# Patient Record
Sex: Female | Born: 1975 | Race: White | Hispanic: No | Marital: Single | Smoking: Current every day smoker
Health system: Southern US, Community
[De-identification: ages and names within clinical notes are randomized; demographics above are authoritative.]

## PROBLEM LIST (undated history)

## (undated) ENCOUNTER — Ambulatory Visit: Disposition: A | Discharge status: Home / Self Care | Payer: 59

## (undated) DIAGNOSIS — H6690 Otitis media, unspecified, unspecified ear: Secondary | ICD-10-CM

## (undated) DIAGNOSIS — F411 Generalized anxiety disorder: Secondary | ICD-10-CM

## (undated) DIAGNOSIS — J0120 Acute ethmoidal sinusitis, unspecified: Secondary | ICD-10-CM

## (undated) DIAGNOSIS — B338 Other specified viral diseases: Secondary | ICD-10-CM

## (undated) DIAGNOSIS — D518 Other vitamin B12 deficiency anemias: Secondary | ICD-10-CM

## (undated) DIAGNOSIS — D510 Vitamin B12 deficiency anemia due to intrinsic factor deficiency: Secondary | ICD-10-CM

## (undated) DIAGNOSIS — I10 Essential (primary) hypertension: Secondary | ICD-10-CM

## (undated) DIAGNOSIS — R5383 Other fatigue: Secondary | ICD-10-CM

## (undated) DIAGNOSIS — E785 Hyperlipidemia, unspecified: Secondary | ICD-10-CM

## (undated) DIAGNOSIS — R221 Localized swelling, mass and lump, neck: Secondary | ICD-10-CM

## (undated) DIAGNOSIS — J069 Acute upper respiratory infection, unspecified: Secondary | ICD-10-CM

## (undated) DIAGNOSIS — E162 Hypoglycemia, unspecified: Secondary | ICD-10-CM

## (undated) DIAGNOSIS — M79609 Pain in unspecified limb: Secondary | ICD-10-CM

## (undated) DIAGNOSIS — J441 Chronic obstructive pulmonary disease with (acute) exacerbation: Secondary | ICD-10-CM

## (undated) DIAGNOSIS — F329 Major depressive disorder, single episode, unspecified: Secondary | ICD-10-CM

## (undated) DIAGNOSIS — I2510 Atherosclerotic heart disease of native coronary artery without angina pectoris: Secondary | ICD-10-CM

## (undated) DIAGNOSIS — R112 Nausea with vomiting, unspecified: Secondary | ICD-10-CM

## (undated) DIAGNOSIS — J0100 Acute maxillary sinusitis, unspecified: Secondary | ICD-10-CM

## (undated) DIAGNOSIS — R079 Chest pain, unspecified: Secondary | ICD-10-CM

## (undated) DIAGNOSIS — M545 Low back pain, unspecified: Secondary | ICD-10-CM

## (undated) DIAGNOSIS — H6190 Disorder of external ear, unspecified, unspecified ear: Secondary | ICD-10-CM

## (undated) DIAGNOSIS — H81319 Aural vertigo, unspecified ear: Secondary | ICD-10-CM

## (undated) DIAGNOSIS — R197 Diarrhea, unspecified: Secondary | ICD-10-CM

## (undated) DIAGNOSIS — G4736 Sleep related hypoventilation in conditions classified elsewhere: Secondary | ICD-10-CM

## (undated) DIAGNOSIS — I471 Supraventricular tachycardia, unspecified: Secondary | ICD-10-CM

## (undated) DIAGNOSIS — K219 Gastro-esophageal reflux disease without esophagitis: Secondary | ICD-10-CM

## (undated) DIAGNOSIS — F32A Depression, unspecified: Secondary | ICD-10-CM

## (undated) DIAGNOSIS — I509 Heart failure, unspecified: Secondary | ICD-10-CM

## (undated) DIAGNOSIS — M7150 Other bursitis, not elsewhere classified, unspecified site: Secondary | ICD-10-CM

## (undated) DIAGNOSIS — R5381 Other malaise: Secondary | ICD-10-CM

## (undated) DIAGNOSIS — L989 Disorder of the skin and subcutaneous tissue, unspecified: Secondary | ICD-10-CM

## (undated) DIAGNOSIS — F17200 Nicotine dependence, unspecified, uncomplicated: Secondary | ICD-10-CM

## (undated) DIAGNOSIS — R220 Localized swelling, mass and lump, head: Secondary | ICD-10-CM

## (undated) HISTORY — DX: Chest pain, unspecified: R07.9

## (undated) HISTORY — DX: Acute ethmoidal sinusitis, unspecified: J01.20

## (undated) HISTORY — DX: Vitamin B12 deficiency anemia due to intrinsic factor deficiency: D51.0

## (undated) HISTORY — DX: Other vitamin B12 deficiency anemias: D51.8

## (undated) HISTORY — DX: Nausea with vomiting, unspecified: R11.2

## (undated) HISTORY — DX: Major depressive disorder, single episode, unspecified: F32.9

## (undated) HISTORY — DX: Atherosclerotic heart disease of native coronary artery without angina pectoris: I25.10

## (undated) HISTORY — DX: Otitis media, unspecified, unspecified ear: H66.90

## (undated) HISTORY — DX: Aural vertigo, unspecified ear: H81.319

## (undated) HISTORY — DX: Other bursitis, not elsewhere classified, unspecified site: M71.50

## (undated) HISTORY — DX: Other specified viral diseases: B33.8

## (undated) HISTORY — DX: Other fatigue: R53.83

## (undated) HISTORY — DX: Supraventricular tachycardia: I47.1

## (undated) HISTORY — DX: Essential (primary) hypertension: I10

## (undated) HISTORY — DX: Localized swelling, mass and lump, neck: R22.1

## (undated) HISTORY — DX: Disorder of the skin and subcutaneous tissue, unspecified: L98.9

## (undated) HISTORY — DX: Sleep related hypoventilation in conditions classified elsewhere: G47.36

## (undated) HISTORY — DX: Generalized anxiety disorder: F41.1

## (undated) HISTORY — DX: Other malaise: R53.81

## (undated) HISTORY — DX: Heart failure, unspecified: I50.9

## (undated) HISTORY — DX: Low back pain, unspecified: M54.50

## (undated) HISTORY — DX: Disorder of external ear, unspecified, unspecified ear: H61.90

## (undated) HISTORY — DX: Acute maxillary sinusitis, unspecified: J01.00

## (undated) HISTORY — DX: Depression, unspecified: F32.A

## (undated) HISTORY — DX: Pain in unspecified limb: M79.609

## (undated) HISTORY — DX: Supraventricular tachycardia, unspecified: I47.10

## (undated) HISTORY — DX: Chronic obstructive pulmonary disease with (acute) exacerbation: J44.1

## (undated) HISTORY — DX: Nicotine dependence, unspecified, uncomplicated: F17.200

## (undated) HISTORY — DX: Diarrhea, unspecified: R19.7

## (undated) HISTORY — DX: Localized swelling, mass and lump, head: R22.0

## (undated) HISTORY — DX: Hypoglycemia, unspecified: E16.2

## (undated) HISTORY — DX: Gastro-esophageal reflux disease without esophagitis: K21.9

## (undated) HISTORY — DX: Low back pain: M54.5

## (undated) HISTORY — DX: Hyperlipidemia, unspecified: E78.5

## (undated) HISTORY — DX: Acute upper respiratory infection, unspecified: J06.9

---

## 2005-01-07 ENCOUNTER — Emergency Department: Payer: Self-pay | Admitting: General Practice

## 2005-08-25 ENCOUNTER — Ambulatory Visit: Payer: Self-pay | Admitting: Family Medicine

## 2005-10-14 ENCOUNTER — Emergency Department: Payer: Self-pay | Admitting: Emergency Medicine

## 2006-01-18 ENCOUNTER — Emergency Department: Payer: Self-pay | Admitting: Unknown Physician Specialty

## 2006-07-21 ENCOUNTER — Emergency Department: Payer: Self-pay | Admitting: Emergency Medicine

## 2007-10-29 ENCOUNTER — Emergency Department: Payer: Self-pay | Admitting: Internal Medicine

## 2008-01-23 ENCOUNTER — Ambulatory Visit: Payer: Self-pay

## 2008-06-23 ENCOUNTER — Ambulatory Visit: Payer: Self-pay | Admitting: Family Medicine

## 2008-07-30 ENCOUNTER — Emergency Department: Payer: Self-pay | Admitting: Emergency Medicine

## 2008-08-05 ENCOUNTER — Encounter: Payer: Self-pay | Admitting: Emergency Medicine

## 2008-08-06 ENCOUNTER — Encounter: Payer: Self-pay | Admitting: Emergency Medicine

## 2008-08-11 ENCOUNTER — Emergency Department: Payer: Self-pay | Admitting: Emergency Medicine

## 2008-08-13 ENCOUNTER — Emergency Department: Payer: Self-pay | Admitting: Emergency Medicine

## 2008-09-05 ENCOUNTER — Encounter: Payer: Self-pay | Admitting: Emergency Medicine

## 2008-09-23 ENCOUNTER — Ambulatory Visit: Payer: Self-pay | Admitting: Family

## 2008-10-13 ENCOUNTER — Other Ambulatory Visit: Payer: Self-pay | Admitting: Family

## 2008-12-10 ENCOUNTER — Other Ambulatory Visit: Payer: Self-pay | Admitting: Family

## 2009-04-27 ENCOUNTER — Emergency Department: Payer: Self-pay | Admitting: Emergency Medicine

## 2009-05-28 ENCOUNTER — Emergency Department: Payer: Self-pay | Admitting: Emergency Medicine

## 2009-07-12 ENCOUNTER — Ambulatory Visit: Payer: Self-pay | Admitting: Family

## 2010-02-05 ENCOUNTER — Emergency Department: Payer: Self-pay | Admitting: Emergency Medicine

## 2010-04-12 ENCOUNTER — Emergency Department: Payer: Self-pay | Admitting: Emergency Medicine

## 2010-04-25 ENCOUNTER — Ambulatory Visit: Payer: Self-pay | Admitting: Otolaryngology

## 2010-06-02 ENCOUNTER — Emergency Department: Payer: Self-pay | Admitting: Internal Medicine

## 2010-09-21 ENCOUNTER — Ambulatory Visit: Payer: Self-pay | Admitting: Internal Medicine

## 2010-10-17 ENCOUNTER — Ambulatory Visit: Payer: Self-pay | Admitting: Internal Medicine

## 2010-11-22 ENCOUNTER — Ambulatory Visit: Payer: Self-pay | Admitting: Otolaryngology

## 2010-12-06 ENCOUNTER — Ambulatory Visit: Payer: Self-pay | Admitting: Otolaryngology

## 2010-12-19 ENCOUNTER — Ambulatory Visit: Payer: Self-pay | Admitting: Otolaryngology

## 2010-12-22 LAB — PATHOLOGY REPORT

## 2011-01-20 ENCOUNTER — Ambulatory Visit (INDEPENDENT_AMBULATORY_CARE_PROVIDER_SITE_OTHER): Payer: Medicaid Other | Admitting: Internal Medicine

## 2011-01-20 DIAGNOSIS — E538 Deficiency of other specified B group vitamins: Secondary | ICD-10-CM

## 2011-01-20 MED ORDER — CYANOCOBALAMIN 1000 MCG/ML IJ SOLN
1000.0000 ug | Freq: Once | INTRAMUSCULAR | Status: AC
  Administered 2011-01-20: 1000 ug via INTRAMUSCULAR

## 2011-01-21 ENCOUNTER — Encounter: Payer: Self-pay | Admitting: Internal Medicine

## 2011-02-15 ENCOUNTER — Encounter: Payer: Self-pay | Admitting: Internal Medicine

## 2011-03-17 ENCOUNTER — Ambulatory Visit: Payer: Self-pay | Admitting: Orthopedic Surgery

## 2011-03-20 ENCOUNTER — Ambulatory Visit: Payer: Self-pay | Admitting: Orthopedic Surgery

## 2011-04-25 ENCOUNTER — Ambulatory Visit: Payer: Self-pay | Admitting: Orthopedic Surgery

## 2011-07-19 DIAGNOSIS — E538 Deficiency of other specified B group vitamins: Secondary | ICD-10-CM | POA: Insufficient documentation

## 2011-07-19 DIAGNOSIS — F32A Depression, unspecified: Secondary | ICD-10-CM | POA: Insufficient documentation

## 2011-07-19 DIAGNOSIS — K219 Gastro-esophageal reflux disease without esophagitis: Secondary | ICD-10-CM | POA: Insufficient documentation

## 2011-07-19 DIAGNOSIS — F411 Generalized anxiety disorder: Secondary | ICD-10-CM | POA: Insufficient documentation

## 2011-07-19 DIAGNOSIS — M545 Low back pain, unspecified: Secondary | ICD-10-CM | POA: Insufficient documentation

## 2011-07-20 ENCOUNTER — Ambulatory Visit: Payer: Self-pay | Admitting: Internal Medicine

## 2011-09-06 ENCOUNTER — Emergency Department: Payer: Self-pay

## 2011-09-06 LAB — TSH: Thyroid Stimulating Horm: 2.64 u[IU]/mL

## 2011-09-06 LAB — CBC
HGB: 15.1 g/dL (ref 12.0–16.0)
MCH: 31.7 pg (ref 26.0–34.0)
MCHC: 33.8 g/dL (ref 32.0–36.0)
MCV: 94 fL (ref 80–100)
Platelet: 317 10*3/uL (ref 150–440)
RBC: 4.77 10*6/uL (ref 3.80–5.20)

## 2011-09-06 LAB — TROPONIN I: Troponin-I: <0.02 ng/mL

## 2011-09-06 LAB — BASIC METABOLIC PANEL
BUN: 6 mg/dL — ABNORMAL LOW (ref 7–18)
Co2: 24 mmol/L (ref 21–32)
Creatinine: 0.73 mg/dL (ref 0.60–1.30)
EGFR (Non-African Amer.): >60
Glucose: 134 mg/dL — ABNORMAL HIGH (ref 65–99)
Osmolality: 277 (ref 275–301)
Sodium: 139 mmol/L (ref 136–145)

## 2011-09-06 LAB — CK TOTAL AND CKMB (NOT AT ARMC): CK-MB: <0.5 ng/mL — ABNORMAL LOW (ref 0.5–3.6)

## 2011-10-04 ENCOUNTER — Other Ambulatory Visit: Payer: Self-pay | Admitting: Internal Medicine

## 2011-10-04 NOTE — Telephone Encounter (Signed)
Patient has not been seen here.  Please advise.

## 2011-11-08 ENCOUNTER — Emergency Department: Payer: Self-pay | Admitting: Emergency Medicine

## 2011-11-12 ENCOUNTER — Emergency Department: Payer: Self-pay | Admitting: Internal Medicine

## 2011-11-29 ENCOUNTER — Other Ambulatory Visit: Payer: Self-pay | Admitting: Specialist

## 2011-11-29 LAB — CBC WITH DIFFERENTIAL/PLATELET
Basophil #: 0 10*3/uL (ref 0.0–0.1)
Eosinophil #: 0.1 10*3/uL (ref 0.0–0.7)
Eosinophil %: 1.1 %
HCT: 44.4 % (ref 35.0–47.0)
MCH: 31.1 pg (ref 26.0–34.0)
MCV: 95 fL (ref 80–100)
Platelet: 274 10*3/uL (ref 150–440)
RBC: 4.7 10*6/uL (ref 3.80–5.20)
WBC: 10.5 10*3/uL (ref 3.6–11.0)

## 2011-11-29 LAB — COMPREHENSIVE METABOLIC PANEL
Albumin: 3.9 g/dL (ref 3.4–5.0)
Alkaline Phosphatase: 85 U/L (ref 50–136)
BUN: 7 mg/dL (ref 7–18)
Bilirubin,Total: 0.5 mg/dL (ref 0.2–1.0)
Calcium, Total: 8.7 mg/dL (ref 8.5–10.1)
EGFR (Non-African Amer.): >60
Glucose: 81 mg/dL (ref 65–99)
Osmolality: 273 (ref 275–301)
Potassium: 3.7 mmol/L (ref 3.5–5.1)
SGPT (ALT): 22 U/L
Sodium: 138 mmol/L (ref 136–145)

## 2011-11-29 LAB — LIPID PANEL: VLDL Cholesterol, Calc: 37 mg/dL (ref 5–40)

## 2011-12-18 ENCOUNTER — Encounter: Payer: Self-pay | Admitting: Specialist

## 2012-01-07 ENCOUNTER — Encounter: Payer: Self-pay | Admitting: Specialist

## 2012-01-17 ENCOUNTER — Emergency Department: Payer: Self-pay | Admitting: Internal Medicine

## 2012-01-17 DIAGNOSIS — M797 Fibromyalgia: Secondary | ICD-10-CM | POA: Insufficient documentation

## 2012-01-17 DIAGNOSIS — IMO0001 Reserved for inherently not codable concepts without codable children: Secondary | ICD-10-CM | POA: Insufficient documentation

## 2012-01-23 ENCOUNTER — Ambulatory Visit: Payer: Self-pay | Admitting: Specialist

## 2012-02-10 ENCOUNTER — Other Ambulatory Visit: Payer: Self-pay | Admitting: Otolaryngology

## 2012-02-12 ENCOUNTER — Ambulatory Visit: Payer: Self-pay | Admitting: Pain Medicine

## 2012-02-15 ENCOUNTER — Ambulatory Visit: Payer: Self-pay | Admitting: Pain Medicine

## 2012-02-23 DIAGNOSIS — R110 Nausea: Secondary | ICD-10-CM | POA: Insufficient documentation

## 2012-03-04 ENCOUNTER — Ambulatory Visit: Payer: Self-pay | Admitting: Pain Medicine

## 2012-03-06 ENCOUNTER — Encounter: Payer: Self-pay | Admitting: Internal Medicine

## 2012-03-21 DIAGNOSIS — H00019 Hordeolum externum unspecified eye, unspecified eyelid: Secondary | ICD-10-CM | POA: Insufficient documentation

## 2012-04-15 ENCOUNTER — Other Ambulatory Visit: Payer: Self-pay | Admitting: Specialist

## 2012-04-15 LAB — HEPATIC FUNCTION PANEL A (ARMC)
Alkaline Phosphatase: 89 U/L (ref 50–136)
Bilirubin, Direct: <0.1 mg/dL (ref 0.00–0.20)
SGPT (ALT): 27 U/L (ref 12–78)

## 2012-06-18 DIAGNOSIS — IMO0001 Reserved for inherently not codable concepts without codable children: Secondary | ICD-10-CM | POA: Insufficient documentation

## 2012-06-18 DIAGNOSIS — R5383 Other fatigue: Secondary | ICD-10-CM | POA: Insufficient documentation

## 2012-07-08 ENCOUNTER — Ambulatory Visit: Payer: Self-pay | Admitting: Pain Medicine

## 2012-07-29 DIAGNOSIS — I499 Cardiac arrhythmia, unspecified: Secondary | ICD-10-CM | POA: Insufficient documentation

## 2012-07-29 DIAGNOSIS — Z309 Encounter for contraceptive management, unspecified: Secondary | ICD-10-CM | POA: Insufficient documentation

## 2012-07-29 DIAGNOSIS — A184 Tuberculosis of skin and subcutaneous tissue: Secondary | ICD-10-CM | POA: Insufficient documentation

## 2012-08-03 ENCOUNTER — Ambulatory Visit: Payer: Self-pay | Admitting: Internal Medicine

## 2012-08-04 ENCOUNTER — Ambulatory Visit: Payer: Self-pay | Admitting: Internal Medicine

## 2012-08-05 LAB — CLOSTRIDIUM DIFFICILE BY PCR

## 2012-08-07 LAB — STOOL CULTURE

## 2012-08-12 ENCOUNTER — Ambulatory Visit: Payer: Self-pay | Admitting: Otolaryngology

## 2012-08-14 ENCOUNTER — Ambulatory Visit: Payer: Self-pay | Admitting: Otolaryngology

## 2012-09-11 DIAGNOSIS — R1313 Dysphagia, pharyngeal phase: Secondary | ICD-10-CM | POA: Insufficient documentation

## 2013-02-03 DIAGNOSIS — T148XXA Other injury of unspecified body region, initial encounter: Secondary | ICD-10-CM | POA: Insufficient documentation

## 2013-02-03 DIAGNOSIS — M25519 Pain in unspecified shoulder: Secondary | ICD-10-CM | POA: Insufficient documentation

## 2013-02-03 DIAGNOSIS — M25569 Pain in unspecified knee: Secondary | ICD-10-CM | POA: Insufficient documentation

## 2013-02-03 DIAGNOSIS — M25562 Pain in left knee: Secondary | ICD-10-CM | POA: Insufficient documentation

## 2013-08-04 DIAGNOSIS — G5600 Carpal tunnel syndrome, unspecified upper limb: Secondary | ICD-10-CM | POA: Insufficient documentation

## 2013-08-04 DIAGNOSIS — M7710 Lateral epicondylitis, unspecified elbow: Secondary | ICD-10-CM | POA: Insufficient documentation

## 2013-10-24 DIAGNOSIS — M26629 Arthralgia of temporomandibular joint, unspecified side: Secondary | ICD-10-CM | POA: Insufficient documentation

## 2014-04-15 ENCOUNTER — Ambulatory Visit: Payer: Self-pay | Admitting: Family Medicine

## 2014-08-05 ENCOUNTER — Ambulatory Visit
Admit: 2014-08-05 | Disposition: A | Discharge status: Home / Self Care | Payer: Self-pay | Attending: Cardiovascular Disease | Admitting: Cardiovascular Disease

## 2014-08-06 ENCOUNTER — Telehealth: Payer: Self-pay | Admitting: Internal Medicine

## 2014-09-16 NOTE — Telephone Encounter (Signed)
Message sent

## 2014-11-03 DIAGNOSIS — Z79899 Other long term (current) drug therapy: Secondary | ICD-10-CM | POA: Insufficient documentation

## 2014-12-23 ENCOUNTER — Other Ambulatory Visit: Payer: Self-pay

## 2014-12-23 ENCOUNTER — Encounter: Payer: Self-pay | Admitting: Emergency Medicine

## 2014-12-23 ENCOUNTER — Emergency Department: Payer: No Typology Code available for payment source

## 2014-12-23 ENCOUNTER — Emergency Department
Admission: EM | Admit: 2014-12-23 | Discharge: 2014-12-23 | Disposition: A | Discharge status: Home / Self Care | Payer: No Typology Code available for payment source | Attending: Student | Admitting: Student

## 2014-12-23 DIAGNOSIS — Z79811 Long term (current) use of aromatase inhibitors: Secondary | ICD-10-CM | POA: Diagnosis not present

## 2014-12-23 DIAGNOSIS — Z720 Tobacco use: Secondary | ICD-10-CM | POA: Insufficient documentation

## 2014-12-23 DIAGNOSIS — F419 Anxiety disorder, unspecified: Secondary | ICD-10-CM | POA: Diagnosis not present

## 2014-12-23 DIAGNOSIS — Z7951 Long term (current) use of inhaled steroids: Secondary | ICD-10-CM | POA: Insufficient documentation

## 2014-12-23 DIAGNOSIS — R0789 Other chest pain: Secondary | ICD-10-CM | POA: Diagnosis not present

## 2014-12-23 DIAGNOSIS — Z79899 Other long term (current) drug therapy: Secondary | ICD-10-CM | POA: Diagnosis not present

## 2014-12-23 DIAGNOSIS — E785 Hyperlipidemia, unspecified: Secondary | ICD-10-CM | POA: Insufficient documentation

## 2014-12-23 DIAGNOSIS — R071 Chest pain on breathing: Secondary | ICD-10-CM

## 2014-12-23 DIAGNOSIS — F329 Major depressive disorder, single episode, unspecified: Secondary | ICD-10-CM | POA: Diagnosis not present

## 2014-12-23 MED ORDER — KETOROLAC TROMETHAMINE 10 MG PO TABS: 10.0000 mg | ORAL_TABLET | Freq: Four times a day (QID) | ORAL | Status: DC | PRN

## 2014-12-23 MED ORDER — KETOROLAC TROMETHAMINE 60 MG/2ML IM SOLN
60.0000 mg | Freq: Once | INTRAMUSCULAR | Status: AC
  Administered 2014-12-23: 60 mg via INTRAMUSCULAR
  Filled 2014-12-23: qty 2

## 2014-12-23 MED ORDER — PSEUDOEPH-BROMPHEN-DM 30-2-10 MG/5ML PO SYRP: 5.0000 mL | ORAL_SOLUTION | Freq: Four times a day (QID) | ORAL | Status: DC | PRN

## 2014-12-23 MED ORDER — ORPHENADRINE CITRATE 30 MG/ML IJ SOLN
60.0000 mg | Freq: Two times a day (BID) | INTRAMUSCULAR | Status: DC
  Administered 2014-12-23: 60 mg via INTRAMUSCULAR
  Filled 2014-12-23: qty 2

## 2014-12-23 MED ORDER — TRAMADOL HCL 50 MG PO TABS: 50.0000 mg | ORAL_TABLET | Freq: Four times a day (QID) | ORAL | Status: AC | PRN

## 2014-12-23 NOTE — ED Provider Notes (Signed)
[facility] Emergency Department Provider Note  ____________________________________________  Time seen: Approximately 9:17 AM  I have reviewed the triage vital signs and the nursing notes.   HISTORY  Chief Complaint Chest Pain   HPI Lisa Davila is a 39 y.o. female complaining Center chest wall pain for 1 day. Patient states pain increase with deep breathing and movement. Patient states she's has also coughing for the last 2-3 days. Patient denies any dyspnea or radiating pain in the center of her chest. Patient rated the pain as a sharp 7/10.   Past Medical History  Diagnosis Date  . Hyperlipidemia   . Depression   . Nausea with vomiting   . Pain in limb   . Hypoglycemia, unspecified   . Tobacco use disorder   . Sleep related hypoventilation/hypoxemia in conditions classifiable elsewhere   . Swelling, mass, or lump in head and neck   . Other and unspecified peripheral vertigo(386.19)   . Unspecified otitis media   . Diarrhea   . Esophageal reflux   . Acute maxillary sinusitis   . Acute upper respiratory infections of unspecified site   . Unspecified disorder of external ear   . Other specified diseases due to viruses   . Unspecified disorder of skin and subcutaneous tissue   . Other malaise and fatigue   . Anxiety state, unspecified   . Other vitamin B12 deficiency anemia   . Other bursitis disorders   . Acute ethmoidal sinusitis   . Acute ethmoidal sinusitis   . Lumbago   . Pernicious anemia   . Obstructive chronic bronchitis with exacerbation   . Generalized anxiety disorder   . Other and unspecified hyperlipidemia   . Chest pain, unspecified     There are no active problems to display for this patient.   History reviewed. No pertinent past surgical history.  Current Outpatient Rx  Name  Route  Sig  Dispense  Refill  . albuterol (PROVENTIL HFA;VENTOLIN HFA) 108 (90 BASE) MCG/ACT inhaler   Inhalation   Inhale 2 puffs into the  lungs every 6 (six) hours as needed.           . ALPRAZolam (XANAX) 0.5 MG tablet   Oral   Take 0.5 mg by mouth 2 (two) times daily.           . brompheniramine-pseudoephedrine-DM 30-2-10 MG/5ML syrup   Oral   Take 5 mLs by mouth 4 (four) times daily as needed.   120 mL   0   . escitalopram (LEXAPRO) 10 MG tablet   Oral   Take 10 mg by mouth daily.           . Fluticasone-Salmeterol (ADVAIR) 100-50 MCG/DOSE AEPB   Inhalation   Inhale 1 puff into the lungs 2 (two) times daily.           Marland Kitchen ketorolac (TORADOL) 10 MG tablet   Oral   Take 1 tablet (10 mg total) by mouth every 6 (six) hours as needed.   20 tablet   0   . MedroxyPROGESTERone Acetate (DEPO-PROVERA IM)   Intramuscular   Inject into the muscle.           . rosuvastatin (CRESTOR) 20 MG tablet   Oral   Take 20 mg by mouth daily.           . traMADol (ULTRAM) 50 MG tablet   Oral   Take 1 tablet (50 mg total) by mouth every 6 (six) hours as needed.  20 tablet   0     Allergies Sulfa drugs cross reactors  Family History  Problem Relation Age of Onset  . Depression Mother   . Cancer Father     Father  . Hypertension Sister   . Cancer Paternal Grandfather     Breast     Social History Social History  Substance Use Topics  . Smoking status: Current Every Day Smoker  . Smokeless tobacco: None     Comment: Has been smoking for 20 years  . Alcohol Use: No    Review of Systems Constitutional: No fever/chills Eyes: No visual changes. ENT: No sore throat. Cardiovascular:  Respiratory: Denies shortness of breath. Gastrointestinal: No abdominal pain.  No nausea, no vomiting.  No diarrhea.  No constipation. Nonproductive cough Genitourinary: Negative for dysuria. Musculoskeletal: Chest wall pain  Skin: Negative for rash. Neurological: Negative for headaches, focal weakness or numbness. Psychiatric:Anxiety and  depression Endocrine:Hyperlipidemia Hematological/Lymphatic: Allergic/Immunilogical: See medication list 10-point ROS otherwise negative.  ____________________________________________   PHYSICAL EXAM:  VITAL SIGNS: ED Triage Vitals  Enc Vitals Group     BP 12/23/14 0812 115/72 mmHg     Pulse Rate 12/23/14 0812 86     Resp 12/23/14 0812 16     Temp 12/23/14 0812 98 F (36.7 C)     Temp Source 12/23/14 0812 Oral     SpO2 12/23/14 0812 100 %     Weight 12/23/14 0812 130 lb (58.968 kg)     Height 12/23/14 0812 5\' 1"  (1.549 m)     Head Cir --      Peak Flow --      Pain Score --      Pain Loc --      Pain Edu? --      Excl. in [place]? --     Constitutional: Alert and oriented. Well appearing and in no acute distress. Eyes: Conjunctivae are normal. PERRL. EOMI. Head: Atraumatic. Nose: No congestion/rhinnorhea. Mouth/Throat: Mucous membranes are moist.  Oropharynx non-erythematous. Neck: No stridor.  No cervical spine tenderness to palpation. Hematological/Lymphatic/Immunilogical: No cervical lymphadenopathy. Cardiovascular: Normal rate, regular rhythm. Grossly normal heart sounds.  Good peripheral circulation. Respiratory: Normal respiratory effort.  No retractions. Lungs CTAB. Gastrointestinal: Soft and nontender. No distention. No abdominal bruits. No CVA tenderness. Musculoskeletal: No lower extremity tenderness nor edema.  No joint effusions. Neurologic:  Normal speech and language. No gross focal neurologic deficits are appreciated. No gait instability. Skin:  Skin is warm, dry and intact. No rash noted. Psychiatric: Mood and affect are normal. Speech and behavior are normal.  ____________________________________________   LABS (all labs ordered are listed, but only abnormal results are displayed)  Labs Reviewed - No data to display ____________________________________________  EKG   ____________________________________________  RADIOLOGY  No acute  findings. ____________________________________________   PROCEDURES  Procedure(s) performed: None  Critical Care performed: No  ____________________________________________   INITIAL IMPRESSION / ASSESSMENT AND PLAN / ED COURSE  Pertinent labs & imaging results that were available during my care of the patient were reviewed by me and considered in my medical decision making (see chart for details). Costochrondal chest pain. Patient state remarkable relief with Toradol and Norflex. Patient given prescription for this medication oral form. Patient advised follow-up family doctor. Return by ER for condition worsens. ____________________________________________   FINAL CLINICAL IMPRESSION(S) / ED DIAGNOSES  Final diagnoses:  Costochondral chest pain      Sable Feil, PA-C 12/23/14 3545  Joanne Gavel, MD 12/23/14 [phone]

## 2014-12-23 NOTE — Discharge Instructions (Signed)
Advised lap belt for 2-3 days. Costochondritis Costochondritis, sometimes called Tietze syndrome, is a swelling and irritation (inflammation) of the tissue (cartilage) that connects your ribs with your breastbone (sternum). It causes pain in the chest and rib area. Costochondritis usually goes away on its own over time. It can take up to 6 weeks or longer to get better, especially if you are unable to limit your activities. CAUSES  Some cases of costochondritis have no known cause. Possible causes include:  Injury (trauma).  Exercise or activity such as lifting.  Severe coughing. SIGNS AND SYMPTOMS  Pain and tenderness in the chest and rib area.  Pain that gets worse when coughing or taking deep breaths.  Pain that gets worse with specific movements. DIAGNOSIS  Your health care provider will do a physical exam and ask about your symptoms. Chest X-rays or other tests may be done to rule out other problems. TREATMENT  Costochondritis usually goes away on its own over time. Your health care provider may prescribe medicine to help relieve pain. HOME CARE INSTRUCTIONS   Avoid exhausting physical activity. Try not to strain your ribs during normal activity. This would include any activities using chest, abdominal, and side muscles, especially if heavy weights are used.  Apply ice to the affected area for the first 2 days after the pain begins.  Put ice in a plastic bag.  Place a towel between your skin and the bag.  Leave the ice on for 20 minutes, 2-3 times a day.  Only take over-the-counter or prescription medicines as directed by your health care provider. SEEK MEDICAL CARE IF:  You have redness or swelling at the rib joints. These are signs of infection.  Your pain does not go away despite rest or medicine. SEEK IMMEDIATE MEDICAL CARE IF:   Your pain increases or you are very uncomfortable.  You have shortness of breath or difficulty breathing.  You cough up blood.  You  have worse chest pains, sweating, or vomiting.  You have a fever or persistent symptoms for more than 2-3 days.  You have a fever and your symptoms suddenly get worse. MAKE SURE YOU:   Understand these instructions.  Will watch your condition.  Will get help right away if you are not doing well or get worse. Document Released: 02/01/2005 Document Revised: 02/12/2013 Document Reviewed: 11/26/2012 [facility] Patient Information 2015 [place], [place]. This information is not intended to replace advice given to you by your health care provider. Make sure you discuss any questions you have with your health care provider.

## 2014-12-23 NOTE — ED Notes (Signed)
Reports cp onset yesterday, worse with deep breathing and movement

## 2015-03-16 DIAGNOSIS — J329 Chronic sinusitis, unspecified: Secondary | ICD-10-CM | POA: Insufficient documentation

## 2015-03-16 DIAGNOSIS — J3489 Other specified disorders of nose and nasal sinuses: Secondary | ICD-10-CM | POA: Insufficient documentation

## 2015-03-25 ENCOUNTER — Other Ambulatory Visit: Payer: Self-pay | Admitting: Otolaryngology

## 2015-03-25 DIAGNOSIS — H905 Unspecified sensorineural hearing loss: Secondary | ICD-10-CM

## 2015-03-31 ENCOUNTER — Other Ambulatory Visit: Payer: Self-pay | Admitting: Internal Medicine

## 2015-03-31 DIAGNOSIS — N6459 Other signs and symptoms in breast: Secondary | ICD-10-CM

## 2015-04-06 ENCOUNTER — Ambulatory Visit: Payer: No Typology Code available for payment source | Attending: Otolaryngology

## 2015-04-16 ENCOUNTER — Ambulatory Visit
Admission: RE | Admit: 2015-04-16 | Discharge: 2015-04-16 | Disposition: A | Discharge status: Home / Self Care | Payer: No Typology Code available for payment source | Source: Ambulatory Visit | Attending: Internal Medicine | Admitting: Internal Medicine

## 2015-04-16 DIAGNOSIS — N6001 Solitary cyst of right breast: Secondary | ICD-10-CM | POA: Insufficient documentation

## 2015-04-16 DIAGNOSIS — N644 Mastodynia: Secondary | ICD-10-CM | POA: Insufficient documentation

## 2015-04-16 DIAGNOSIS — N6459 Other signs and symptoms in breast: Secondary | ICD-10-CM

## 2015-08-05 ENCOUNTER — Ambulatory Visit
Admission: RE | Admit: 2015-08-05 | Discharge: 2015-08-05 | Disposition: A | Discharge status: Home / Self Care | Payer: BLUE CROSS/BLUE SHIELD | Source: Ambulatory Visit | Attending: Physician Assistant | Admitting: Physician Assistant

## 2015-08-05 ENCOUNTER — Other Ambulatory Visit: Payer: Self-pay | Admitting: Physician Assistant

## 2015-08-05 DIAGNOSIS — M79601 Pain in right arm: Secondary | ICD-10-CM | POA: Insufficient documentation

## 2015-08-17 ENCOUNTER — Emergency Department
Admission: EM | Admit: 2015-08-17 | Discharge: 2015-08-17 | Disposition: A | Discharge status: Home / Self Care | Payer: BLUE CROSS/BLUE SHIELD | Attending: Emergency Medicine | Admitting: Emergency Medicine

## 2015-08-17 ENCOUNTER — Emergency Department: Payer: BLUE CROSS/BLUE SHIELD

## 2015-08-17 DIAGNOSIS — W010XXA Fall on same level from slipping, tripping and stumbling without subsequent striking against object, initial encounter: Secondary | ICD-10-CM | POA: Insufficient documentation

## 2015-08-17 DIAGNOSIS — Y999 Unspecified external cause status: Secondary | ICD-10-CM | POA: Insufficient documentation

## 2015-08-17 DIAGNOSIS — Y939 Activity, unspecified: Secondary | ICD-10-CM | POA: Insufficient documentation

## 2015-08-17 DIAGNOSIS — F329 Major depressive disorder, single episode, unspecified: Secondary | ICD-10-CM | POA: Insufficient documentation

## 2015-08-17 DIAGNOSIS — F17200 Nicotine dependence, unspecified, uncomplicated: Secondary | ICD-10-CM | POA: Insufficient documentation

## 2015-08-17 DIAGNOSIS — S60221A Contusion of right hand, initial encounter: Secondary | ICD-10-CM | POA: Insufficient documentation

## 2015-08-17 DIAGNOSIS — Y929 Unspecified place or not applicable: Secondary | ICD-10-CM | POA: Insufficient documentation

## 2015-08-17 DIAGNOSIS — Z79899 Other long term (current) drug therapy: Secondary | ICD-10-CM | POA: Insufficient documentation

## 2015-08-17 DIAGNOSIS — E785 Hyperlipidemia, unspecified: Secondary | ICD-10-CM | POA: Insufficient documentation

## 2015-08-17 MED ORDER — OXYCODONE-ACETAMINOPHEN 5-325 MG PO TABS
1.0000 | ORAL_TABLET | Freq: Once | ORAL | Status: DC
  Filled 2015-08-17: qty 1

## 2015-08-17 MED ORDER — OXYCODONE-ACETAMINOPHEN 5-325 MG PO TABS: 1.0000 | ORAL_TABLET | ORAL | Status: DC | PRN

## 2015-08-17 NOTE — Discharge Instructions (Signed)
Hand Contusion  A hand contusion is a deep bruise on your hand area. Contusions are the result of an injury that caused bleeding under the skin. The contusion may turn blue, purple, or yellow. Minor injuries will give you a painless contusion, but more severe contusions may stay painful and swollen for a few weeks.  CAUSES   A contusion is usually caused by a blow, trauma, or direct force to an area of the body.  SYMPTOMS    Swelling and redness of the injured area.   Discoloration of the injured area.   Tenderness and soreness of the injured area.   Pain.  DIAGNOSIS   The diagnosis can be made by taking a history and performing a physical exam. An X-ray, CT scan, or MRI may be needed to determine if there were any associated injuries, such as broken bones (fractures).  TREATMENT   Often, the best treatment for a hand contusion is resting, elevating, icing, and applying cold compresses to the injured area. Over-the-counter medicines may also be recommended for pain control.  HOME CARE INSTRUCTIONS    Put ice on the injured area.    Put ice in a plastic bag.    Place a towel between your skin and the bag.    Leave the ice on for 15-20 minutes, 03-04 times a day.   Only take over-the-counter or prescription medicines as directed by your caregiver. Your caregiver may recommend avoiding anti-inflammatory medicines (aspirin, ibuprofen, and naproxen) for 48 hours because these medicines may increase bruising.   If told, use an elastic wrap as directed. This can help reduce swelling. You may remove the wrap for sleeping, showering, and bathing. If your fingers become numb, cold, or blue, take the wrap off and reapply it more loosely.   Elevate your hand with pillows to reduce swelling.   Avoid overusing your hand if it is painful.  SEEK IMMEDIATE MEDICAL CARE IF:    You have increased redness, swelling, or pain in your hand.   Your swelling or pain is not relieved with medicines.   You have loss of feeling in  your hand or are unable to move your fingers.   Your hand turns cold or blue.   You have pain when you move your fingers.   Your hand becomes warm to the touch.   Your contusion does not improve in 2 days.  MAKE SURE YOU:    Understand these instructions.   Will watch your condition.   Will get help right away if you are not doing well or get worse.     This information is not intended to replace advice given to you by your health care provider. Make sure you discuss any questions you have with your health care provider.     Document Released: 10/14/2001 Document Revised: 01/17/2012 Document Reviewed: 10/16/2011  Elsevier Interactive Patient Education 2016 [employer]

## 2015-08-17 NOTE — ED Notes (Signed)
Patient ambulatory to triage with steady gait, without difficulty or distress noted; pt reports tripping over dog this morning injuring right 4th and 5th fingers

## 2015-08-17 NOTE — ED Notes (Signed)
Pt reports falling over her dog injuring her 4th and 5th finger and wrist on her right hand.

## 2015-08-17 NOTE — ED Provider Notes (Signed)
[facility] Emergency Department Provider Note  ____________________________________________  Time seen: 6:20 AM  I have reviewed the triage vital signs and the nursing notes.   HISTORY  Chief Complaint Hand Injury     HPI Lisa Davila is a 40 y.o. female presents with history of accidental trip and fall over her dog this morning with resultant 8 out of 10 right hand pain predominantly over the fourth and fifth fingers as well as wrist. He said the pain is worse with any movement     Past Medical History  Diagnosis Date  . Hyperlipidemia   . Depression   . Nausea with vomiting   . Pain in limb   . Hypoglycemia, unspecified   . Tobacco use disorder   . Sleep related hypoventilation/hypoxemia in conditions classifiable elsewhere   . Swelling, mass, or lump in head and neck   . Other and unspecified peripheral vertigo(386.19)   . Unspecified otitis media   . Diarrhea   . Esophageal reflux   . Acute maxillary sinusitis   . Acute upper respiratory infections of unspecified site   . Unspecified disorder of external ear   . Other specified diseases due to viruses   . Unspecified disorder of skin and subcutaneous tissue   . Other malaise and fatigue   . Anxiety state, unspecified   . Other vitamin B12 deficiency anemia   . Other bursitis disorders   . Acute ethmoidal sinusitis   . Acute ethmoidal sinusitis   . Lumbago   . Pernicious anemia   . Obstructive chronic bronchitis with exacerbation ([place])   . Generalized anxiety disorder   . Other and unspecified hyperlipidemia   . Chest pain, unspecified     There are no active problems to display for this patient.   No past surgical history on file.  Current Outpatient Rx  Name  Route  Sig  Dispense  Refill  . albuterol (PROVENTIL HFA;VENTOLIN HFA) 108 (90 BASE) MCG/ACT inhaler   Inhalation   Inhale 2 puffs into the lungs every 6 (six) hours as needed.           . ALPRAZolam (XANAX)  0.5 MG tablet   Oral   Take 0.5 mg by mouth 2 (two) times daily.           . brompheniramine-pseudoephedrine-DM 30-2-10 MG/5ML syrup   Oral   Take 5 mLs by mouth 4 (four) times daily as needed.   120 mL   0   . escitalopram (LEXAPRO) 10 MG tablet   Oral   Take 10 mg by mouth daily.           . Fluticasone-Salmeterol (ADVAIR) 100-50 MCG/DOSE AEPB   Inhalation   Inhale 1 puff into the lungs 2 (two) times daily.           Marland Kitchen ketorolac (TORADOL) 10 MG tablet   Oral   Take 1 tablet (10 mg total) by mouth every 6 (six) hours as needed.   20 tablet   0   . MedroxyPROGESTERone Acetate (DEPO-PROVERA IM)   Intramuscular   Inject into the muscle.           . rosuvastatin (CRESTOR) 20 MG tablet   Oral   Take 20 mg by mouth daily.           . traMADol (ULTRAM) 50 MG tablet   Oral   Take 1 tablet (50 mg total) by mouth every 6 (six) hours as needed.   20 tablet  0     Allergies Sulfa drugs cross reactors  Family History  Problem Relation Age of Onset  . Depression Mother   . Cancer Father     Father  . Hypertension Sister   . Cancer Paternal Grandfather     Breast   . Breast cancer Maternal Grandmother     Social History Social History  Substance Use Topics  . Smoking status: Current Every Day Smoker  . Smokeless tobacco: Not on file     Comment: Has been smoking for 20 years  . Alcohol Use: No    Review of Systems  Constitutional: Negative for fever. Eyes: Negative for visual changes. ENT: Negative for sore throat. Cardiovascular: Negative for chest pain. Respiratory: Negative for shortness of breath. Gastrointestinal: Negative for abdominal pain, vomiting and diarrhea. Genitourinary: Negative for dysuria. Musculoskeletal: Negative for back pain. Positive for right hand and wrist pain Skin: Negative for rash. Neurological: Negative for headaches, focal weakness or numbness.   10-point ROS otherwise  negative.  ____________________________________________   PHYSICAL EXAM:  VITAL SIGNS: ED Triage Vitals  Enc Vitals Group     BP 08/17/15 0614 118/57 mmHg     Pulse Rate 08/17/15 0614 88     Resp 08/17/15 0614 20     Temp 08/17/15 0614 98.2 F (36.8 C)     Temp Source 08/17/15 0614 Oral     SpO2 08/17/15 0614 100 %     Weight 08/17/15 0614 150 lb (68.04 kg)     Height 08/17/15 0614 5\' 1"  (1.549 m)     Head Cir --      Peak Flow --      Pain Score 08/17/15 0614 9     Pain Loc --      Pain Edu? --      Excl. in [place]? --    Constitutional: Alert and oriented. Well appearing and in no distress. Eyes: Conjunctivae are normal. PERRL. Normal extraocular movements. ENT   Head: Normocephalic and atraumatic.   Nose: No congestion/rhinnorhea.   Mouth/Throat: Mucous membranes are moist.   Neck: No stridor. Hematological/Lymphatic/Immunilogical: No cervical lymphadenopathy. Cardiovascular: Normal rate, regular rhythm. Normal and symmetric distal pulses are present in all extremities. No murmurs, rubs, or gallops. Respiratory: Normal respiratory effort without tachypnea nor retractions. Breath sounds are clear and equal bilaterally. No wheezes/rales/rhonchi. Gastrointestinal: Soft and nontender. No distention. There is no CVA tenderness. Genitourinary: deferred Musculoskeletal: Pain with active and passive range of motion of the right wrist as well as palpation over the fourth and fifth MCP joints Neurologic:  Normal speech and language. No gross focal neurologic deficits are appreciated. Speech is normal.  Skin:  Skin is warm, dry and intact. No rash noted. Psychiatric: Mood and affect are normal. Speech and behavior are normal. Patient exhibits appropriate insight and judgment.      RADIOLOGY    DG Hand Complete Right (Final result) Result time: 08/17/15 06:59:03   Final result by Rad Results In Interface (08/17/15 06:59:03)   Narrative:   CLINICAL DATA: Tripped  over dog, landing on outstretched right hand.  EXAM: RIGHT HAND - COMPLETE 3+ VIEW  COMPARISON: None.  FINDINGS: There is no evidence of fracture or dislocation. There is no evidence of arthropathy or other focal bone abnormality. Soft tissues are unremarkable.  IMPRESSION: Negative.   Electronically Signed By: Andreas Newport M.D. On: 08/17/2015 06:59          DG Wrist Complete Right (Final result) Result time: 08/17/15 07:16:02  Final result by Rad Results In Interface (08/17/15 07:16:02)   Narrative:   CLINICAL DATA: Tripped over dog last night, landing on right wrist and hand. Pain in right ring and little fingers.  EXAM: RIGHT WRIST - COMPLETE 3+ VIEW  COMPARISON: None.  FINDINGS: There is no evidence of fracture or dislocation. There is no evidence of arthropathy or other focal bone abnormality. Soft tissues are unremarkable.  IMPRESSION: Negative.   Electronically Signed By: Rolm Baptise M.D. On: 08/17/2015 07:16          INITIAL IMPRESSION / ASSESSMENT AND PLAN / ED COURSE  Pertinent labs & imaging results that were available during my care of the patient were reviewed by me and considered in my medical decision making (see chart for details).    ____________________________________________   FINAL CLINICAL IMPRESSION(S) / ED DIAGNOSES  Final diagnoses:  Hand contusion, right, initial encounter      Gregor Hams, MD 08/18/15 [phone]

## 2016-02-29 ENCOUNTER — Emergency Department
Admission: EM | Admit: 2016-02-29 | Discharge: 2016-02-29 | Disposition: A | Discharge status: Home / Self Care | Payer: Self-pay | Attending: Emergency Medicine | Admitting: Emergency Medicine

## 2016-02-29 ENCOUNTER — Encounter: Payer: Self-pay | Admitting: Emergency Medicine

## 2016-02-29 ENCOUNTER — Emergency Department: Payer: Self-pay

## 2016-02-29 DIAGNOSIS — M5412 Radiculopathy, cervical region: Secondary | ICD-10-CM | POA: Insufficient documentation

## 2016-02-29 DIAGNOSIS — J441 Chronic obstructive pulmonary disease with (acute) exacerbation: Secondary | ICD-10-CM | POA: Insufficient documentation

## 2016-02-29 DIAGNOSIS — X500XXA Overexertion from strenuous movement or load, initial encounter: Secondary | ICD-10-CM | POA: Insufficient documentation

## 2016-02-29 DIAGNOSIS — F418 Other specified anxiety disorders: Secondary | ICD-10-CM | POA: Insufficient documentation

## 2016-02-29 DIAGNOSIS — F17200 Nicotine dependence, unspecified, uncomplicated: Secondary | ICD-10-CM | POA: Insufficient documentation

## 2016-02-29 DIAGNOSIS — Y990 Civilian activity done for income or pay: Secondary | ICD-10-CM | POA: Insufficient documentation

## 2016-02-29 DIAGNOSIS — Z79899 Other long term (current) drug therapy: Secondary | ICD-10-CM | POA: Insufficient documentation

## 2016-02-29 DIAGNOSIS — Y9389 Activity, other specified: Secondary | ICD-10-CM | POA: Insufficient documentation

## 2016-02-29 DIAGNOSIS — Y929 Unspecified place or not applicable: Secondary | ICD-10-CM | POA: Insufficient documentation

## 2016-02-29 MED ORDER — CYCLOBENZAPRINE HCL 10 MG PO TABS: 10.0000 mg | ORAL_TABLET | Freq: Three times a day (TID) | ORAL | 0 refills | Status: DC | PRN

## 2016-02-29 MED ORDER — METHYLPREDNISOLONE 4 MG PO TBPK: ORAL_TABLET | ORAL | 0 refills | Status: DC

## 2016-02-29 MED ORDER — TRAMADOL HCL 50 MG PO TABS: 50.0000 mg | ORAL_TABLET | Freq: Four times a day (QID) | ORAL | 0 refills | Status: DC | PRN

## 2016-02-29 NOTE — Discharge Instructions (Signed)
Wear arm sling while for 3 days.

## 2016-02-29 NOTE — ED Triage Notes (Signed)
Pt with right arm and shoulder pain for over a week now. Pt is a CNA.

## 2016-02-29 NOTE — ED Provider Notes (Signed)
[facility] Emergency Department Provider Note   ____________________________________________   First MD Initiated Contact with Patient 02/29/16 1016     (approximate)  I have reviewed the triage vital signs and the nursing notes.   HISTORY  Chief Complaint Arm Pain    HPI Lisa Davila is a 40 y.o. female patient complaining of radicular pain from the neck to the right upper extremity. Patient state pain has been constant for one week. Patient states she's had previous shoulder pain and has seen her PCP and had negative x-rays done of the right shoulder. Patient stated radicular pain coming from that is a new onset. Patient denies any specific provocative incident. 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 Patient is rating the pain as a 9/10. No palliative measures taken for this complaint.   Past Medical History:  Diagnosis Date  . Acute ethmoidal sinusitis   . Acute ethmoidal sinusitis   . Acute maxillary sinusitis   . Acute upper respiratory infections of unspecified site   . Anxiety state, unspecified   . Chest pain, unspecified   . Depression   . Diarrhea   . Esophageal reflux   . Generalized anxiety disorder   . Hyperlipidemia   . Hypoglycemia, unspecified   . Lumbago   . Nausea with vomiting   . Obstructive chronic bronchitis with exacerbation ([place])   . Other and unspecified hyperlipidemia   . Other and unspecified peripheral vertigo(386.19)   . Other bursitis disorders   . Other malaise and fatigue   . Other specified diseases due to viruses   . Other vitamin B12 deficiency anemia   . Pain in limb   . Pernicious anemia   . Sleep related hypoventilation/hypoxemia in conditions classifiable elsewhere   . Swelling, mass, or lump in head and neck   . Tobacco use disorder   . Unspecified disorder of external ear   . Unspecified disorder of skin and subcutaneous tissue   . Unspecified otitis media      There are no active problems to display for this patient.   History reviewed. No pertinent surgical history.  Prior to Admission medications   Medication Sig Start Date End Date Taking? Authorizing Provider  albuterol (PROVENTIL HFA;VENTOLIN HFA) 108 (90 BASE) MCG/ACT inhaler Inhale 2 puffs into the lungs every 6 (six) hours as needed.      Historical Provider, MD  ALPRAZolam [name]) 0.5 MG tablet Take 0.5 mg by mouth 2 (two) times daily.      Historical Provider, MD  brompheniramine-pseudoephedrine-DM 30-2-10 MG/5ML syrup Take 5 mLs by mouth 4 (four) times daily as needed. 12/23/14   Sable Feil, PA-C  cyclobenzaprine (FLEXERIL) 10 MG tablet Take 1 tablet (10 mg total) by mouth 3 (three) times daily as needed. 02/29/16   Sable Feil, PA-C  escitalopram (LEXAPRO) 10 MG tablet Take 10 mg by mouth daily.      Historical Provider, MD  Fluticasone-Salmeterol (ADVAIR) 100-50 MCG/DOSE AEPB Inhale 1 puff into the lungs 2 (two) times daily.      Historical Provider, MD  ketorolac (TORADOL) 10 MG tablet Take 1 tablet (10 mg total) by mouth every 6 (six) hours as needed. 12/23/14   Sable Feil, PA-C  MedroxyPROGESTERone Acetate (DEPO-PROVERA IM) Inject into the muscle.      Historical Provider, MD  methylPREDNISolone (MEDROL DOSEPAK) 4 MG TBPK tablet Take Tapered dose as directed 02/29/16   Sable Feil, PA-C  oxyCODONE-acetaminophen (  PERCOCET/ROXICET) 5-325 MG tablet Take 1 tablet by mouth every 4 (four) hours as needed for severe pain. 08/17/15   Gregor Hams, MD  rosuvastatin (CRESTOR) 20 MG tablet Take 20 mg by mouth daily.      Historical Provider, MD  traMADol (ULTRAM) 50 MG tablet Take 1 tablet (50 mg total) by mouth every 6 (six) hours as needed. 02/29/16 02/28/17  Sable Feil, PA-C    Allergies Sulfa drugs cross reactors  Family History  Problem Relation Age of Onset  . Depression Mother   . Cancer Father     Father  . Hypertension Sister   . Cancer Paternal  Grandfather     Breast   . Breast cancer Maternal Grandmother     Social History Social History  Substance Use Topics  . Smoking status: Current Every Day Smoker  . Smokeless tobacco: Never Used     Comment: Has been smoking for 20 years  . Alcohol use No    Review of Systems Constitutional: No fever/chills Eyes: No visual changes. ENT: No sore throat. Cardiovascular: Denies chest pain. Respiratory: Denies shortness of breath. Gastrointestinal: No abdominal pain.  No nausea, no vomiting.  No diarrhea.  No constipation. Genitourinary: Negative for dysuria. Musculoskeletal: Right upper extremity pain Skin: Negative for rash. Neurological: Negative for headaches, focal weakness or numbness. Psychiatric:Anxiety and depression. Endocrine:Hyperlipidemia Hematological/Lymphatic: Allergic/Immunilogical: Sulfa ___________________________________________   PHYSICAL EXAM:  VITAL SIGNS: ED Triage Vitals  Enc Vitals Group     BP 02/29/16 0952 123/82     Pulse Rate 02/29/16 0952 89     Resp 02/29/16 0952 16     Temp 02/29/16 0952 98 F (36.7 C)     Temp Source 02/29/16 0952 Oral     SpO2 02/29/16 0952 100 %     Weight 02/29/16 0952 132 lb (59.9 kg)     Height 02/29/16 0952 5\' 1"  (1.549 m)     Head Circumference --      Peak Flow --      Pain Score 02/29/16 1011 9     Pain Loc --      Pain Edu? --      Excl. in [place]? --     Constitutional: Alert and oriented. Well appearing and in no acute distress. Eyes: Conjunctivae are normal. PERRL. EOMI. Head: Atraumatic. Nose: No congestion/rhinnorhea. Mouth/Throat: Mucous membranes are moist.  Oropharynx non-erythematous. Neck: No stridor.  No cervical spine tenderness to palpation. Hematological/Lymphatic/Immunilogical: No cervical lymphadenopathy. Cardiovascular: Normal rate, regular rhythm. Grossly normal heart sounds.  Good peripheral circulation. Respiratory: Normal respiratory effort.  No retractions. Lungs  CTAB. Gastrointestinal: Soft and nontender. No distention. No abdominal bruits. No CVA tenderness. Musculoskeletal: No lower extremity tenderness nor edema.  No joint effusions. Neurologic:  Normal speech and language. No gross focal neurologic deficits are appreciated. No gait instability. Skin:  Skin is warm, dry and intact. No rash noted. Psychiatric: Mood and affect are normal. Speech and behavior are normal.  ____________________________________________   LABS (all labs ordered are listed, but only abnormal results are displayed)  Labs Reviewed - No data to display ____________________________________________  EKG   ____________________________________________  RADIOLOGY   ____________________________________________   PROCEDURES  Procedure(s) performed: None  Procedures  Critical Care performed: No  ____________________________________________   INITIAL IMPRESSION / ASSESSMENT AND PLAN / ED COURSE  Pertinent labs & imaging results that were available during my care of the patient were reviewed by me and considered in my medical decision making (see chart for  details).  Cervical radiculopathy to the right upper extremity. Discussed negative x-ray finding with patient. Patient given discharge care instructions. Patient given prescription for Medrol Dosepak, Flexeril, and tramadol. Patient advised follow-up family doctor if no improvement in one week.  Clinical Course  Patient placed in arm sling   ____________________________________________   FINAL CLINICAL IMPRESSION(S) / ED DIAGNOSES  Final diagnoses:  Cervical radicular pain      NEW MEDICATIONS STARTED DURING THIS VISIT:  New Prescriptions   CYCLOBENZAPRINE (FLEXERIL) 10 MG TABLET    Take 1 tablet (10 mg total) by mouth 3 (three) times daily as needed.   METHYLPREDNISOLONE (MEDROL DOSEPAK) 4 MG TBPK TABLET    Take Tapered dose as directed   TRAMADOL (ULTRAM) 50 MG TABLET    Take 1 tablet (50 mg  total) by mouth every 6 (six) hours as needed.     Note:  This document was prepared using Dragon voice recognition software and may include unintentional dictation errors.    Sable Feil, PA-C 02/29/16 1114    Lisa Roca, MD 02/29/16 [phone]

## 2016-03-21 ENCOUNTER — Emergency Department
Admission: EM | Admit: 2016-03-21 | Discharge: 2016-03-21 | Disposition: A | Discharge status: Home / Self Care | Payer: No Typology Code available for payment source | Attending: Emergency Medicine | Admitting: Emergency Medicine

## 2016-03-21 ENCOUNTER — Encounter: Payer: Self-pay | Admitting: *Deleted

## 2016-03-21 DIAGNOSIS — J449 Chronic obstructive pulmonary disease, unspecified: Secondary | ICD-10-CM | POA: Insufficient documentation

## 2016-03-21 DIAGNOSIS — X500XXA Overexertion from strenuous movement or load, initial encounter: Secondary | ICD-10-CM | POA: Insufficient documentation

## 2016-03-21 DIAGNOSIS — Y929 Unspecified place or not applicable: Secondary | ICD-10-CM | POA: Insufficient documentation

## 2016-03-21 DIAGNOSIS — Y9389 Activity, other specified: Secondary | ICD-10-CM | POA: Insufficient documentation

## 2016-03-21 DIAGNOSIS — Y990 Civilian activity done for income or pay: Secondary | ICD-10-CM | POA: Insufficient documentation

## 2016-03-21 DIAGNOSIS — F17200 Nicotine dependence, unspecified, uncomplicated: Secondary | ICD-10-CM | POA: Insufficient documentation

## 2016-03-21 DIAGNOSIS — S39012A Strain of muscle, fascia and tendon of lower back, initial encounter: Secondary | ICD-10-CM | POA: Insufficient documentation

## 2016-03-21 DIAGNOSIS — Z79899 Other long term (current) drug therapy: Secondary | ICD-10-CM | POA: Insufficient documentation

## 2016-03-21 LAB — CBC
HEMATOCRIT: 44.6 % (ref 35.0–47.0)
HEMOGLOBIN: 15.1 g/dL (ref 12.0–16.0)
MCH: 32.3 pg (ref 26.0–34.0)
MCHC: 34 g/dL (ref 32.0–36.0)
MCV: 94.9 fL (ref 80.0–100.0)
Platelets: 325 10*3/uL (ref 150–440)
RBC: 4.69 MIL/uL (ref 3.80–5.20)
RDW: 13.1 % (ref 11.5–14.5)
WBC: 10.9 10*3/uL (ref 3.6–11.0)

## 2016-03-21 LAB — COMPREHENSIVE METABOLIC PANEL
ALBUMIN: 4.4 g/dL (ref 3.5–5.0)
ALT: 13 U/L — ABNORMAL LOW (ref 14–54)
ANION GAP: 8 (ref 5–15)
AST: 18 U/L (ref 15–41)
Alkaline Phosphatase: 70 U/L (ref 38–126)
BUN: 6 mg/dL (ref 6–20)
CHLORIDE: 105 mmol/L (ref 101–111)
CO2: 24 mmol/L (ref 22–32)
Calcium: 8.9 mg/dL (ref 8.9–10.3)
Creatinine, Ser: 0.73 mg/dL (ref 0.44–1.00)
GFR calc Af Amer: >60 mL/min (ref >60)
GFR calc non Af Amer: >60 mL/min (ref >60)
GLUCOSE: 90 mg/dL (ref 65–99)
POTASSIUM: 3.2 mmol/L — AB (ref 3.5–5.1)
SODIUM: 137 mmol/L (ref 135–145)
TOTAL PROTEIN: 7.6 g/dL (ref 6.5–8.1)
Total Bilirubin: 0.5 mg/dL (ref 0.3–1.2)

## 2016-03-21 LAB — URINALYSIS COMPLETE WITH MICROSCOPIC (ARMC ONLY)
Bilirubin Urine: NEGATIVE
GLUCOSE, UA: NEGATIVE mg/dL
HGB URINE DIPSTICK: NEGATIVE
Ketones, ur: NEGATIVE mg/dL
Leukocytes, UA: NEGATIVE
NITRITE: NEGATIVE
Protein, ur: NEGATIVE mg/dL
Specific Gravity, Urine: 1.002 — ABNORMAL LOW (ref 1.005–1.030)
pH: 6 (ref 5.0–8.0)

## 2016-03-21 LAB — POCT PREGNANCY, URINE: PREG TEST UR: NEGATIVE

## 2016-03-21 LAB — LIPASE, BLOOD: Lipase: 26 U/L (ref 11–51)

## 2016-03-21 MED ORDER — HYDROCODONE-ACETAMINOPHEN 5-325 MG PO TABS: 1.0000 | ORAL_TABLET | Freq: Four times a day (QID) | ORAL | 0 refills | Status: DC | PRN

## 2016-03-21 MED ORDER — NAPROXEN 500 MG PO TABS: 500.0000 mg | ORAL_TABLET | Freq: Two times a day (BID) | ORAL | 0 refills | Status: DC

## 2016-03-21 MED ORDER — METHOCARBAMOL 500 MG PO TABS: 500.0000 mg | ORAL_TABLET | Freq: Four times a day (QID) | ORAL | 0 refills | Status: DC | PRN

## 2016-03-21 MED ORDER — POTASSIUM CHLORIDE CRYS ER 20 MEQ PO TBCR
40.0000 meq | EXTENDED_RELEASE_TABLET | Freq: Once | ORAL | Status: AC
  Administered 2016-03-21: 40 meq via ORAL
  Filled 2016-03-21: qty 2

## 2016-03-21 NOTE — ED Notes (Signed)
D&C Iv. Catheter was intact. Pt tolerated well.

## 2016-03-21 NOTE — ED Triage Notes (Signed)
Pt complains of left flank pain with dysuria, pt denies fever

## 2016-03-21 NOTE — Discharge Instructions (Signed)
Take naprosyn for pain.   Take robaxin for muscle spasms.   Take vicodin for severe pain.   See your doctor. See ortho for possible injection if you still have pain   Return to ER if you have worse pain, weakness, numbness, unable to walk.

## 2016-03-21 NOTE — ED Provider Notes (Signed)
[facility] Provider Note   CSN: [id number] Arrival date & time: 03/21/16  [id number]     History   Chief Complaint Chief Complaint  Patient presents with  . Flank Pain    HPI Lisa Davila is a 40 y.o. female hx of fibromyalgia, Chronic back pain here with L lower back pain. 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 About 3 weeks ago she was seen in the ER for right shoulder pain and was thought to have radiculopathy and was given Flexeril, Medrol Dosepak, tramadol. She states that the steroid has helped her pain but when she got off the steroids are pink and back. She states that the Flexeril and tramadol did not help her pain. She has no primary care doctor right now and she doesn't have insurance until January. The last 2 days she noticed left lower back pain. Her initial complaint was flank pain but she denies any flank pain to me or any hematuria or dysuria. Denies any fevers or chills or vomiting. Denies fall or injury. Denies hx of kidney stones   The history is provided by the patient.    Past Medical History:  Diagnosis Date  . Acute ethmoidal sinusitis   . Acute ethmoidal sinusitis   . Acute maxillary sinusitis   . Acute upper respiratory infections of unspecified site   . Anxiety state, unspecified   . Chest pain, unspecified   . Depression   . Diarrhea   . Esophageal reflux   . Generalized anxiety disorder   . Hyperlipidemia   . Hypoglycemia, unspecified   . Lumbago   . Nausea with vomiting   . Obstructive chronic bronchitis with exacerbation ([place])   . Other and unspecified hyperlipidemia   . Other and unspecified peripheral vertigo(386.19)   . Other bursitis disorders   . Other malaise and fatigue   . Other specified diseases due to viruses   . Other vitamin B12 deficiency anemia   . Pain in limb   . Pernicious anemia   . Sleep related hypoventilation/hypoxemia in conditions classifiable elsewhere   .  Swelling, mass, or lump in head and neck   . Tobacco use disorder   . Unspecified disorder of external ear   . Unspecified disorder of skin and subcutaneous tissue   . Unspecified otitis media     There are no active problems to display for this patient.   History reviewed. No pertinent surgical history.  OB History    No data available       Home Medications    Prior to Admission medications   Medication Sig Start Date End Date Taking? Authorizing Provider  albuterol (PROVENTIL HFA;VENTOLIN HFA) 108 (90 BASE) MCG/ACT inhaler Inhale 2 puffs into the lungs every 6 (six) hours as needed.     Yes Historical Provider, MD  clonazePAM (KLONOPIN) 0.5 MG tablet Take 0.5 mg by mouth 2 (two) times daily as needed for anxiety.   Yes Historical Provider, MD  cyclobenzaprine (FLEXERIL) 10 MG tablet Take 1 tablet (10 mg total) by mouth 3 (three) times daily as needed. 02/29/16  Yes Sable Feil, PA-C  metoprolol succinate (TOPROL-XL) 50 MG 24 hr tablet Take 50 mg by mouth 2 (two) times daily. Take with or immediately following a meal.   Yes Historical Provider, MD  MedroxyPROGESTERone Acetate (DEPO-PROVERA IM) Inject into the muscle.      Historical Provider, MD    Family History Family History  Problem  Relation Age of Onset  . Depression Mother   . Cancer Father     Father  . Hypertension Sister   . Cancer Paternal Grandfather     Breast   . Breast cancer Maternal Grandmother     Social History Social History  Substance Use Topics  . Smoking status: Current Every Day Smoker  . Smokeless tobacco: Never Used     Comment: Has been smoking for 20 years  . Alcohol use No     Allergies   Contrast media [iodinated diagnostic agents] and Sulfa drugs cross reactors   Review of Systems Review of Systems  Musculoskeletal: Positive for back pain.  All other systems reviewed and are negative.    Physical Exam Updated Vital Signs BP 100/65   Pulse 84   Temp 98.1 F (36.7 C)  (Oral)   Resp 20   Ht 5\' 1"  (1.549 m)   Wt 140 lb (63.5 kg)   SpO2 97%   BMI 26.45 kg/m   Physical Exam  Constitutional: She is oriented to person, place, and time. She appears well-developed.  Slightly uncomfortable   HENT:  Head: Normocephalic.  Eyes: EOM are normal. Pupils are equal, round, and reactive to light.  Neck: Normal range of motion. Neck supple.  Cardiovascular: Normal rate, regular rhythm and normal heart sounds.   Pulmonary/Chest: Effort normal and breath sounds normal. No respiratory distress. She has no wheezes. She has no rales.  Abdominal: Soft. Bowel sounds are normal. She exhibits no distension. There is no tenderness. There is no guarding.  Musculoskeletal:  Tenderness L SI joint, no obvious midline tenderness. Nl ROM L hip. Neurovascular intact. Mild R trapezius tenderness, no midline neck or thoracic spine tenderness   Neurological: She is alert and oriented to person, place, and time. No cranial nerve deficit. Coordination normal.  Nl gait, no saddle anesthesia   Skin: Skin is warm.  Psychiatric: She has a normal mood and affect.  Nursing note and vitals reviewed.    ED Treatments / Results  Labs (all labs ordered are listed, but only abnormal results are displayed) Labs Reviewed  COMPREHENSIVE METABOLIC PANEL - Abnormal; Notable for the following:       Result Value   Potassium 3.2 (*)    ALT 13 (*)    All other components within normal limits  LIPASE, BLOOD  CBC  URINALYSIS COMPLETEWITH MICROSCOPIC ([facility] ONLY)  POCT PREGNANCY, URINE  POC URINE PREG, ED    EKG  EKG Interpretation None       Radiology No results found.  Procedures Procedures (including critical care time)  Medications Ordered in ED Medications  potassium chloride SA (K-DUR,KLOR-CON) CR tablet 40 mEq (40 mEq Oral Given 03/21/16 1112)     Initial Impression / Assessment and Plan / ED Course  I have reviewed the triage vital signs and the nursing  notes.  Pertinent labs & imaging results that were available during my care of the patient were reviewed by me and considered in my medical decision making (see chart for details).  Clinical Course    ,[name] is a 40 y.o. female here with L back pain. No trauma, neurovascular intact. I wanted to get xrays to look for arthritis but she refused. She had recent medrol dosepack and flexeril and ultram that didn't help. Has fibromyalgia but unable to tolerate lyrica in the past. Told her that I only give a couple of hydrocodone. Recommend NSAIDs, robaxin, hydrocodone. She doesn't have insurance but I  told her that once she does, she should see ortho for possible steroid injection    Final Clinical Impressions(s) / ED Diagnoses   Final diagnoses:  None    New Prescriptions New Prescriptions   No medications on file     Drenda Freeze, MD 03/21/16 1118

## 2016-05-03 DIAGNOSIS — F1390 Sedative, hypnotic, or anxiolytic use, unspecified, uncomplicated: Secondary | ICD-10-CM | POA: Insufficient documentation

## 2016-05-03 DIAGNOSIS — Z124 Encounter for screening for malignant neoplasm of cervix: Secondary | ICD-10-CM | POA: Insufficient documentation

## 2016-05-19 DIAGNOSIS — J111 Influenza due to unidentified influenza virus with other respiratory manifestations: Secondary | ICD-10-CM | POA: Insufficient documentation

## 2016-05-19 DIAGNOSIS — J449 Chronic obstructive pulmonary disease, unspecified: Secondary | ICD-10-CM | POA: Diagnosis not present

## 2016-05-19 DIAGNOSIS — F17210 Nicotine dependence, cigarettes, uncomplicated: Secondary | ICD-10-CM | POA: Insufficient documentation

## 2016-05-19 DIAGNOSIS — Z79899 Other long term (current) drug therapy: Secondary | ICD-10-CM | POA: Diagnosis not present

## 2016-05-19 DIAGNOSIS — R509 Fever, unspecified: Secondary | ICD-10-CM | POA: Diagnosis present

## 2016-05-20 ENCOUNTER — Emergency Department
Admission: EM | Admit: 2016-05-20 | Discharge: 2016-05-20 | Disposition: A | Discharge status: Home / Self Care | Payer: BLUE CROSS/BLUE SHIELD | Attending: Emergency Medicine | Admitting: Emergency Medicine

## 2016-05-20 DIAGNOSIS — J111 Influenza due to unidentified influenza virus with other respiratory manifestations: Secondary | ICD-10-CM

## 2016-05-20 LAB — INFLUENZA PANEL BY PCR (TYPE A & B)
INFLAPCR: POSITIVE — AB
Influenza B By PCR: NEGATIVE

## 2016-05-20 MED ORDER — OSELTAMIVIR PHOSPHATE 75 MG PO CAPS
75.0000 mg | ORAL_CAPSULE | Freq: Once | ORAL | Status: AC
  Administered 2016-05-20: 75 mg via ORAL
  Filled 2016-05-20: qty 1

## 2016-05-20 MED ORDER — BENZONATATE 100 MG PO CAPS: 100.0000 mg | ORAL_CAPSULE | Freq: Four times a day (QID) | ORAL | 0 refills | Status: DC | PRN

## 2016-05-20 MED ORDER — OSELTAMIVIR PHOSPHATE 75 MG PO CAPS: 75.0000 mg | ORAL_CAPSULE | Freq: Two times a day (BID) | ORAL | 0 refills | Status: AC

## 2016-05-20 NOTE — ED Provider Notes (Signed)
[facility] Emergency Department Provider Note   First MD Initiated Contact with Patient 05/20/16 0245     (approximate)  I have reviewed the triage vital signs and the nursing notes.   HISTORY  Chief Complaint Cough; Fever; and Influenza    HPI Lisa Davila is a 41 y.o. female with bolus of chronic medical conditions, patient also admits to pack and a half day of cigarette usage. Presents to the emergency department with 1 day history of generalized body aches congestion and nonproductive cough with a temperature at home of 101.9.   Past Medical History:  Diagnosis Date  . Acute ethmoidal sinusitis   . Acute ethmoidal sinusitis   . Acute maxillary sinusitis   . Acute upper respiratory infections of unspecified site   . Anxiety state, unspecified   . Chest pain, unspecified   . Depression   . Diarrhea   . Esophageal reflux   . Generalized anxiety disorder   . Hyperlipidemia   . Hypoglycemia, unspecified   . Lumbago   . Nausea with vomiting   . Obstructive chronic bronchitis with exacerbation ([place])   . Other and unspecified hyperlipidemia   . Other and unspecified peripheral vertigo(386.19)   . Other bursitis disorders   . Other malaise and fatigue   . Other specified diseases due to viruses   . Other vitamin B12 deficiency anemia   . Pain in limb   . Pernicious anemia   . Sleep related hypoventilation/hypoxemia in conditions classifiable elsewhere   . Swelling, mass, or lump in head and neck   . Tobacco use disorder   . Unspecified disorder of external ear   . Unspecified disorder of skin and subcutaneous tissue   . Unspecified otitis media     There are no active problems to display for this patient.   No past surgical history on file.  Prior to Admission medications   Medication Sig Start Date End Date Taking? Authorizing Provider  albuterol (PROVENTIL HFA;VENTOLIN HFA) 108 (90 BASE) MCG/ACT inhaler Inhale 2 puffs into the  lungs every 6 (six) hours as needed.      Historical Provider, MD  clonazePAM (KLONOPIN) 0.5 MG tablet Take 0.5 mg by mouth 2 (two) times daily as needed for anxiety.    Historical Provider, MD  cyclobenzaprine (FLEXERIL) 10 MG tablet Take 1 tablet (10 mg total) by mouth 3 (three) times daily as needed. 02/29/16   Sable Feil, PA-C  HYDROcodone-acetaminophen (NORCO/VICODIN) 5-325 MG tablet Take 1 tablet by mouth every 6 (six) hours as needed for moderate pain. 03/21/16   Drenda Freeze, MD  MedroxyPROGESTERone Acetate (DEPO-PROVERA IM) Inject into the muscle.      Historical Provider, MD  methocarbamol (ROBAXIN) 500 MG tablet Take 1 tablet (500 mg total) by mouth every 6 (six) hours as needed for muscle spasms. 03/21/16   Drenda Freeze, MD  metoprolol succinate (TOPROL-XL) 50 MG 24 hr tablet Take 50 mg by mouth 2 (two) times daily. Take with or immediately following a meal.    Historical Provider, MD  naproxen (NAPROSYN) 500 MG tablet Take 1 tablet (500 mg total) by mouth 2 (two) times daily with a meal. 03/21/16 03/21/17  Drenda Freeze, MD    Allergies Contrast media [iodinated diagnostic agents] and Sulfa drugs cross reactors  Family History  Problem Relation Age of Onset  . Depression Mother   . Cancer Father     Father  . Hypertension Sister   . Cancer  Paternal Grandfather     Breast   . Breast cancer Maternal Grandmother     Social History Social History  Substance Use Topics  . Smoking status: Current Every Day Smoker  . Smokeless tobacco: Never Used     Comment: Has been smoking for 20 years  . Alcohol use No    Review of Systems Constitutional: Positive for fever/chills Eyes: No visual changes. ENT: No sore throat.Positive for nasal congestion Cardiovascular: Denies chest pain. Respiratory: Denies shortness of breath. Positive for cough Gastrointestinal: No abdominal pain.  No nausea, no vomiting.  No diarrhea.  No constipation. Genitourinary: Negative  for dysuria. Musculoskeletal: Negative for back pain. Skin: Negative for rash. Neurological: Negative for headaches, focal weakness or numbness.  10-point ROS otherwise negative.  ____________________________________________   PHYSICAL EXAM:  VITAL SIGNS: ED Triage Vitals  Enc Vitals Group     BP 05/20/16 0005 133/89     Pulse Rate 05/20/16 0005 (!) 124     Resp 05/20/16 0005 20     Temp 05/20/16 0005 99.5 F (37.5 C)     Temp Source 05/20/16 0005 Oral     SpO2 05/20/16 0005 100 %     Weight 05/20/16 0005 130 lb (59 kg)     Height 05/20/16 0005 5\' 1"  (1.549 m)     Head Circumference --      Peak Flow --      Pain Score 05/20/16 0006 10     Pain Loc --      Pain Edu? --      Excl. in [place]? --     Constitutional: Alert and oriented. Well appearing and in no acute distress. Eyes: Conjunctivae are normal. PERRL. EOMI. Head: Atraumatic. Ears:  Healthy appearing ear canals and TMs bilaterally Nose: Positive for congestion/rhinnorhea. Mouth/Throat: Mucous membranes are moist.  Oropharynx non-erythematous. Neck: No stridor.   Cardiovascular: Normal rate, regular rhythm. Good peripheral circulation. Grossly normal heart sounds. Respiratory: Normal respiratory effort.  No retractions. Lungs CTAB. Gastrointestinal: Soft and nontender. No distention.  Musculoskeletal: No lower extremity tenderness nor edema. No gross deformities of extremities. Neurologic:  Normal speech and language. No gross focal neurologic deficits are appreciated.  Skin:  Skin is warm, dry and intact. No rash noted.   ____________________________________________   LABS (all labs ordered are listed, but only abnormal results are displayed)  Labs Reviewed  INFLUENZA PANEL BY PCR (TYPE A & B, H1N1) - Abnormal; Notable for the following:       Result Value   Influenza A By PCR POSITIVE (*)    All other components within normal limits      Procedures    INITIAL IMPRESSION / ASSESSMENT AND PLAN / ED  COURSE  Pertinent labs & imaging results that were available during my care of the patient were reviewed by me and considered in my medical decision making (see chart for details).  41 year old female presents to the emergency department with congestion and fever generalized myalgia considered system with possible influenza which was confirmed with nasal swab. Patient given Tamiflu and will be prescribed same for home   Clinical Course     ____________________________________________  FINAL CLINICAL IMPRESSION(S) / ED DIAGNOSES  Final diagnoses:  Influenza     MEDICATIONS GIVEN DURING THIS VISIT:  Medications  oseltamivir (TAMIFLU) capsule 75 mg (not administered)     NEW OUTPATIENT MEDICATIONS STARTED DURING THIS VISIT:  New Prescriptions   No medications on file    Modified Medications   No  medications on file    Discontinued Medications   No medications on file     Note:  This document was prepared using Dragon voice recognition software and may include unintentional dictation errors.    Gregor Hams, MD 05/20/16 [phone]

## 2016-05-20 NOTE — ED Notes (Signed)
Collected FLU PCR specimen and sent to lab

## 2016-05-20 NOTE — ED Notes (Signed)
Request pt to wear a mask due to her symptoms. Pt states she has claustrophobia and cannot wear a mask. Pt asked to sit in the are reserved for pt with flu symptoms and is agreeable to that.

## 2016-05-20 NOTE — ED Triage Notes (Signed)
Pt ambulatory to triage with no difficulty. Pt reports she has been having a cough for several weeks. Today she developed nasal congestion and body aches. Pt also reports she had a fever of 101.9 at home. Pt took tylenol 30 mins prior to arrival.

## 2016-10-31 ENCOUNTER — Emergency Department
Admission: EM | Admit: 2016-10-31 | Discharge: 2016-10-31 | Disposition: A | Discharge status: Home / Self Care | Payer: Self-pay | Attending: Emergency Medicine | Admitting: Emergency Medicine

## 2016-10-31 ENCOUNTER — Emergency Department: Payer: Self-pay

## 2016-10-31 ENCOUNTER — Encounter: Payer: Self-pay | Admitting: Emergency Medicine

## 2016-10-31 DIAGNOSIS — Y9389 Activity, other specified: Secondary | ICD-10-CM | POA: Insufficient documentation

## 2016-10-31 DIAGNOSIS — M79641 Pain in right hand: Secondary | ICD-10-CM

## 2016-10-31 DIAGNOSIS — Y9289 Other specified places as the place of occurrence of the external cause: Secondary | ICD-10-CM | POA: Insufficient documentation

## 2016-10-31 DIAGNOSIS — S63501A Unspecified sprain of right wrist, initial encounter: Secondary | ICD-10-CM | POA: Insufficient documentation

## 2016-10-31 DIAGNOSIS — Y990 Civilian activity done for income or pay: Secondary | ICD-10-CM | POA: Insufficient documentation

## 2016-10-31 DIAGNOSIS — W010XXA Fall on same level from slipping, tripping and stumbling without subsequent striking against object, initial encounter: Secondary | ICD-10-CM | POA: Insufficient documentation

## 2016-10-31 DIAGNOSIS — F17200 Nicotine dependence, unspecified, uncomplicated: Secondary | ICD-10-CM | POA: Insufficient documentation

## 2016-10-31 MED ORDER — HYDROCODONE-ACETAMINOPHEN 5-325 MG PO TABS: 1.0000 | ORAL_TABLET | ORAL | 0 refills | Status: DC | PRN

## 2016-10-31 MED ORDER — IBUPROFEN 600 MG PO TABS: 600.0000 mg | ORAL_TABLET | Freq: Three times a day (TID) | ORAL | 0 refills | Status: DC | PRN

## 2016-10-31 NOTE — Discharge Instructions (Signed)
Ice and elevate as needed for pain. Ibuprofen every 6 hours with food as needed for pain and inflammation. Norco for pain. This medication cannot be taken while driving or working. Wear Jones wrap for support. Follow-up with your doctor if any continued problems.

## 2016-10-31 NOTE — ED Notes (Addendum)
See triage note  States she slipped  [address] and landed on right hand  Having pain to right 2nd and 3th fingers and into wrist area  Hand is very tender to touch  Positive pulses

## 2016-10-31 NOTE — ED Triage Notes (Signed)
Pt reports slipped and fell at work. Right wrist pain. Ambulatory to triage. Reports this is NOT worker comp.

## 2016-10-31 NOTE — ED Provider Notes (Signed)
[facility] Emergency Department Provider Note   ____________________________________________   First MD Initiated Contact with Patient 10/31/16 [phone]     (approximate)  I have reviewed the triage vital signs and the nursing notes.   HISTORY  Chief Complaint Wrist Pain    HPI Lisa Davila is a 41 y.o. female is here after she slipped at work and fell. She complains of right wrist pain and also pain to her thumb and index finger. She denies any head injury or loss of consciousness. She does not want to claim Workmen's Comp. for this injury. Currently she rates her pain as 10 over 10. Patient drove herself to the department and has already taken Tylenol for pain.   Past Medical History:  Diagnosis Date  . Acute ethmoidal sinusitis   . Acute ethmoidal sinusitis   . Acute maxillary sinusitis   . Acute upper respiratory infections of unspecified site   . Anxiety state, unspecified   . Chest pain, unspecified   . Depression   . Diarrhea   . Esophageal reflux   . Generalized anxiety disorder   . Hyperlipidemia   . Hypoglycemia, unspecified   . Lumbago   . Nausea with vomiting   . Obstructive chronic bronchitis with exacerbation ([place])   . Other and unspecified hyperlipidemia   . Other and unspecified peripheral vertigo(386.19)   . Other bursitis disorders   . Other malaise and fatigue   . Other specified diseases due to viruses   . Other vitamin B12 deficiency anemia   . Pain in limb   . Pernicious anemia   . Sleep related hypoventilation/hypoxemia in conditions classifiable elsewhere   . Swelling, mass, or lump in head and neck   . Tobacco use disorder   . Unspecified disorder of external ear   . Unspecified disorder of skin and subcutaneous tissue   . Unspecified otitis media     There are no active problems to display for this patient.   History reviewed. No pertinent surgical history.  Prior to Admission medications   Medication  Sig Start Date End Date Taking? Authorizing Provider  albuterol (PROVENTIL HFA;VENTOLIN HFA) 108 (90 BASE) MCG/ACT inhaler Inhale 2 puffs into the lungs every 6 (six) hours as needed.      [provider]  clonazePAM (KLONOPIN) 0.5 MG tablet Take 0.5 mg by mouth 2 (two) times daily as needed for anxiety.    [provider]  HYDROcodone-acetaminophen (NORCO/VICODIN) 5-325 MG tablet Take 1 tablet by mouth every 4 (four) hours as needed for moderate pain. 10/31/16   [name], PA-C  ibuprofen (ADVIL,MOTRIN) 600 MG tablet Take 1 tablet (600 mg total) by mouth every 8 (eight) hours as needed. 10/31/16   [name], PA-C  MedroxyPROGESTERone Acetate (DEPO-PROVERA IM) Inject into the muscle.      [provider]  metoprolol succinate (TOPROL-XL) 50 MG 24 hr tablet Take 50 mg by mouth 2 (two) times daily. Take with or immediately following a meal.    [provider]    Allergies Contrast media [iodinated diagnostic agents] and Sulfa drugs cross reactors  Family History  Problem Relation Age of Onset  . Depression Mother   . Cancer Father        Father  . Hypertension Sister   . Cancer Paternal Grandfather        Breast   . Breast cancer Maternal Grandmother     Social History Social History  Substance Use Topics  .  Smoking status: Current Every Day Smoker  . Smokeless tobacco: Never Used     Comment: Has been smoking for 20 years  . Alcohol use No    Review of Systems Constitutional: No fever/chills Cardiovascular: Denies chest pain. Respiratory: Denies shortness of breath. Musculoskeletal: Positive for right hand pain/wrist pain. Skin: Negative for rash. Neurological: Negative for focal weakness or numbness.   ____________________________________________   PHYSICAL EXAM:  VITAL SIGNS: ED Triage Vitals  Enc Vitals Group     BP 10/31/16 0835 125/80     Pulse Rate 10/31/16 0835 82     Resp 10/31/16 0835 18     Temp 10/31/16  0835 98.5 F (36.9 C)     Temp Source 10/31/16 0835 Oral     SpO2 10/31/16 0835 100 %     Weight --      Height --      Head Circumference --      Peak Flow --      Pain Score 10/31/16 0834 10     Pain Loc --      Pain Edu? --      Excl. in [place]? --     Constitutional: Alert and oriented. Well appearing and in no acute distress. Eyes: Conjunctivae are normal. PERRL. EOMI. Head: Atraumatic. Neck: No stridor.   Cardiovascular: Normal rate, regular rhythm. Grossly normal heart sounds.  Good peripheral circulation. Respiratory: Normal respiratory effort.  No retractions. Lungs CTAB. Musculoskeletal: On examination of the right hand there is no gross deformity noted. There is marked tenderness on palpation of the wrist and right hand. Patient is unwilling to allow palpation of the digits especially the thumb and index finger. Range of motion is restricted secondary to patient's pain. Skin is intact. No discoloration is noted. No abrasions seen. No soft tissue swelling present. Neurologic:  Normal speech and language. No gross focal neurologic deficits are appreciated. No gait instability. Skin:  Skin is warm, dry and intact. No abrasions or ecchymosis. Psychiatric: Mood and affect are normal. Speech and behavior are normal.  ____________________________________________   LABS (all labs ordered are listed, but only abnormal results are displayed)  Labs Reviewed - No data to display  RADIOLOGY  Dg Hand Complete Right  Result Date: 10/31/2016 CLINICAL DATA:  States she slipped [place] and landed on right hand Having pain to right 2nd and 3rd fingers and into wrist area Hand is very tender to touch EXAM: RIGHT HAND - COMPLETE 3+ VIEW COMPARISON:  None. FINDINGS: No fracture or dislocation. Joint spaces are preserved. No erosions. No evidence of chondrocalcinosis. Regional soft tissues appear normal. No radiopaque foreign body. IMPRESSION: Normal radiographs of the right hand. Electronically  Signed   By: Sandi Mariscal M.D.   On: 10/31/2016 09:08    ____________________________________________   PROCEDURES  Procedure(s) performed: None  Procedures  Critical Care performed: No  ____________________________________________   INITIAL IMPRESSION / ASSESSMENT AND PLAN / ED COURSE  Pertinent labs & imaging results that were available during my care of the patient were reviewed by me and considered in my medical decision making (see chart for details).  Patient was reassured that x-rays were negative for fracture or dislocation. She was given a prescription for Norco one every 4 hours as needed for pain and ibuprofen 600 mg 3 times a day with food. She is to follow-up with her PCP if any continued problems. Prior to discharge she was wrapped with a Jones wrap to support her thumb and index finger. She  is encouraged to elevate her hand to decrease swelling and help with pain. 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   ____________________________________________   FINAL CLINICAL IMPRESSION(S) / ED DIAGNOSES  Final diagnoses:  Sprain of right wrist, initial encounter  Right hand pain      NEW MEDICATIONS STARTED DURING THIS VISIT:  Discharge Medication List as of 10/31/2016  9:35 AM    START taking these medications   Details  ibuprofen (ADVIL,MOTRIN) 600 MG tablet Take 1 tablet (600 mg total) by mouth every 8 (eight) hours as needed., Starting Tue 10/31/2016, Print         Note:  This document was prepared using Dragon voice recognition software and may include unintentional dictation errors.    [name], PA-C 10/31/16 1522    Earleen Newport, MD 11/02/16 [phone]

## 2016-11-30 DIAGNOSIS — M7062 Trochanteric bursitis, left hip: Secondary | ICD-10-CM | POA: Insufficient documentation

## 2017-01-01 ENCOUNTER — Other Ambulatory Visit: Payer: Self-pay | Admitting: Internal Medicine

## 2017-01-01 DIAGNOSIS — G8929 Other chronic pain: Secondary | ICD-10-CM

## 2017-01-01 DIAGNOSIS — M25511 Pain in right shoulder: Principal | ICD-10-CM

## 2017-01-18 ENCOUNTER — Ambulatory Visit: Admission: RE | Admit: 2017-01-18 | Payer: BLUE CROSS/BLUE SHIELD | Source: Ambulatory Visit

## 2017-02-21 ENCOUNTER — Ambulatory Visit
Admission: RE | Admit: 2017-02-21 | Discharge: 2017-02-21 | Disposition: A | Discharge status: Home / Self Care | Payer: BC Managed Care – PPO | Source: Ambulatory Visit | Attending: Internal Medicine | Admitting: Internal Medicine

## 2017-02-21 DIAGNOSIS — G8929 Other chronic pain: Secondary | ICD-10-CM | POA: Diagnosis not present

## 2017-02-21 DIAGNOSIS — M25511 Pain in right shoulder: Secondary | ICD-10-CM

## 2017-02-21 DIAGNOSIS — M75101 Unspecified rotator cuff tear or rupture of right shoulder, not specified as traumatic: Secondary | ICD-10-CM | POA: Insufficient documentation

## 2017-02-21 DIAGNOSIS — M19011 Primary osteoarthritis, right shoulder: Secondary | ICD-10-CM | POA: Diagnosis not present

## 2017-03-19 ENCOUNTER — Other Ambulatory Visit: Payer: Self-pay | Admitting: Internal Medicine

## 2017-03-19 DIAGNOSIS — M542 Cervicalgia: Secondary | ICD-10-CM

## 2017-03-28 ENCOUNTER — Ambulatory Visit: Admission: RE | Admit: 2017-03-28 | Payer: BC Managed Care – PPO | Source: Ambulatory Visit

## 2017-05-03 ENCOUNTER — Other Ambulatory Visit: Payer: Self-pay | Admitting: Otolaryngology

## 2017-05-03 ENCOUNTER — Other Ambulatory Visit: Payer: Self-pay

## 2017-05-03 ENCOUNTER — Emergency Department
Admission: EM | Admit: 2017-05-03 | Discharge: 2017-05-03 | Disposition: A | Discharge status: Home / Self Care | Payer: BC Managed Care – PPO | Attending: Emergency Medicine | Admitting: Emergency Medicine

## 2017-05-03 DIAGNOSIS — Z79899 Other long term (current) drug therapy: Secondary | ICD-10-CM | POA: Diagnosis not present

## 2017-05-03 DIAGNOSIS — J302 Other seasonal allergic rhinitis: Secondary | ICD-10-CM | POA: Insufficient documentation

## 2017-05-03 DIAGNOSIS — F17200 Nicotine dependence, unspecified, uncomplicated: Secondary | ICD-10-CM | POA: Diagnosis not present

## 2017-05-03 DIAGNOSIS — H6983 Other specified disorders of Eustachian tube, bilateral: Secondary | ICD-10-CM

## 2017-05-03 DIAGNOSIS — M6281 Muscle weakness (generalized): Secondary | ICD-10-CM

## 2017-05-03 DIAGNOSIS — H90A21 Sensorineural hearing loss, unilateral, right ear, with restricted hearing on the contralateral side: Secondary | ICD-10-CM

## 2017-05-03 DIAGNOSIS — H6121 Impacted cerumen, right ear: Secondary | ICD-10-CM | POA: Diagnosis not present

## 2017-05-03 DIAGNOSIS — H9190 Unspecified hearing loss, unspecified ear: Secondary | ICD-10-CM | POA: Diagnosis present

## 2017-05-03 DIAGNOSIS — H6993 Unspecified Eustachian tube disorder, bilateral: Secondary | ICD-10-CM | POA: Diagnosis not present

## 2017-05-03 MED ORDER — DEXAMETHASONE SODIUM PHOSPHATE 10 MG/ML IJ SOLN
10.0000 mg | Freq: Once | INTRAMUSCULAR | Status: AC
  Administered 2017-05-03: 10 mg via INTRAMUSCULAR
  Filled 2017-05-03: qty 1

## 2017-05-03 MED ORDER — METHYLPREDNISOLONE 4 MG PO TBPK
ORAL_TABLET | ORAL | 0 refills | Status: DC
Start: 2017-05-03 — End: 2017-09-10

## 2017-05-03 MED ORDER — BENZONATATE 100 MG PO CAPS: 100.0000 mg | ORAL_CAPSULE | Freq: Three times a day (TID) | ORAL | 0 refills | Status: DC | PRN

## 2017-05-03 MED ORDER — FEXOFENADINE-PSEUDOEPHED ER 60-120 MG PO TB12: 1.0000 | ORAL_TABLET | Freq: Two times a day (BID) | ORAL | 0 refills | Status: DC

## 2017-05-03 NOTE — ED Notes (Signed)
See triage note.states she is having pain to right ear feels like her ear is clogged up  Has been seen by pcp and placed on ear drops and antibiotics

## 2017-05-03 NOTE — ED Triage Notes (Signed)
Pt c/o having sinus issues since September and has been to her PCP for it and was referred to ENT due to wax build up in the ears but has not scheduled an appt yet.. Pt c/o decreased hearing in the right ear.Marland Kitchen

## 2017-05-03 NOTE — ED Provider Notes (Signed)
[facility] Emergency Department Provider Note   ____________________________________________   First MD Initiated Contact with Patient 05/03/17 [phone]     (approximate)  I have reviewed the triage vital signs and the nursing notes.   HISTORY  Chief Complaint Hearing Problem    HPI Lisa Davila is a 40 y.o. female she complained of decreased hearing which she believes secondary to wax pole. Ears. Patient also has sinus issues since September this year. Patient states she's been on 3 courses of antibiotics with no definitive relief. Patient states she's been referred to ENT irrigation. Patient denies fever or chills with this complaint. Patient is currently on antibiotics at this time. Patient denies pain but states she has sinus congestion. Patient has productive cough. Patient denies nausea, vomiting, diarrhea.   Past Medical History:  Diagnosis Date  . Acute ethmoidal sinusitis   . Acute ethmoidal sinusitis   . Acute maxillary sinusitis   . Acute upper respiratory infections of unspecified site   . Anxiety state, unspecified   . Chest pain, unspecified   . Depression   . Diarrhea   . Esophageal reflux   . Generalized anxiety disorder   . Hyperlipidemia   . Hypoglycemia, unspecified   . Lumbago   . Nausea with vomiting   . Obstructive chronic bronchitis with exacerbation ([place])   . Other and unspecified hyperlipidemia   . Other and unspecified peripheral vertigo(386.19)   . Other bursitis disorders   . Other malaise and fatigue   . Other specified diseases due to viruses   . Other vitamin B12 deficiency anemia   . Pain in limb   . Pernicious anemia   . Sleep related hypoventilation/hypoxemia in conditions classifiable elsewhere   . Swelling, mass, or lump in head and neck   . Tobacco use disorder   . Unspecified disorder of external ear   . Unspecified disorder of skin and subcutaneous tissue   . Unspecified otitis media     There  are no active problems to display for this patient.   History reviewed. No pertinent surgical history.  Prior to Admission medications   Medication Sig Start Date End Date Taking? Authorizing Provider  albuterol (PROVENTIL HFA;VENTOLIN HFA) 108 (90 BASE) MCG/ACT inhaler Inhale 2 puffs into the lungs every 6 (six) hours as needed.      [provider]  benzonatate (TESSALON PERLES) 100 MG capsule Take 1 capsule (100 mg total) by mouth 3 (three) times daily as needed for cough. 05/03/17 05/03/18  Sable Feil, PA-C  clonazePAM (KLONOPIN) 0.5 MG tablet Take 0.5 mg by mouth 2 (two) times daily as needed for anxiety.    [provider]  fexofenadine-pseudoephedrine (ALLEGRA-D) 60-120 MG 12 hr tablet Take 1 tablet by mouth 2 (two) times daily. 05/03/17   Sable Feil, PA-C  HYDROcodone-acetaminophen (NORCO/VICODIN) 5-325 MG tablet Take 1 tablet by mouth every 4 (four) hours as needed for moderate pain. 10/31/16   [name], PA-C  ibuprofen (ADVIL,MOTRIN) 600 MG tablet Take 1 tablet (600 mg total) by mouth every 8 (eight) hours as needed. 10/31/16   [name], PA-C  MedroxyPROGESTERone Acetate (DEPO-PROVERA IM) Inject into the muscle.      [provider]  methylPREDNISolone (MEDROL DOSEPAK) 4 MG TBPK tablet Take Tapered dose as directed 05/03/17   Sable Feil, PA-C  metoprolol succinate (TOPROL-XL) 50 MG 24 hr tablet Take 50 mg by mouth 2 (two) times daily. Take with or immediately following a  meal.    [provider]    Allergies   Family History  Problem Relation Age of Onset  . Depression Mother   . Cancer Father        Father  . Hypertension Sister   . Cancer Paternal Grandfather        Breast   . Breast cancer Maternal Grandmother     Social History Social History   Tobacco Use  . Smoking status: Current Every Day Smoker  . Smokeless tobacco: Never Used  . Tobacco comment: Has been smoking for 20 years  Substance Use  Topics  . Alcohol use: No  . Drug use: Not on file    Review of Systems Constitutional: No fever/chills Eyes: No visual changes. ENT: Sinus congestion and increased tearing. Cardiovascular: Denies chest pain. Respiratory: Denies shortness of breath. Productive cough Gastrointestinal: No abdominal pain.  No nausea, no vomiting.  No diarrhea.  No constipation. Genitourinary: Negative for dysuria. Musculoskeletal: Negative for back pain. Skin: Negative for rash. Neurological: Negative for headaches, focal weakness or numbness. Psychiatric:Anxiety and  Endocrine:Hyperlipidemia Hematological/Lymphatic:B-12 deficiency   ____________________________________________   PHYSICAL EXAM:  VITAL SIGNS: ED Triage Vitals [05/03/17 0811]  Enc Vitals Group     BP 120/80     Pulse Rate 86     Resp 18     Temp 98.1 F (36.7 C)     Temp Source Oral     SpO2 100 %     Weight 130 lb (59 kg)     Height 5\' 1"  (1.549 m)     Head Circumference      Peak Flow      Pain Score 3     Pain Loc      Pain Edu?      Excl. in [place]?    Constitutional: Alert and oriented. Well appearing and in no acute distress. Nose: Edematous nasal turbinates bilateral maxillary guarding. EARS: Cerumen right ear and edematous TM Mouth/Throat: Mucous membranes are moist.  Oropharynx non-erythematous. Neck: No stridor. Hematological/Lymphatic/Immunilogical: No cervical lymphadenopathy. Cardiovascular: Normal rate, regular rhythm. Grossly normal heart sounds.  Good peripheral circulation. Respiratory: Normal respiratory effort.  No retractions. Lungs CTAB. Gastrointestinal: Soft and nontender. No distention. No abdominal bruits. No CVA tenderness. Musculoskeletal: No lower extremity tenderness nor edema.  No joint effusions. Neurologic:  Normal speech and language. No gross focal neurologic deficits are appreciated. No gait instability. Skin:  Skin is warm, dry and intact. No rash noted. Psychiatric: Mood and affect  are normal. Speech and behavior are normal.  ____________________________________________   LABS (all labs ordered are listed, but only abnormal results are displayed)  Labs Reviewed - No data to display ____________________________________________  EKG   ____________________________________________  RADIOLOGY  No results found.  ____________________________________________   PROCEDURES  Procedure(s) performed: None  Procedures  Critical Care performed: No  ____________________________________________   INITIAL IMPRESSION / ASSESSMENT AND PLAN / ED COURSE  As part of my medical decision making, I reviewed the following data within the electronic MEDICAL RECORD NUMBER    Sinus congestion secondary to allergic rhinitis. Eustachian tube dysfunction secondary to congestion. Decreased hearing right ear secondary to cerebral impaction. Patient right ear was irrigated with only mild improvement in hearing. Patient given discharge care instructions advised to take medication as directed. Patient advised to follow-up for scheduled ENT appointment.      ____________________________________________   FINAL CLINICAL IMPRESSION(S) / ED DIAGNOSES  Final diagnoses:  Seasonal allergic rhinitis, unspecified trigger  Dysfunction of both  eustachian tubes  Hearing loss of right ear due to cerumen impaction     ED Discharge Orders        Ordered    fexofenadine-pseudoephedrine (ALLEGRA-D) 60-120 MG 12 hr tablet  2 times daily     05/03/17 0916    methylPREDNISolone (MEDROL DOSEPAK) 4 MG TBPK tablet     05/03/17 0916    benzonatate (TESSALON PERLES) 100 MG capsule  3 times daily PRN     05/03/17 8527       Note:  This document was prepared using Dragon voice recognition software and may include unintentional dictation errors.    Sable Feil, PA-C 05/03/17 7824    Lavonia Drafts, MD 05/03/17 [phone]

## 2017-05-09 ENCOUNTER — Other Ambulatory Visit: Payer: Self-pay | Admitting: Internal Medicine

## 2017-05-09 DIAGNOSIS — M6281 Muscle weakness (generalized): Secondary | ICD-10-CM

## 2017-05-11 ENCOUNTER — Ambulatory Visit: Payer: BC Managed Care – PPO

## 2017-08-31 ENCOUNTER — Other Ambulatory Visit: Payer: Self-pay | Admitting: Internal Medicine

## 2017-08-31 DIAGNOSIS — Z1231 Encounter for screening mammogram for malignant neoplasm of breast: Secondary | ICD-10-CM

## 2017-09-10 ENCOUNTER — Emergency Department: Payer: BC Managed Care – PPO

## 2017-09-10 ENCOUNTER — Encounter: Payer: Self-pay | Admitting: Emergency Medicine

## 2017-09-10 ENCOUNTER — Emergency Department
Admission: EM | Admit: 2017-09-10 | Discharge: 2017-09-10 | Disposition: A | Discharge status: Home / Self Care | Payer: BC Managed Care – PPO | Attending: Emergency Medicine | Admitting: Emergency Medicine

## 2017-09-10 DIAGNOSIS — Z79899 Other long term (current) drug therapy: Secondary | ICD-10-CM | POA: Insufficient documentation

## 2017-09-10 DIAGNOSIS — F17200 Nicotine dependence, unspecified, uncomplicated: Secondary | ICD-10-CM | POA: Insufficient documentation

## 2017-09-10 DIAGNOSIS — M779 Enthesopathy, unspecified: Secondary | ICD-10-CM | POA: Diagnosis not present

## 2017-09-10 DIAGNOSIS — M79641 Pain in right hand: Secondary | ICD-10-CM | POA: Diagnosis present

## 2017-09-10 DIAGNOSIS — M778 Other enthesopathies, not elsewhere classified: Secondary | ICD-10-CM

## 2017-09-10 MED ORDER — PREDNISONE 10 MG PO TABS: ORAL_TABLET | ORAL | 0 refills | Status: DC

## 2017-09-10 NOTE — ED Provider Notes (Signed)
[facility] Emergency Department Provider Note  ____________________________________________   First MD Initiated Contact with Patient 09/10/17 [phone]     (approximate)  I have reviewed the triage vital signs and the nursing notes.   HISTORY  Chief Complaint Hand Pain   HPI Lisa Davila is a 42 y.o. female is here with complaint of pain in her right hand for the last week.  Patient is unaware of any injury.  She states she does a lot of lifting at work and is constantly using her hands.  She has taken some over-the-counter anti-inflammatories without any relief.   Past Medical History:  Diagnosis Date  . Acute ethmoidal sinusitis   . Acute ethmoidal sinusitis   . Acute maxillary sinusitis   . Acute upper respiratory infections of unspecified site   . Anxiety state, unspecified   . Chest pain, unspecified   . Depression   . Diarrhea   . Esophageal reflux   . Generalized anxiety disorder   . Hyperlipidemia   . Hypoglycemia, unspecified   . Lumbago   . Nausea with vomiting   . Obstructive chronic bronchitis with exacerbation ([place])   . Other and unspecified hyperlipidemia   . Other and unspecified peripheral vertigo(386.19)   . Other bursitis disorders   . Other malaise and fatigue   . Other specified diseases due to viruses   . Other vitamin B12 deficiency anemia   . Pain in limb   . Pernicious anemia   . Sleep related hypoventilation/hypoxemia in conditions classifiable elsewhere   . Swelling, mass, or lump in head and neck   . Tobacco use disorder   . Unspecified disorder of external ear   . Unspecified disorder of skin and subcutaneous tissue   . Unspecified otitis media     There are no active problems to display for this patient.   History reviewed. No pertinent surgical history.  Prior to Admission medications   Medication Sig Start Date End Date Taking? Authorizing Provider  ALPRAZolam [name]) 0.5 MG tablet Take 0.5 mg by  mouth at bedtime as needed for anxiety.   Yes [provider]  albuterol (PROVENTIL HFA;VENTOLIN HFA) 108 (90 BASE) MCG/ACT inhaler Inhale 2 puffs into the lungs every 6 (six) hours as needed.      [provider]  MedroxyPROGESTERone Acetate (DEPO-PROVERA IM) Inject into the muscle.      [provider]  metoprolol succinate (TOPROL-XL) 50 MG 24 hr tablet Take 50 mg by mouth 2 (two) times daily. Take with or immediately following a meal.    [provider]  predniSONE (DELTASONE) 10 MG tablet Take 6 tablets  today, on day 2 take 5 tablets, day 3 take 4 tablets, day 4 take 3 tablets, day 5 take  2 tablets and 1 tablet the last day 09/10/17   [name], PA-C    Allergies Contrast media [iodinated diagnostic agents] and Sulfa drugs cross reactors  Family History  Problem Relation Age of Onset  . Depression Mother   . Cancer Father        Father  . Hypertension Sister   . Cancer Paternal Grandfather        Breast   . Breast cancer Maternal Grandmother     Social History Social History   Tobacco Use  . Smoking status: Current Every Day Smoker  . Smokeless tobacco: Never Used  . Tobacco comment: Has been smoking for 20 years  Substance Use Topics  . Alcohol  use: No  . Drug use: Not on file    Review of Systems Constitutional: No fever/chills Cardiovascular: Denies chest pain. Respiratory: Denies shortness of breath. Musculoskeletal: Positive for right hand pain in particular right index finger. Skin: Negative for rash. Neurological: Negative for  focal weakness or numbness. ___________________________________________   PHYSICAL EXAM:  VITAL SIGNS: ED Triage Vitals [09/10/17 0831]  Enc Vitals Group     BP 111/73     Pulse Rate 70     Resp 18     Temp 98.1 F (36.7 C)     Temp Source Oral     SpO2 98 %     Weight      Height      Head Circumference      Peak Flow      Pain Score      Pain Loc      Pain Edu?      Excl. in  [place]?    Constitutional: Alert and oriented. Well appearing and in no acute distress. Eyes: Conjunctivae are normal.  Head: Atraumatic. Neck: No stridor.   Cardiovascular: Normal rate, regular rhythm. Grossly normal heart sounds.  Good peripheral circulation. Respiratory: Normal respiratory effort.  No retractions. Lungs CTAB. Musculoskeletal: Examination of the right hand there is no gross deformity however the index finger is slightly edematous without erythema or ecchymosis.  Skin is intact.  There is no point tenderness on palpation and patient is able to flex and extend however this increases her pain.  Capillary refill is less than 3 seconds. Neurologic:  Normal speech and language. No gross focal neurologic deficits are appreciated.  Skin:  Skin is warm, dry and intact.  Psychiatric: Mood and affect are normal. Speech and behavior are normal.  ____________________________________________   LABS (all labs ordered are listed, but only abnormal results are displayed)  Labs Reviewed - No data to display  RADIOLOGY  ED MD interpretation:  Right hand x-ray is negative for fracture or dislocation.  Official radiology report(s): Dg Hand Complete Right  Result Date: 09/10/2017 CLINICAL DATA:  Right hand pain and swelling in the second ray. No reported injury. EXAM: RIGHT HAND - COMPLETE 3+ VIEW COMPARISON:  None. FINDINGS: Soft tissue swelling in the proximal right second finger. No fracture or dislocation. No suspicious focal osseous lesion. No osseous erosions or periosteal reaction. No significant arthropathy. No radiopaque foreign body. IMPRESSION: Soft tissue swelling in the proximal right second finger. No osseous abnormality. Electronically Signed   By: [name] M.D.   On: 09/10/2017 10:02    ____________________________________________   PROCEDURES  Procedure(s) performed:   .Splint Application Date/Time: 11/12/6765 1:44 PM Performed by: Luciana Axe, NT Authorized  by: [name], PA-C   Consent:    Consent obtained:  Verbal   Consent given by:  Patient   Alternatives discussed:  Referral Pre-procedure details:    Sensation:  Normal Procedure details:    Laterality:  Right   Location:  Finger   Finger:  R index finger   Supplies:  Aluminum splint Post-procedure details:    Pain:  Unchanged   Sensation:  Normal   Patient tolerance of procedure:  Tolerated well, no immediate complications    Critical Care performed: No  ____________________________________________   INITIAL IMPRESSION / ASSESSMENT AND PLAN / ED COURSE  As part of my medical decision making, I reviewed the following data within the electronic MEDICAL RECORD NUMBER Notes from prior ED visits and [place] Controlled Substance Database  Patient was made aware to discontinue taking the ibuprofen and will start on prednisone as she is not getting any relief of her symptoms taking over-the-counter medication.  She was placed in a metal finger splint for support.  If she is not improving in 4 to 5 days she is to call make an appointment with Dr. Harlow Mares who is the orthopedist on-call.  She was given information about tendinitis.  She was also given a note for work letting them know that she has limited use of her right hand at this time.  ____________________________________________   FINAL CLINICAL IMPRESSION(S) / ED DIAGNOSES  Final diagnoses:  Tendonitis of finger     ED Discharge Orders        Ordered    predniSONE (DELTASONE) 10 MG tablet     09/10/17 1011       Note:  This document was prepared using Dragon voice recognition software and may include unintentional dictation errors.    [name], PA-C 09/10/17 1353    Harvest Dark, MD 09/10/17 1357

## 2017-09-10 NOTE — ED Triage Notes (Signed)
Presents with pain to right hand last week   States pain started w/o any known injury

## 2017-09-10 NOTE — Discharge Instructions (Addendum)
Wear splint for support and protection.  Begin taking prednisone today as directed. You may use ice or heat to your finger as needed for comfort.  If not improving in 4 to 5 days call make an appointment with Dr. Harlow Mares who is on-call for orthopedics.

## 2018-01-08 DIAGNOSIS — Z566 Other physical and mental strain related to work: Secondary | ICD-10-CM | POA: Insufficient documentation

## 2018-01-08 DIAGNOSIS — R002 Palpitations: Secondary | ICD-10-CM | POA: Insufficient documentation

## 2018-01-08 DIAGNOSIS — E7800 Pure hypercholesterolemia, unspecified: Secondary | ICD-10-CM | POA: Insufficient documentation

## 2018-01-13 IMAGING — CR DG CERVICAL SPINE COMPLETE 4+V
5 series · 5 of 5 positions shown · non-contrast
Comparison: None.

CLINICAL DATA: Neck pain for 2 weeks. Pain down right arm. No known
injury.

EXAM:
CERVICAL SPINE - COMPLETE 4+ VIEW

[c-spine lat]
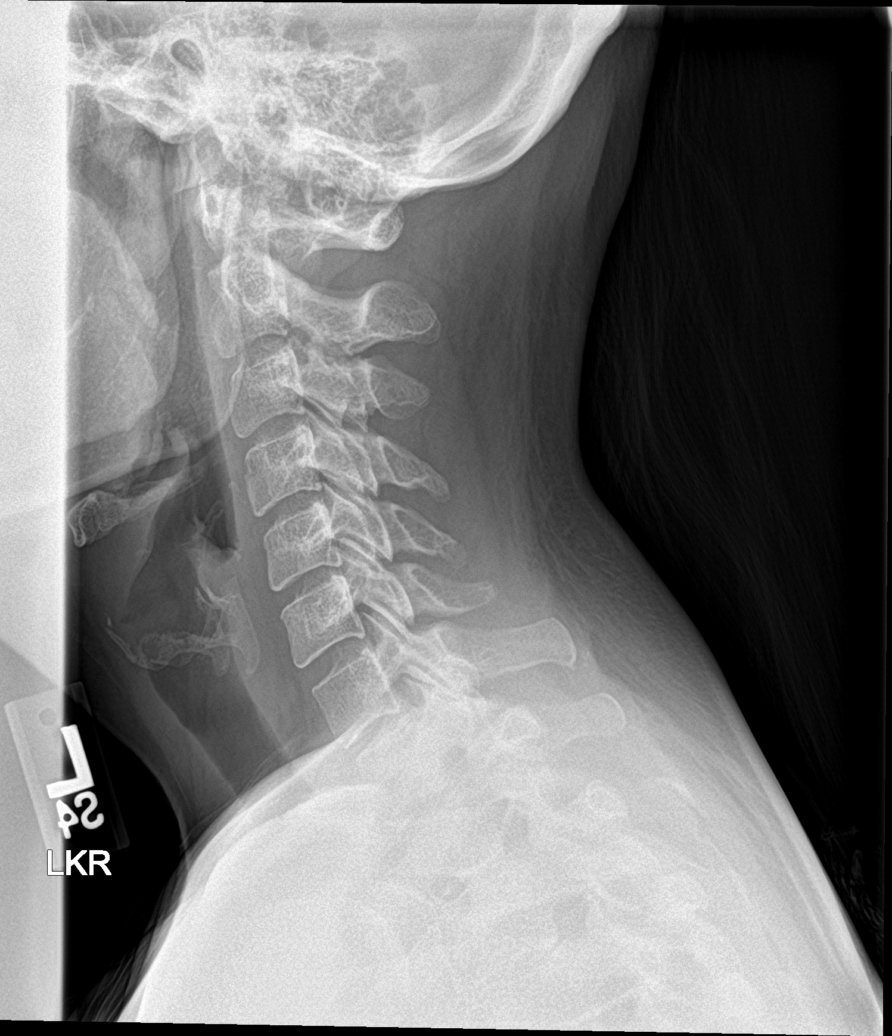

[c-spine obl (1 of 2)]
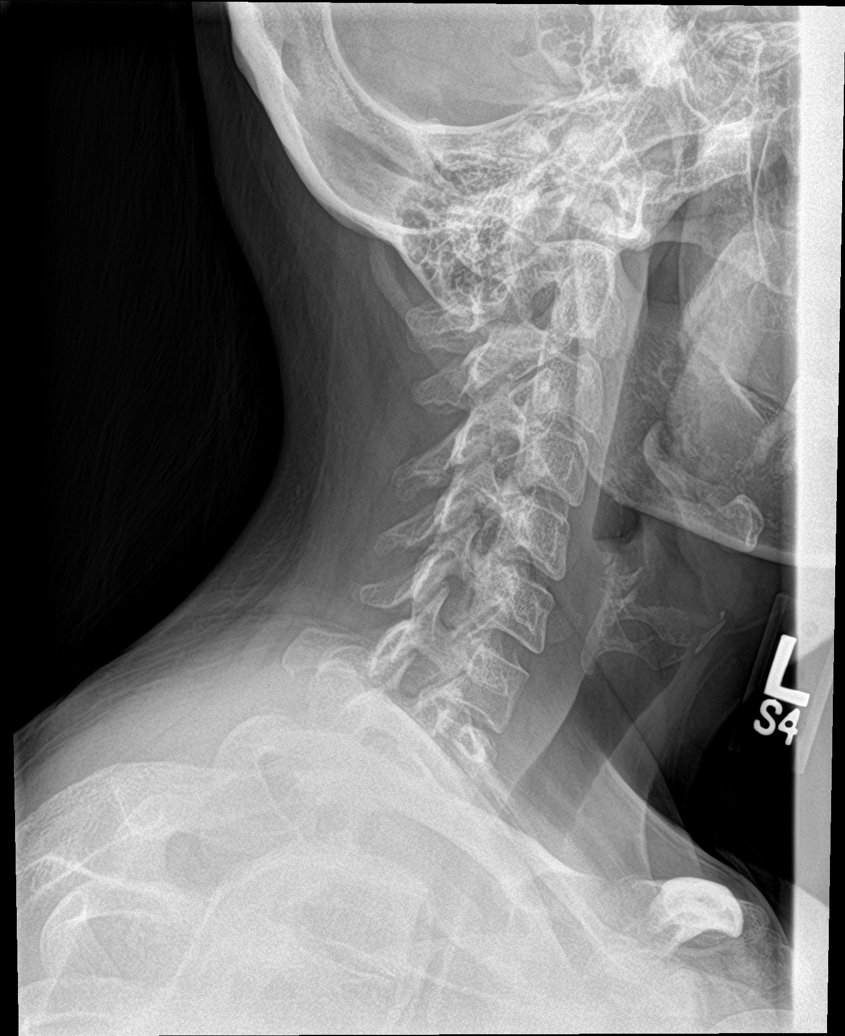

[c-spine obl (2 of 2)]
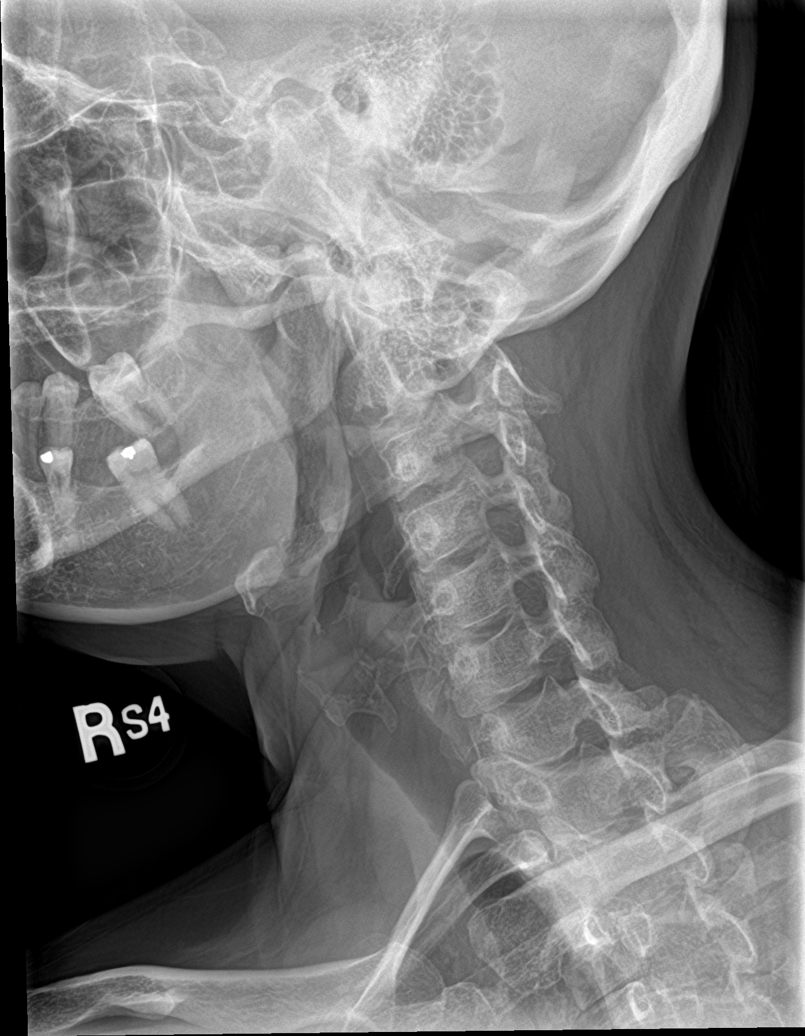

[c-spine ap]
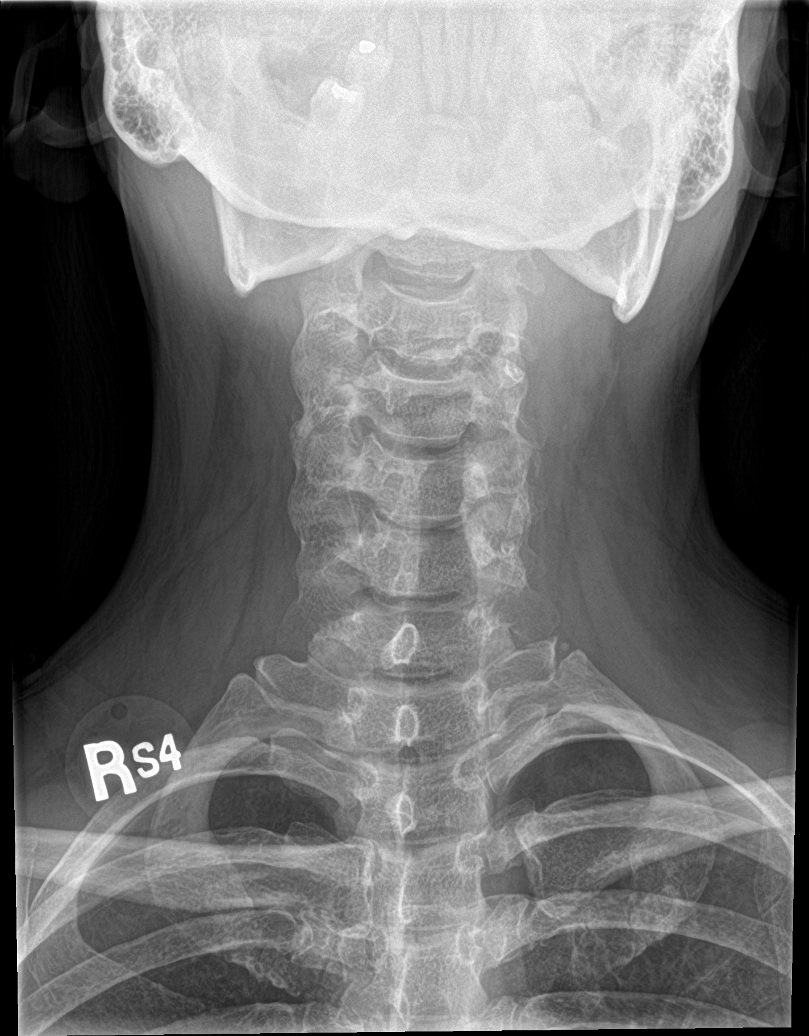

[c-spine open mouth]
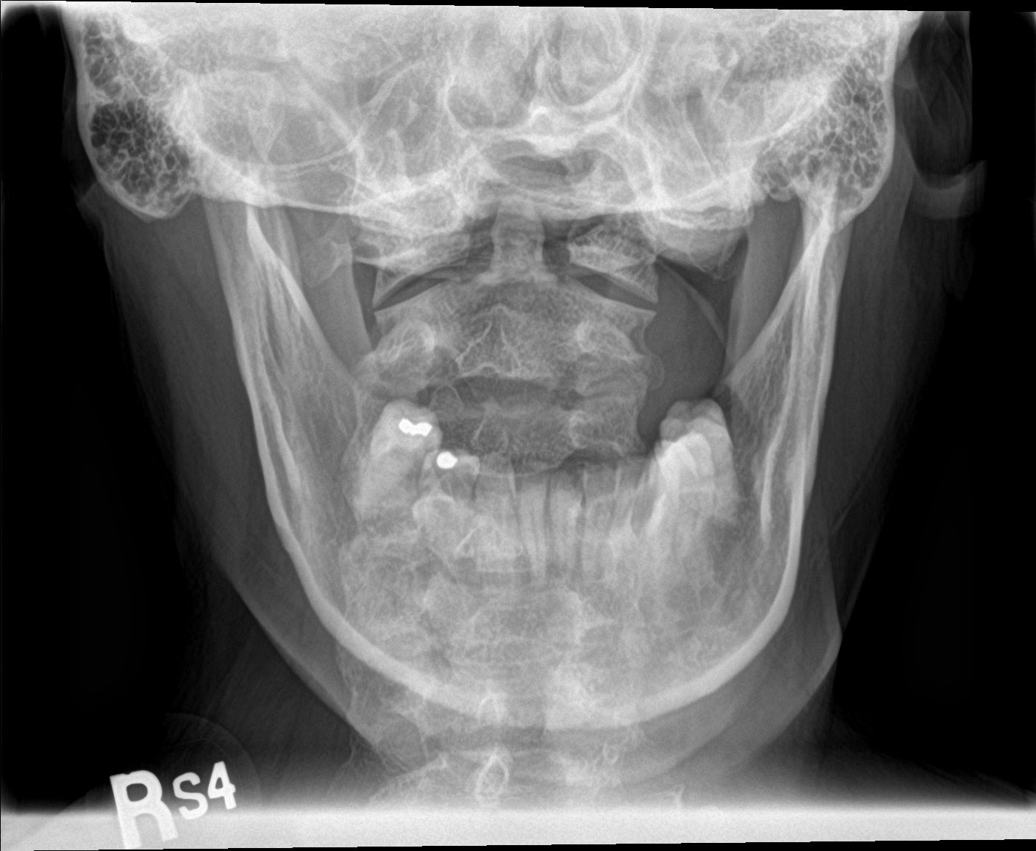

[5 of 5 positions shown; findings below may reference images not displayed]

FINDINGS: There is no evidence of cervical spine fracture or prevertebral soft
tissue swelling. Alignment is normal. No other significant bone
abnormalities are identified.
IMPRESSION: Negative cervical spine radiographs.

## 2018-12-21 ENCOUNTER — Other Ambulatory Visit: Payer: Self-pay

## 2018-12-21 ENCOUNTER — Emergency Department
Admission: EM | Admit: 2018-12-21 | Discharge: 2018-12-21 | Disposition: A | Discharge status: Home / Self Care | Payer: BC Managed Care – PPO | Attending: Emergency Medicine | Admitting: Emergency Medicine

## 2018-12-21 ENCOUNTER — Emergency Department: Payer: BC Managed Care – PPO

## 2018-12-21 DIAGNOSIS — X500XXA Overexertion from strenuous movement or load, initial encounter: Secondary | ICD-10-CM | POA: Diagnosis not present

## 2018-12-21 DIAGNOSIS — Y93F2 Activity, caregiving, lifting: Secondary | ICD-10-CM | POA: Diagnosis not present

## 2018-12-21 DIAGNOSIS — F17200 Nicotine dependence, unspecified, uncomplicated: Secondary | ICD-10-CM | POA: Diagnosis not present

## 2018-12-21 DIAGNOSIS — Y929 Unspecified place or not applicable: Secondary | ICD-10-CM | POA: Insufficient documentation

## 2018-12-21 DIAGNOSIS — Y999 Unspecified external cause status: Secondary | ICD-10-CM | POA: Insufficient documentation

## 2018-12-21 DIAGNOSIS — Z79899 Other long term (current) drug therapy: Secondary | ICD-10-CM | POA: Insufficient documentation

## 2018-12-21 DIAGNOSIS — S46911A Strain of unspecified muscle, fascia and tendon at shoulder and upper arm level, right arm, initial encounter: Secondary | ICD-10-CM | POA: Insufficient documentation

## 2018-12-21 DIAGNOSIS — S4991XA Unspecified injury of right shoulder and upper arm, initial encounter: Secondary | ICD-10-CM | POA: Diagnosis present

## 2018-12-21 DIAGNOSIS — J449 Chronic obstructive pulmonary disease, unspecified: Secondary | ICD-10-CM | POA: Insufficient documentation

## 2018-12-21 MED ORDER — KETOROLAC TROMETHAMINE 30 MG/ML IJ SOLN
30.0000 mg | Freq: Once | INTRAMUSCULAR | Status: AC
  Administered 2018-12-21: 08:00:00 30 mg via INTRAMUSCULAR
  Filled 2018-12-21: qty 1

## 2018-12-21 MED ORDER — TRAMADOL HCL 50 MG PO TABS: 50.0000 mg | ORAL_TABLET | Freq: Four times a day (QID) | ORAL | 0 refills | Status: AC | PRN

## 2018-12-21 NOTE — ED Provider Notes (Signed)
[facility] Emergency Department Provider Note   ____________________________________________    I have reviewed the triage vital signs and the nursing notes.   HISTORY  Chief Complaint Shoulder Pain     HPI STORY CONTI is a 43 y.o. female who presents with complaints of right shoulder pain.  She reports that she was lifting something heavy yesterday and now has worsened shoulder pain.  Reportedly is on prednisone by her PCP for shoulder pain.  Reports compliance with her medication.  No other physical complaints at this time  Past Medical History:  Diagnosis Date  . Acute ethmoidal sinusitis   . Acute ethmoidal sinusitis   . Acute maxillary sinusitis   . Acute upper respiratory infections of unspecified site   . Anxiety state, unspecified   . Chest pain, unspecified   . Depression   . Diarrhea   . Esophageal reflux   . Generalized anxiety disorder   . Hyperlipidemia   . Hypoglycemia, unspecified   . Lumbago   . Nausea with vomiting   . Obstructive chronic bronchitis with exacerbation ([place])   . Other and unspecified hyperlipidemia   . Other and unspecified peripheral vertigo(386.19)   . Other bursitis disorders   . Other malaise and fatigue   . Other specified diseases due to viruses   . Other vitamin B12 deficiency anemia   . Pain in limb   . Pernicious anemia   . Sleep related hypoventilation/hypoxemia in conditions classifiable elsewhere   . Swelling, mass, or lump in head and neck   . Tobacco use disorder   . Unspecified disorder of external ear   . Unspecified disorder of skin and subcutaneous tissue   . Unspecified otitis media     There are no active problems to display for this patient.   No past surgical history on file.  Prior to Admission medications   Medication Sig Start Date End Date Taking? Authorizing Provider  albuterol (PROVENTIL HFA;VENTOLIN HFA) 108 (90 BASE) MCG/ACT inhaler Inhale 2 puffs into the  lungs every 6 (six) hours as needed.      [provider]  ALPRAZolam [name]) 0.5 MG tablet Take 0.5 mg by mouth at bedtime as needed for anxiety.    [provider]  MedroxyPROGESTERone Acetate (DEPO-PROVERA IM) Inject into the muscle.      [provider]  metoprolol succinate (TOPROL-XL) 50 MG 24 hr tablet Take 50 mg by mouth 2 (two) times daily. Take with or immediately following a meal.    [provider]  predniSONE (DELTASONE) 10 MG tablet Take 6 tablets  today, on day 2 take 5 tablets, day 3 take 4 tablets, day 4 take 3 tablets, day 5 take  2 tablets and 1 tablet the last day 09/10/17   [name], PA-C  traMADol (ULTRAM) 50 MG tablet Take 1 tablet (50 mg total) by mouth every 6 (six) hours as needed. 12/21/18 12/21/19  Lavonia Drafts, MD     Allergies Contrast media [iodinated diagnostic agents] and Sulfa drugs cross reactors  Family History  Problem Relation Age of Onset  . Depression Mother   . Cancer Father        Father  . Hypertension Sister   . Cancer Paternal Grandfather        Breast   . Breast cancer Maternal Grandmother     Social History Social History   Tobacco Use  . Smoking status: Current Every Day Smoker  . Smokeless tobacco: Never  Used  . Tobacco comment: Has been smoking for 20 years  Substance Use Topics  . Alcohol use: No  . Drug use: Not on file    Review of Systems  Constitutional: No fever/chills  ENT: No neck pain Cardiovascular: Denies chest pain. Respiratory: Denies shortness of breath.  Musculoskeletal: As above Skin: Negative for rash. Neurological: Negative for headaches or weakness   ____________________________________________   PHYSICAL EXAM:  VITAL SIGNS: ED Triage Vitals  Enc Vitals Group     BP 12/21/18 0639 115/80     Pulse Rate 12/21/18 0639 91     Resp 12/21/18 0639 16     Temp 12/21/18 0639 98.1 F (36.7 C)     Temp Source 12/21/18 0639 Oral     SpO2 12/21/18 0639 100 %      Weight 12/21/18 0640 68 kg (150 lb)     Height 12/21/18 0640 1.549 m (5\' 1" )     Head Circumference --      Peak Flow --      Pain Score 12/21/18 0640 10     Pain Loc --      Pain Edu? --      Excl. in [place]? --     Constitutional: Alert and oriented.    Neck:  Painless ROM Cardiovascular: Normal rate, regular rhythm. Grossly normal heart sounds.  Good peripheral circulation. Respiratory: Normal respiratory effort.  No retractions.  Gastrointestinal: Soft and nontender. No distention.    Musculoskeletal: 2+ distal pulses, range of motion with some discomfort with abduction especially above 90 degrees Neurologic:  Normal speech and language. No gross focal neurologic deficits are appreciated.  Skin:  Skin is warm, dry and intact. No herpetic rash. Psychiatric: Mood and affect are normal.   ____________________________________________   LABS (all labs ordered are listed, but only abnormal results are displayed)  Labs Reviewed - No data to display ____________________________________________  EKG  None ____________________________________________  RADIOLOGY  None ____________________________________________   PROCEDURES  Procedure(s) performed: No  Procedures   Critical Care performed: No ____________________________________________   INITIAL IMPRESSION / ASSESSMENT AND PLAN / ED COURSE  Pertinent labs & imaging results that were available during my care of the patient were reviewed by me and considered in my medical decision making (see chart for details).  Patient presents with acute on chronic right shoulder pain, likely related to strain from lifting heavy object yesterday.  Overall reassuring exam, is already on prednisone, will give IM Toradol and NSAIDs  X-ray negative, appropriate for outpatient follow-up with orthopedics   ____________________________________________   FINAL CLINICAL IMPRESSION(S) / ED DIAGNOSES  Final diagnoses:  Strain of  right shoulder, initial encounter        Note:  This document was prepared using Dragon voice recognition software and may include unintentional dictation errors.   Lavonia Drafts, MD 12/21/18 1228

## 2018-12-21 NOTE — ED Notes (Signed)

## 2018-12-21 NOTE — ED Triage Notes (Signed)
Pt with right shoulder chronic pain. Pt states she was moving something yesterday and now has increased right shoulder pain. Pt was recently prescribed prednisone by PMD for shoulder pain.

## 2018-12-30 DIAGNOSIS — M7582 Other shoulder lesions, left shoulder: Secondary | ICD-10-CM | POA: Insufficient documentation

## 2019-01-06 ENCOUNTER — Other Ambulatory Visit: Payer: Self-pay | Admitting: Surgery

## 2019-01-06 DIAGNOSIS — M7582 Other shoulder lesions, left shoulder: Secondary | ICD-10-CM

## 2019-01-14 ENCOUNTER — Other Ambulatory Visit: Payer: Self-pay | Admitting: Surgery

## 2019-01-14 DIAGNOSIS — M7581 Other shoulder lesions, right shoulder: Secondary | ICD-10-CM

## 2019-01-16 ENCOUNTER — Other Ambulatory Visit: Payer: Self-pay

## 2019-01-16 ENCOUNTER — Ambulatory Visit
Admission: RE | Admit: 2019-01-16 | Discharge: 2019-01-16 | Disposition: A | Discharge status: Home / Self Care | Payer: BC Managed Care – PPO | Source: Ambulatory Visit | Attending: Surgery | Admitting: Surgery

## 2019-01-16 DIAGNOSIS — M7582 Other shoulder lesions, left shoulder: Secondary | ICD-10-CM | POA: Insufficient documentation

## 2019-01-16 DIAGNOSIS — M7581 Other shoulder lesions, right shoulder: Secondary | ICD-10-CM | POA: Insufficient documentation

## 2019-01-20 DIAGNOSIS — M75112 Incomplete rotator cuff tear or rupture of left shoulder, not specified as traumatic: Secondary | ICD-10-CM | POA: Insufficient documentation

## 2019-01-20 DIAGNOSIS — M7522 Bicipital tendinitis, left shoulder: Secondary | ICD-10-CM | POA: Insufficient documentation

## 2019-01-26 ENCOUNTER — Ambulatory Visit: Payer: BC Managed Care – PPO

## 2019-09-23 ENCOUNTER — Other Ambulatory Visit: Payer: Self-pay | Admitting: Internal Medicine

## 2019-09-23 DIAGNOSIS — R1900 Intra-abdominal and pelvic swelling, mass and lump, unspecified site: Secondary | ICD-10-CM

## 2019-09-30 ENCOUNTER — Ambulatory Visit: Payer: Self-pay | Attending: Internal Medicine

## 2020-01-24 ENCOUNTER — Emergency Department
Admission: EM | Admit: 2020-01-24 | Discharge: 2020-01-24 | Discharge status: Against Medical Advice | Payer: Medicaid Other

## 2020-01-25 DIAGNOSIS — R29898 Other symptoms and signs involving the musculoskeletal system: Secondary | ICD-10-CM | POA: Insufficient documentation

## 2020-01-25 DIAGNOSIS — E876 Hypokalemia: Secondary | ICD-10-CM | POA: Insufficient documentation

## 2020-02-20 DIAGNOSIS — E2749 Other adrenocortical insufficiency: Secondary | ICD-10-CM | POA: Insufficient documentation

## 2020-03-25 DIAGNOSIS — R252 Cramp and spasm: Secondary | ICD-10-CM | POA: Insufficient documentation

## 2020-04-06 DIAGNOSIS — B370 Candidal stomatitis: Secondary | ICD-10-CM | POA: Insufficient documentation

## 2020-04-06 DIAGNOSIS — R490 Dysphonia: Secondary | ICD-10-CM | POA: Insufficient documentation

## 2020-05-30 ENCOUNTER — Other Ambulatory Visit: Payer: Self-pay

## 2020-05-30 ENCOUNTER — Emergency Department
Admission: EM | Admit: 2020-05-30 | Discharge: 2020-05-30 | Disposition: A | Discharge status: Home / Self Care | Payer: HRSA Program | Attending: Emergency Medicine | Admitting: Emergency Medicine

## 2020-05-30 ENCOUNTER — Encounter: Payer: Self-pay | Admitting: Emergency Medicine

## 2020-05-30 DIAGNOSIS — R059 Cough, unspecified: Secondary | ICD-10-CM | POA: Diagnosis present

## 2020-05-30 DIAGNOSIS — U071 COVID-19: Secondary | ICD-10-CM | POA: Insufficient documentation

## 2020-05-30 DIAGNOSIS — F17200 Nicotine dependence, unspecified, uncomplicated: Secondary | ICD-10-CM | POA: Insufficient documentation

## 2020-05-30 DIAGNOSIS — Z79899 Other long term (current) drug therapy: Secondary | ICD-10-CM | POA: Insufficient documentation

## 2020-05-30 DIAGNOSIS — Z20822 Contact with and (suspected) exposure to covid-19: Secondary | ICD-10-CM

## 2020-05-30 LAB — SARS CORONAVIRUS 2 (TAT 6-24 HRS): SARS Coronavirus 2: POSITIVE — AB

## 2020-05-30 NOTE — Discharge Instructions (Signed)

## 2020-05-30 NOTE — ED Provider Notes (Signed)
[facility] Emergency Department Provider Note  ____________________________________________   Event Date/Time   First MD Initiated Contact with Patient 05/30/20 1041     (approximate)  I have reviewed the triage vital signs and the nursing notes.   HISTORY  Chief Complaint Generalized Body Aches, Fever, and Headache    HPI ANNIKAH LOVINS is a 45 y.o. female presents to the emergency department with URI symptoms for 2 days.   Is complaining of cough, congestion, fever, chills, denies chest pain, denies shortness of breath, positive close contact with Covid19+ patient, patient is partially vaccinated.   Past Medical History:  Diagnosis Date  . Acute ethmoidal sinusitis   . Acute ethmoidal sinusitis   . Acute maxillary sinusitis   . Acute upper respiratory infections of unspecified site   . Anxiety state, unspecified   . Chest pain, unspecified   . Depression   . Diarrhea   . Esophageal reflux   . Generalized anxiety disorder   . Hyperlipidemia   . Hypoglycemia, unspecified   . Lumbago   . Nausea with vomiting   . Obstructive chronic bronchitis with exacerbation ([place])   . Other and unspecified hyperlipidemia   . Other and unspecified peripheral vertigo(386.19)   . Other bursitis disorders   . Other malaise and fatigue   . Other specified diseases due to viruses   . Other vitamin B12 deficiency anemia   . Pain in limb   . Pernicious anemia   . Sleep related hypoventilation/hypoxemia in conditions classifiable elsewhere   . Swelling, mass, or lump in head and neck   . Tobacco use disorder   . Unspecified disorder of external ear   . Unspecified disorder of skin and subcutaneous tissue   . Unspecified otitis media     There are no problems to display for this patient.   History reviewed. No pertinent surgical history.  Prior to Admission medications   Medication Sig Start Date End Date Taking? Authorizing Provider  albuterol  (PROVENTIL HFA;VENTOLIN HFA) 108 (90 BASE) MCG/ACT inhaler Inhale 2 puffs into the lungs every 6 (six) hours as needed.      [provider]  ALPRAZolam [name]) 0.5 MG tablet Take 0.5 mg by mouth at bedtime as needed for anxiety.    [provider]  MedroxyPROGESTERone Acetate (DEPO-PROVERA IM) Inject into the muscle.      [provider]  metoprolol succinate (TOPROL-XL) 50 MG 24 hr tablet Take 50 mg by mouth 2 (two) times daily. Take with or immediately following a meal.    [provider]  predniSONE (DELTASONE) 10 MG tablet Take 6 tablets  today, on day 2 take 5 tablets, day 3 take 4 tablets, day 4 take 3 tablets, day 5 take  2 tablets and 1 tablet the last day 09/10/17   [name], PA-C    Allergies Contrast media [iodinated diagnostic agents] and Sulfa drugs cross reactors  Family History  Problem Relation Age of Onset  . Depression Mother   . Cancer Father        Father  . Hypertension Sister   . Cancer Paternal Grandfather        Breast   . Breast cancer Maternal Grandmother     Social History Social History   Tobacco Use  . Smoking status: Current Every Day Smoker  . Smokeless tobacco: Never Used  . Tobacco comment: Has been smoking for 20 years  Substance Use Topics  . Alcohol use: No  Review of Systems  Constitutional: Positive fever/chills Eyes: No visual changes. ENT: Denies sore throat. Respiratory: Positive cough Cardiovascular: Denies chest pain Gastrointestinal: Denies abdominal pain Genitourinary: Negative for dysuria. Musculoskeletal: Negative for back pain. Skin: Negative for rash. Neurological: Denies neurological changes    ____________________________________________   PHYSICAL EXAM:  VITAL SIGNS: ED Triage Vitals  Enc Vitals Group     BP 05/30/20 0932 129/83     Pulse Rate 05/30/20 0932 (!) 118     Resp 05/30/20 0932 18     Temp 05/30/20 0932 98.3 F (36.8 C)     Temp Source 05/30/20 0932  Oral     SpO2 05/30/20 0932 99 %     Weight --      Height --      Head Circumference --      Peak Flow --      Pain Score 05/30/20 0950 10     Pain Loc --      Pain Edu? --      Excl. in [place]? --     Constitutional: Alert and oriented. Well appearing and in no acute distress. Eyes: Conjunctivae are normal.  Head: Atraumatic. Nose: No congestion/rhinnorhea. Mouth/Throat: Mucous membranes are moist.  Neck:  supple no lymphadenopathy noted Cardiovascular: Normal rate, regular rhythm. Heart sounds are normal Respiratory: Normal respiratory effort.  No retractions, lungs CTA GU: deferred Musculoskeletal: FROM all extremities, warm and well perfused Neurologic:  Normal speech and language.  Skin:  Skin is warm, dry and intact. No rash noted. Psychiatric: Mood and affect are normal. Speech and behavior are normal.  ____________________________________________   LABS (all labs ordered are listed, but only abnormal results are displayed)  Labs Reviewed  SARS CORONAVIRUS 2 (TAT 6-24 HRS)   ____________________________________________   ____________________________________________  RADIOLOGY    ____________________________________________   PROCEDURES  Procedure(s) performed: No  Procedures    ____________________________________________   INITIAL IMPRESSION / ASSESSMENT AND PLAN / ED COURSE  Pertinent labs & imaging results that were available during my care of the patient were reviewed by me and considered in my medical decision making (see chart for details).   Patient is a 45 year old partially vaccinated female who complains of URI symptoms.  Exam is consistent with covid.    Pending test for covid   The patient was instructed to quarantine themselves at home.  Follow-up with your regular doctor if any concerns.  Return emergency department for worsening. OTC measures discussed     NATHALIA WISMER was evaluated in Emergency Department on 05/30/2020 for  the symptoms described in the history of present illness. She was evaluated in the context of the global COVID-19 pandemic, which necessitated consideration that the patient might be at risk for infection with the SARS-CoV-2 virus that causes COVID-19. Institutional protocols and algorithms that pertain to the evaluation of patients at risk for COVID-19 are in a state of rapid change based on information released by regulatory bodies including the CDC and federal and state organizations. These policies and algorithms were followed during the patient's care in the ED.   As part of my medical decision making, I reviewed the following data within the [place] notes reviewed and incorporated, Old chart reviewed, Notes from prior ED visits and [place] Controlled Substance Database  ____________________________________________   FINAL CLINICAL IMPRESSION(S) / ED DIAGNOSES  Final diagnoses:  Suspected COVID-19 virus infection      NEW MEDICATIONS STARTED DURING THIS VISIT:  New Prescriptions   No  medications on file     Note:  This document was prepared using Dragon voice recognition software and may include unintentional dictation errors.    Versie Starks, PA-C 05/30/20 1119    Lavonia Drafts, MD 05/30/20 [phone]

## 2020-05-30 NOTE — ED Triage Notes (Signed)
Pt to ED via POV c/o body aches, fever, headache, and nasal congestion. Pt has been exposed to [place]. Pt is in NAD.

## 2020-05-31 ENCOUNTER — Other Ambulatory Visit: Payer: Self-pay | Admitting: Physician Assistant

## 2020-05-31 ENCOUNTER — Telehealth: Payer: Self-pay

## 2020-05-31 ENCOUNTER — Telehealth: Payer: Self-pay | Admitting: *Deleted

## 2020-05-31 DIAGNOSIS — U071 COVID-19: Secondary | ICD-10-CM

## 2020-05-31 DIAGNOSIS — E2740 Unspecified adrenocortical insufficiency: Secondary | ICD-10-CM

## 2020-05-31 DIAGNOSIS — E663 Overweight: Secondary | ICD-10-CM

## 2020-05-31 MED ORDER — MOLNUPIRAVIR EUA 200MG CAPSULE: 4.0000 | ORAL_CAPSULE | Freq: Two times a day (BID) | ORAL | 0 refills | Status: AC

## 2020-05-31 MED ORDER — PROMETHAZINE HCL 25 MG PO TABS: 12.5000 mg | ORAL_TABLET | Freq: Four times a day (QID) | ORAL | 0 refills | Status: DC | PRN

## 2020-05-31 NOTE — Telephone Encounter (Signed)
Called to discuss with patient about COVID-19 symptoms and the use of one of the available treatments for those with mild to moderate Covid symptoms and at a high risk of hospitalization.  Pt appears to qualify for outpatient treatment due to co-morbid conditions and/or a member of an at-risk group in accordance with the FDA Emergency Use Authorization.   Body aches, fever, nausea, mild chest tightness   Symptom onset: Saturday 05/29/20 Vaccinated: Partially-1st vaccine only Booster? no Immunocompromised? no Qualifiers: Adrenal insufficiency ??  Tarry Kos

## 2020-05-31 NOTE — Telephone Encounter (Signed)
Patient was prescribed oral covid treatment Molnupiravir and treatment note was reviewed. Medication has been received by [facility] and reviewed for appropriateness.  Drug Interactions or Dosage Adjustments Noted: none  Delivery Method: pick-up  Patient contacted for counseling on 05/31/20 and verbalized understanding. Counseled on the need to use contraception during treatment and 4 days after.  Delivery or Pick-Up Date: 05/31/20   Tobin Chad 05/31/2020, 3:47 PM [facility] Outpatient Pharmacist Phone# [phone]

## 2020-05-31 NOTE — Progress Notes (Signed)
Outpatient Oral COVID Treatment Note  I connected with Lisa Davila on 05/31/2020/2:48 PM by telephone and verified that I am speaking with the correct person using two identifiers.  I discussed the limitations, risks, security, and privacy concerns of performing an evaluation and management service by telephone and the availability of in person appointments. I also discussed with the patient that there may be a patient responsible charge related to this service. The patient expressed understanding and agreed to proceed.  Patient location: Home Provider location: Home  Diagnosis: COVID-19 infection  Purpose of visit: Discussion of potential use of Molnupiravir or Paxlovid, a new treatment for mild to moderate COVID-19 viral infection in non-hospitalized patients.   Subjective: Patient is a 45 y.o. female who has been diagnosed with COVID 19 viral infection.  Their symptoms began on 05/29/20  with Headache, fever and body ache.   Past Medical History:  Diagnosis Date  . Acute ethmoidal sinusitis   . Acute ethmoidal sinusitis   . Acute maxillary sinusitis   . Acute upper respiratory infections of unspecified site   . Anxiety state, unspecified   . Chest pain, unspecified   . Depression   . Diarrhea   . Esophageal reflux   . Generalized anxiety disorder   . Hyperlipidemia   . Hypoglycemia, unspecified   . Lumbago   . Nausea with vomiting   . Obstructive chronic bronchitis with exacerbation ([place])   . Other and unspecified hyperlipidemia   . Other and unspecified peripheral vertigo(386.19)   . Other bursitis disorders   . Other malaise and fatigue   . Other specified diseases due to viruses   . Other vitamin B12 deficiency anemia   . Pain in limb   . Pernicious anemia   . Sleep related hypoventilation/hypoxemia in conditions classifiable elsewhere   . Swelling, mass, or lump in head and neck   . Tobacco use disorder   . Unspecified disorder of external ear   . Unspecified  disorder of skin and subcutaneous tissue   . Unspecified otitis media     Allergies  Allergen Reactions  . Contrast Media [Iodinated Diagnostic Agents] Hives  . Sulfa Drugs Cross Reactors      Current Outpatient Medications:  .  albuterol (PROVENTIL HFA;VENTOLIN HFA) 108 (90 BASE) MCG/ACT inhaler, Inhale 2 puffs into the lungs every 6 (six) hours as needed.  , Disp: , Rfl:  .  ALPRAZolam (XANAX) 0.5 MG tablet, Take 0.5 mg by mouth at bedtime as needed for anxiety., Disp: , Rfl:  .  MedroxyPROGESTERone Acetate (DEPO-PROVERA IM), Inject into the muscle.  , Disp: , Rfl:  .  metoprolol succinate (TOPROL-XL) 50 MG 24 hr tablet, Take 50 mg by mouth 2 (two) times daily. Take with or immediately following a meal., Disp: , Rfl:  .  predniSONE (DELTASONE) 10 MG tablet, Take 6 tablets  today, on day 2 take 5 tablets, day 3 take 4 tablets, day 4 take 3 tablets, day 5 take  2 tablets and 1 tablet the last day, Disp: 21 tablet, Rfl: 0  Objective: Patient appears/sounds congested and sick.  They are in no apparent distress.  Breathing is non labored.  Mood and behavior are normal.  Laboratory Data:  Recent Results (from the past 2160 hour(s))  SARS CORONAVIRUS 2 (TAT 6-24 HRS) Nasopharyngeal Nasopharyngeal Swab     Status: Abnormal   Collection Time: 05/30/20 10:50 AM   Specimen: Nasopharyngeal Swab  Result Value Ref Range   SARS Coronavirus 2 POSITIVE (A)  NEGATIVE    Comment: (NOTE) SARS-CoV-2 target nucleic acids are DETECTED.  The SARS-CoV-2 RNA is generally detectable in upper and lower respiratory specimens during the acute phase of infection. Positive results are indicative of the presence of SARS-CoV-2 RNA. Clinical correlation with patient history and other diagnostic information is  necessary to determine patient infection status. Positive results do not rule out bacterial infection or co-infection with other viruses.  The expected result is Negative.  Fact Sheet for  Patients: SugarRoll.be  Fact Sheet for Healthcare Providers: https://www.woods-mathews.com/  This test is not yet approved or cleared by the [place] FDA and  has been authorized for detection and/or diagnosis of SARS-CoV-2 by FDA under an Emergency Use Authorization (EUA). This EUA will remain  in effect (meaning this test can be used) for the duration of the COVID-19 declaration under Section 564(b)(1) of the Act, 21 U. S.C. section 360bbb-3(b)(1), unless the authorization is terminated or revoked sooner.   Performed at [facility], [place] [address], [place], [place] [address]      Assessment: 45 y.o. female with mild/moderate COVID 19 viral infection diagnosed on 05/31/20 at  at high risk for progression to severe COVID 19.  Plan:  This patient is a 45 y.o. female that meets the following criteria for Emergency Use Authorization of: Molnupiravir  1. Age >18 yr 2. SARS-COV-2 positive test 3. Symptom onset < 5 days 4. Mild-to-moderate COVID disease with high risk for severe progression to hospitalization or death   I have spoken and communicated the following to the patient or parent/caregiver regarding: 1. Molnupiravir is an unapproved drug that is authorized for use under an [employer] production planner.  2. There are no adequate, approved, available products for the treatment of COVID-19 in adults who have mild-to-moderate COVID-19 and are at high risk for progressing to severe COVID-19, including hospitalization or death. 3. Other therapeutics are currently authorized. For additional information on all products authorized for treatment or prevention of COVID-19, please see TanEmporium.pl.  4. There are benefits and risks of taking this treatment as outlined in the "Fact Sheet for Patients and Caregivers."  5. "Fact Sheet  for Patients and Caregivers" was reviewed with patient. A hard copy will be provided to patient from pharmacy prior to the patient receiving treatment. 6. Patients should continue to self-isolate and use infection control measures (e.g., wear mask, isolate, social distance, avoid sharing personal items, clean and disinfect "high touch" surfaces, and frequent handwashing) according to CDC guidelines.  7. The patient or parent/caregiver has the option to accept or refuse treatment. 8. [place] has established a pregnancy surveillance program. 9. Females of childbearing potential should use a reliable method of contraception correctly and consistently, as applicable, for the duration of treatment and for 4 days after the last dose of Molnupiravir. 37. Males of reproductive potential who are sexually active with females of childbearing potential should use a reliable method of contraception correctly and consistently during treatment and for at least 3 months after the last dose. 11. Pregnancy status and risk was assessed. Patient verbalized understanding of precautions.   After reviewing above information with the patient, the patient agrees to receive molnupiravir.  Follow up instructions:    . Take prescription BID x 5 days as directed . Reach out to pharmacist for counseling on medication if desired . For concerns regarding further COVID symptoms please follow up with your PCP or urgent care . For urgent or life-threatening issues, seek care at  your local emergency department  The patient was provided an opportunity to ask questions, and all were answered. The patient agreed with the plan and demonstrated an understanding of the instructions.   Script sent to [facility] and opted to pick up RX.  The patient was advised to call their PCP or seek an in-person evaluation if the symptoms worsen or if the condition fails to improve as anticipated.   I provided 40 minutes  of non face-to-face telephone visit time during this encounter, and > 50% was spent counseling as documented under my assessment & plan.  [place], [place] 05/31/2020 /2:48 PM

## 2020-06-01 ENCOUNTER — Telehealth (INDEPENDENT_AMBULATORY_CARE_PROVIDER_SITE_OTHER): Payer: Self-pay | Admitting: Nurse Practitioner

## 2020-06-01 ENCOUNTER — Encounter: Payer: Self-pay | Admitting: Nurse Practitioner

## 2020-06-01 ENCOUNTER — Other Ambulatory Visit: Payer: Self-pay | Admitting: Nurse Practitioner

## 2020-06-01 DIAGNOSIS — F17200 Nicotine dependence, unspecified, uncomplicated: Secondary | ICD-10-CM | POA: Insufficient documentation

## 2020-06-01 DIAGNOSIS — K115 Sialolithiasis: Secondary | ICD-10-CM | POA: Insufficient documentation

## 2020-06-01 DIAGNOSIS — I10 Essential (primary) hypertension: Secondary | ICD-10-CM | POA: Insufficient documentation

## 2020-06-01 DIAGNOSIS — Z8541 Personal history of malignant neoplasm of cervix uteri: Secondary | ICD-10-CM | POA: Insufficient documentation

## 2020-06-01 DIAGNOSIS — R21 Rash and other nonspecific skin eruption: Secondary | ICD-10-CM

## 2020-06-01 DIAGNOSIS — G43909 Migraine, unspecified, not intractable, without status migrainosus: Secondary | ICD-10-CM | POA: Insufficient documentation

## 2020-06-01 MED ORDER — METHYLPREDNISOLONE 4 MG PO TBPK: ORAL_TABLET | ORAL | 0 refills | Status: DC

## 2020-06-01 NOTE — Telephone Encounter (Signed)
[facility] On Call Provider Note:  Notified by [facility] that patient had contacted them stating she woke this morning with a red splotchy rash after starting Molnupiravir yesterday evening.   Pharmacist reports patient is not experiencing any signs of facial, lip, tongue, throat, or face swelling. No reports signs of urticaria. No reported signs of GI distress. No shortness of breath, chest pain, or lightheadedness.   Prescription sent to [facility] in [place] on file ofr methylprednisolone 4mg  dose pack with instructions to taper 21 tabs over six days.   Recommendations are to begin methylprednisolone dose pack as soon as possible and once started, continue Mulnupiravir as directed. Recommend monitoring for any signs of worsening rash, hives, chest pain, shortness of breath, or any swelling. If any adverse reactions present, recommend stop the medication immediately and notify the pharmacy or on call provider.   VM left on patients machine with instructions and MyChart message sent. Contact information provided. On Call number provided for questions or further needs.

## 2020-07-13 ENCOUNTER — Other Ambulatory Visit: Payer: Self-pay

## 2020-07-13 ENCOUNTER — Encounter: Payer: Self-pay | Admitting: Emergency Medicine

## 2020-07-13 ENCOUNTER — Emergency Department
Admission: EM | Admit: 2020-07-13 | Discharge: 2020-07-13 | Disposition: A | Discharge status: Home / Self Care | Payer: Medicaid Other | Attending: Emergency Medicine | Admitting: Emergency Medicine

## 2020-07-13 DIAGNOSIS — M75101 Unspecified rotator cuff tear or rupture of right shoulder, not specified as traumatic: Secondary | ICD-10-CM | POA: Insufficient documentation

## 2020-07-13 DIAGNOSIS — I10 Essential (primary) hypertension: Secondary | ICD-10-CM | POA: Insufficient documentation

## 2020-07-13 DIAGNOSIS — Z8541 Personal history of malignant neoplasm of cervix uteri: Secondary | ICD-10-CM | POA: Insufficient documentation

## 2020-07-13 DIAGNOSIS — F17200 Nicotine dependence, unspecified, uncomplicated: Secondary | ICD-10-CM | POA: Insufficient documentation

## 2020-07-13 DIAGNOSIS — Z79899 Other long term (current) drug therapy: Secondary | ICD-10-CM | POA: Insufficient documentation

## 2020-07-13 DIAGNOSIS — M75102 Unspecified rotator cuff tear or rupture of left shoulder, not specified as traumatic: Secondary | ICD-10-CM | POA: Insufficient documentation

## 2020-07-13 MED ORDER — BUPIVACAINE HCL 0.5 % IJ SOLN: 6.0000 mL | Freq: Once | INTRAMUSCULAR | Status: DC

## 2020-07-13 MED ORDER — TRIAMCINOLONE ACETONIDE 40 MG/ML IJ SUSP
40.0000 mg | Freq: Once | INTRAMUSCULAR | Status: AC
  Administered 2020-07-13: 40 mg via INTRA_ARTICULAR
  Filled 2020-07-13: qty 1

## 2020-07-13 MED ORDER — BUPIVACAINE HCL (PF) 0.5 % IJ SOLN
6.0000 mL | Freq: Once | INTRAMUSCULAR | Status: AC
  Administered 2020-07-13: 6 mL
  Filled 2020-07-13: qty 10

## 2020-07-13 MED ORDER — LIDOCAINE HCL (PF) 1 % IJ SOLN
6.0000 mL | Freq: Once | INTRAMUSCULAR | Status: AC
  Administered 2020-07-13: 6 mL
  Filled 2020-07-13: qty 10

## 2020-07-13 NOTE — ED Notes (Signed)
See triage note  Presents with bilateral shoulder pain  States she has not had an injury   Has rotator cuff issues  States usually get injections for same

## 2020-07-13 NOTE — ED Provider Notes (Signed)
[facility] EMERGENCY DEPARTMENT Provider Note   CSN: [id number] Arrival date & time: 07/13/20  1455     History Chief Complaint  Patient presents with  . Shoulder Pain    Lisa Davila is a 45 y.o. female presents to the emergency department evaluation of bilateral shoulder pain.  Left shoulder pain greater than right.  No recent trauma or injury.  She has had previous MRIs showing bursal surface tear to the left rotator cuff with bilateral tendinopathy.  She was receiving cortisone injections every 3 months and developed some adrenal insufficiency.  Patient was recently cleared by endocrinology to continue with subacromial bursa injections.  She is scheduled in 3 months to see orthopedic specialist at [facility] where she receives [employer] care.  Patient's pain is severe.  She works as a [employer]arry manager.  Has a hard time with abduction and flexion greater than 90 degrees.  HPI     Past Medical History:  Diagnosis Date  . Acute ethmoidal sinusitis   . Acute ethmoidal sinusitis   . Acute maxillary sinusitis   . Acute upper respiratory infections of unspecified site   . Anxiety state, unspecified   . Chest pain, unspecified   . Depression   . Diarrhea   . Esophageal reflux   . Generalized anxiety disorder   . Hyperlipidemia   . Hypoglycemia, unspecified   . Lumbago   . Nausea with vomiting   . Obstructive chronic bronchitis with exacerbation ([place])   . Other and unspecified hyperlipidemia   . Other and unspecified peripheral vertigo(386.19)   . Other bursitis disorders   . Other malaise and fatigue   . Other specified diseases due to viruses   . Other vitamin B12 deficiency anemia   . Pain in limb   . Pernicious anemia   . Sleep related hypoventilation/hypoxemia in conditions classifiable elsewhere   . Swelling, mass, or lump in head and neck   . Tobacco use disorder   . Unspecified disorder of external ear   . Unspecified disorder of skin and subcutaneous  tissue   . Unspecified otitis media     Patient Active Problem List   Diagnosis Date Noted  . Hx of cervical cancer 06/01/2020  . Hypertension 06/01/2020  . Migraines 06/01/2020  . Sialolithiasis 06/01/2020  . Tobacco use disorder 06/01/2020  . Hoarseness 04/06/2020  . Oral thrush 04/06/2020  . Leg cramping 03/25/2020  . Iatrogenic adrenal insufficiency ([place]) 02/20/2020  . Hypokalemia 01/25/2020  . Weakness of both legs 01/25/2020  . Nontraumatic incomplete tear of left rotator cuff 01/20/2019  . Tendinitis of upper biceps tendon of left shoulder 01/20/2019  . Rotator cuff tendinitis, left 12/30/2018  . Palpitation 01/08/2018  . Hypercholesteremia 01/08/2018  . Stress at work 01/08/2018  . Trochanteric bursitis of left hip 11/30/2016  . Benzodiazepine misuse 05/03/2016  . Cervical cancer screening 05/03/2016  . Chronic sinusitis 03/16/2015  . Nasal obstruction 03/16/2015  . High risk medication use 11/03/2014  . Other long term (current) drug therapy 11/03/2014  . Polypharmacy 11/03/2014  . Arthralgia of temporomandibular joint, unspecified side 10/24/2013  . Carpal tunnel syndrome 08/04/2013  . Lateral epicondylitis 08/04/2013  . Knee pain 02/03/2013  . Muscle strain 02/03/2013  . Pain in left knee 02/03/2013  . Shoulder pain 02/03/2013  . Dysphagia, pharyngeal 09/11/2012  . Cardiac arrhythmia, unspecified 07/29/2012  . Contraceptive management 07/29/2012  . 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 Contraception 06/18/2012  . Fatigue 06/18/2012  .  Stye 03/21/2012  . Nausea 02/23/2012  . Fibromyalgia 01/17/2012  . Myalgia and myositis 01/17/2012  . Deficiency of other specified B group vitamins 07/19/2011  . Depression 07/19/2011  . Gastro-esophageal reflux disease without esophagitis 07/19/2011  . Generalized anxiety disorder 07/19/2011  . Low back pain 07/19/2011    History reviewed. No pertinent surgical history.   OB History   No  obstetric history on file.     Family History  Problem Relation Age of Onset  . Depression Mother   . Cancer Father        Father  . Hypertension Sister   . Cancer Paternal Grandfather        Breast   . Breast cancer Maternal Grandmother     Social History   Tobacco Use  . Smoking status: Current Every Day Smoker  . Smokeless tobacco: Never Used  . Tobacco comment: Has been smoking for 20 years  Substance Use Topics  . Alcohol use: No    Home Medications Prior to Admission medications   Medication Sig Start Date End Date Taking? Authorizing Provider  acetaminophen (TYLENOL) 500 MG tablet Take by mouth. 01/26/20   [provider]  albuterol (PROVENTIL HFA;VENTOLIN HFA) 108 (90 BASE) MCG/ACT inhaler Inhale 2 puffs into the lungs every 6 (six) hours as needed.      [provider]  ALPRAZolam [name]) 0.5 MG tablet Take 0.5 mg by mouth at bedtime as needed for anxiety.    [provider]  cyanocobalamin 1000 MCG tablet Take by mouth. 01/30/20   [provider]  doxycycline (MONODOX) 100 MG capsule Take 100 mg by mouth 2 (two) times daily. 03/20/20   [provider]  DULoxetine (CYMBALTA) 20 MG capsule Take by mouth. 03/25/20   [provider]  ergocalciferol (VITAMIN D2) 1.25 MG (50000 UT) capsule Take by mouth. 01/28/20   [provider]  FLOVENT HFA 44 MCG/ACT inhaler Inhale 2 puff using inhaler twice a day  with spacer for asthma 03/16/20   [provider]  fluconazole (DIFLUCAN) 100 MG tablet Take 100 mg by mouth daily. 04/22/20   [provider]  guaifenesin (ROBITUSSIN) 100 MG/5ML syrup Take by mouth. 04/07/20   [provider]  ibuprofen (ADVIL) 800 MG tablet Take by mouth. 01/13/20   [provider]  medroxyPROGESTERone (DEPO-PROVERA) 150 MG/ML injection Inject into the muscle. 02/10/15   [provider]  MedroxyPROGESTERone Acetate (DEPO-PROVERA IM) Inject into the muscle.       [provider]  metoprolol succinate (TOPROL-XL) 50 MG 24 hr tablet Take 50 mg by mouth 2 (two) times daily. Take with or immediately following a meal.    [provider]  pantoprazole (PROTONIX) 40 MG tablet Take 1 tablet by mouth daily. 09/16/19 09/15/20  [provider]  potassium chloride (KLOR-CON) 10 MEQ tablet Take 4 tablets in the morning, 4 tablets at lunch, and 2 tablets at dinner. 04/08/20   [provider]  promethazine (PHENERGAN) 25 MG tablet Take 0.5 tablets (12.5 mg total) by mouth every 6 (six) hours as needed for nausea or vomiting. 05/31/20   Bhagat, Crista Luria, PA    Allergies    Buspirone, Citalopram, Contrast media [iodinated diagnostic agents], Sulfa drugs cross reactors, and Sulfasalazine  Review of Systems   Review of Systems  Musculoskeletal: Positive for arthralgias. Negative for joint swelling and myalgias.  Neurological: Negative for numbness.    Physical Exam Updated Vital Signs BP 124/90   Pulse (!) 112  Temp 97.7 F (36.5 C) (Oral)   Resp 18   Ht 5\' 1"  (1.549 m)   Wt 68 kg   SpO2 100%   BMI 28.33 kg/m   Physical Exam Constitutional:      Appearance: She is well-developed and well-nourished.  HENT:     Head: Normocephalic and atraumatic.  Eyes:     Conjunctiva/sclera: Conjunctivae normal.  Cardiovascular:     Rate and Rhythm: Normal rate.  Pulmonary:     Effort: Pulmonary effort is normal. No respiratory distress.  Musculoskeletal:     Cervical back: Normal range of motion.     Comments: Examination of bilateral shoulder show positive impingement signs with Hawkins and empty can test.  She has no significant swelling both upper extremities.  No joint swelling.  She is tender along the posterior subacromial space bilaterally.  Normal internal X rotation with no discomfort.  Skin:    General: Skin is warm.     Findings: No rash.  Neurological:     Mental Status: She is alert and oriented to person,  place, and time.  Psychiatric:        Mood and Affect: Mood and affect normal.        Behavior: Behavior normal.        Thought Content: Thought content normal.     ED Results / Procedures / Treatments   Labs (all labs ordered are listed, but only abnormal results are displayed) Labs Reviewed - No data to display  EKG None  Radiology No results found.  Procedures Left and right subacromial bursa  Date/Time: 07/13/2020 5:07 PM Performed by: Duanne Guess, PA-C Authorized by: Duanne Guess, PA-C  Consent given by: patient Local anesthesia used: no  Anesthesia: Local anesthesia used: no  Sedation: Patient sedated: no  Comments: Left and right posterior subacromial space was identified and prepped with Betadine then alcohol.  A bolus of 3 cc of 1% lidocaine, 3 cc of 0.5% bupivacaine and 0.5 cc of Kenalog 40 mg was injected into the subacromial space.  Patient tolerated procedure well and saw significant improvement of pain and range of motion following procedure.      Medications Ordered in ED Medications  triamcinolone acetonide (KENALOG-40) injection 40 mg (40 mg Intra-articular Given 07/13/20 1620)  lidocaine (PF) (XYLOCAINE) 1 % injection 6 mL (6 mLs Infiltration Given 07/13/20 1620)  bupivacaine (MARCAINE) 0.5 % injection 6 mL (6 mLs Infiltration Given 07/13/20 1620)    ED Course  I have reviewed the triage vital signs and the nursing notes.  Pertinent labs & imaging results that were available during my care of the patient were reviewed by me and considered in my medical decision making (see chart for details).    MDM Rules/Calculators/A&P                          44 year old female with bilateral shoulder pain consistent with rotator cuff syndrome.  Patient requesting injections.  She is been treated with tramadol with no improvement.  Pain moderate to severe.  Patient agreed and consented to bilateral shoulder subacromial bursa injections.  She tolerated  procedures well and saw improvement with pain and range of motion.  She will follow-up with [facility] orthopedic team. Final Clinical Impression(s) / ED Diagnoses Final diagnoses:  Rotator cuff syndrome, left  Rotator cuff syndrome, right    Rx / DC Orders ED Discharge Orders    None  Duanne Guess, PA-C 07/13/20 1710    Naaman Plummer, MD 07/13/20 [phone]

## 2020-07-13 NOTE — ED Triage Notes (Signed)
Pt in with chronic bilateral shoulder pain - usually gets cortisone shots q3 mo's, but her doctor cannot fit her in today. States she has bilateral torn rotator cuffs, pain 8/10

## 2020-07-13 NOTE — Discharge Instructions (Addendum)
Please follow-up with [facility] orthopedics to schedule rotator cuff repair.  Return to the ER for any worsening symptoms or urgent changes in your health.

## 2020-08-05 ENCOUNTER — Other Ambulatory Visit: Payer: Self-pay

## 2020-08-05 ENCOUNTER — Emergency Department: Payer: Medicaid Other

## 2020-08-05 ENCOUNTER — Encounter: Payer: Self-pay | Admitting: Emergency Medicine

## 2020-08-05 ENCOUNTER — Emergency Department
Admission: EM | Admit: 2020-08-05 | Discharge: 2020-08-05 | Disposition: A | Discharge status: Home / Self Care | Payer: Medicaid Other | Attending: Emergency Medicine | Admitting: Emergency Medicine

## 2020-08-05 DIAGNOSIS — I10 Essential (primary) hypertension: Secondary | ICD-10-CM | POA: Insufficient documentation

## 2020-08-05 DIAGNOSIS — Z79899 Other long term (current) drug therapy: Secondary | ICD-10-CM | POA: Insufficient documentation

## 2020-08-05 DIAGNOSIS — F17200 Nicotine dependence, unspecified, uncomplicated: Secondary | ICD-10-CM | POA: Insufficient documentation

## 2020-08-05 DIAGNOSIS — K1120 Sialoadenitis, unspecified: Secondary | ICD-10-CM | POA: Insufficient documentation

## 2020-08-05 DIAGNOSIS — Z8541 Personal history of malignant neoplasm of cervix uteri: Secondary | ICD-10-CM | POA: Insufficient documentation

## 2020-08-05 LAB — CBC WITH DIFFERENTIAL/PLATELET
Abs Immature Granulocytes: 0.03 10*3/uL (ref 0.00–0.07)
Basophils Absolute: 0.1 10*3/uL (ref 0.0–0.1)
Basophils Relative: 1 %
Eosinophils Absolute: 0.1 10*3/uL (ref 0.0–0.5)
Eosinophils Relative: 1 %
HCT: 42.1 % (ref 36.0–46.0)
Hemoglobin: 14.4 g/dL (ref 12.0–15.0)
Immature Granulocytes: 0 %
Lymphocytes Relative: 36 %
Lymphs Abs: 4 10*3/uL (ref 0.7–4.0)
MCH: 31.2 pg (ref 26.0–34.0)
MCHC: 34.2 g/dL (ref 30.0–36.0)
MCV: 91.3 fL (ref 80.0–100.0)
Monocytes Absolute: 0.6 10*3/uL (ref 0.1–1.0)
Monocytes Relative: 6 %
Neutro Abs: 6.3 10*3/uL (ref 1.7–7.7)
Neutrophils Relative %: 56 %
Platelets: 354 10*3/uL (ref 150–400)
RBC: 4.61 MIL/uL (ref 3.87–5.11)
RDW: 12.8 % (ref 11.5–15.5)
WBC: 11 10*3/uL — ABNORMAL HIGH (ref 4.0–10.5)
nRBC: 0 % (ref 0.0–0.2)

## 2020-08-05 LAB — BASIC METABOLIC PANEL
Anion gap: 8 (ref 5–15)
BUN: 5 mg/dL — ABNORMAL LOW (ref 6–20)
CO2: 26 mmol/L (ref 22–32)
Calcium: 9.1 mg/dL (ref 8.9–10.3)
Chloride: 105 mmol/L (ref 98–111)
Creatinine, Ser: 0.9 mg/dL (ref 0.44–1.00)
GFR, Estimated: >60 mL/min (ref >60)
Glucose, Bld: 81 mg/dL (ref 70–99)
Potassium: 2.6 mmol/L — CL (ref 3.5–5.1)
Sodium: 139 mmol/L (ref 135–145)

## 2020-08-05 MED ORDER — OXYCODONE-ACETAMINOPHEN 5-325 MG PO TABS: 1.0000 | ORAL_TABLET | ORAL | 0 refills | Status: DC | PRN

## 2020-08-05 MED ORDER — MORPHINE SULFATE (PF) 4 MG/ML IV SOLN: 4.0000 mg | Freq: Once | INTRAVENOUS | Status: DC

## 2020-08-05 MED ORDER — AMOXICILLIN-POT CLAVULANATE 875-125 MG PO TABS: 1.0000 | ORAL_TABLET | Freq: Two times a day (BID) | ORAL | 0 refills | Status: AC

## 2020-08-05 MED ORDER — POTASSIUM CHLORIDE CRYS ER 20 MEQ PO TBCR
40.0000 meq | EXTENDED_RELEASE_TABLET | Freq: Once | ORAL | Status: DC
  Filled 2020-08-05: qty 2

## 2020-08-05 MED ORDER — POTASSIUM CHLORIDE 20 MEQ PO PACK
40.0000 meq | PACK | Freq: Once | ORAL | Status: AC
  Administered 2020-08-05: 40 meq via ORAL
  Filled 2020-08-05: qty 2

## 2020-08-05 MED ORDER — POTASSIUM CHLORIDE 10 MEQ/100ML IV SOLN
10.0000 meq | INTRAVENOUS | Status: DC
  Filled 2020-08-05: qty 100

## 2020-08-05 MED ORDER — MAGNESIUM SULFATE 2 GM/50ML IV SOLN
2.0000 g | Freq: Once | INTRAVENOUS | Status: DC
  Filled 2020-08-05: qty 50

## 2020-08-05 NOTE — ED Provider Notes (Signed)
[facility] Emergency Department Provider Note   ____________________________________________   Event Date/Time   First MD Initiated Contact with Patient 08/05/20 2110     (approximate)  I have reviewed the triage vital signs and the nursing notes.   HISTORY  Chief Complaint Facial Swelling    HPI Lisa Davila is a 45 y.o. female with past medical history of hyperlipidemia, generalized anxiety disorder, and sialolithiasis who presents to the ED complaining of neck swelling.  Patient reports that last night she for started to notice a "knot" under the left side of her jaw.  This knot has gradually increased in size and become more painful.  Pain seems to extend along the left side of her jaw and up towards her ear.  Pain is exacerbated when she goes to eat or drink anything.  She denies any fevers, nausea, vomiting, or sore throat.  She denies any dental pain or issues with loose teeth.  She has had similar symptoms in the past and was diagnosed with a right submandibular salivary stone for which she required surgical removal.        Past Medical History:  Diagnosis Date  . Acute ethmoidal sinusitis   . Acute ethmoidal sinusitis   . Acute maxillary sinusitis   . Acute upper respiratory infections of unspecified site   . Anxiety state, unspecified   . Chest pain, unspecified   . Depression   . Diarrhea   . Esophageal reflux   . Generalized anxiety disorder   . Hyperlipidemia   . Hypoglycemia, unspecified   . Lumbago   . Nausea with vomiting   . Obstructive chronic bronchitis with exacerbation ([place])   . Other and unspecified hyperlipidemia   . Other and unspecified peripheral vertigo(386.19)   . Other bursitis disorders   . Other malaise and fatigue   . Other specified diseases due to viruses   . Other vitamin B12 deficiency anemia   . Pain in limb   . Pernicious anemia   . Sleep related hypoventilation/hypoxemia in conditions classifiable  elsewhere   . Swelling, mass, or lump in head and neck   . Tobacco use disorder   . Unspecified disorder of external ear   . Unspecified disorder of skin and subcutaneous tissue   . Unspecified otitis media     Patient Active Problem List   Diagnosis Date Noted  . Hx of cervical cancer 06/01/2020  . Hypertension 06/01/2020  . Migraines 06/01/2020  . Sialolithiasis 06/01/2020  . Tobacco use disorder 06/01/2020  . Hoarseness 04/06/2020  . Oral thrush 04/06/2020  . Leg cramping 03/25/2020  . Iatrogenic adrenal insufficiency ([place]) 02/20/2020  . Hypokalemia 01/25/2020  . Weakness of both legs 01/25/2020  . Nontraumatic incomplete tear of left rotator cuff 01/20/2019  . Tendinitis of upper biceps tendon of left shoulder 01/20/2019  . Rotator cuff tendinitis, left 12/30/2018  . Palpitation 01/08/2018  . Hypercholesteremia 01/08/2018  . Stress at work 01/08/2018  . Trochanteric bursitis of left hip 11/30/2016  . Benzodiazepine misuse 05/03/2016  . Cervical cancer screening 05/03/2016  . Chronic sinusitis 03/16/2015  . Nasal obstruction 03/16/2015  . High risk medication use 11/03/2014  . Other long term (current) drug therapy 11/03/2014  . Polypharmacy 11/03/2014  . Arthralgia of temporomandibular joint, unspecified side 10/24/2013  . Carpal tunnel syndrome 08/04/2013  . Lateral epicondylitis 08/04/2013  . Knee pain 02/03/2013  . Muscle strain 02/03/2013  . Pain in left knee 02/03/2013  . Shoulder pain 02/03/2013  .  Dysphagia, pharyngeal 09/11/2012  . Cardiac arrhythmia, unspecified 07/29/2012  . Contraceptive management 07/29/2012  . 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 Contraception 06/18/2012  . Fatigue 06/18/2012  . Stye 03/21/2012  . Nausea 02/23/2012  . Fibromyalgia 01/17/2012  . Myalgia and myositis 01/17/2012  . Deficiency of other specified B group vitamins 07/19/2011  . Depression 07/19/2011  . Gastro-esophageal reflux disease without  esophagitis 07/19/2011  . Generalized anxiety disorder 07/19/2011  . Low back pain 07/19/2011    History reviewed. No pertinent surgical history.  Prior to Admission medications   Medication Sig Start Date End Date Taking? Authorizing Provider  amoxicillin-clavulanate (AUGMENTIN) 875-125 MG tablet Take 1 tablet by mouth 2 (two) times daily for 7 days. 08/05/20 08/12/20 Yes Blake Divine, MD  oxyCODONE-acetaminophen (PERCOCET) 5-325 MG tablet Take 1 tablet by mouth every 4 (four) hours as needed for severe pain. 08/05/20 08/05/21 Yes Blake Divine, MD  acetaminophen (TYLENOL) 500 MG tablet Take by mouth. 01/26/20   [provider]  albuterol (PROVENTIL HFA;VENTOLIN HFA) 108 (90 BASE) MCG/ACT inhaler Inhale 2 puffs into the lungs every 6 (six) hours as needed.      [provider]  ALPRAZolam [name]) 0.5 MG tablet Take 0.5 mg by mouth at bedtime as needed for anxiety.    [provider]  cyanocobalamin 1000 MCG tablet Take by mouth. 01/30/20   [provider]  doxycycline (MONODOX) 100 MG capsule Take 100 mg by mouth 2 (two) times daily. 03/20/20   [provider]  DULoxetine (CYMBALTA) 20 MG capsule Take by mouth. 03/25/20   [provider]  ergocalciferol (VITAMIN D2) 1.25 MG (50000 UT) capsule Take by mouth. 01/28/20   [provider]  FLOVENT HFA 44 MCG/ACT inhaler Inhale 2 puff using inhaler twice a day  with spacer for asthma 03/16/20   [provider]  fluconazole (DIFLUCAN) 100 MG tablet Take 100 mg by mouth daily. 04/22/20   [provider]  guaifenesin (ROBITUSSIN) 100 MG/5ML syrup Take by mouth. 04/07/20   [provider]  ibuprofen (ADVIL) 800 MG tablet Take by mouth. 01/13/20   [provider]  medroxyPROGESTERone (DEPO-PROVERA) 150 MG/ML injection Inject into the muscle. 02/10/15   [provider]  MedroxyPROGESTERone Acetate (DEPO-PROVERA IM) Inject into the muscle.      [provider]  metoprolol succinate (TOPROL-XL) 50 MG 24 hr tablet Take 50 mg by mouth 2 (two) times daily. Take with or immediately following a meal.    [provider]  pantoprazole (PROTONIX) 40 MG tablet Take 1 tablet by mouth daily. 09/16/19 09/15/20  [provider]  potassium chloride (KLOR-CON) 10 MEQ tablet Take 4 tablets in the morning, 4 tablets at lunch, and 2 tablets at dinner. 04/08/20   [provider]  promethazine (PHENERGAN) 25 MG tablet Take 0.5 tablets (12.5 mg total) by mouth every 6 (six) hours as needed for nausea or vomiting. 05/31/20   Bhagat, Crista Luria, PA    Allergies Buspirone, Citalopram, Contrast media [iodinated diagnostic agents], Sulfa drugs cross reactors, and Sulfasalazine  Family History  Problem Relation Age of Onset  . Depression Mother   . Cancer Father        Father  . Hypertension Sister   . Cancer Paternal Grandfather        Breast   . Breast cancer Maternal Grandmother     Social History Social History   Tobacco Use  . Smoking status: Current Every Day Smoker  . Smokeless  tobacco: Never Used  . Tobacco comment: Has been smoking for 20 years  Vaping Use  . Vaping Use: Never used  Substance Use Topics  . Alcohol use: No    Review of Systems  Constitutional: No fever/chills Eyes: No visual changes. ENT: No sore throat.  Positive for neck pain and swelling. Cardiovascular: Denies chest pain. Respiratory: Denies shortness of breath. Gastrointestinal: No abdominal pain.  No nausea, no vomiting.  No diarrhea.  No constipation. Genitourinary: Negative for dysuria. Musculoskeletal: Negative for back pain. Skin: Negative for rash. Neurological: Negative for headaches, focal weakness or numbness.  ____________________________________________   PHYSICAL EXAM:  VITAL SIGNS: ED Triage Vitals  Enc Vitals Group     BP 08/05/20 2103 (!) 148/103     Pulse Rate 08/05/20 2103 (!) 108     Resp 08/05/20 2103 18      Temp 08/05/20 2103 98.2 F (36.8 C)     Temp Source 08/05/20 2103 Oral     SpO2 08/05/20 2103 100 %     Weight 08/05/20 2104 170 lb (77.1 kg)     Height 08/05/20 2104 5\' 1"  (1.549 m)     Head Circumference --      Peak Flow --      Pain Score 08/05/20 2104 10     Pain Loc --      Pain Edu? --      Excl. in [place]? --     Constitutional: Alert and oriented. Eyes: Conjunctivae are normal. Head: Atraumatic. Nose: No congestion/rhinnorhea. Mouth/Throat: Mucous membranes are moist.  No dental swelling or tenderness to palpation. Ears: Left TM clear with no erythema or bulging. Neck: Normal ROM.  Left-sided submandibular swelling with central firm area that is tender to palpation.  No overlying erythema or warmth noted. Cardiovascular: Normal rate, regular rhythm. Grossly normal heart sounds. Respiratory: Normal respiratory effort.  No retractions. Lungs CTAB. Gastrointestinal: Soft and nontender. No distention. Genitourinary: deferred Musculoskeletal: No lower extremity tenderness nor edema. Neurologic:  Normal speech and language. No gross focal neurologic deficits are appreciated. Skin:  Skin is warm, dry and intact. No rash noted. Psychiatric: Mood and affect are normal. Speech and behavior are normal.  ____________________________________________   LABS (all labs ordered are listed, but only abnormal results are displayed)  Labs Reviewed  CBC WITH DIFFERENTIAL/PLATELET - Abnormal; Notable for the following components:      Result Value   WBC 11.0 (*)    All other components within normal limits  BASIC METABOLIC PANEL - Abnormal; Notable for the following components:   Potassium 2.6 (*)    BUN 5 (*)    All other components within normal limits     PROCEDURES  Procedure(s) performed (including Critical Care):  Procedures   ____________________________________________   INITIAL IMPRESSION / ASSESSMENT AND PLAN / ED COURSE       45 year old female with past  medical history of hyperlipidemia, generalized anxiety disorder, and sialolithiasis status post removal of right submandibular gland who presents to the ED with increasing pain and swelling to her left submandibular area for the past 12 hours.  Patient has an area of pain and swelling in the region of her submandibular gland with no signs of infection.  Dentition is intact without evidence of dental abscess, submandibular compartments are soft and there is no evidence of [place]'s angina.  We will further assess with CT scan, however patient has a significant allergy to IV contrast.  She states she has had allergic reaction despite pretreatment  in the past.  Imaging options discussed with radiology, who states that noncontrast CT scan is likely to be the best option available to her.  We will treat pain with IV morphine, check basic labs.  Labs remarkable for potassium of 2.6, which patient states is a chronic issue for her.  She had been taking potassium supplementation consistently up until a couple of weeks ago.  CT scan is consistent with sialoadenitis, no evidence of stone.  We will start patient on Augmentin instead of the amoxicillin that she has already been taking for a sinus infection.  She unfortunately is refusing appropriate potassium replacement here in the ED, refuses IV potassium and potassium pills as she states "they are too large."  She does not want to stay for potassium repletion beyond a single dose of liquid potassium.  She did speak with her endocrinologist office on the phone, who she states sent in a prescription for potassium supplementation for her.  She was advised that if she does not start on appropriate potassium supplementation, this could cause significant cardiac issues for her.  She continues to refuse appropriate potassium supplementation despite acknowledging risks.  She was provided with referral to ENT and counseled to return to the ED for new worsening symptoms.       ____________________________________________   FINAL CLINICAL IMPRESSION(S) / ED DIAGNOSES  Final diagnoses:  Sialoadenitis     ED Discharge Orders         Ordered    amoxicillin-clavulanate (AUGMENTIN) 875-125 MG tablet  2 times daily        08/05/20 2317    oxyCODONE-acetaminophen (PERCOCET) 5-325 MG tablet  Every 4 hours PRN        08/05/20 2318           Note:  This document was prepared using Dragon voice recognition software and may include unintentional dictation errors.   Blake Divine, MD 08/05/20 2322

## 2020-08-05 NOTE — ED Triage Notes (Signed)
Patient ambulatory to triage with steady gait, without difficulty or distress noted; pt reports "knot" to left side neck since last night; large amount swelling noted to area with diff swallowing; seen yesterday by her PCP for recent sinus congestion and dx sinusitis and rx amoxi

## 2020-10-22 ENCOUNTER — Encounter: Payer: Self-pay | Admitting: Emergency Medicine

## 2020-10-22 ENCOUNTER — Emergency Department
Admission: EM | Admit: 2020-10-22 | Discharge: 2020-10-22 | Disposition: A | Discharge status: Home / Self Care | Payer: Medicaid Other | Attending: Emergency Medicine | Admitting: Emergency Medicine

## 2020-10-22 ENCOUNTER — Other Ambulatory Visit: Payer: Self-pay

## 2020-10-22 DIAGNOSIS — Z79899 Other long term (current) drug therapy: Secondary | ICD-10-CM | POA: Insufficient documentation

## 2020-10-22 DIAGNOSIS — K219 Gastro-esophageal reflux disease without esophagitis: Secondary | ICD-10-CM | POA: Insufficient documentation

## 2020-10-22 DIAGNOSIS — K644 Residual hemorrhoidal skin tags: Secondary | ICD-10-CM | POA: Insufficient documentation

## 2020-10-22 DIAGNOSIS — Z8541 Personal history of malignant neoplasm of cervix uteri: Secondary | ICD-10-CM | POA: Insufficient documentation

## 2020-10-22 DIAGNOSIS — F17200 Nicotine dependence, unspecified, uncomplicated: Secondary | ICD-10-CM | POA: Insufficient documentation

## 2020-10-22 DIAGNOSIS — I10 Essential (primary) hypertension: Secondary | ICD-10-CM | POA: Insufficient documentation

## 2020-10-22 MED ORDER — LIDOCAINE-PRILOCAINE 2.5-2.5 % EX CREA: 1.0000 "application " | TOPICAL_CREAM | CUTANEOUS | 0 refills | Status: DC | PRN

## 2020-10-22 MED ORDER — DOCUSATE SODIUM 100 MG PO CAPS: 100.0000 mg | ORAL_CAPSULE | Freq: Every day | ORAL | 0 refills | Status: DC | PRN

## 2020-10-22 MED ORDER — HYDROCORTISONE ACETATE 25 MG RE SUPP: 25.0000 mg | Freq: Two times a day (BID) | RECTAL | 1 refills | Status: DC

## 2020-10-22 MED ORDER — LIDOCAINE-PRILOCAINE 2.5-2.5 % EX CREA
TOPICAL_CREAM | Freq: Once | CUTANEOUS | Status: AC
  Filled 2020-10-22: qty 5

## 2020-10-22 NOTE — ED Triage Notes (Signed)
Patient ambulatory to triage with steady gait, without difficulty or distress noted; pt reports hemorrhoidal pain x 4 days unrelieved by OTC remedies

## 2020-10-22 NOTE — ED Notes (Signed)
See triage note  Presents with rectal pain   States possible hemorrhoids    States she has used creams w/o relief

## 2020-10-22 NOTE — ED Provider Notes (Signed)
[facility] Emergency Department Provider Note   ____________________________________________   Event Date/Time   First MD Initiated Contact with Patient 10/22/20 [phone]     (approximate)  I have reviewed the triage vital signs and the nursing notes.   HISTORY  Chief Complaint Hemorrhoids    HPI Lisa Davila is a 45 y.o. female patient presents with a hemorrhoid and rectal pain for 4 days.  Patient no relief with over-the-counter medications.  Patient denies bleeding.  Patient rates the pain as a 10/10.  Patient describes the pain as "sore".         Past Medical History:  Diagnosis Date   Acute ethmoidal sinusitis    Acute ethmoidal sinusitis    Acute maxillary sinusitis    Acute upper respiratory infections of unspecified site    Anxiety state, unspecified    Chest pain, unspecified    Depression    Diarrhea    Esophageal reflux    Generalized anxiety disorder    Hyperlipidemia    Hypoglycemia, unspecified    Lumbago    Nausea with vomiting    Obstructive chronic bronchitis with exacerbation ([place])    Other and unspecified hyperlipidemia    Other and unspecified peripheral vertigo(386.19)    Other bursitis disorders    Other malaise and fatigue    Other specified diseases due to viruses    Other vitamin B12 deficiency anemia    Pain in limb    Pernicious anemia    Sleep related hypoventilation/hypoxemia in conditions classifiable elsewhere    Swelling, mass, or lump in head and neck    Tobacco use disorder    Unspecified disorder of external ear    Unspecified disorder of skin and subcutaneous tissue    Unspecified otitis media     Patient Active Problem List   Diagnosis Date Noted   Hx of cervical cancer 06/01/2020   Hypertension 06/01/2020   Migraines 06/01/2020   Sialolithiasis 06/01/2020   Tobacco use disorder 06/01/2020   Hoarseness 04/06/2020   Oral thrush 04/06/2020   Leg cramping 03/25/2020   Iatrogenic adrenal  insufficiency ([place]) 02/20/2020   Hypokalemia 01/25/2020   Weakness of both legs 01/25/2020   Nontraumatic incomplete tear of left rotator cuff 01/20/2019   Tendinitis of upper biceps tendon of left shoulder 01/20/2019   Rotator cuff tendinitis, left 12/30/2018   Palpitation 01/08/2018   Hypercholesteremia 01/08/2018   Stress at work 01/08/2018   Trochanteric bursitis of left hip 11/30/2016   Benzodiazepine misuse 05/03/2016   Cervical cancer screening 05/03/2016   Chronic sinusitis 03/16/2015   Nasal obstruction 03/16/2015   High risk medication use 11/03/2014   Other long term (current) drug therapy 11/03/2014   Polypharmacy 11/03/2014   Arthralgia of temporomandibular joint, unspecified side 10/24/2013   Carpal tunnel syndrome 08/04/2013   Lateral epicondylitis 08/04/2013   Knee pain 02/03/2013   Muscle strain 02/03/2013   Pain in left knee 02/03/2013   Shoulder pain 02/03/2013   Dysphagia, pharyngeal 09/11/2012   Cardiac arrhythmia, unspecified 07/29/2012   Contraceptive management 07/29/2012   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  No pertinent surgical history.  Prior to Admission medications   Medication Sig Start Date End Date Taking? Authorizing Provider  docusate sodium (COLACE) 100 MG capsule Take 1 capsule (100 mg total) by mouth daily as needed. 10/22/20 10/22/21 Yes Sable Feil, PA-C  hydrocortisone (ANUSOL-HC) 25 MG suppository Place 1 suppository (25 mg total) rectally every 12 (twelve) hours. 10/22/20 10/22/21 Yes Sable Feil, PA-C  lidocaine-prilocaine  (EMLA) cream Apply 1 application topically as needed. 10/22/20  Yes Sable Feil, PA-C  acetaminophen (TYLENOL) 500 MG tablet Take by mouth. 01/26/20   [provider]  albuterol (PROVENTIL HFA;VENTOLIN HFA) 108 (90 BASE) MCG/ACT inhaler Inhale 2 puffs into the lungs every 6 (six) hours as needed.      [provider]  ALPRAZolam [name]) 0.5 MG tablet Take 0.5 mg by mouth at bedtime as needed for anxiety.    [provider]  cyanocobalamin 1000 MCG tablet Take by mouth. 01/30/20   [provider]  doxycycline (MONODOX) 100 MG capsule Take 100 mg by mouth 2 (two) times daily. 03/20/20   [provider]  DULoxetine (CYMBALTA) 20 MG capsule Take by mouth. 03/25/20   [provider]  ergocalciferol (VITAMIN D2) 1.25 MG (50000 UT) capsule Take by mouth. 01/28/20   [provider]  FLOVENT HFA 44 MCG/ACT inhaler Inhale 2 puff using inhaler twice a day  with spacer for asthma 03/16/20   [provider]  fluconazole (DIFLUCAN) 100 MG tablet Take 100 mg by mouth daily. 04/22/20   [provider]  guaifenesin (ROBITUSSIN) 100 MG/5ML syrup Take by mouth. 04/07/20   [provider]  ibuprofen (ADVIL) 800 MG tablet Take by mouth. 01/13/20   [provider]  medroxyPROGESTERone (DEPO-PROVERA) 150 MG/ML injection Inject into the muscle. 02/10/15   [provider]  MedroxyPROGESTERone Acetate (DEPO-PROVERA IM) Inject into the muscle.      [provider]  metoprolol succinate (TOPROL-XL) 50 MG 24 hr tablet Take 50 mg by mouth 2 (two) times daily. Take with or immediately following a meal.    [provider]  Molnupiravir 200 MG CAPS TAKE 4 CAPSULES BY MOUTH 2 TIMES DAILY X 5 DAYS 05/31/20 05/31/21  Bhagat, Crista Luria, PA  oxyCODONE-acetaminophen (PERCOCET) 5-325 MG tablet Take 1 tablet by mouth every 4 (four) hours as needed for severe pain. 08/05/20 08/05/21  Blake Divine, MD  pantoprazole  (PROTONIX) 40 MG tablet Take 1 tablet by mouth daily. 09/16/19 09/15/20  [provider]  potassium chloride (KLOR-CON) 10 MEQ tablet Take 4 tablets in the morning, 4 tablets at lunch, and 2 tablets at dinner. 04/08/20   [provider]  promethazine (PHENERGAN) 25 MG tablet TAKE 1 TABLET BY MOUTH EVERY 8 HOURS AS NEEDED FOR NAUSEA 05/31/20 05/31/21  Bhagat, Crista Luria, PA    Allergies Buspirone, Citalopram, Contrast media [iodinated diagnostic agents], Sulfa drugs cross reactors, and Sulfasalazine  Family History  Problem Relation Age of Onset   Depression Mother    Cancer Father        Father   Hypertension Sister    Cancer Paternal Grandfather        Breast    Breast cancer Maternal Grandmother     Social History Social History   Tobacco Use   Smoking status: Every Day    Pack years: 0.00   Smokeless tobacco: Never   Tobacco comments:    Has been smoking for 20 years  Vaping Use   Vaping Use: Never used  Substance  Use Topics   Alcohol use: No    Review of Systems  Constitutional: No fever/chills Eyes: No visual changes. ENT: No sore throat. Cardiovascular: Denies chest pain. Respiratory: Denies shortness of breath. Gastrointestinal: No abdominal pain.  No nausea, no vomiting.  No diarrhea.  No constipation.  Rectal hemorrhoid Genitourinary: Negative for dysuria. Musculoskeletal: Negative for back pain. Skin: Negative for rash. Neurological: Negative for headaches, focal weakness or numbness. Psychiatric: Anxiety and depression Endocrine: Hyperlipidemia Hematological/Lymphatic: Anemia Allergic/Immunilogical: BuSpar,Citalopram, IVP dye, and sulfa. ____________________________________________   PHYSICAL EXAM:  VITAL SIGNS: ED Triage Vitals [10/22/20 0609]  Enc Vitals Group     BP (!) 131/100     Pulse Rate (!) 109     Resp 18     Temp 97.8 F (36.6 C)     Temp Source Oral     SpO2 100 %     Weight 180 lb (81.6 kg)     Height 5\' 1"  (1.549  m)     Head Circumference      Peak Flow      Pain Score 10     Pain Loc      Pain Edu?      Excl. in [place]?     Constitutional: Alert and oriented.  Mild distress.  Cardiovascular: Normal rate, regular rhythm. Grossly normal heart sounds.  Good peripheral circulation. Respiratory: Normal respiratory effort.  No retractions. Lungs CTAB. Gastrointestinal: Soft and nontender. No distention. No abdominal bruits. No CVA tenderness.  Chaperoned exam reveals nonthrombosed external rectal hemorrhoid Genitourinary: Deferred Musculoskeletal: No lower extremity tenderness nor edema.  No joint effusions. Neurologic:  Normal speech and language. No gross focal neurologic deficits are appreciated. No gait instability. Skin:  Skin is warm, dry and intact. No rash noted. Psychiatric: Mood and affect are normal. Speech and behavior are normal.  ____________________________________________   LABS (all labs ordered are listed, but only abnormal results are displayed)  Labs Reviewed - No data to display ____________________________________________  EKG   ____________________________________________  RADIOLOGY I, Sable Feil, personally viewed and evaluated these images (plain radiographs) as part of my medical decision making, as well as reviewing the written report by the radiologist.  ED MD interpretation:    Official radiology report(s): No results found.  ____________________________________________   PROCEDURES  Procedure(s) performed (including Critical Care):  Procedures   ____________________________________________   INITIAL IMPRESSION / ASSESSMENT AND PLAN / ED COURSE  As part of my medical decision making, I reviewed the following data within the [place] with rectal pain secondary to external hemorrhoid.  Patient given discharge care instructions advised to take medication as directed.  Patient advised follow-up with surgical  clinic listed on discharge care instruction if worsening of complaint within 1 week.      ____________________________________________   FINAL CLINICAL IMPRESSION(S) / ED DIAGNOSES  Final diagnoses:  External hemorrhoid     ED Discharge Orders          Ordered    lidocaine-prilocaine (EMLA) cream  As needed        10/22/20 0729    hydrocortisone (ANUSOL-HC) 25 MG suppository  Every 12 hours        10/22/20 0729    docusate sodium (COLACE) 100 MG capsule  Daily PRN        10/22/20 0729             Note:  This document was prepared using Dragon voice recognition software  and may include unintentional dictation errors.    Sable Feil, PA-C 10/22/20 5670    Lavonia Drafts, MD 10/22/20 0800

## 2020-10-22 NOTE — Discharge Instructions (Addendum)
Read and follow discharge care instruction take medications as directed.

## 2020-11-05 ENCOUNTER — Other Ambulatory Visit: Payer: Self-pay

## 2020-11-05 ENCOUNTER — Emergency Department
Admission: EM | Admit: 2020-11-05 | Discharge: 2020-11-05 | Disposition: A | Discharge status: Home / Self Care | Payer: Medicaid Other | Attending: Emergency Medicine | Admitting: Emergency Medicine

## 2020-11-05 DIAGNOSIS — G8929 Other chronic pain: Secondary | ICD-10-CM | POA: Insufficient documentation

## 2020-11-05 DIAGNOSIS — M25511 Pain in right shoulder: Secondary | ICD-10-CM | POA: Insufficient documentation

## 2020-11-05 DIAGNOSIS — Z8541 Personal history of malignant neoplasm of cervix uteri: Secondary | ICD-10-CM | POA: Insufficient documentation

## 2020-11-05 DIAGNOSIS — Z79899 Other long term (current) drug therapy: Secondary | ICD-10-CM | POA: Insufficient documentation

## 2020-11-05 DIAGNOSIS — F17200 Nicotine dependence, unspecified, uncomplicated: Secondary | ICD-10-CM | POA: Insufficient documentation

## 2020-11-05 DIAGNOSIS — I10 Essential (primary) hypertension: Secondary | ICD-10-CM | POA: Insufficient documentation

## 2020-11-05 DIAGNOSIS — J441 Chronic obstructive pulmonary disease with (acute) exacerbation: Secondary | ICD-10-CM | POA: Insufficient documentation

## 2020-11-05 DIAGNOSIS — M25512 Pain in left shoulder: Secondary | ICD-10-CM | POA: Insufficient documentation

## 2020-11-05 MED ORDER — HYDROCODONE-ACETAMINOPHEN 5-325 MG PO TABS: 1.0000 | ORAL_TABLET | Freq: Four times a day (QID) | ORAL | 0 refills | Status: DC | PRN

## 2020-11-05 MED ORDER — PREDNISONE 10 MG PO TABS: ORAL_TABLET | ORAL | 0 refills | Status: DC

## 2020-11-05 NOTE — ED Notes (Signed)
See triage note  Presents with pain to both shoulder areas  States her provider is not available  States she gets cortisone shots    Last shot was 4 months ago

## 2020-11-05 NOTE — ED Provider Notes (Signed)
[facility] Emergency Department Provider Note  ____________________________________________   Event Date/Time   First MD Initiated Contact with Patient 11/05/20 [phone]     (approximate)  I have reviewed the triage vital signs and the nursing notes.   HISTORY  Chief Complaint Shoulder Pain   HPI Lisa Davila is a 45 y.o. female presents to the ED with complaint of chronic bilateral shoulder pain.  Patient states that she gets cortisone injections into her shoulders approximately every 3 to 4 months at the orthopedic department in [facility].  She states that she called and was unable to get an appointment and is here for her injections.  Currently she rates her pain as 10/10.         Past Medical History:  Diagnosis Date   Acute ethmoidal sinusitis    Acute ethmoidal sinusitis    Acute maxillary sinusitis    Acute upper respiratory infections of unspecified site    Anxiety state, unspecified    Chest pain, unspecified    Depression    Diarrhea    Esophageal reflux    Generalized anxiety disorder    Hyperlipidemia    Hypoglycemia, unspecified    Lumbago    Nausea with vomiting    Obstructive chronic bronchitis with exacerbation ([place])    Other and unspecified hyperlipidemia    Other and unspecified peripheral vertigo(386.19)    Other bursitis disorders    Other malaise and fatigue    Other specified diseases due to viruses    Other vitamin B12 deficiency anemia    Pain in limb    Pernicious anemia    Sleep related hypoventilation/hypoxemia in conditions classifiable elsewhere    Swelling, mass, or lump in head and neck    Tobacco use disorder    Unspecified disorder of external ear    Unspecified disorder of skin and subcutaneous tissue    Unspecified otitis media     Patient Active Problem List   Diagnosis Date Noted   Hx of cervical cancer 06/01/2020   Hypertension 06/01/2020   Migraines 06/01/2020   Sialolithiasis  06/01/2020   Tobacco use disorder 06/01/2020   Hoarseness 04/06/2020   Oral thrush 04/06/2020   Leg cramping 03/25/2020   Iatrogenic adrenal insufficiency ([place]) 02/20/2020   Hypokalemia 01/25/2020   Weakness of both legs 01/25/2020   Nontraumatic incomplete tear of left rotator cuff 01/20/2019   Tendinitis of upper biceps tendon of left shoulder 01/20/2019   Rotator cuff tendinitis, left 12/30/2018   Palpitation 01/08/2018   Hypercholesteremia 01/08/2018   Stress at work 01/08/2018   Trochanteric bursitis of left hip 11/30/2016   Benzodiazepine misuse 05/03/2016   Cervical cancer screening 05/03/2016   Chronic sinusitis 03/16/2015   Nasal obstruction 03/16/2015   High risk medication use 11/03/2014   Other long term (current) drug therapy 11/03/2014   Polypharmacy 11/03/2014   Arthralgia of temporomandibular joint, unspecified side 10/24/2013   Carpal tunnel syndrome 08/04/2013   Lateral epicondylitis 08/04/2013   Knee pain 02/03/2013   Muscle strain 02/03/2013   Pain in left knee 02/03/2013   Shoulder pain 02/03/2013   Dysphagia, pharyngeal 09/11/2012   Cardiac arrhythmia, unspecified 07/29/2012   Contraceptive management 07/29/2012   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 No pertinent surgical history.  Prior to Admission medications   Medication Sig Start Date End Date Taking? Authorizing Provider  HYDROcodone-acetaminophen (NORCO/VICODIN) 5-325 MG tablet Take 1 tablet by mouth every 6 (six) hours as needed for moderate pain. 11/05/20 11/05/21 Yes Antwoin Lackey L, PA-C   predniSONE (DELTASONE) 10 MG tablet Take 6 tablets  today, on day 2 take 5 tablets, day 3 take 4 tablets, day 4 take 3 tablets, day 5 take  2 tablets and 1 tablet the last day 11/05/20  Yes [name], PA-C  acetaminophen (TYLENOL) 500 MG tablet Take by mouth. 01/26/20   [provider]  albuterol (PROVENTIL HFA;VENTOLIN HFA) 108 (90 BASE) MCG/ACT inhaler Inhale 2 puffs into the lungs every 6 (six) hours as needed.      [provider]  ALPRAZolam [name]) 0.5 MG tablet Take 0.5 mg by mouth at bedtime as needed for anxiety.    [provider]  cyanocobalamin 1000 MCG tablet Take by mouth. 01/30/20   [provider]  docusate sodium (COLACE) 100 MG capsule Take 1 capsule (100 mg total) by mouth daily as needed. 10/22/20 10/22/21  Sable Feil, PA-C  doxycycline (MONODOX) 100 MG capsule Take 100 mg by mouth 2 (two) times daily. 03/20/20   [provider]  DULoxetine (CYMBALTA) 20 MG capsule Take by mouth. 03/25/20   [provider]  ergocalciferol (VITAMIN D2) 1.25 MG (50000 UT) capsule Take by mouth. 01/28/20   [provider]  FLOVENT HFA 44 MCG/ACT inhaler Inhale 2 puff using inhaler twice a day  with spacer for asthma 03/16/20   [provider]  fluconazole (DIFLUCAN) 100 MG tablet Take 100 mg by mouth daily. 04/22/20   [provider]  guaifenesin (ROBITUSSIN) 100 MG/5ML syrup Take by mouth. 04/07/20   [provider]  hydrocortisone (ANUSOL-HC) 25 MG suppository Place 1 suppository (25 mg total) rectally every 12 (twelve) hours. 10/22/20 10/22/21  Sable Feil, PA-C  ibuprofen (ADVIL) 800 MG tablet Take by mouth. 01/13/20   [provider]  lidocaine-prilocaine (EMLA) cream Apply 1 application topically as needed. 10/22/20   Sable Feil, PA-C  medroxyPROGESTERone (DEPO-PROVERA) 150 MG/ML injection Inject into the muscle. 02/10/15   [provider]  MedroxyPROGESTERone Acetate  (DEPO-PROVERA IM) Inject into the muscle.      [provider]  metoprolol succinate (TOPROL-XL) 50 MG 24 hr tablet Take 50 mg by mouth 2 (two) times daily. Take with or immediately following a meal.    [provider]  Molnupiravir 200 MG CAPS TAKE 4 CAPSULES BY MOUTH 2 TIMES DAILY X 5 DAYS 05/31/20 05/31/21  Bhagat, Crista Luria, PA  pantoprazole (PROTONIX) 40 MG tablet Take 1 tablet by mouth daily. 09/16/19 09/15/20  [provider]  potassium chloride (KLOR-CON) 10 MEQ tablet Take 4 tablets in the morning, 4 tablets at lunch, and 2 tablets at dinner. 04/08/20   [provider]    Allergies Buspirone, Citalopram, Contrast media [iodinated diagnostic agents], Sulfa drugs cross reactors, and Sulfasalazine  Family History  Problem Relation Age of Onset   Depression Mother    Cancer Father        Father   Hypertension Sister    Cancer Paternal Grandfather        Breast    Breast cancer Maternal Grandmother     Social History Social  History   Tobacco Use   Smoking status: Every Day    Pack years: 0.00   Smokeless tobacco: Never   Tobacco comments:    Has been smoking for 20 years  Vaping Use   Vaping Use: Never used  Substance Use Topics   Alcohol use: No    Review of Systems Constitutional: No fever/chills Eyes: No visual changes. Cardiovascular: Denies chest pain. Respiratory: Denies shortness of breath. Musculoskeletal: Positive for chronic bilateral shoulder pain. Skin: Negative for rash. Neurological: Negative for headaches, focal weakness or numbness.  ____________________________________________   PHYSICAL EXAM:  VITAL SIGNS: ED Triage Vitals  Enc Vitals Group     BP 11/05/20 0904 (!) 119/92     Pulse Rate 11/05/20 0904 (!) 108     Resp 11/05/20 0904 17     Temp 11/05/20 0909 97.9 F (36.6 C)     Temp Source 11/05/20 0909 Oral     SpO2 11/05/20 0904 100 %     Weight 11/05/20 0904 150 lb (68 kg)     Height 11/05/20 0904 5'  1" (1.549 m)     Head Circumference --      Peak Flow --      Pain Score 11/05/20 0904 10     Pain Loc --      Pain Edu? --      Excl. in [place]? --     Constitutional: Alert and oriented. Well appearing and in no acute distress. Eyes: Conjunctivae are normal.  Head: Atraumatic. Neck: No stridor.   Cardiovascular: Normal rate, regular rhythm. Grossly normal heart sounds.  Good peripheral circulation. Respiratory: Normal respiratory effort.  No retractions. Lungs CTAB. Musculoskeletal: On exam there is no gross deformity of the shoulders.  There is generalized tenderness on palpation and range of motion is restricted secondary to increased pain.  Pulses are present distally.  Skin is intact and no erythema or warmth is noted. Neurologic:  Normal speech and language. No gross focal neurologic deficits are appreciated. No gait instability. Skin:  Skin is warm, dry and intact. No rash noted. Psychiatric: Mood and affect are normal. Speech and behavior are normal.  ____________________________________________   LABS (all labs ordered are listed, but only abnormal results are displayed)  Labs Reviewed - No data to display ____________________________________________   PROCEDURES  Procedure(s) performed (including Critical Care):  Procedures   ____________________________________________   INITIAL IMPRESSION / ASSESSMENT AND PLAN / ED COURSE  As part of my medical decision making, I reviewed the following data within the electronic MEDICAL RECORD NUMBER Notes from prior ED visits and [place] Controlled Substance Database  45 year old female presents to the ED requesting injections to both her shoulders for chronic bilateral shoulder pain.  Patient states that she was able to get an appointment with her orthopedist.  She indicates that she gets cortisone injections from Dr. Roland Rack proximately every 3 to 4 months.  I explained to her that we could do a prednisone Dosepak and pain medication for 2  days until the prednisone is helping.  Patient still is encouraged to make an appointment with Dr. Roland Rack and see him in the office.   ____________________________________________   FINAL CLINICAL IMPRESSION(S) / ED DIAGNOSES  Final diagnoses:  Chronic pain of both shoulders     ED Discharge Orders          Ordered    predniSONE (DELTASONE) 10 MG tablet        11/05/20 1014    HYDROcodone-acetaminophen (NORCO/VICODIN) 5-325 MG  tablet  Every 6 hours PRN        11/05/20 1014             Note:  This document was prepared using Dragon voice recognition software and may include unintentional dictation errors.    [name], PA-C 11/05/20 1108    Blake Divine, MD 11/06/20 [phone]

## 2020-11-05 NOTE — ED Triage Notes (Signed)
Pt c/o chronic pain in BL shoulders she normally gets cortisone injections for but is unable to get in to see her provider

## 2020-11-05 NOTE — Discharge Instructions (Addendum)
Call Dr. Nicholaus Bloom  office and make an appointment.  A prescription for prednisone Dosepak was sent to your pharmacy along with a prescription for hydrocodone-acetaminophen as needed for pain.  This can be taken every 6 hours if needed for moderate pain or 1 at bedtime depending on your pain level.  Do not drive or operate machinery while taking the pain medication as it could cause drowsiness and increase your risk for injury.

## 2020-12-07 ENCOUNTER — Emergency Department: Payer: Medicaid Other

## 2020-12-07 ENCOUNTER — Other Ambulatory Visit: Payer: Self-pay

## 2020-12-07 ENCOUNTER — Emergency Department
Admission: EM | Admit: 2020-12-07 | Discharge: 2020-12-07 | Disposition: A | Discharge status: Home / Self Care | Payer: Medicaid Other | Attending: Emergency Medicine | Admitting: Emergency Medicine

## 2020-12-07 DIAGNOSIS — F17200 Nicotine dependence, unspecified, uncomplicated: Secondary | ICD-10-CM | POA: Insufficient documentation

## 2020-12-07 DIAGNOSIS — Z85828 Personal history of other malignant neoplasm of skin: Secondary | ICD-10-CM | POA: Insufficient documentation

## 2020-12-07 DIAGNOSIS — R0789 Other chest pain: Secondary | ICD-10-CM | POA: Insufficient documentation

## 2020-12-07 DIAGNOSIS — R059 Cough, unspecified: Secondary | ICD-10-CM | POA: Insufficient documentation

## 2020-12-07 DIAGNOSIS — I10 Essential (primary) hypertension: Secondary | ICD-10-CM | POA: Insufficient documentation

## 2020-12-07 DIAGNOSIS — Z79899 Other long term (current) drug therapy: Secondary | ICD-10-CM | POA: Insufficient documentation

## 2020-12-07 DIAGNOSIS — R000 Tachycardia, unspecified: Secondary | ICD-10-CM | POA: Insufficient documentation

## 2020-12-07 DIAGNOSIS — J449 Chronic obstructive pulmonary disease, unspecified: Secondary | ICD-10-CM | POA: Insufficient documentation

## 2020-12-07 DIAGNOSIS — R079 Chest pain, unspecified: Secondary | ICD-10-CM

## 2020-12-07 LAB — CBC
HCT: 41.9 % (ref 36.0–46.0)
Hemoglobin: 14.7 g/dL (ref 12.0–15.0)
MCH: 32.9 pg (ref 26.0–34.0)
MCHC: 35.1 g/dL (ref 30.0–36.0)
MCV: 93.7 fL (ref 80.0–100.0)
Platelets: 332 10*3/uL (ref 150–400)
RBC: 4.47 MIL/uL (ref 3.87–5.11)
RDW: 12.7 % (ref 11.5–15.5)
WBC: 10.4 10*3/uL (ref 4.0–10.5)
nRBC: 0 % (ref 0.0–0.2)

## 2020-12-07 LAB — BASIC METABOLIC PANEL
Anion gap: 8 (ref 5–15)
BUN: 6 mg/dL (ref 6–20)
CO2: 27 mmol/L (ref 22–32)
Calcium: 9.2 mg/dL (ref 8.9–10.3)
Chloride: 101 mmol/L (ref 98–111)
Creatinine, Ser: 0.83 mg/dL (ref 0.44–1.00)
GFR, Estimated: >60 mL/min (ref >60)
Glucose, Bld: 102 mg/dL — ABNORMAL HIGH (ref 70–99)
Potassium: 3.2 mmol/L — ABNORMAL LOW (ref 3.5–5.1)
Sodium: 136 mmol/L (ref 135–145)

## 2020-12-07 LAB — TROPONIN I (HIGH SENSITIVITY)
Troponin I (High Sensitivity): 5 ng/L (ref <18)
Troponin I (High Sensitivity): 4 ng/L (ref <18)

## 2020-12-07 MED ORDER — PREDNISONE 10 MG (21) PO TBPK: ORAL_TABLET | Freq: Every day | ORAL | 0 refills | Status: AC

## 2020-12-07 MED ORDER — PREDNISONE 10 MG (21) PO TBPK: ORAL_TABLET | Freq: Every day | ORAL | 0 refills | Status: DC

## 2020-12-07 MED ORDER — AZITHROMYCIN 250 MG PO TABS: ORAL_TABLET | ORAL | 0 refills | Status: DC

## 2020-12-07 MED ORDER — PREDNISONE 20 MG PO TABS
60.0000 mg | ORAL_TABLET | Freq: Once | ORAL | Status: AC
  Administered 2020-12-07: 60 mg via ORAL
  Filled 2020-12-07: qty 3

## 2020-12-07 NOTE — ED Notes (Signed)
Pt given sandwich tray 

## 2020-12-07 NOTE — ED Triage Notes (Signed)
Pt to ED for centralized cp for 3 days that worsened today with shob Pt ambulatory, nad noted Hx irregular hr Denies n/v

## 2020-12-07 NOTE — ED Triage Notes (Signed)
Presents with chest pressure for the past 3 days    "feels like someone is sitting on chest"  cough   no fever

## 2020-12-07 NOTE — ED Provider Notes (Signed)
[facility]-EMERGENCY DEPARTMENT  ____________________________________________  Time seen: Approximately 7:41 PM  I have reviewed the triage vital signs and the nursing notes.   HISTORY  Chief Complaint Chest Pain   Historian Patient     HPI Lisa Davila is a 45 y.o. female with a history of COPD and, chest pain and anxiety, presents to the emergency department with centralized chest pain for the past 3 days.  Patient reports that she had a viral URI-like illness with cough and had similar chest discomfort with cough.  Patient reports that chest discomfort has persisted and reminds her of episodes of bronchitis she has had in the past.  She reports that she has had some shortness of breath with exertion.  No chest tightness.  She denies fever at home but did have febrile several days ago.  She reports that she is a daily smoker.  No recent travel, prolonged immobilization, recent surgery or lower extremity swelling.  No prior history of DVT or PE.  Patient denies a family history of heart disease.  No other alleviating measures have been attempted.   Past Medical History:  Diagnosis Date   Acute ethmoidal sinusitis    Acute ethmoidal sinusitis    Acute maxillary sinusitis    Acute upper respiratory infections of unspecified site    Anxiety state, unspecified    Chest pain, unspecified    Depression    Diarrhea    Esophageal reflux    Generalized anxiety disorder    Hyperlipidemia    Hypoglycemia, unspecified    Lumbago    Nausea with vomiting    Obstructive chronic bronchitis with exacerbation (HCC)    Other and unspecified hyperlipidemia    Other and unspecified peripheral vertigo(386.19)    Other bursitis disorders    Other malaise and fatigue    Other specified diseases due to viruses    Other vitamin B12 deficiency anemia    Pain in limb    Pernicious anemia    Sleep related hypoventilation/hypoxemia in conditions classifiable elsewhere    Swelling, mass, or lump  in head and neck    Tobacco use disorder    Unspecified disorder of external ear    Unspecified disorder of skin and subcutaneous tissue    Unspecified otitis media      Immunizations up to date:  Yes.     Past Medical History:  Diagnosis Date   Acute ethmoidal sinusitis    Acute ethmoidal sinusitis    Acute maxillary sinusitis    Acute upper respiratory infections of unspecified site    Anxiety state, unspecified    Chest pain, unspecified    Depression    Diarrhea    Esophageal reflux    Generalized anxiety disorder    Hyperlipidemia    Hypoglycemia, unspecified    Lumbago    Nausea with vomiting    Obstructive chronic bronchitis with exacerbation (HCC)    Other and unspecified hyperlipidemia    Other and unspecified peripheral vertigo(386.19)    Other bursitis disorders    Other malaise and fatigue    Other specified diseases due to viruses    Other vitamin B12 deficiency anemia    Pain in limb    Pernicious anemia    Sleep related hypoventilation/hypoxemia in conditions classifiable elsewhere    Swelling, mass, or lump in head and neck    Tobacco use disorder    Unspecified disorder of external ear    Unspecified disorder of skin and subcutaneous tissue  Unspecified otitis media     Patient Active Problem List   Diagnosis Date Noted   Hx of cervical cancer 06/01/2020   Hypertension 06/01/2020   Migraines 06/01/2020   Sialolithiasis 06/01/2020   Tobacco use disorder 06/01/2020   Hoarseness 04/06/2020   Oral thrush 04/06/2020   Leg cramping 03/25/2020   Iatrogenic adrenal insufficiency ([place]) 02/20/2020   Hypokalemia 01/25/2020   Weakness of both legs 01/25/2020   Nontraumatic incomplete tear of left rotator cuff 01/20/2019   Tendinitis of upper biceps tendon of left shoulder 01/20/2019   Rotator cuff tendinitis, left 12/30/2018   Palpitation 01/08/2018   Hypercholesteremia 01/08/2018   Stress at work 01/08/2018   Trochanteric bursitis of left hip  11/30/2016   Benzodiazepine misuse 05/03/2016   Cervical cancer screening 05/03/2016   Chronic sinusitis 03/16/2015   Nasal obstruction 03/16/2015   High risk medication use 11/03/2014   Other long term (current) drug therapy 11/03/2014   Polypharmacy 11/03/2014   Arthralgia of temporomandibular joint, unspecified side 10/24/2013   Carpal tunnel syndrome 08/04/2013   Lateral epicondylitis 08/04/2013   Knee pain 02/03/2013   Muscle strain 02/03/2013   Pain in left knee 02/03/2013   Shoulder pain 02/03/2013   Dysphagia, pharyngeal 09/11/2012   Cardiac arrhythmia, unspecified 07/29/2012   Contraceptive management 07/29/2012   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  Prior to Admission medications   Medication Sig Start Date End Date Taking? Authorizing Provider  acetaminophen (TYLENOL) 500 MG tablet Take by mouth. 01/26/20   [provider]  albuterol (PROVENTIL HFA;VENTOLIN HFA) 108 (90 BASE) MCG/ACT inhaler Inhale 2 puffs into the lungs every 6 (six) hours as needed.      [provider]  ALPRAZolam [name]) 0.5 MG tablet Take 0.5 mg by mouth at bedtime as needed for anxiety.    [provider]  azithromycin (ZITHROMAX Z-PAK) 250 MG tablet Take two tablets the first day. Then take one tablet daily for the next four days. 12/07/20   Lannie Fields, PA-C  cyanocobalamin 1000 MCG tablet Take by mouth. 01/30/20   [provider]  docusate sodium (COLACE) 100 MG capsule Take 1 capsule (100 mg total) by mouth daily as needed. 10/22/20 10/22/21   Sable Feil, PA-C  doxycycline (MONODOX) 100 MG capsule Take 100 mg by mouth 2 (two) times daily. 03/20/20   [provider]  DULoxetine (CYMBALTA) 20 MG capsule Take by mouth. 03/25/20   [provider]  ergocalciferol (VITAMIN D2) 1.25 MG (50000 UT) capsule Take by mouth. 01/28/20   [provider]  FLOVENT HFA 44 MCG/ACT inhaler Inhale 2 puff using inhaler twice a day  with spacer for asthma 03/16/20   [provider]  fluconazole (DIFLUCAN) 100 MG tablet Take 100 mg by mouth daily. 04/22/20   [provider]  guaifenesin (ROBITUSSIN) 100 MG/5ML syrup Take by mouth. 04/07/20   [provider]  HYDROcodone-acetaminophen (NORCO/VICODIN) 5-325 MG tablet Take 1 tablet by mouth every 6 (six) hours as needed for moderate pain. 11/05/20 11/05/21  [name], PA-C  hydrocortisone (ANUSOL-HC) 25 MG suppository  Place 1 suppository (25 mg total) rectally every 12 (twelve) hours. 10/22/20 10/22/21  Sable Feil, PA-C  ibuprofen (ADVIL) 800 MG tablet Take by mouth. 01/13/20   [provider]  lidocaine-prilocaine (EMLA) cream Apply 1 application topically as needed. 10/22/20   Sable Feil, PA-C  medroxyPROGESTERone (DEPO-PROVERA) 150 MG/ML injection Inject into the muscle. 02/10/15   [provider]  MedroxyPROGESTERone Acetate (DEPO-PROVERA IM) Inject into the muscle.      [provider]  metoprolol succinate (TOPROL-XL) 50 MG 24 hr tablet Take 50 mg by mouth 2 (two) times daily. Take with or immediately following a meal.    [provider]  Molnupiravir 200 MG CAPS TAKE 4 CAPSULES BY MOUTH 2 TIMES DAILY X 5 DAYS 05/31/20 05/31/21  Bhagat, Crista Luria, PA  pantoprazole (PROTONIX) 40 MG tablet Take 1 tablet by mouth daily. 09/16/19 09/15/20  [provider]  potassium chloride (KLOR-CON) 10 MEQ tablet Take 4 tablets in the morning, 4 tablets at lunch, and 2 tablets at dinner. 04/08/20   [provider]   predniSONE (STERAPRED UNI-PAK 21 TAB) 10 MG (21) TBPK tablet Take by mouth daily for 6 days. 6,5,4,3,2,1 12/07/20 12/13/20  Lannie Fields, PA-C    Allergies Buspirone, Citalopram, Contrast media [iodinated diagnostic agents], Sulfa drugs cross reactors, and Sulfasalazine  Family History  Problem Relation Age of Onset   Depression Mother    Cancer Father        Father   Hypertension Sister    Cancer Paternal Grandfather        Breast    Breast cancer Maternal Grandmother     Social History Social History   Tobacco Use   Smoking status: Every Day   Smokeless tobacco: Never   Tobacco comments:    Has been smoking for 20 years  Vaping Use   Vaping Use: Never used  Substance Use Topics   Alcohol use: No     Review of Systems  Constitutional: No fever/chills Eyes:  No discharge ENT: No upper respiratory complaints. Respiratory: no cough. No SOB/ use of accessory muscles to breath Cardiac: Patient has chest pain.  Gastrointestinal:   No nausea, no vomiting.  No diarrhea.  No constipation. Musculoskeletal: Negative for musculoskeletal pain. Skin: Negative for rash, abrasions, lacerations, ecchymosis.    ____________________________________________   PHYSICAL EXAM:  VITAL SIGNS: ED Triage Vitals  Enc Vitals Group     BP 12/07/20 1657 132/85     Pulse Rate 12/07/20 1657 (!) 103     Resp 12/07/20 1657 18     Temp 12/07/20 1657 98 F (36.7 C)     Temp Source 12/07/20 1657 Oral     SpO2 12/07/20 1657 100 %     Weight 12/07/20 1659 149 lb 14.6 oz (68 kg)     Height 12/07/20 1659 '5\' 1"'$  (1.549 m)     Head Circumference --      Peak Flow --      Pain Score 12/07/20 1659 10     Pain Loc --      Pain Edu? --      Excl. in [place]? --      Constitutional: Alert and oriented. Well appearing and in no acute distress. Eyes: Conjunctivae are normal. PERRL. EOMI. Head: Atraumatic. ENT:      Nose: No congestion/rhinnorhea.      Mouth/Throat: Mucous membranes are moist.   Neck: No stridor.  No cervical spine tenderness to palpation. Cardiovascular: Normal rate, regular rhythm. Normal  S1 and S2.  Good peripheral circulation. Respiratory: Normal respiratory effort without tachypnea or retractions. Lungs CTAB. Good air entry to the bases with no decreased or absent breath sounds Gastrointestinal: Bowel sounds x 4 quadrants. Soft and nontender to palpation. No guarding or rigidity. No distention. Musculoskeletal: Full range of motion to all extremities. No obvious deformities noted Neurologic:  Normal for age. No gross focal neurologic deficits are appreciated.  Skin:  Skin is warm, dry and intact. No rash noted. Psychiatric: Mood and affect are normal for age. Speech and behavior are normal.   ____________________________________________   LABS (all labs ordered are listed, but only abnormal results are displayed)  Labs Reviewed  BASIC METABOLIC PANEL - Abnormal; Notable for the following components:      Result Value   Potassium 3.2 (*)    Glucose, Bld 102 (*)    All other components within normal limits  CBC  POC URINE PREG, ED  TROPONIN I (HIGH SENSITIVITY)  TROPONIN I (HIGH SENSITIVITY)   ____________________________________________  EKG   ____________________________________________  RADIOLOGY Unk Pinto, personally viewed and evaluated these images (plain radiographs) as part of my medical decision making, as well as reviewing the written report by the radiologist.  DG Chest 2 View  Result Date: 12/07/2020 CLINICAL DATA:  Chest pressure for 3 days EXAM: CHEST - 2 VIEW COMPARISON:  12/23/2014 FINDINGS: Midline trachea. Normal heart size and mediastinal contours. No pleural effusion or pneumothorax. Diffuse peribronchial thickening. IMPRESSION: 1. No acute cardiopulmonary disease. 2. Mild peribronchial thickening which may relate to chronic bronchitis or smoking. Electronically Signed   By: Abigail Miyamoto M.D.   On: 12/07/2020 17:49     ____________________________________________    PROCEDURES  Procedure(s) performed:     Procedures     Medications  predniSONE (DELTASONE) tablet 60 mg (60 mg Oral Given 12/07/20 2120)     ____________________________________________   INITIAL IMPRESSION / ASSESSMENT AND PLAN / ED COURSE  Pertinent labs & imaging results that were available during my care of the patient were reviewed by me and considered in my medical decision making (see chart for details).      Assessment and Plan:  Cough Chest pain 45 year old female presents to the emergency department with mid central chest pain for the past 3 days as well as cough that has persisted since patient developed a viral upper respiratory tract infection.  Vital signs were reassuring at triage.  On physical exam, patient was alert, active and nontoxic-appearing.  She had no wheezing.  EKG indicated sinus tachycardia but no ST segment elevation or other apparent arrhythmia  Both sets of troponin were within reference range.  Suspect COPD exacerbation as patient has had similar chest pain since developing with viral illness and has had persistent cough with purulent sputum production.  Will treat patient with azithromycin and prednisone.  Return precautions were given to return with new or worsening symptoms.  All patient questions were answered.     ____________________________________________  FINAL CLINICAL IMPRESSION(S) / ED DIAGNOSES  Final diagnoses:  Nonspecific chest pain  Cough      NEW MEDICATIONS STARTED DURING THIS VISIT:  ED Discharge Orders          Ordered    azithromycin (ZITHROMAX Z-PAK) 250 MG tablet  Status:  Discontinued        12/07/20 2113    predniSONE (STERAPRED UNI-PAK 21 TAB) 10 MG (21) TBPK tablet  Daily,   Status:  Discontinued  12/07/20 2113    azithromycin (ZITHROMAX Z-PAK) 250 MG tablet        12/07/20 2124    predniSONE (STERAPRED UNI-PAK 21 TAB) 10 MG (21) TBPK  tablet  Daily,   Status:  Discontinued        12/07/20 2124    predniSONE (STERAPRED UNI-PAK 21 TAB) 10 MG (21) TBPK tablet  Daily        12/07/20 2125                This chart was dictated using voice recognition software/Dragon. Despite best efforts to proofread, errors can occur which can change the meaning. Any change was purely unintentional.     Lannie Fields, PA-C 12/07/20 2148    Vanessa Westmont, MD 12/08/20 [phone]

## 2020-12-07 NOTE — Discharge Instructions (Addendum)
Take Azithromycin and prednisone as directed.

## 2021-01-07 DIAGNOSIS — J209 Acute bronchitis, unspecified: Secondary | ICD-10-CM | POA: Diagnosis not present

## 2021-01-07 DIAGNOSIS — Z03818 Encounter for observation for suspected exposure to other biological agents ruled out: Secondary | ICD-10-CM | POA: Diagnosis not present

## 2021-01-07 DIAGNOSIS — J449 Chronic obstructive pulmonary disease, unspecified: Secondary | ICD-10-CM | POA: Diagnosis not present

## 2021-01-07 DIAGNOSIS — J019 Acute sinusitis, unspecified: Secondary | ICD-10-CM | POA: Diagnosis not present

## 2021-02-02 ENCOUNTER — Other Ambulatory Visit: Payer: Self-pay | Admitting: Physician Assistant

## 2021-02-09 DIAGNOSIS — I700 Atherosclerosis of aorta: Secondary | ICD-10-CM | POA: Diagnosis not present

## 2021-02-09 DIAGNOSIS — B070 Plantar wart: Secondary | ICD-10-CM | POA: Diagnosis not present

## 2021-02-09 DIAGNOSIS — M5137 Other intervertebral disc degeneration, lumbosacral region: Secondary | ICD-10-CM | POA: Diagnosis not present

## 2021-02-09 DIAGNOSIS — M545 Low back pain, unspecified: Secondary | ICD-10-CM | POA: Diagnosis not present

## 2021-02-09 DIAGNOSIS — M47816 Spondylosis without myelopathy or radiculopathy, lumbar region: Secondary | ICD-10-CM | POA: Diagnosis not present

## 2021-02-09 DIAGNOSIS — M25571 Pain in right ankle and joints of right foot: Secondary | ICD-10-CM | POA: Diagnosis not present

## 2021-02-16 DIAGNOSIS — E876 Hypokalemia: Secondary | ICD-10-CM | POA: Diagnosis not present

## 2021-02-16 DIAGNOSIS — R112 Nausea with vomiting, unspecified: Secondary | ICD-10-CM | POA: Diagnosis not present

## 2021-02-16 DIAGNOSIS — Z03818 Encounter for observation for suspected exposure to other biological agents ruled out: Secondary | ICD-10-CM | POA: Diagnosis not present

## 2021-02-16 DIAGNOSIS — E538 Deficiency of other specified B group vitamins: Secondary | ICD-10-CM | POA: Diagnosis not present

## 2021-02-16 DIAGNOSIS — R531 Weakness: Secondary | ICD-10-CM | POA: Diagnosis not present

## 2021-02-16 DIAGNOSIS — F411 Generalized anxiety disorder: Secondary | ICD-10-CM | POA: Diagnosis not present

## 2021-02-16 DIAGNOSIS — R110 Nausea: Secondary | ICD-10-CM | POA: Diagnosis not present

## 2021-02-16 DIAGNOSIS — R519 Headache, unspecified: Secondary | ICD-10-CM | POA: Diagnosis not present

## 2021-02-16 DIAGNOSIS — E2749 Other adrenocortical insufficiency: Secondary | ICD-10-CM | POA: Diagnosis not present

## 2021-03-02 DIAGNOSIS — M25571 Pain in right ankle and joints of right foot: Secondary | ICD-10-CM | POA: Diagnosis not present

## 2021-03-02 DIAGNOSIS — D2371 Other benign neoplasm of skin of right lower limb, including hip: Secondary | ICD-10-CM | POA: Diagnosis not present

## 2021-03-02 DIAGNOSIS — Q6671 Congenital pes cavus, right foot: Secondary | ICD-10-CM | POA: Diagnosis not present

## 2021-03-02 DIAGNOSIS — M79671 Pain in right foot: Secondary | ICD-10-CM | POA: Diagnosis not present

## 2021-03-02 DIAGNOSIS — M25371 Other instability, right ankle: Secondary | ICD-10-CM | POA: Diagnosis not present

## 2021-03-02 DIAGNOSIS — M216X1 Other acquired deformities of right foot: Secondary | ICD-10-CM | POA: Diagnosis not present

## 2021-03-10 DIAGNOSIS — U071 COVID-19: Secondary | ICD-10-CM | POA: Diagnosis not present

## 2021-03-10 DIAGNOSIS — R110 Nausea: Secondary | ICD-10-CM | POA: Diagnosis not present

## 2021-03-10 DIAGNOSIS — E2749 Other adrenocortical insufficiency: Secondary | ICD-10-CM | POA: Diagnosis not present

## 2021-03-10 DIAGNOSIS — J208 Acute bronchitis due to other specified organisms: Secondary | ICD-10-CM | POA: Diagnosis not present

## 2021-03-10 DIAGNOSIS — Z20828 Contact with and (suspected) exposure to other viral communicable diseases: Secondary | ICD-10-CM | POA: Diagnosis not present

## 2021-04-20 DIAGNOSIS — Z8639 Personal history of other endocrine, nutritional and metabolic disease: Secondary | ICD-10-CM | POA: Diagnosis not present

## 2021-04-28 ENCOUNTER — Other Ambulatory Visit: Payer: Self-pay | Admitting: Internal Medicine

## 2021-04-28 DIAGNOSIS — J209 Acute bronchitis, unspecified: Secondary | ICD-10-CM | POA: Diagnosis not present

## 2021-04-28 DIAGNOSIS — E2749 Other adrenocortical insufficiency: Secondary | ICD-10-CM | POA: Diagnosis not present

## 2021-04-28 DIAGNOSIS — M797 Fibromyalgia: Secondary | ICD-10-CM | POA: Diagnosis not present

## 2021-04-28 DIAGNOSIS — H6503 Acute serous otitis media, bilateral: Secondary | ICD-10-CM | POA: Diagnosis not present

## 2021-04-28 DIAGNOSIS — Z1231 Encounter for screening mammogram for malignant neoplasm of breast: Secondary | ICD-10-CM

## 2021-05-03 DIAGNOSIS — R053 Chronic cough: Secondary | ICD-10-CM | POA: Diagnosis not present

## 2021-05-03 DIAGNOSIS — E2749 Other adrenocortical insufficiency: Secondary | ICD-10-CM | POA: Diagnosis not present

## 2021-05-03 DIAGNOSIS — M797 Fibromyalgia: Secondary | ICD-10-CM | POA: Diagnosis not present

## 2021-05-03 DIAGNOSIS — R0609 Other forms of dyspnea: Secondary | ICD-10-CM | POA: Diagnosis not present

## 2021-06-02 DIAGNOSIS — J4540 Moderate persistent asthma, uncomplicated: Secondary | ICD-10-CM | POA: Diagnosis not present

## 2021-06-02 DIAGNOSIS — J209 Acute bronchitis, unspecified: Secondary | ICD-10-CM | POA: Diagnosis not present

## 2021-06-07 ENCOUNTER — Other Ambulatory Visit: Payer: Self-pay

## 2021-06-07 MED ORDER — ALBUTEROL SULFATE HFA 108 (90 BASE) MCG/ACT IN AERS
INHALATION_SPRAY | RESPIRATORY_TRACT | 5 refills | Status: DC
  Filled 2021-06-07 – 2021-07-11 (×2): qty 54, 75d supply, fill #0
  Filled 2021-08-08: qty 36, 50d supply, fill #1
  Filled 2021-08-29: qty 18, 25d supply, fill #1
  Filled 2021-11-14: qty 18, 25d supply, fill #2

## 2021-06-08 ENCOUNTER — Other Ambulatory Visit: Payer: Self-pay

## 2021-06-16 DIAGNOSIS — R42 Dizziness and giddiness: Secondary | ICD-10-CM | POA: Diagnosis not present

## 2021-06-16 DIAGNOSIS — J40 Bronchitis, not specified as acute or chronic: Secondary | ICD-10-CM | POA: Diagnosis not present

## 2021-06-16 DIAGNOSIS — J45909 Unspecified asthma, uncomplicated: Secondary | ICD-10-CM | POA: Diagnosis not present

## 2021-06-16 DIAGNOSIS — J4540 Moderate persistent asthma, uncomplicated: Secondary | ICD-10-CM | POA: Diagnosis not present

## 2021-06-16 DIAGNOSIS — H9203 Otalgia, bilateral: Secondary | ICD-10-CM | POA: Diagnosis not present

## 2021-06-21 ENCOUNTER — Other Ambulatory Visit: Payer: Self-pay

## 2021-06-21 DIAGNOSIS — M19012 Primary osteoarthritis, left shoulder: Secondary | ICD-10-CM | POA: Diagnosis not present

## 2021-06-21 DIAGNOSIS — M25511 Pain in right shoulder: Secondary | ICD-10-CM | POA: Diagnosis not present

## 2021-06-21 DIAGNOSIS — M7580 Other shoulder lesions, unspecified shoulder: Secondary | ICD-10-CM | POA: Diagnosis not present

## 2021-06-21 DIAGNOSIS — M25512 Pain in left shoulder: Secondary | ICD-10-CM | POA: Diagnosis not present

## 2021-06-21 DIAGNOSIS — M19011 Primary osteoarthritis, right shoulder: Secondary | ICD-10-CM | POA: Diagnosis not present

## 2021-06-21 DIAGNOSIS — M7581 Other shoulder lesions, right shoulder: Secondary | ICD-10-CM | POA: Diagnosis not present

## 2021-06-30 DIAGNOSIS — Z3200 Encounter for pregnancy test, result unknown: Secondary | ICD-10-CM | POA: Diagnosis not present

## 2021-06-30 DIAGNOSIS — Z0000 Encounter for general adult medical examination without abnormal findings: Secondary | ICD-10-CM | POA: Diagnosis not present

## 2021-06-30 DIAGNOSIS — Z309 Encounter for contraceptive management, unspecified: Secondary | ICD-10-CM | POA: Diagnosis not present

## 2021-07-04 ENCOUNTER — Encounter
Admission: EM | Disposition: A | Discharge status: Home / Self Care | Payer: Self-pay | Source: Home / Self Care | Attending: Internal Medicine

## 2021-07-04 ENCOUNTER — Other Ambulatory Visit: Payer: Self-pay

## 2021-07-04 ENCOUNTER — Encounter: Payer: Self-pay | Admitting: Cardiovascular Disease

## 2021-07-04 ENCOUNTER — Inpatient Hospital Stay: Payer: 59

## 2021-07-04 ENCOUNTER — Emergency Department: Payer: 59

## 2021-07-04 ENCOUNTER — Other Ambulatory Visit (HOSPITAL_COMMUNITY): Payer: Self-pay

## 2021-07-04 ENCOUNTER — Inpatient Hospital Stay
Admission: EM | Admit: 2021-07-04 | Discharge: 2021-07-05 | DRG: 247 | Disposition: A | Discharge status: Home / Self Care | Payer: 59 | Attending: Internal Medicine | Admitting: Internal Medicine

## 2021-07-04 ENCOUNTER — Inpatient Hospital Stay
Admit: 2021-07-04 | Discharge: 2021-07-04 | Disposition: A | Discharge status: Home / Self Care | Payer: 59 | Attending: Cardiovascular Disease | Admitting: Cardiovascular Disease

## 2021-07-04 DIAGNOSIS — E785 Hyperlipidemia, unspecified: Secondary | ICD-10-CM

## 2021-07-04 DIAGNOSIS — F17210 Nicotine dependence, cigarettes, uncomplicated: Secondary | ICD-10-CM | POA: Diagnosis not present

## 2021-07-04 DIAGNOSIS — R0902 Hypoxemia: Secondary | ICD-10-CM | POA: Diagnosis not present

## 2021-07-04 DIAGNOSIS — F418 Other specified anxiety disorders: Secondary | ICD-10-CM

## 2021-07-04 DIAGNOSIS — I2510 Atherosclerotic heart disease of native coronary artery without angina pectoris: Secondary | ICD-10-CM | POA: Diagnosis not present

## 2021-07-04 DIAGNOSIS — D72828 Other elevated white blood cell count: Secondary | ICD-10-CM | POA: Diagnosis not present

## 2021-07-04 DIAGNOSIS — R9431 Abnormal electrocardiogram [ECG] [EKG]: Secondary | ICD-10-CM | POA: Diagnosis not present

## 2021-07-04 DIAGNOSIS — Z8249 Family history of ischemic heart disease and other diseases of the circulatory system: Secondary | ICD-10-CM

## 2021-07-04 DIAGNOSIS — E876 Hypokalemia: Secondary | ICD-10-CM | POA: Diagnosis not present

## 2021-07-04 DIAGNOSIS — Z803 Family history of malignant neoplasm of breast: Secondary | ICD-10-CM

## 2021-07-04 DIAGNOSIS — R197 Diarrhea, unspecified: Secondary | ICD-10-CM

## 2021-07-04 DIAGNOSIS — I219 Acute myocardial infarction, unspecified: Secondary | ICD-10-CM | POA: Diagnosis not present

## 2021-07-04 DIAGNOSIS — I9589 Other hypotension: Secondary | ICD-10-CM | POA: Diagnosis present

## 2021-07-04 DIAGNOSIS — I471 Supraventricular tachycardia, unspecified: Secondary | ICD-10-CM | POA: Diagnosis present

## 2021-07-04 DIAGNOSIS — I440 Atrioventricular block, first degree: Secondary | ICD-10-CM | POA: Diagnosis present

## 2021-07-04 DIAGNOSIS — R079 Chest pain, unspecified: Secondary | ICD-10-CM | POA: Diagnosis not present

## 2021-07-04 DIAGNOSIS — Z20822 Contact with and (suspected) exposure to covid-19: Secondary | ICD-10-CM | POA: Diagnosis present

## 2021-07-04 DIAGNOSIS — Z720 Tobacco use: Secondary | ICD-10-CM

## 2021-07-04 DIAGNOSIS — Z91041 Radiographic dye allergy status: Secondary | ICD-10-CM | POA: Diagnosis not present

## 2021-07-04 DIAGNOSIS — K219 Gastro-esophageal reflux disease without esophagitis: Secondary | ICD-10-CM | POA: Diagnosis present

## 2021-07-04 DIAGNOSIS — D72829 Elevated white blood cell count, unspecified: Secondary | ICD-10-CM | POA: Diagnosis not present

## 2021-07-04 DIAGNOSIS — M797 Fibromyalgia: Secondary | ICD-10-CM | POA: Diagnosis present

## 2021-07-04 DIAGNOSIS — I213 ST elevation (STEMI) myocardial infarction of unspecified site: Secondary | ICD-10-CM

## 2021-07-04 DIAGNOSIS — I2119 ST elevation (STEMI) myocardial infarction involving other coronary artery of inferior wall: Principal | ICD-10-CM

## 2021-07-04 DIAGNOSIS — R0789 Other chest pain: Secondary | ICD-10-CM | POA: Diagnosis not present

## 2021-07-04 DIAGNOSIS — Z79899 Other long term (current) drug therapy: Secondary | ICD-10-CM

## 2021-07-04 HISTORY — PX: LEFT HEART CATH AND CORONARY ANGIOGRAPHY: CATH118249

## 2021-07-04 HISTORY — PX: CORONARY/GRAFT ACUTE MI REVASCULARIZATION: CATH118305

## 2021-07-04 LAB — CBC WITH DIFFERENTIAL/PLATELET
Abs Immature Granulocytes: 0.06 10*3/uL (ref 0.00–0.07)
Basophils Absolute: 0.1 10*3/uL (ref 0.0–0.1)
Basophils Relative: 1 %
Eosinophils Absolute: 0.1 10*3/uL (ref 0.0–0.5)
Eosinophils Relative: 1 %
HCT: 41.5 % (ref 36.0–46.0)
Hemoglobin: 14.1 g/dL (ref 12.0–15.0)
Immature Granulocytes: 1 %
Lymphocytes Relative: 40 %
Lymphs Abs: 5.1 10*3/uL — ABNORMAL HIGH (ref 0.7–4.0)
MCH: 30.9 pg (ref 26.0–34.0)
MCHC: 34 g/dL (ref 30.0–36.0)
MCV: 91 fL (ref 80.0–100.0)
Monocytes Absolute: 0.7 10*3/uL (ref 0.1–1.0)
Monocytes Relative: 6 %
Neutro Abs: 6.7 10*3/uL (ref 1.7–7.7)
Neutrophils Relative %: 51 %
Platelets: 395 10*3/uL (ref 150–400)
RBC: 4.56 MIL/uL (ref 3.87–5.11)
RDW: 13.2 % (ref 11.5–15.5)
WBC: 12.8 10*3/uL — ABNORMAL HIGH (ref 4.0–10.5)
nRBC: 0 % (ref 0.0–0.2)

## 2021-07-04 LAB — TROPONIN I (HIGH SENSITIVITY)
Troponin I (High Sensitivity): 58 ng/L — ABNORMAL HIGH (ref <18)
Troponin I (High Sensitivity): 2514 ng/L (ref <18)

## 2021-07-04 LAB — POCT ACTIVATED CLOTTING TIME: Activated Clotting Time: 432 seconds

## 2021-07-04 LAB — ECHOCARDIOGRAM COMPLETE
AR max vel: 2.41 cm2
AV Area VTI: 2.52 cm2
AV Area mean vel: 2.49 cm2
AV Mean grad: 3 mmHg
AV Peak grad: 5.9 mmHg
Ao pk vel: 1.21 m/s
Area-P 1/2: 6.02 cm2
Calc EF: 56.1 %
Height: 61 in
MV VTI: 2.75 cm2
S' Lateral: 3.8 cm
Single Plane A2C EF: 58.4 %
Single Plane A4C EF: 51.8 %
Weight: 2828.94 oz

## 2021-07-04 LAB — LIPID PANEL
Cholesterol: 276 mg/dL — ABNORMAL HIGH (ref 0–200)
HDL: 33 mg/dL — ABNORMAL LOW (ref >40)
Total CHOL/HDL Ratio: 8.4 RATIO
Triglycerides: 183 mg/dL — ABNORMAL HIGH (ref <150)
VLDL: 37 mg/dL (ref 0–40)

## 2021-07-04 LAB — COMPREHENSIVE METABOLIC PANEL
ALT: 14 U/L (ref 0–44)
AST: 18 U/L (ref 15–41)
Albumin: 3.7 g/dL (ref 3.5–5.0)
Alkaline Phosphatase: 84 U/L (ref 38–126)
Anion gap: 11 (ref 5–15)
BUN: 8 mg/dL (ref 6–20)
CO2: 21 mmol/L — ABNORMAL LOW (ref 22–32)
Calcium: 8.6 mg/dL — ABNORMAL LOW (ref 8.9–10.3)
Chloride: 103 mmol/L (ref 98–111)
Creatinine, Ser: 0.82 mg/dL (ref 0.44–1.00)
GFR, Estimated: >60 mL/min (ref >60)
Glucose, Bld: 163 mg/dL — ABNORMAL HIGH (ref 70–99)
Potassium: 2.4 mmol/L — CL (ref 3.5–5.1)
Sodium: 135 mmol/L (ref 135–145)
Total Bilirubin: 0.3 mg/dL (ref 0.3–1.2)
Total Protein: 6.7 g/dL (ref 6.5–8.1)

## 2021-07-04 LAB — HEMOGLOBIN A1C
Hgb A1c MFr Bld: 5.3 % (ref 4.8–5.6)
Mean Plasma Glucose: 105 mg/dL

## 2021-07-04 LAB — PROTIME-INR
INR: 1 (ref 0.8–1.2)
Prothrombin Time: 12.9 seconds (ref 11.4–15.2)

## 2021-07-04 LAB — RESP PANEL BY RT-PCR (FLU A&B, COVID) ARPGX2
Influenza A by PCR: NEGATIVE
Influenza B by PCR: NEGATIVE
SARS Coronavirus 2 by RT PCR: NEGATIVE

## 2021-07-04 LAB — MAGNESIUM: Magnesium: 2 mg/dL (ref 1.7–2.4)

## 2021-07-04 LAB — APTT: aPTT: 28 seconds (ref 24–36)

## 2021-07-04 LAB — GLUCOSE, CAPILLARY: Glucose-Capillary: 118 mg/dL — ABNORMAL HIGH (ref 70–99)

## 2021-07-04 LAB — POTASSIUM: Potassium: 2.7 mmol/L — CL (ref 3.5–5.1)

## 2021-07-04 SURGERY — CORONARY/GRAFT ACUTE MI REVASCULARIZATION
Anesthesia: Moderate Sedation

## 2021-07-04 MED ORDER — FAMOTIDINE IN NACL 20-0.9 MG/50ML-% IV SOLN
20.0000 mg | Freq: Two times a day (BID) | INTRAVENOUS | Status: DC
  Administered 2021-07-04: 20 mg via INTRAVENOUS
  Filled 2021-07-04: qty 50

## 2021-07-04 MED ORDER — TIROFIBAN HCL IN NACL 5-0.9 MG/100ML-% IV SOLN
0.1500 ug/kg/min | INTRAVENOUS | Status: AC
  Administered 2021-07-04 (×2): 0.15 ug/kg/min via INTRAVENOUS
  Filled 2021-07-04 (×2): qty 100

## 2021-07-04 MED ORDER — ONDANSETRON HCL 4 MG/2ML IJ SOLN
INTRAMUSCULAR | Status: AC
  Filled 2021-07-04: qty 2

## 2021-07-04 MED ORDER — VERAPAMIL HCL 2.5 MG/ML IV SOLN
INTRAVENOUS | Status: AC
  Filled 2021-07-04: qty 2

## 2021-07-04 MED ORDER — ENOXAPARIN SODIUM 40 MG/0.4ML IJ SOSY
40.0000 mg | PREFILLED_SYRINGE | INTRAMUSCULAR | Status: DC
  Administered 2021-07-05: 40 mg via SUBCUTANEOUS
  Filled 2021-07-04: qty 0.4

## 2021-07-04 MED ORDER — SODIUM CHLORIDE 0.9% FLUSH: 3.0000 mL | INTRAVENOUS | Status: DC | PRN

## 2021-07-04 MED ORDER — METHYLPREDNISOLONE SODIUM SUCC 125 MG IJ SOLR
125.0000 mg | Freq: Once | INTRAMUSCULAR | Status: AC
  Administered 2021-07-04: 125 mg via INTRAVENOUS

## 2021-07-04 MED ORDER — ALPRAZOLAM 0.5 MG PO TABS
0.5000 mg | ORAL_TABLET | Freq: Three times a day (TID) | ORAL | Status: DC | PRN
  Administered 2021-07-04 – 2021-07-05 (×2): 0.5 mg via ORAL
  Filled 2021-07-04 (×2): qty 1

## 2021-07-04 MED ORDER — POTASSIUM CHLORIDE CRYS ER 20 MEQ PO TBCR: 40.0000 meq | EXTENDED_RELEASE_TABLET | Freq: Once | ORAL | Status: AC

## 2021-07-04 MED ORDER — ALBUTEROL SULFATE (2.5 MG/3ML) 0.083% IN NEBU: 3.0000 mL | INHALATION_SOLUTION | Freq: Four times a day (QID) | RESPIRATORY_TRACT | Status: DC | PRN

## 2021-07-04 MED ORDER — DOCUSATE SODIUM 100 MG PO CAPS: 100.0000 mg | ORAL_CAPSULE | Freq: Every day | ORAL | Status: DC | PRN

## 2021-07-04 MED ORDER — MAGNESIUM SULFATE IN D5W 1-5 GM/100ML-% IV SOLN
1.0000 g | Freq: Once | INTRAVENOUS | Status: AC
  Administered 2021-07-04: 1 g via INTRAVENOUS
  Filled 2021-07-04: qty 100

## 2021-07-04 MED ORDER — HEPARIN (PORCINE) IN NACL 2000-0.9 UNIT/L-% IV SOLN
INTRAVENOUS | Status: DC | PRN
  Administered 2021-07-04: 1000 mL

## 2021-07-04 MED ORDER — POTASSIUM CHLORIDE CRYS ER 10 MEQ PO TBCR
EXTENDED_RELEASE_TABLET | ORAL | Status: DC | PRN
  Administered 2021-07-04: 40 meq via ORAL

## 2021-07-04 MED ORDER — ASPIRIN 81 MG PO CHEW
81.0000 mg | CHEWABLE_TABLET | Freq: Every day | ORAL | Status: DC
  Administered 2021-07-05: 81 mg via ORAL
  Filled 2021-07-04: qty 1

## 2021-07-04 MED ORDER — ATROPINE SULFATE 1 MG/10ML IJ SOSY
PREFILLED_SYRINGE | INTRAMUSCULAR | Status: AC
Start: 2021-07-04 — End: ?
  Filled 2021-07-04: qty 10

## 2021-07-04 MED ORDER — ONDANSETRON HCL 4 MG/2ML IJ SOLN: 4.0000 mg | Freq: Four times a day (QID) | INTRAMUSCULAR | Status: DC | PRN

## 2021-07-04 MED ORDER — ATORVASTATIN CALCIUM 20 MG PO TABS
80.0000 mg | ORAL_TABLET | Freq: Every day | ORAL | Status: DC
  Administered 2021-07-04 – 2021-07-05 (×2): 80 mg via ORAL
  Filled 2021-07-04 (×2): qty 4

## 2021-07-04 MED ORDER — MIDAZOLAM HCL 2 MG/2ML IJ SOLN: 2.0000 mg | Freq: Once | INTRAMUSCULAR | Status: DC

## 2021-07-04 MED ORDER — ALUM & MAG HYDROXIDE-SIMETH 200-200-20 MG/5ML PO SUSP
30.0000 mL | Freq: Four times a day (QID) | ORAL | Status: DC | PRN
  Administered 2021-07-04: 30 mL via ORAL
  Filled 2021-07-04: qty 30

## 2021-07-04 MED ORDER — SODIUM CHLORIDE 0.9% FLUSH
3.0000 mL | Freq: Two times a day (BID) | INTRAVENOUS | Status: DC
  Administered 2021-07-04: 3 mL via INTRAVENOUS

## 2021-07-04 MED ORDER — TIROFIBAN HCL IN NACL 5-0.9 MG/100ML-% IV SOLN
INTRAVENOUS | Status: AC | PRN
Start: 2021-07-04 — End: 2021-07-04
  Administered 2021-07-04: .15 ug/kg/min via INTRAVENOUS

## 2021-07-04 MED ORDER — SODIUM CHLORIDE 0.9 % IV SOLN
INTRAVENOUS | Status: AC | PRN
  Administered 2021-07-04: 250 mL via INTRAVENOUS

## 2021-07-04 MED ORDER — TIROFIBAN (AGGRASTAT) BOLUS VIA INFUSION
INTRAVENOUS | Status: DC | PRN
  Administered 2021-07-04: 1895 ug via INTRAVENOUS

## 2021-07-04 MED ORDER — TIROFIBAN HCL IV 12.5 MG/250 ML
INTRAVENOUS | Status: AC
  Filled 2021-07-04: qty 250

## 2021-07-04 MED ORDER — HEPARIN SODIUM (PORCINE) 1000 UNIT/ML IJ SOLN
INTRAMUSCULAR | Status: AC
  Filled 2021-07-04: qty 10

## 2021-07-04 MED ORDER — MORPHINE SULFATE (PF) 2 MG/ML IV SOLN: 2.0000 mg | INTRAVENOUS | Status: DC | PRN

## 2021-07-04 MED ORDER — ASPIRIN 81 MG PO CHEW: 324.0000 mg | CHEWABLE_TABLET | Freq: Once | ORAL | Status: DC

## 2021-07-04 MED ORDER — POTASSIUM CHLORIDE CRYS ER 20 MEQ PO TBCR
40.0000 meq | EXTENDED_RELEASE_TABLET | ORAL | Status: AC
  Administered 2021-07-04 (×2): 40 meq via ORAL
  Filled 2021-07-04 (×2): qty 2

## 2021-07-04 MED ORDER — TICAGRELOR 90 MG PO TABS
90.0000 mg | ORAL_TABLET | Freq: Two times a day (BID) | ORAL | Status: DC
  Administered 2021-07-04 – 2021-07-05 (×2): 90 mg via ORAL
  Filled 2021-07-04 (×2): qty 1

## 2021-07-04 MED ORDER — NICOTINE 21 MG/24HR TD PT24
21.0000 mg | MEDICATED_PATCH | Freq: Every day | TRANSDERMAL | Status: DC
Start: 2021-07-04 — End: 2021-07-05
  Filled 2021-07-04: qty 1

## 2021-07-04 MED ORDER — HEPARIN SODIUM (PORCINE) 1000 UNIT/ML IJ SOLN
INTRAMUSCULAR | Status: DC | PRN
  Administered 2021-07-04: 4000 [IU] via INTRAVENOUS

## 2021-07-04 MED ORDER — METOPROLOL SUCCINATE ER 50 MG PO TB24
25.0000 mg | ORAL_TABLET | Freq: Two times a day (BID) | ORAL | Status: DC
  Administered 2021-07-04: 25 mg via ORAL
  Filled 2021-07-04: qty 1

## 2021-07-04 MED ORDER — SODIUM CHLORIDE 0.9 % WEIGHT BASED INFUSION
1.0000 mL/kg/h | INTRAVENOUS | Status: AC
  Administered 2021-07-04: 1 mL/kg/h via INTRAVENOUS

## 2021-07-04 MED ORDER — SODIUM CHLORIDE 0.9 % IV BOLUS
250.0000 mL | Freq: Once | INTRAVENOUS | Status: AC
  Administered 2021-07-04: 250 mL via INTRAVENOUS

## 2021-07-04 MED ORDER — ONDANSETRON HCL 4 MG/2ML IJ SOLN
INTRAMUSCULAR | Status: DC | PRN
  Administered 2021-07-04: 4 mg via INTRAVENOUS

## 2021-07-04 MED ORDER — LORAZEPAM 2 MG/ML IJ SOLN: 1.0000 mg | Freq: Once | INTRAMUSCULAR | Status: AC

## 2021-07-04 MED ORDER — HEPARIN SODIUM (PORCINE) 5000 UNIT/ML IJ SOLN
4000.0000 [IU] | Freq: Once | INTRAMUSCULAR | Status: AC
  Administered 2021-07-04: 4000 [IU] via INTRAVENOUS

## 2021-07-04 MED ORDER — LIDOCAINE HCL (PF) 1 % IJ SOLN
INTRAMUSCULAR | Status: DC | PRN
  Administered 2021-07-04: 2 mL

## 2021-07-04 MED ORDER — CHLORHEXIDINE GLUCONATE CLOTH 2 % EX PADS
6.0000 | MEDICATED_PAD | Freq: Every day | CUTANEOUS | Status: DC
  Administered 2021-07-04: 6 via TOPICAL

## 2021-07-04 MED ORDER — TICAGRELOR 90 MG PO TABS
ORAL_TABLET | ORAL | Status: DC | PRN
  Administered 2021-07-04: 180 mg via ORAL

## 2021-07-04 MED ORDER — ALPRAZOLAM 0.5 MG PO TABS: 0.5000 mg | ORAL_TABLET | Freq: Every evening | ORAL | Status: DC | PRN

## 2021-07-04 MED ORDER — POTASSIUM CHLORIDE CRYS ER 20 MEQ PO TBCR
EXTENDED_RELEASE_TABLET | ORAL | Status: AC
  Administered 2021-07-04: 40 meq via ORAL
  Filled 2021-07-04: qty 2

## 2021-07-04 MED ORDER — DIPHENHYDRAMINE HCL 50 MG/ML IJ SOLN
INTRAMUSCULAR | Status: DC | PRN
  Administered 2021-07-04: 50 mg via INTRAVENOUS

## 2021-07-04 MED ORDER — LIDOCAINE HCL 1 % IJ SOLN
INTRAMUSCULAR | Status: AC
  Filled 2021-07-04: qty 20

## 2021-07-04 MED ORDER — VERAPAMIL HCL 2.5 MG/ML IV SOLN
INTRAVENOUS | Status: DC | PRN
  Administered 2021-07-04: 2.5 mg via INTRA_ARTERIAL

## 2021-07-04 MED ORDER — MIDAZOLAM HCL 2 MG/2ML IJ SOLN
INTRAMUSCULAR | Status: AC
  Filled 2021-07-04: qty 2

## 2021-07-04 MED ORDER — ATROPINE SULFATE 1 MG/10ML IJ SOSY
PREFILLED_SYRINGE | INTRAMUSCULAR | Status: DC | PRN
  Administered 2021-07-04: 1 mg via INTRAVENOUS

## 2021-07-04 MED ORDER — FENTANYL CITRATE (PF) 100 MCG/2ML IJ SOLN
INTRAMUSCULAR | Status: AC
  Filled 2021-07-04: qty 2

## 2021-07-04 MED ORDER — SODIUM CHLORIDE 0.9 % IV SOLN: INTRAVENOUS | Status: DC

## 2021-07-04 MED ORDER — METOPROLOL SUCCINATE ER 50 MG PO TB24: 50.0000 mg | ORAL_TABLET | Freq: Two times a day (BID) | ORAL | Status: DC

## 2021-07-04 MED ORDER — TIROFIBAN HCL IN NACL 5-0.9 MG/100ML-% IV SOLN
INTRAVENOUS | Status: AC
  Filled 2021-07-04: qty 100

## 2021-07-04 MED ORDER — HEPARIN (PORCINE) IN NACL 1000-0.9 UT/500ML-% IV SOLN
INTRAVENOUS | Status: AC
  Filled 2021-07-04: qty 1000

## 2021-07-04 MED ORDER — NITROGLYCERIN 0.4 MG SL SUBL
0.4000 mg | SUBLINGUAL_TABLET | SUBLINGUAL | Status: DC | PRN
  Administered 2021-07-04: 0.4 mg via SUBLINGUAL
  Filled 2021-07-04: qty 1

## 2021-07-04 MED ORDER — PANTOPRAZOLE SODIUM 40 MG PO TBEC
40.0000 mg | DELAYED_RELEASE_TABLET | Freq: Every day | ORAL | Status: DC
  Administered 2021-07-04: 40 mg via ORAL
  Filled 2021-07-04 (×2): qty 1

## 2021-07-04 MED ORDER — DULOXETINE HCL 20 MG PO CPEP
20.0000 mg | ORAL_CAPSULE | Freq: Every day | ORAL | Status: DC
  Administered 2021-07-04: 20 mg via ORAL
  Filled 2021-07-04: qty 1

## 2021-07-04 MED ORDER — DIPHENHYDRAMINE HCL 50 MG/ML IJ SOLN
INTRAMUSCULAR | Status: AC
  Filled 2021-07-04: qty 1

## 2021-07-04 MED ORDER — SODIUM CHLORIDE 0.9 % IV SOLN: 250.0000 mL | INTRAVENOUS | Status: DC | PRN

## 2021-07-04 MED ORDER — IOHEXOL 300 MG/ML  SOLN
INTRAMUSCULAR | Status: DC | PRN
  Administered 2021-07-04: 80 mL

## 2021-07-04 MED ORDER — ACETAMINOPHEN 325 MG PO TABS: 650.0000 mg | ORAL_TABLET | ORAL | Status: DC | PRN

## 2021-07-04 MED ORDER — LORAZEPAM 2 MG/ML IJ SOLN
INTRAMUSCULAR | Status: AC
  Administered 2021-07-04: 1 mg via INTRAVENOUS
  Filled 2021-07-04: qty 1

## 2021-07-04 MED ORDER — TICAGRELOR 90 MG PO TABS
ORAL_TABLET | ORAL | Status: AC
  Filled 2021-07-04: qty 2

## 2021-07-04 MED ORDER — POTASSIUM CHLORIDE 10 MEQ/100ML IV SOLN
10.0000 meq | INTRAVENOUS | Status: AC
  Administered 2021-07-04 (×2): 10 meq via INTRAVENOUS
  Filled 2021-07-04 (×2): qty 100

## 2021-07-04 SURGICAL SUPPLY — 17 items
BALLN TREK RX 2.5X15 (BALLOONS) ×2
BALLOON TREK RX 2.5X15 (BALLOONS) IMPLANT
CATH 5F 110X4 TIG (CATHETERS) ×1 IMPLANT
CATH VISTA GUIDE 6FR JR4 (CATHETERS) ×1 IMPLANT
DEVICE RAD TR BAND REGULAR (VASCULAR PRODUCTS) ×1 IMPLANT
DRAPE BRACHIAL (DRAPES) ×1 IMPLANT
GLIDESHEATH SLEND SS 6F .021 (SHEATH) ×1 IMPLANT
GUIDEWIRE INQWIRE 1.5J.035X260 (WIRE) IMPLANT
INQWIRE 1.5J .035X260CM (WIRE) ×2
KIT SYRINGE INJ CVI SPIKEX1 (MISCELLANEOUS) ×1 IMPLANT
PACK CARDIAC CATH (CUSTOM PROCEDURE TRAY) ×2 IMPLANT
PROTECTION STATION PRESSURIZED (MISCELLANEOUS) ×2
SET ATX SIMPLICITY (MISCELLANEOUS) ×1 IMPLANT
STATION PROTECTION PRESSURIZED (MISCELLANEOUS) IMPLANT
STENT ONYX FRONTIER 4.0X22 (Permanent Stent) ×1 IMPLANT
TUBING CIL FLEX 10 FLL-RA (TUBING) ×1 IMPLANT
WIRE RUNTHROUGH .014X180CM (WIRE) ×1 IMPLANT

## 2021-07-04 NOTE — ED Provider Notes (Signed)
[facility] Provider Note    Event Date/Time   First MD Initiated Contact with Patient 07/04/21 [phone]     (approximate)  History   Chief Complaint: Code STEMI  HPI  Lisa Davila is a 46 y.o. female with a past medical history of anxiety, tachycardia on metoprolol who presents to the emergency department for chest pain.  According to the patient since this morning she has been feeling chest pressure as well as shortness of breath.  Patient denies any past history of cardiac disease.  Denies any drug or alcohol use.  EMS noted elevation in leads II and III and a STEMI was called from the field.  I met the patient in the room upon arrival cardiology met the patient in the room as well.  Physical Exam   Triage Vital Signs: ED Triage Vitals [07/04/21 0915]  Enc Vitals Group     BP (!) 87/59     Pulse Rate 99     Resp (!) 23     Temp      Temp src      SpO2 97 %     Weight      Height      Head Circumference      Peak Flow      Pain Score      Pain Loc      Pain Edu?      Excl. in [place]?     Most recent vital signs: Vitals:   07/04/21 0915  BP: (!) 87/59  Pulse: 99  Resp: (!) 23  SpO2: 97%    General: Awake, no distress.  CV:  Good peripheral perfusion.  Regular rate and rhythm  Resp:  Normal effort.  Equal breath sounds bilaterally.  Abd:  No distention.  Soft, nontender.  No rebound or guarding. Other:  Patient is very anxious appearing, asking for something for anxiety.   ED Results / Procedures / Treatments   EKG  EKG viewed and interpreted by myself shows a normal sinus rhythm at 98 bpm with a narrow QRS, normal axis, largely normal intervals.  Patient does have elevation in leads II, III and aVF patient has possible reciprocal slight depressions in aVL.  Concerning for possible inferior STEMI.  RADIOLOGY  Chest x-ray not performed prior to catheterization lab   MEDICATIONS ORDERED IN ED: Medications  LORazepam (ATIVAN)  injection 1 mg (has no administration in time range)  0.9 %  sodium chloride infusion (has no administration in time range)  aspirin chewable tablet 324 mg (has no administration in time range)  heparin injection 60 Units/kg (has no administration in time range)  sodium chloride 0.9 % bolus 250 mL (has no administration in time range)  methylPREDNISolone sodium succinate (SOLU-MEDROL) 125 mg/2 mL injection 125 mg (has no administration in time range)     IMPRESSION / MDM / ASSESSMENT AND PLAN / ED COURSE  I reviewed the triage vital signs and the nursing notes.  Patient presents to the emergency department for onset of chest pain/pressure this morning as well as shortness of breath.  Patient received 4 aspirin by EMS prior to arrival.  Patient immediately moved over to the stretcher had an EKG performed showing concern for inferior STEMI.  Patient states she is feeling extremely anxious, does not believe she could go through with the catheterization.  However after further discussion with the patient she is agreeable we will dose Ativan for anxiety.  Patient proceeded directly to the  cardiac catheterization lab.  Ativan dose given in the emergency department.  Shortly after evaluation patient moved directly to the cardiac catheterization lab with cardiology prior to heparin or chest x-ray.  Labs pending.  Blood pressure is low however patient received nitroglycerin and aspirin by EMS prior to arrival.  EMS states blood pressure levels are normal to hypertensive prior to nitroglycerin administration.  CRITICAL CARE Performed by: Harvest Dark   Total critical care time: 20 minutes  Critical care time was exclusive of separately billable procedures and treating other patients.  Critical care was necessary to treat or prevent imminent or life-threatening deterioration.  Critical care was time spent personally by me on the following activities: development of treatment plan with patient  and/or surrogate as well as nursing, discussions with consultants, evaluation of patient's response to treatment, examination of patient, obtaining history from patient or surrogate, ordering and performing treatments and interventions, ordering and review of laboratory studies, ordering and review of radiographic studies, pulse oximetry and re-evaluation of patient's condition.  FINAL CLINICAL IMPRESSION(S) / ED DIAGNOSES   Inferior STEMI    Note:  This document was prepared using Dragon voice recognition software and may include unintentional dictation errors.   Harvest Dark, MD 07/04/21 [phone]

## 2021-07-04 NOTE — H&P (Signed)
History and Physical    Lisa Davila RKY:[id number] DOB: [birth year] DOA: 07/04/2021  Referring MD/NP/PA:   PCP: Tracie Harrier, MD   Patient coming from:  The patient is coming from home.  At baseline, pt is independent for most of ADL.        Chief Complaint: chest pain  HPI: Lisa Davila is a 46 y.o. female with medical history significant of SVT, GERD, depression with anxiety, tobacco use, pernicious anemia, vertigo, dysphagia, who presents with chest pain.  Patient states that her chest pain started at about 8:40 AM, which is located in substernal area, pressure-like, 10 out of 10 in severity, radiating to the left back of neck, associated with mild shortness of breath.  She has chronic dry cough which she attributed to smoking.  No fever or chills.  Patient states that she has had 4-5 times of diarrhea today, had nausea which has resolved, no vomiting or abdominal pain.  No symptoms of UTI.  Pt was found to have ST elevation in inferior leads, cardiology is consulted, Dr. Sophronia Simas did urgent cardiac cath with stent placement.  After the procedure, her chest pain is resolved.  Of note, patient has history of contrast allergy, she was premedicated with Benadryl and Solu-Medrol. EMS run sheet not yet uploaded. She was taken emergently to the cath lab.  Cardiac cath by Dr. Fletcher Anon:   Prox RCA lesion is 99% stenosed.   Mid LAD lesion is 30% stenosed.   A drug-eluting stent was successfully placed using a STENT ONYX FRONTIER 4.0X22.   Post intervention, there is a 0% residual stenosis.   There is mild left ventricular systolic dysfunction.   LV end diastolic pressure is mildly elevated.   The left ventricular ejection fraction is 45-50% by visual estimate.   1.  Severe one-vessel coronary artery disease with thrombotic subtotal occlusion of the proximal right coronary artery which is the culprit for inferior ST elevation myocardial infarction.  Mild disease affecting the left  coronary system. 2.  Mildly reduced LV systolic function and mildly elevated left ventricular end-diastolic pressure at 17 mmHg. 3.  Successful angioplasty and drug-eluting stent placement to the right coronary artery.   Recommendations: Given large thrombus burden, continue Aggrastat infusion for 12 hours. Dual antiplatelet therapy is recommended for at least 12 months. Aggressive treatment of risk factors and smoking cessation.  Data Reviewed and ED Course: pt was found to have pending troponin level, WBC 12.8, INR 1.0, PTT 28, negative COVID PCR, potassium 2.4, pending magnesium, temperature normal, soft blood pressure 87/59, which improved to 106/70, heart rate of 100, RR 23, oxygen saturation 99% on 2 L oxygen.  Pending chest x-ray.  Patient is admitted to ICU as inpatient.   EKG: I have personally reviewed.  Sinus rhythm, QTc 464, ST elevation in inferior leads, ST depression in V1-V2 and lead I/aVL, early R wave progression  Review of Systems:   General: no fevers, chills, no body weight gain, fatigue HEENT: no blurry vision, hearing changes or sore throat Respiratory: has dyspnea, coughing, wheezing CV: has chest pain, no palpitations GI: has nausea, no vomiting, abdominal pain, has diarrhea, no constipation GU: no dysuria, burning on urination, increased urinary frequency, hematuria  Ext: has trace edema in both ankles Neuro: no unilateral weakness, numbness, or tingling, no vision change or hearing loss Skin: no rash, no skin tear. MSK: No muscle spasm, no deformity, no limitation of range of movement in spin Heme: No easy bruising.  Travel history: No  recent long distant travel.   Allergy:  Allergies  Allergen Reactions   Buspirone     Other reaction(s): Other (See Comments), Other (See Comments) Gi UPSET Gi UPSET Gi UPSET Other reaction(s): Other (See Comments) Gi UPSET Gi UPSET    Citalopram Nausea Only   Contrast Media [Iodinated Contrast Media] Hives   Sulfa  Drugs Cross Reactors    Sulfasalazine Other (See Comments)    Past Medical History:  Diagnosis Date   Acute ethmoidal sinusitis    Acute ethmoidal sinusitis    Acute maxillary sinusitis    Acute upper respiratory infections of unspecified site    Anxiety state, unspecified    Chest pain, unspecified    Depression    Diarrhea    Esophageal reflux    Generalized anxiety disorder    Hyperlipidemia    Hypoglycemia, unspecified    Lumbago    Nausea with vomiting    Obstructive chronic bronchitis with exacerbation (HCC)    Other and unspecified hyperlipidemia    Other and unspecified peripheral vertigo(386.19)    Other bursitis disorders    Other malaise and fatigue    Other specified diseases due to viruses    Other vitamin B12 deficiency anemia    Pain in limb    Pernicious anemia    Sleep related hypoventilation/hypoxemia in conditions classifiable elsewhere    Swelling, mass, or lump in head and neck    Tobacco use disorder    Unspecified disorder of external ear    Unspecified disorder of skin and subcutaneous tissue    Unspecified otitis media     Past Surgical History:  Procedure Laterality Date   CESAREAN SECTION     CORONARY/GRAFT ACUTE MI REVASCULARIZATION N/A 07/04/2021   Procedure: Coronary/Graft Acute MI Revascularization;  Surgeon: Wellington Hampshire, MD;  Location: [facility];  Service: Cardiovascular;  Laterality: N/A;   LEFT HEART CATH AND CORONARY ANGIOGRAPHY N/A 07/04/2021   Procedure: LEFT HEART CATH AND CORONARY ANGIOGRAPHY;  Surgeon: Wellington Hampshire, MD;  Location: [facility];  Service: Cardiovascular;  Laterality: N/A;    Social History:  reports that she has been smoking. She has never used smokeless tobacco. She reports that she does not drink alcohol and does not use drugs.  Family History:  Family History  Problem Relation Age of Onset   Depression Mother    Cancer Father        Father   Hypertension Sister    Cancer  Paternal Grandfather        Breast    Breast cancer Maternal Grandmother      Prior to Admission medications   Medication Sig Start Date End Date Taking? Authorizing Provider  acetaminophen (TYLENOL) 500 MG tablet Take by mouth. 01/26/20   [provider]  albuterol (PROVENTIL HFA;VENTOLIN HFA) 108 (90 BASE) MCG/ACT inhaler Inhale 2 puffs into the lungs every 6 (six) hours as needed.      [provider]  albuterol (VENTOLIN HFA) 108 (90 Base) MCG/ACT inhaler Inhale 2 puffs into the lungs every 6 hours as needed for wheezing. 05/13/21     ALPRAZolam (XANAX) 0.5 MG tablet Take 0.5 mg by mouth at bedtime as needed for anxiety.    [provider]  azithromycin (ZITHROMAX Z-PAK) 250 MG tablet Take two tablets the first day. Then take one tablet daily for the next four days. 12/07/20   Lannie Fields, PA-C  cyanocobalamin 1000 MCG tablet Take by mouth. 01/30/20  [provider]  docusate sodium (COLACE) 100 MG capsule Take 1 capsule (100 mg total) by mouth daily as needed. 10/22/20 10/22/21  Sable Feil, PA-C  doxycycline (MONODOX) 100 MG capsule Take 100 mg by mouth 2 (two) times daily. 03/20/20   [provider]  DULoxetine (CYMBALTA) 20 MG capsule Take by mouth. 03/25/20   [provider]  ergocalciferol (VITAMIN D2) 1.25 MG (50000 UT) capsule Take by mouth. 01/28/20   [provider]  FLOVENT HFA 44 MCG/ACT inhaler Inhale 2 puff using inhaler twice a day  with spacer for asthma 03/16/20   [provider]  fluconazole (DIFLUCAN) 100 MG tablet Take 100 mg by mouth daily. 04/22/20   [provider]  guaifenesin (ROBITUSSIN) 100 MG/5ML syrup Take by mouth. 04/07/20   [provider]  HYDROcodone-acetaminophen (NORCO/VICODIN) 5-325 MG tablet Take 1 tablet by mouth every 6 (six) hours as needed for moderate pain. 11/05/20 11/05/21  [name], PA-C  hydrocortisone (ANUSOL-HC) 25 MG suppository Place 1 suppository  (25 mg total) rectally every 12 (twelve) hours. 10/22/20 10/22/21  Sable Feil, PA-C  ibuprofen (ADVIL) 800 MG tablet Take by mouth. 01/13/20   [provider]  lidocaine-prilocaine (EMLA) cream Apply 1 application topically as needed. 10/22/20   Sable Feil, PA-C  medroxyPROGESTERone (DEPO-PROVERA) 150 MG/ML injection Inject into the muscle. 02/10/15   [provider]  MedroxyPROGESTERone Acetate (DEPO-PROVERA IM) Inject into the muscle.      [provider]  metoprolol succinate (TOPROL-XL) 50 MG 24 hr tablet Take 50 mg by mouth 2 (two) times daily. Take with or immediately following a meal.    [provider]  pantoprazole (PROTONIX) 40 MG tablet Take 1 tablet by mouth daily. 09/16/19 09/15/20  [provider]  potassium chloride (KLOR-CON) 10 MEQ tablet Take 4 tablets in the morning, 4 tablets at lunch, and 2 tablets at dinner. 04/08/20   [provider]    Physical Exam: Vitals:   07/04/21 1014 07/04/21 1016 07/04/21 1045 07/04/21 1100  BP: 90/62  117/80 106/70  Pulse: 97  95 95  Resp: 19  19 13   Temp:   97.8 F (36.6 C)   TempSrc:   Oral   SpO2:   99% 97%  Weight:  75.6 kg 80.2 kg   Height:  5\' 1"  (1.549 m) 5\' 1"  (1.549 m)    General: Not in acute distress HEENT:       Eyes: PERRL, EOMI, no scleral icterus.       ENT: No discharge from the ears and nose,        Neck: No JVD, no bruit, no mass felt. Heme: No neck lymph node enlargement. Cardiac: S1/S2, RRR, No murmurs, No gallops or rubs. Respiratory: No rales, wheezing, rhonchi or rubs. GI: Soft, nondistended, nontender, no rebound pain, no organomegaly, BS present. GU: No hematuria Ext: has trace edema in both ankle. 1+DP/PT pulse bilaterally. Musculoskeletal: No joint deformities, No joint redness or warmth, no limitation of ROM in spin. Skin: No rashes.  Neuro: Alert, oriented X3, cranial nerves II-XII grossly intact, moves all extremities normally.  Psych: Patient is  not psychotic, no suicidal or hemocidal ideation.  Labs on Admission: I have personally reviewed following labs and imaging studies  CBC: Recent Labs  Lab 07/04/21 0900  WBC 12.8*  NEUTROABS 6.7  HGB 14.1  HCT 41.5  MCV 91.0  PLT 884   Basic Metabolic Panel: Recent Labs  Lab 07/04/21 0900  NA  135  K 2.4*  CL 103  CO2 21*  GLUCOSE 163*  BUN 8  CREATININE 0.82  CALCIUM 8.6*   GFR: Estimated Creatinine Clearance: 83.2 mL/min (by C-G formula based on SCr of 0.82 mg/dL). Liver Function Tests: Recent Labs  Lab 07/04/21 0900  AST 18  ALT 14  ALKPHOS 84  BILITOT 0.3  PROT 6.7  ALBUMIN 3.7   No results for input(s): LIPASE, AMYLASE in the last 168 hours. No results for input(s): AMMONIA in the last 168 hours. Coagulation Profile: Recent Labs  Lab 07/04/21 0900  INR 1.0   Cardiac Enzymes: No results for input(s): CKTOTAL, CKMB, CKMBINDEX, TROPONINI in the last 168 hours. BNP (last 3 results) No results for input(s): PROBNP in the last 8760 hours. HbA1C: No results for input(s): HGBA1C in the last 72 hours. CBG: Recent Labs  Lab 07/04/21 1044  GLUCAP 118*   Lipid Profile: No results for input(s): CHOL, HDL, LDLCALC, TRIG, CHOLHDL, LDLDIRECT in the last 72 hours. Thyroid Function Tests: No results for input(s): TSH, T4TOTAL, FREET4, T3FREE, THYROIDAB in the last 72 hours. Anemia Panel: No results for input(s): VITAMINB12, FOLATE, FERRITIN, TIBC, IRON, RETICCTPCT in the last 72 hours. Urine analysis:    Component Value Date/Time   COLORURINE COLORLESS (A) 03/21/2016 0937   APPEARANCEUR CLEAR (A) 03/21/2016 0937   LABSPEC 1.002 (L) 03/21/2016 0937   PHURINE 6.0 03/21/2016 0937   GLUCOSEU NEGATIVE 03/21/2016 0937   HGBUR NEGATIVE 03/21/2016 0937   BILIRUBINUR NEGATIVE 03/21/2016 0937   KETONESUR NEGATIVE 03/21/2016 0937   PROTEINUR NEGATIVE 03/21/2016 0937   NITRITE NEGATIVE 03/21/2016 0937   LEUKOCYTESUR NEGATIVE 03/21/2016 0937   Sepsis  Labs: @LABRCNTIP (procalcitonin:4,lacticidven:4) ) Recent Results (from the past 240 hour(s))  Resp Panel by RT-PCR (Flu A&B, Covid) Nasopharyngeal Swab     Status: None   Collection Time: 07/04/21  9:10 AM   Specimen: Nasopharyngeal Swab; Nasopharyngeal(NP) swabs in vial transport medium  Result Value Ref Range Status   SARS Coronavirus 2 by RT PCR NEGATIVE NEGATIVE Final    Comment: (NOTE) SARS-CoV-2 target nucleic acids are NOT DETECTED.  The SARS-CoV-2 RNA is generally detectable in upper respiratory specimens during the acute phase of infection. The lowest concentration of SARS-CoV-2 viral copies this assay can detect is 138 copies/mL. A negative result does not preclude SARS-Cov-2 infection and should not be used as the sole basis for treatment or other patient management decisions. A negative result may occur with  improper specimen collection/handling, submission of specimen other than nasopharyngeal swab, presence of viral mutation(s) within the areas targeted by this assay, and inadequate number of viral copies(<138 copies/mL). A negative result must be combined with clinical observations, patient history, and epidemiological information. The expected result is Negative.  Fact Sheet for Patients:  EntrepreneurPulse.com.au  Fact Sheet for Healthcare Providers:  IncredibleEmployment.be  This test is no t yet approved or cleared by the [place] FDA and  has been authorized for detection and/or diagnosis of SARS-CoV-2 by FDA under an Emergency Use Authorization (EUA). This EUA will remain  in effect (meaning this test can be used) for the duration of the COVID-19 declaration under Section 564(b)(1) of the Act, 21 U.S.C.section 360bbb-3(b)(1), unless the authorization is terminated  or revoked sooner.       Influenza A by PCR NEGATIVE NEGATIVE Final   Influenza B by PCR NEGATIVE NEGATIVE Final    Comment: (NOTE) The Xpert Xpress  SARS-CoV-2/FLU/RSV plus assay is intended as an aid in the diagnosis of influenza  from Nasopharyngeal swab specimens and should not be used as a sole basis for treatment. Nasal washings and aspirates are unacceptable for Xpert Xpress SARS-CoV-2/FLU/RSV testing.  Fact Sheet for Patients: EntrepreneurPulse.com.au  Fact Sheet for Healthcare Providers: IncredibleEmployment.be  This test is not yet approved or cleared by the [place] FDA and has been authorized for detection and/or diagnosis of SARS-CoV-2 by FDA under an Emergency Use Authorization (EUA). This EUA will remain in effect (meaning this test can be used) for the duration of the COVID-19 declaration under Section 564(b)(1) of the Act, 21 U.S.C. section 360bbb-3(b)(1), unless the authorization is terminated or revoked.  Performed at [facility], [address], [place], [place] [address]      Radiological Exams on Admission: CARDIAC CATHETERIZATION  Result Date: 07/04/2021   Prox RCA lesion is 99% stenosed.   Mid LAD lesion is 30% stenosed.   A drug-eluting stent was successfully placed using a STENT ONYX FRONTIER 4.0X22.   Post intervention, there is a 0% residual stenosis.   There is mild left ventricular systolic dysfunction.   LV end diastolic pressure is mildly elevated.   The left ventricular ejection fraction is 45-50% by visual estimate. 1.  Severe one-vessel coronary artery disease with thrombotic subtotal occlusion of the proximal right coronary artery which is the culprit for inferior ST elevation myocardial infarction.  Mild disease affecting the left coronary system. 2.  Mildly reduced LV systolic function and mildly elevated left ventricular end-diastolic pressure at 17 mmHg. 3.  Successful angioplasty and drug-eluting stent placement to the right coronary artery. Recommendations: Given large thrombus burden, continue Aggrastat infusion for 12 hours. Dual antiplatelet  therapy is recommended for at least 12 months. Aggressive treatment of risk factors and smoking cessation.      Assessment/Plan Principal Problem:   ST elevation myocardial infarction (STEMI) of inferior wall (HCC) Active Problems:   Hypokalemia   SVT (supraventricular tachycardia) (HCC)   Depression with anxiety   HLD (hyperlipidemia)   Tobacco user   Leukocytosis   Diarrhea   ST elevation myocardial infarction (STEMI) of inferior wall ([place]): trop 2514. S/p of urgent cardiac cath by Dr. Fletcher Anon, with stent placement.  Her chest pain has resolved.  - admit to ICU as inpatient - Trend Trop - Repeat EKG in the am  - prn Nitroglycerin, Morphine, and aspirin, lipitor  - Risk factor stratification: will check FLP and A1C  - 2d echo - still on Tirofinban tonight per card  Hypokalemia: K 2.4 --> 2.7 -Repleted potassium -Given gram of magnesium sulfate -Check a magnesium level  SVT (supraventricular tachycardia) (HCC) -Metoprolol  Depression with anxiety -Continue home Xanax  HLD (hyperlipidemia) -Lipitor  Tobacco user -Nicotine patch  Leukocytosis: WBC 12.8, no signs of infection, likely reactive -Follow-up with CBC  Diarrhea; -Check C diff     DVT ppx:  SQ Lovenox  Code Status: Full code  Family Communication:    Yes, patient's daughter and fianc   at bed side.     Disposition Plan:  Anticipate discharge back to previous environment  Consults called: Dr. Fletcher Anon of cardiology  Admission status and Level of care: ICU:   as inpt        Severity of Illness:  The appropriate patient status for this patient is INPATIENT. Inpatient status is judged to be reasonable and necessary in order to provide the required intensity of service to ensure the patient's safety. The patient's presenting symptoms, physical exam findings, and initial radiographic and laboratory data in  the context of their chronic comorbidities is felt to place them at high risk for further  clinical deterioration. Furthermore, it is not anticipated that the patient will be medically stable for discharge from the hospital within 2 midnights of admission.   * I certify that at the point of admission it is my clinical judgment that the patient will require inpatient hospital care spanning beyond 2 midnights from the point of admission due to high intensity of service, high risk for further deterioration and high frequency of surveillance required.*       Date of Service 07/04/2021    Ivor Costa [facility]   If 7PM-7AM, please contact night-coverage [email or web address] 07/04/2021, 12:38 PM

## 2021-07-04 NOTE — ED Notes (Signed)
Dr. Fletcher Anon and RN transporting pt to cath lab at this time

## 2021-07-04 NOTE — ED Triage Notes (Addendum)
Pt to ED via [facility] from home. Pt reports centralized CP that radiates the left side of neck and arm that started at 0840am. EMS reports hx anxiety. EKG showing ST elevation and CODE STEMI called in field. Pt given 324 ASA, 1 nitro and 4mg  zofran PTA. Pt very anxious on arrival. BP after nitro 92/59. 20g LAC

## 2021-07-04 NOTE — Plan of Care (Signed)

## 2021-07-04 NOTE — ED Notes (Addendum)
Pt reports allergy to contrast dye and repeat BP 87/59. Verbal order for 125mg  solumedrol and 268ml NS bolus ordered.

## 2021-07-04 NOTE — TOC Benefit Eligibility Note (Signed)
Patient Teacher, English as a foreign language completed.    The patient is currently admitted and upon discharge could be taking Brilinta 90 mg.  The current 30 day co-pay is, $81.60.   The patient is insured through [place], [place] Patient Advocate Specialist [place] Patient Advocate Team Direct Number: [phone]  Fax: [phone]

## 2021-07-04 NOTE — Progress Notes (Signed)
Patient asked this RN to call [facility] and let them know she was here.  Called and Ship broker.

## 2021-07-04 NOTE — Progress Notes (Signed)
*  PRELIMINARY RESULTS* Echocardiogram 2D Echocardiogram has been performed.  Lisa Davila 07/04/2021, 1:08 PM

## 2021-07-04 NOTE — ED Notes (Signed)
Pt transported to cath lab.  

## 2021-07-04 NOTE — Consult Note (Addendum)
Cardiology Consultation:   Patient ID: Lisa Davila; [id number]; [birth year]   Admit date: 07/04/2021 Date of Consult: 07/04/2021  Primary Care Provider: Tracie Harrier, MD Primary Cardiologist: [facility] Primary Electrophysiologist:  None   Patient Profile:   Lisa Davila is a 46 y.o. female with a hx of SVT a/p ablation 09/10/2019, HLD, fibromyalgia, anxiety, depression, obesity, and tobacco use who is being seen today for the evaluation of inferior ST elevation MI at the request of Dr. Kerman Passey.  History of Present Illness:   [name] is followed by Dr. Clayborn Bigness with [facility]. Most recent ischemic evaluation via MPI in 10/2019 was without evidence of ischemia with an EF of 65%. Echo from 10/2019 demonstrated an EF of 50-55%, trivial MR/TR, and normal RV systolic function. She presented to [facility] on 07/04/2021 with chest pressure and dyspnea that began this morning. Given symptoms, she called 911. In the field, EMS noted inferior ST elevation in leads II and III with Code STEMI being activated in the field. Prior to arrival, she was given 323 mg ASA, 1 nitro, and Zofran. Upon arrival to the ED, she was hypotensive with a BP of 87/59 with a heart rate of 99 bpm. Due to contrast allergy, she was premedicated with Benadryl and Solu-Medrol. EMS run sheet not yet uploaded. She was taken emergently to the cath lab.    Past Medical History:  Diagnosis Date   Acute ethmoidal sinusitis    Acute ethmoidal sinusitis    Acute maxillary sinusitis    Acute upper respiratory infections of unspecified site    Anxiety state, unspecified    Chest pain, unspecified    Depression    Diarrhea    Esophageal reflux    Generalized anxiety disorder    Hyperlipidemia    Hypoglycemia, unspecified    Lumbago    Nausea with vomiting    Obstructive chronic bronchitis with exacerbation (HCC)    Other and unspecified hyperlipidemia    Other and unspecified peripheral  vertigo(386.19)    Other bursitis disorders    Other malaise and fatigue    Other specified diseases due to viruses    Other vitamin B12 deficiency anemia    Pain in limb    Pernicious anemia    Sleep related hypoventilation/hypoxemia in conditions classifiable elsewhere    Swelling, mass, or lump in head and neck    Tobacco use disorder    Unspecified disorder of external ear    Unspecified disorder of skin and subcutaneous tissue    Unspecified otitis media     No past surgical history on file.   Home Meds: Prior to Admission medications   Medication Sig Start Date End Date Taking? Authorizing Provider  acetaminophen (TYLENOL) 500 MG tablet Take by mouth. 01/26/20   [provider]  albuterol (PROVENTIL HFA;VENTOLIN HFA) 108 (90 BASE) MCG/ACT inhaler Inhale 2 puffs into the lungs every 6 (six) hours as needed.      [provider]  albuterol (VENTOLIN HFA) 108 (90 Base) MCG/ACT inhaler Inhale 2 puffs into the lungs every 6 hours as needed for wheezing. 05/13/21     ALPRAZolam (XANAX) 0.5 MG tablet Take 0.5 mg by mouth at bedtime as needed for anxiety.    [provider]  azithromycin (ZITHROMAX Z-PAK) 250 MG tablet Take two tablets the first day. Then take one tablet daily for the next four days. 12/07/20   Lannie Fields, PA-C  cyanocobalamin 1000 MCG tablet Take by mouth. 01/30/20  [provider]  docusate sodium (COLACE) 100 MG capsule Take 1 capsule (100 mg total) by mouth daily as needed. 10/22/20 10/22/21  Sable Feil, PA-C  doxycycline (MONODOX) 100 MG capsule Take 100 mg by mouth 2 (two) times daily. 03/20/20   [provider]  DULoxetine (CYMBALTA) 20 MG capsule Take by mouth. 03/25/20   [provider]  ergocalciferol (VITAMIN D2) 1.25 MG (50000 UT) capsule Take by mouth. 01/28/20   [provider]  FLOVENT HFA 44 MCG/ACT inhaler Inhale 2 puff using inhaler twice a day  with spacer for asthma 03/16/20   [provider]  fluconazole (DIFLUCAN) 100 MG tablet Take 100 mg by mouth daily. 04/22/20   [provider]  guaifenesin (ROBITUSSIN) 100 MG/5ML syrup Take by mouth. 04/07/20   [provider]  HYDROcodone-acetaminophen (NORCO/VICODIN) 5-325 MG tablet Take 1 tablet by mouth every 6 (six) hours as needed for moderate pain. 11/05/20 11/05/21  [name], PA-C  hydrocortisone (ANUSOL-HC) 25 MG suppository Place 1 suppository (25 mg total) rectally every 12 (twelve) hours. 10/22/20 10/22/21  Sable Feil, PA-C  ibuprofen (ADVIL) 800 MG tablet Take by mouth. 01/13/20   [provider]  lidocaine-prilocaine (EMLA) cream Apply 1 application topically as needed. 10/22/20   Sable Feil, PA-C  medroxyPROGESTERone (DEPO-PROVERA) 150 MG/ML injection Inject into the muscle. 02/10/15   [provider]  MedroxyPROGESTERone Acetate (DEPO-PROVERA IM) Inject into the muscle.      [provider]  metoprolol succinate (TOPROL-XL) 50 MG 24 hr tablet Take 50 mg by mouth 2 (two) times daily. Take with or immediately following a meal.    [provider]  pantoprazole (PROTONIX) 40 MG tablet Take 1 tablet by mouth daily. 09/16/19 09/15/20  [provider]  potassium chloride (KLOR-CON) 10 MEQ tablet Take 4 tablets in the morning, 4 tablets at lunch, and 2 tablets at dinner. 04/08/20   [provider]    Inpatient Medications: Scheduled Meds:  [MAR Hold] aspirin  324 mg Oral Once   Continuous Infusions:  sodium chloride     PRN Meds:   Allergies:   Allergies  Allergen Reactions   Buspirone     Other reaction(s): Other (See Comments), Other (See Comments) Gi UPSET Gi UPSET Gi UPSET Other reaction(s): Other (See Comments) Gi UPSET Gi UPSET    Citalopram Nausea Only   Contrast Media [Iodinated Contrast Media] Hives   Sulfa Drugs Cross Reactors    Sulfasalazine Other (See Comments)    Social History:   Social History    Socioeconomic History   Marital status: Single    Spouse name: Not on file   Number of children: Not on file   Years of education: Not on file   Highest education level: Not on file  Occupational History   Not on file  Tobacco Use   Smoking status: Every Day   Smokeless tobacco: Never   Tobacco comments:    Has been smoking for 20 years  Vaping Use   Vaping Use: Never used  Substance and Sexual Activity   Alcohol use: No   Drug use: Not on file   Sexual activity: Not on file  Other Topics Concern   Not on file  Social History Narrative   Not on file   Social Determinants of Health   Financial Resource Strain: Not on file  Food Insecurity: Not on file  Transportation Needs: Not on file  Physical Activity: Not on file  Stress:  Not on file  Social Connections: Not on file  Intimate Partner Violence: Not on file     Family History:   Family History  Problem Relation Age of Onset   Depression Mother    Cancer Father        Father   Hypertension Sister    Cancer Paternal Grandfather        Breast    Breast cancer Maternal Grandmother     ROS:  Review of Systems  Unable to perform ROS: Acuity of condition     Physical Exam/Data:   Vitals:   07/04/21 0915 07/04/21 0920 07/04/21 0922  BP: (!) 87/59    Pulse: 99 100   Resp: (!) 23 (!) 23   Temp:   98.3 F (36.8 C)  TempSrc:   Oral  SpO2: 97% 99%    No intake or output data in the 24 hours ending 07/04/21 0928 There were no vitals filed for this visit. There is no height or weight on file to calculate BMI.   Physical Exam: See MD attestation.   EKG:  The EKG was personally reviewed and demonstrates: NSR, 98 bpm, 1st degree AV block, inferior ST elevation with reciprocal changes  Telemetry:  Telemetry was personally reviewed and demonstrates: SR  Weights: There were no vitals filed for this visit.  Relevant CV Studies:  Nuclear stress test 10/2019 [facility]): FINDINGS:  Regional wall motion:   reveals normal myocardial thickening and wall  motion.  The overall quality of the study is good.    Artifacts noted: no  Left ventricular cavity: normal.  __________  2D echo 10/17/2019 [facility]): INTERPRETATION  NORMAL LEFT VENTRICULAR SYSTOLIC FUNCTION  NORMAL RIGHT VENTRICULAR SYSTOLIC FUNCTION  TRIVIAL REGURGITATION NOTED (See above)  NO VALVULAR STENOSIS  TRIVIAL MR, TR  EF 50-55%    Laboratory Data:  ChemistryNo results for input(s): NA, K, CL, CO2, GLUCOSE, BUN, CREATININE, CALCIUM, GFRNONAA, GFRAA, ANIONGAP in the last 168 hours.  No results for input(s): PROT, ALBUMIN, AST, ALT, ALKPHOS, BILITOT in the last 168 hours. HematologyNo results for input(s): WBC, RBC, HGB, HCT, MCV, MCH, MCHC, RDW, PLT in the last 168 hours. Cardiac EnzymesNo results for input(s): TROPONINI in the last 168 hours. No results for input(s): TROPIPOC in the last 168 hours.  BNPNo results for input(s): BNP, PROBNP in the last 168 hours.  DDimer No results for input(s): DDIMER in the last 168 hours.  Radiology/Studies:  No results found.  Assessment and Plan:   1. Inferior ST elevation: -Taken emergently to the cath lab -ASA -Heparin gtt -PRN SL NTG and morphine  -Obtain echo with recommendation to escalate GDMT as indicated -Labs are pending as chemistry is down in the hospital currently  -Cardiac rehab -Aggressive risk factor modification and secondary prevention -[facility] to assume care 07/05/2021  2. SVT: -Status post ablation  -Resume PTA Toprol XL as able post cath -Recommend maintaining potassium and magnesium at goal 4.0 and 2.0 respectively (chemistry currently down) -Recent TSH normal -Follow up with primary cardiologist   3. HLD: -LDL 174 in 01/2020 -Goal LDL < 50 -Not on a statin PTA -Post-cath, recommend starting Lipitor 80 mg -Escalate statin therapy as indicated to achieve target LDL as an outpatient    Shared Decision Making/Informed Consent{  The  risks [stroke (1 in 1000), death (1 in 1000), kidney failure [usually temporary] (1 in 500), bleeding (1 in 200), allergic reaction [possibly serious] (1 in 200)], benefits (diagnostic support and management of coronary artery disease)  and alternatives of a cardiac catheterization were discussed in detail with Ms. Cara and she is willing to proceed.    For questions or updates, please contact [place] Please consult [email or web address] for contact info under Cardiology/STEMI.   Signed, Christell Faith, PA-C [facility] Pager: [phone] 07/04/2021, 9:28 AM

## 2021-07-04 NOTE — ED Notes (Addendum)
Dr. Fletcher Anon, cardiologist at bedside.

## 2021-07-04 NOTE — Progress Notes (Signed)
Patient is refusing to not drink caffeine.  Educated patient on the need for a low salt diet and to avoid caffeine as it can cause chest discomfort.  Patient thanked me for education and said she will be drinking caffeine as diet doesn't taste good.  Will attempt a low salt diet.

## 2021-07-04 NOTE — Plan of Care (Signed)
Patient arrived from cath lab post stemi.  Aggrastat running for 12 hours.  Vitals stable at this time.

## 2021-07-05 LAB — BASIC METABOLIC PANEL
Anion gap: 1 — ABNORMAL LOW (ref 5–15)
BUN: 7 mg/dL (ref 6–20)
CO2: 25 mmol/L (ref 22–32)
Calcium: 8.7 mg/dL — ABNORMAL LOW (ref 8.9–10.3)
Chloride: 113 mmol/L — ABNORMAL HIGH (ref 98–111)
Creatinine, Ser: 0.78 mg/dL (ref 0.44–1.00)
GFR, Estimated: >60 mL/min (ref >60)
Glucose, Bld: 119 mg/dL — ABNORMAL HIGH (ref 70–99)
Potassium: 4.3 mmol/L (ref 3.5–5.1)
Sodium: 139 mmol/L (ref 135–145)

## 2021-07-05 LAB — CBC
HCT: 36.4 % (ref 36.0–46.0)
Hemoglobin: 12.2 g/dL (ref 12.0–15.0)
MCH: 30.9 pg (ref 26.0–34.0)
MCHC: 33.5 g/dL (ref 30.0–36.0)
MCV: 92.2 fL (ref 80.0–100.0)
Platelets: 313 10*3/uL (ref 150–400)
RBC: 3.95 MIL/uL (ref 3.87–5.11)
RDW: 13.4 % (ref 11.5–15.5)
WBC: 17.7 10*3/uL — ABNORMAL HIGH (ref 4.0–10.5)
nRBC: 0 % (ref 0.0–0.2)

## 2021-07-05 LAB — TROPONIN I (HIGH SENSITIVITY): Troponin I (High Sensitivity): 5481 ng/L (ref <18)

## 2021-07-05 MED ORDER — ATORVASTATIN CALCIUM 80 MG PO TABS: 80.0000 mg | ORAL_TABLET | Freq: Every day | ORAL | 0 refills | Status: DC

## 2021-07-05 MED ORDER — ASPIRIN 81 MG PO CHEW: 81.0000 mg | CHEWABLE_TABLET | Freq: Every day | ORAL | Status: AC

## 2021-07-05 MED ORDER — TICAGRELOR 90 MG PO TABS: 90.0000 mg | ORAL_TABLET | Freq: Two times a day (BID) | ORAL | 0 refills | Status: DC

## 2021-07-05 MED ORDER — NICOTINE 21 MG/24HR TD PT24: 21.0000 mg | MEDICATED_PATCH | Freq: Every day | TRANSDERMAL | 0 refills | Status: DC

## 2021-07-05 MED ORDER — FAMOTIDINE 20 MG PO TABS
20.0000 mg | ORAL_TABLET | Freq: Every day | ORAL | Status: DC
Start: 2021-07-05 — End: 2021-07-05
  Administered 2021-07-05: 20 mg via ORAL

## 2021-07-05 MED ORDER — METOPROLOL SUCCINATE ER 50 MG PO TB24
25.0000 mg | ORAL_TABLET | Freq: Every day | ORAL | Status: DC
Start: 2021-07-05 — End: 2021-07-05
  Filled 2021-07-05: qty 1

## 2021-07-05 MED ORDER — NITROGLYCERIN 0.4 MG SL SUBL: 0.4000 mg | SUBLINGUAL_TABLET | SUBLINGUAL | 0 refills | Status: DC | PRN

## 2021-07-05 MED ORDER — METOPROLOL SUCCINATE ER 25 MG PO TB24
25.0000 mg | ORAL_TABLET | Freq: Every day | ORAL | 0 refills | Status: DC
Start: 2021-07-06 — End: 2021-10-25

## 2021-07-05 NOTE — Progress Notes (Signed)
Patient has been insistent on going home today. Talked to Dr.Sreenath this am. Also requested to nurse to be discharged all morning long. Explained that nurse could not discharge her. Secure chatted Dr. Clayborn Bigness, Fletcher Anon, and [place] about her request for immediate discharge. Dr Josefa Half and PA Tang for cardiology by to assess patient. 1250 Discharge teaching done. Follow up appointment with Dr. Clayborn Bigness made for March 10 at 1045. All questions answered. 1300 Discharged via wheelchair with significant other.

## 2021-07-05 NOTE — Discharge Instructions (Signed)
Follow up with [facility]  March 10 at 1045 am.

## 2021-07-05 NOTE — Progress Notes (Signed)
°  Transition of Care [facility]) Screening Note   Patient Details  Name: Lisa Davila Date of Birth: 11/27/1975   Transition of Care [facility]) CM/SW Contact:    Shelbie Hutching, RN Phone Number: 07/05/2021, 10:35 AM    Transition of Care Department [facility]) has reviewed patient and no TOC needs have been identified at this time. We will continue to monitor patient advancement through interdisciplinary progression rounds. If new patient transition needs arise, please place a TOC consult.

## 2021-07-05 NOTE — Progress Notes (Signed)
[facility] Cardiology    SUBJECTIVE: 45yoF with a PMH of SVT, hyperlipidemia, fibromyalgia, anxiety, depression, and tobacco use who presented to [facility] ED 07/04/21 with chest pain and was found to have an inferior STEMI. She underwent successful PCI with DES to RCA by Dr. Fletcher Anon on 07/04/21 and aggristat infusion for 12 hours post procedure.   Interval History:  - denies chest pain, SOB, palpitations. - has been ambulating well this morning without difficulty and is eager to go home.    Vitals:   07/05/21 0400 07/05/21 0500 07/05/21 0600 07/05/21 0700  BP: (!) 102/58 (!) 86/57 119/75 126/68  Pulse: 71 85 79 76  Resp: 18 16 16 20   Temp:      TempSrc:      SpO2: 97% 96% 98% 98%  Weight:      Height:         Intake/Output Summary (Last 24 hours) at 07/05/2021 0758 Last data filed at 07/05/2021 0524 Gross per 24 hour  Intake 1792.34 ml  Output 1600 ml  Net 192.34 ml     PHYSICAL EXAM  General: Pleasant caucasian female, well nourished, in no acute distress. Sitting on edge of ICU bed with fiance at bedside.  HEENT:  Normocephalic and atraumatic. Neck:  No JVD.  Lungs: Normal respiratory effort on room air. Clear bilaterally to auscultation. No wheezes, crackles, rhonchi.  Heart: HRRR . Normal S1 and S2 without gallops or murmurs. Radial & DP pulses 2+ bilaterally. Abdomen: Non-distended appearing.  Msk: Normal strength and tone for age. Extremities: No clubbing, cyanosis or edema. Right wrist with gauze and tegaderm in place without tenderness or surrounding erythema Neuro: Alert and oriented X 3. Psych:  Mood appropriate, affect congruent.    LABS: Basic Metabolic Panel: Recent Labs    07/04/21 0900 07/04/21 1431 07/05/21 0328  NA 135  --  139  K 2.4* 2.7* 4.3  CL 103  --  113*  CO2 21*  --  25  GLUCOSE 163*  --  119*  BUN 8  --  7  CREATININE 0.82  --  0.78  CALCIUM 8.6*  --  8.7*  MG 2.0  --   --    Liver Function Tests: Recent Labs    07/04/21 0900  AST 18  ALT  14  ALKPHOS 84  BILITOT 0.3  PROT 6.7  ALBUMIN 3.7   No results for input(s): LIPASE, AMYLASE in the last 72 hours. CBC: Recent Labs    07/04/21 0900 07/05/21 0328  WBC 12.8* 17.7*  NEUTROABS 6.7  --   HGB 14.1 12.2  HCT 41.5 36.4  MCV 91.0 92.2  PLT 395 313   Cardiac Enzymes: No results for input(s): CKTOTAL, CKMB, CKMBINDEX, TROPONINI in the last 72 hours. BNP: Invalid input(s): POCBNP D-Dimer: No results for input(s): DDIMER in the last 72 hours. Hemoglobin A1C: Recent Labs    07/04/21 0900  HGBA1C 5.3   Fasting Lipid Panel: Recent Labs    07/04/21 0900  CHOL 276*  HDL 33*  LDLCALC NOT CALCULATED  TRIG 183*  CHOLHDL 8.4   Thyroid Function Tests: No results for input(s): TSH, T4TOTAL, T3FREE, THYROIDAB in the last 72 hours.  Invalid input(s): FREET3 Anemia Panel: No results for input(s): VITAMINB12, FOLATE, FERRITIN, TIBC, IRON, RETICCTPCT in the last 72 hours.  CARDIAC CATHETERIZATION  Result Date: 07/04/2021   Prox RCA lesion is 99% stenosed.   Mid LAD lesion is 30% stenosed.   A drug-eluting stent was successfully placed using a STENT  ONYX FRONTIER [id number].   Post intervention, there is a 0% residual stenosis.   There is mild left ventricular systolic dysfunction.   LV end diastolic pressure is mildly elevated.   The left ventricular ejection fraction is 45-50% by visual estimate. 1.  Severe one-vessel coronary artery disease with thrombotic subtotal occlusion of the proximal right coronary artery which is the culprit for inferior ST elevation myocardial infarction.  Mild disease affecting the left coronary system. 2.  Mildly reduced LV systolic function and mildly elevated left ventricular end-diastolic pressure at 17 mmHg. 3.  Successful angioplasty and drug-eluting stent placement to the right coronary artery. Recommendations: Given large thrombus burden, continue Aggrastat infusion for 12 hours. Dual antiplatelet therapy is recommended for at least 12 months.  Aggressive treatment of risk factors and smoking cessation.   DG Chest Port 1 View  Result Date: 07/04/2021 CLINICAL DATA:  Chest pain, STEMI EXAM: PORTABLE CHEST 1 VIEW COMPARISON:  Portable exam 1230 hours compared to 12/07/2020 FINDINGS: External pacing leads project over chest. Normal heart size, mediastinal contours, and pulmonary vascularity. Lungs clear. No pulmonary infiltrate, pleural effusion, or pneumothorax. Osseous structures unremarkable. IMPRESSION: No acute abnormalities. Electronically Signed   By: Lavonia Dana M.D.   On: 07/04/2021 12:43   ECHOCARDIOGRAM COMPLETE  Result Date: 07/04/2021    ECHOCARDIOGRAM REPORT   Patient Name:   JINNIE ONLEY Date of Exam: 07/04/2021 Medical Rec #:  [id number]         Height:       61.0 in Accession #:    [id number]        Weight:       176.8 lb Date of Birth:  March 26, 1976         BSA:          1.792 m Patient Age:    61 years          BP:           108/76 mmHg Patient Gender: F                 HR:           94 bpm. Exam Location:  [facility] Procedure: 2D Echo, Color Doppler and Cardiac Doppler Indications:     I21.9 Acute Myocardial infarction, unspecified  History:         Patient has no prior history of Echocardiogram examinations.                  Risk Factors:Current Smoker and Dyslipidemia.  Sonographer:     Charmayne Sheer Referring Phys:  [id number] NTIRWERX A ARIDA Diagnosing Phys: Isaias Cowman MD  Sonographer Comments: Suboptimal parasternal window and no subcostal window. IMPRESSIONS  1. Left ventricular ejection fraction, by estimation, is 50 to 55%. The left ventricle has low normal function. The left ventricle has no regional wall motion abnormalities. There is mild left ventricular hypertrophy. Left ventricular diastolic parameters are indeterminate.  2. Right ventricular systolic function is normal. The right ventricular size is normal.  3. The mitral valve is normal in structure. Trivial mitral valve regurgitation. No evidence of mitral stenosis.   4. The aortic valve is normal in structure. Aortic valve regurgitation is not visualized. No aortic stenosis is present.  5. The inferior vena cava is normal in size with greater than 50% respiratory variability, suggesting right atrial pressure of 3 mmHg. FINDINGS  Left Ventricle: Left ventricular ejection fraction, by estimation, is 50 to 55%. The left ventricle has low normal function.  The left ventricle has no regional wall motion abnormalities. The left ventricular internal cavity size was normal in size. There is mild left ventricular hypertrophy. Left ventricular diastolic parameters are indeterminate. Right Ventricle: The right ventricular size is normal. No increase in right ventricular wall thickness. Right ventricular systolic function is normal. Left Atrium: Left atrial size was normal in size. Right Atrium: Right atrial size was normal in size. Pericardium: There is no evidence of pericardial effusion. Mitral Valve: The mitral valve is normal in structure. Trivial mitral valve regurgitation. No evidence of mitral valve stenosis. MV peak gradient, 4.7 mmHg. The mean mitral valve gradient is 2.0 mmHg. Tricuspid Valve: The tricuspid valve is normal in structure. Tricuspid valve regurgitation is trivial. No evidence of tricuspid stenosis. Aortic Valve: The aortic valve is normal in structure. Aortic valve regurgitation is not visualized. No aortic stenosis is present. Aortic valve mean gradient measures 3.0 mmHg. Aortic valve peak gradient measures 5.9 mmHg. Aortic valve area, by VTI measures 2.52 cm. Pulmonic Valve: The pulmonic valve was normal in structure. Pulmonic valve regurgitation is not visualized. No evidence of pulmonic stenosis. Aorta: The aortic root is normal in size and structure. Venous: The inferior vena cava is normal in size with greater than 50% respiratory variability, suggesting right atrial pressure of 3 mmHg. IAS/Shunts: No atrial level shunt detected by color flow Doppler.  LEFT  VENTRICLE PLAX 2D LVIDd:         4.90 cm     Diastology LVIDs:         3.80 cm     LV e' medial:    5.77 cm/s LV PW:         0.98 cm     LV E/e' medial:  9.1 LV IVS:        0.80 cm     LV e' lateral:   6.31 cm/s LVOT diam:     2.10 cm     LV E/e' lateral: 8.3 LV SV:         52 LV SV Index:   29 LVOT Area:     3.46 cm  LV Volumes (MOD) LV vol d, MOD A2C: 66.8 ml LV vol d, MOD A4C: 69.7 ml LV vol s, MOD A2C: 27.8 ml LV vol s, MOD A4C: 33.6 ml LV SV MOD A2C:     39.0 ml LV SV MOD A4C:     69.7 ml LV SV MOD BP:      39.3 ml LEFT ATRIUM           Index LA diam:      3.20 cm 1.79 cm/m LA Vol (A2C): 24.4 ml 13.61 ml/m  AORTIC VALVE                    PULMONIC VALVE AV Area (Vmax):    2.41 cm     PV Vmax:       1.02 m/s AV Area (Vmean):   2.49 cm     PV Vmean:      77.900 cm/s AV Area (VTI):     2.52 cm     PV VTI:        0.185 m AV Vmax:           121.00 cm/s  PV Peak grad:  4.2 mmHg AV Vmean:          81.300 cm/s  PV Mean grad:  3.0 mmHg AV VTI:  0.206 m AV Peak Grad:      5.9 mmHg AV Mean Grad:      3.0 mmHg LVOT Vmax:         84.20 cm/s LVOT Vmean:        58.500 cm/s LVOT VTI:          0.150 m LVOT/AV VTI ratio: 0.73  AORTA Ao Root diam: 3.30 cm MITRAL VALVE MV Area (PHT): 6.02 cm    SHUNTS MV Area VTI:   2.75 cm    Systemic VTI:  0.15 m MV Peak grad:  4.7 mmHg    Systemic Diam: 2.10 cm MV Mean grad:  2.0 mmHg MV Vmax:       1.08 m/s MV Vmean:      69.8 cm/s MV Decel Time: 126 msec MV E velocity: 52.30 cm/s MV A velocity: 99.00 cm/s MV E/A ratio:  0.53 Isaias Cowman MD Electronically signed by Isaias Cowman MD Signature Date/Time: 07/04/2021/1:16:31 PM    Final      ECHO LVEF 50-55% w/o WMA or valvular pathologies  TELEMETRY reviewed by me: NSR rate 88, no acute events  EKG reviewed by me: STE leads II, III, aVF rate 98 on 07/04/21  ASSESSMENT AND PLAN:  Principal Problem:   ST elevation myocardial infarction (STEMI) of inferior wall (HCC) Active Problems:   Hypokalemia   SVT  (supraventricular tachycardia) (HCC)   Depression with anxiety   HLD (hyperlipidemia)   Tobacco user   Leukocytosis   Diarrhea   45yoF with a PMH of SVT, hyperlipidemia, fibromyalgia, anxiety, depression, and tobacco use who presented to [facility] ED 07/04/21 with chest pain and was found to have an inferior STEMI. She underwent successful PCI with DES to RCA by Dr. Fletcher Anon on 07/04/21 and aggristat infusion for 12 hours post procedure.   #inferior STEMI  -s/p successful PCI with DES to RCA by Dr. Fletcher Anon on 2/27 -Echo resulted with preserved EF of 50-55%  -continue DAPT with ASA 81mg  daily and brillinta 90mg  BID uninterrupted for 49yr. She has the coupon for 1 month free of brillinta. -continue metoprolol succinate 25mg  once daily -continue atorvastatin 80mg  once daily -referral to cardiac rehab  -ok for discharge from a cardiac standpoint today and will need follow up with Dr. Clayborn Bigness in office in 1-2 weeks.   #h/o SVT -per patient and review of notes, she had EP study performed with Dr. Marcello Moores in 09/2019 but no SVT could be induced so no ablation was performed -continue metoprolol succinate 25mg    #hyperlipidemia -lipid panel this admission with TC 276, HDL 33, LDL not calc, Trig 183 -initiate atorvastatin as above -discussed heart healthy diet with patient  #tobacco use -patient admits to smoking 2PPD as she has been under significant stress for the past few weeks, previously smoking 1PPD -strongly recommended smoking cessation, will provide smoking cessation materials at discharge.   This patient's case was discussed and created with Dr. Isaias Cowman and he is in agreement.  Tristan Schroeder, PA-C 07/05/2021 7:58 AM

## 2021-07-05 NOTE — Discharge Summary (Signed)
Physician Discharge Summary  Lisa Davila YSA:[id number] DOB: 10/31/1975 DOA: 07/04/2021  PCP: Tracie Harrier, MD  Admit date: 07/04/2021 Discharge date: 07/05/2021  Admitted From: Home Disposition: Home  Recommendations for Outpatient Follow-up:  Follow up with PCP in 1-2 weeks Follow-up with cardiology Dr. Clayborn Bigness 3/10 at 10:45 AM  Home Health: No Equipment/Devices: None  Discharge Condition: Stable CODE STATUS: Full Diet recommendation: Heart healthy  Brief/Interim Summary:   46 y.o. female with medical history significant of SVT, GERD, depression with anxiety, tobacco use, pernicious anemia, vertigo, dysphagia, who presents with chest pain.   Patient states that her chest pain started at about 8:40 AM, which is located in substernal area, pressure-like, 10 out of 10 in severity, radiating to the left back of neck, associated with mild shortness of breath.  She has chronic dry cough which she attributed to smoking.  No fever or chills.  Patient states that she has had 4-5 times of diarrhea today, had nausea which has resolved, no vomiting or abdominal pain.  No symptoms of UTI.   Pt was found to have ST elevation in inferior leads, cardiology is consulted, Dr. Sophronia Simas did urgent cardiac cath with stent placement.  After the procedure, her chest pain is resolved.  Of note, patient has history of contrast allergy, she was premedicated with Benadryl and Solu-Medrol. EMS run sheet not yet uploaded. She was taken emergently to the cath lab.  Proximal RCA 99% stenosed.  Interventional cardiology deployed 1 drug-eluting stent with recovery of appropriate flow.  Patient was on Aggrastat infusion for 12 hours.  Dual antiplatelet therapy recommended for 12 months.  Aggressive treatment for risk factors and smoking cessation    Discharge Diagnoses:  Principal Problem:   ST elevation myocardial infarction (STEMI) of inferior wall (HCC) Active Problems:   Hypokalemia   SVT  (supraventricular tachycardia) (HCC)   Depression with anxiety   HLD (hyperlipidemia)   Tobacco user   Leukocytosis   Diarrhea  Acute ST elevation MI RCA 99% stenosed on emergent cath Patient underwent PCI with DES to RCA Plan: Discharge home.  Aggressive risk factor modification.  Beta-blocker, high intensity statin, dual antiplatelet therapy, smoking cessation strongly recommended.  Follow-up outpatient cardiology Dr. Clayborn Bigness.  Discharge Instructions  Discharge Instructions     AMB Referral to Cardiac Rehabilitation - Phase II   Complete by: As directed    Diagnosis:  Coronary Stents STEMI     After initial evaluation and assessments completed: Virtual Based Care may be provided alone or in conjunction with Phase 2 Cardiac Rehab based on patient barriers.: Yes   Diet - low sodium heart healthy   Complete by: As directed    Increase activity slowly   Complete by: As directed       Allergies as of 07/05/2021       Reactions   Buspirone    Other reaction(s): Other (See Comments), Other (See Comments) Gi UPSET Gi UPSET Gi UPSET Other reaction(s): Other (See Comments) Gi UPSET Gi UPSET   Citalopram Nausea Only   Contrast Media [iodinated Contrast Media] Hives   Sulfa Drugs Cross Reactors    Sulfasalazine Other (See Comments)        Medication List     TAKE these medications    acetaminophen 500 MG tablet Commonly known as: TYLENOL Take by mouth.   albuterol 108 (90 Base) MCG/ACT inhaler Commonly known as: VENTOLIN HFA Inhale 2 puffs into the lungs every 6 hours as needed for wheezing.   ALPRAZolam 0.5  MG tablet Commonly known as: XANAX Take 0.5 mg by mouth at bedtime as needed for anxiety.   aspirin 81 MG chewable tablet Chew 1 tablet (81 mg total) by mouth daily. Start taking on: July 06, 2021   atorvastatin 80 MG tablet Commonly known as: LIPITOR Take 1 tablet (80 mg total) by mouth daily. Start taking on: July 06, 2021   cyanocobalamin 1000  MCG tablet Take by mouth daily.   metoprolol succinate 25 MG 24 hr tablet Commonly known as: TOPROL-XL Take 1 tablet (25 mg total) by mouth daily. Take with or immediately following a meal. Start taking on: July 06, 2021 What changed:  medication strength how much to take when to take this   nicotine 21 mg/24hr patch Commonly known as: NICODERM CQ - dosed in mg/24 hours Place 1 patch (21 mg total) onto the skin daily.   nitroGLYCERIN 0.4 MG SL tablet Commonly known as: NITROSTAT Place 1 tablet (0.4 mg total) under the tongue every 5 (five) minutes as needed for chest pain.   ticagrelor 90 MG Tabs tablet Commonly known as: BRILINTA Take 1 tablet (90 mg total) by mouth 2 (two) times daily.        Follow-up Information     Yolonda Kida, MD. Go in 1 week(s).   Specialties: Cardiology, Internal Medicine Why: 1-2 weeks after discharge Contact information: [place] [address] [phone]                Allergies  Allergen Reactions   Buspirone     Other reaction(s): Other (See Comments), Other (See Comments) Gi UPSET Gi UPSET Gi UPSET Other reaction(s): Other (See Comments) Gi UPSET Gi UPSET    Citalopram Nausea Only   Contrast Media [Iodinated Contrast Media] Hives   Sulfa Drugs Cross Reactors    Sulfasalazine Other (See Comments)    Consultations: Cardiology   Procedures/Studies: CARDIAC CATHETERIZATION  Result Date: 07/04/2021   Prox RCA lesion is 99% stenosed.   Mid LAD lesion is 30% stenosed.   A drug-eluting stent was successfully placed using a STENT ONYX FRONTIER [id number].   Post intervention, there is a 0% residual stenosis.   There is mild left ventricular systolic dysfunction.   LV end diastolic pressure is mildly elevated.   The left ventricular ejection fraction is 45-50% by visual estimate. 1.  Severe one-vessel coronary artery disease with thrombotic subtotal occlusion of the proximal right coronary artery which  is the culprit for inferior ST elevation myocardial infarction.  Mild disease affecting the left coronary system. 2.  Mildly reduced LV systolic function and mildly elevated left ventricular end-diastolic pressure at 17 mmHg. 3.  Successful angioplasty and drug-eluting stent placement to the right coronary artery. Recommendations: Given large thrombus burden, continue Aggrastat infusion for 12 hours. Dual antiplatelet therapy is recommended for at least 12 months. Aggressive treatment of risk factors and smoking cessation.   DG Chest Port 1 View  Result Date: 07/04/2021 CLINICAL DATA:  Chest pain, STEMI EXAM: PORTABLE CHEST 1 VIEW COMPARISON:  Portable exam 1230 hours compared to 12/07/2020 FINDINGS: External pacing leads project over chest. Normal heart size, mediastinal contours, and pulmonary vascularity. Lungs clear. No pulmonary infiltrate, pleural effusion, or pneumothorax. Osseous structures unremarkable. IMPRESSION: No acute abnormalities. Electronically Signed   By: Lavonia Dana M.D.   On: 07/04/2021 12:43   ECHOCARDIOGRAM COMPLETE  Result Date: 07/04/2021    ECHOCARDIOGRAM REPORT   Patient Name:   Lisa Davila Date of Exam: 07/04/2021  Medical Rec #:  [id number]         Height:       61.0 in Accession #:    [id number]        Weight:       176.8 lb Date of Birth:  [birth year]         BSA:          1.792 m Patient Age:    46 years          BP:           108/76 mmHg Patient Gender: F                 HR:           94 bpm. Exam Location:  [facility] Procedure: 2D Echo, Color Doppler and Cardiac Doppler Indications:     I21.9 Acute Myocardial infarction, unspecified  History:         Patient has no prior history of Echocardiogram examinations.                  Risk Factors:Current Smoker and Dyslipidemia.  Sonographer:     Charmayne Sheer Referring Phys:  [id number] WYOVZCHY A ARIDA Diagnosing Phys: Isaias Cowman MD  Sonographer Comments: Suboptimal parasternal window and no subcostal window. IMPRESSIONS  1. Left  ventricular ejection fraction, by estimation, is 50 to 55%. The left ventricle has low normal function. The left ventricle has no regional wall motion abnormalities. There is mild left ventricular hypertrophy. Left ventricular diastolic parameters are indeterminate.  2. Right ventricular systolic function is normal. The right ventricular size is normal.  3. The mitral valve is normal in structure. Trivial mitral valve regurgitation. No evidence of mitral stenosis.  4. The aortic valve is normal in structure. Aortic valve regurgitation is not visualized. No aortic stenosis is present.  5. The inferior vena cava is normal in size with greater than 50% respiratory variability, suggesting right atrial pressure of 3 mmHg. FINDINGS  Left Ventricle: Left ventricular ejection fraction, by estimation, is 50 to 55%. The left ventricle has low normal function. The left ventricle has no regional wall motion abnormalities. The left ventricular internal cavity size was normal in size. There is mild left ventricular hypertrophy. Left ventricular diastolic parameters are indeterminate. Right Ventricle: The right ventricular size is normal. No increase in right ventricular wall thickness. Right ventricular systolic function is normal. Left Atrium: Left atrial size was normal in size. Right Atrium: Right atrial size was normal in size. Pericardium: There is no evidence of pericardial effusion. Mitral Valve: The mitral valve is normal in structure. Trivial mitral valve regurgitation. No evidence of mitral valve stenosis. MV peak gradient, 4.7 mmHg. The mean mitral valve gradient is 2.0 mmHg. Tricuspid Valve: The tricuspid valve is normal in structure. Tricuspid valve regurgitation is trivial. No evidence of tricuspid stenosis. Aortic Valve: The aortic valve is normal in structure. Aortic valve regurgitation is not visualized. No aortic stenosis is present. Aortic valve mean gradient measures 3.0 mmHg. Aortic valve peak gradient  measures 5.9 mmHg. Aortic valve area, by VTI measures 2.52 cm. Pulmonic Valve: The pulmonic valve was normal in structure. Pulmonic valve regurgitation is not visualized. No evidence of pulmonic stenosis. Aorta: The aortic root is normal in size and structure. Venous: The inferior vena cava is normal in size with greater than 50% respiratory variability, suggesting right atrial pressure of 3 mmHg. IAS/Shunts: No atrial level shunt detected by color flow Doppler.  LEFT VENTRICLE PLAX 2D LVIDd:  4.90 cm     Diastology LVIDs:         3.80 cm     LV e' medial:    5.77 cm/s LV PW:         0.98 cm     LV E/e' medial:  9.1 LV IVS:        0.80 cm     LV e' lateral:   6.31 cm/s LVOT diam:     2.10 cm     LV E/e' lateral: 8.3 LV SV:         52 LV SV Index:   29 LVOT Area:     3.46 cm  LV Volumes (MOD) LV vol d, MOD A2C: 66.8 ml LV vol d, MOD A4C: 69.7 ml LV vol s, MOD A2C: 27.8 ml LV vol s, MOD A4C: 33.6 ml LV SV MOD A2C:     39.0 ml LV SV MOD A4C:     69.7 ml LV SV MOD BP:      39.3 ml LEFT ATRIUM           Index LA diam:      3.20 cm 1.79 cm/m LA Vol (A2C): 24.4 ml 13.61 ml/m  AORTIC VALVE                    PULMONIC VALVE AV Area (Vmax):    2.41 cm     PV Vmax:       1.02 m/s AV Area (Vmean):   2.49 cm     PV Vmean:      77.900 cm/s AV Area (VTI):     2.52 cm     PV VTI:        0.185 m AV Vmax:           121.00 cm/s  PV Peak grad:  4.2 mmHg AV Vmean:          81.300 cm/s  PV Mean grad:  3.0 mmHg AV VTI:            0.206 m AV Peak Grad:      5.9 mmHg AV Mean Grad:      3.0 mmHg LVOT Vmax:         84.20 cm/s LVOT Vmean:        58.500 cm/s LVOT VTI:          0.150 m LVOT/AV VTI ratio: 0.73  AORTA Ao Root diam: 3.30 cm MITRAL VALVE MV Area (PHT): 6.02 cm    SHUNTS MV Area VTI:   2.75 cm    Systemic VTI:  0.15 m MV Peak grad:  4.7 mmHg    Systemic Diam: 2.10 cm MV Mean grad:  2.0 mmHg MV Vmax:       1.08 m/s MV Vmean:      69.8 cm/s MV Decel Time: 126 msec MV E velocity: 52.30 cm/s MV A velocity: 99.00 cm/s MV  E/A ratio:  0.53 Isaias Cowman MD Electronically signed by Isaias Cowman MD Signature Date/Time: 07/04/2021/1:16:31 PM    Final       Subjective: Seen and examined the day of discharge.  Stable no distress.  Chest pain resolved.  Stable for discharge home.  Discharge Exam: Vitals:   07/05/21 0944 07/05/21 1000  BP: 100/75   Pulse: 76 85  Resp:  (!) 21  Temp:    SpO2:  100%   Vitals:   07/05/21 0800 07/05/21 0900 07/05/21 0944 07/05/21 1000  BP:   100/75  Pulse: 74 80 76 85  Resp: (!) 25 (!) 25  (!) 21  Temp:      TempSrc:      SpO2: 100% 98%  100%  Weight:      Height:        General: Pt is alert, awake, not in acute distress Cardiovascular: RRR, S1/S2 +, no rubs, no gallops Respiratory: CTA bilaterally, no wheezing, no rhonchi Abdominal: Soft, NT, ND, bowel sounds + Extremities: no edema, no cyanosis    The results of significant diagnostics from this hospitalization (including imaging, microbiology, ancillary and laboratory) are listed below for reference.     Microbiology: Recent Results (from the past 240 hour(s))  Resp Panel by RT-PCR (Flu A&B, Covid) Nasopharyngeal Swab     Status: None   Collection Time: 07/04/21  9:10 AM   Specimen: Nasopharyngeal Swab; Nasopharyngeal(NP) swabs in vial transport medium  Result Value Ref Range Status   SARS Coronavirus 2 by RT PCR NEGATIVE NEGATIVE Final    Comment: (NOTE) SARS-CoV-2 target nucleic acids are NOT DETECTED.  The SARS-CoV-2 RNA is generally detectable in upper respiratory specimens during the acute phase of infection. The lowest concentration of SARS-CoV-2 viral copies this assay can detect is 138 copies/mL. A negative result does not preclude SARS-Cov-2 infection and should not be used as the sole basis for treatment or other patient management decisions. A negative result may occur with  improper specimen collection/handling, submission of specimen other than nasopharyngeal swab, presence of  viral mutation(s) within the areas targeted by this assay, and inadequate number of viral copies(<138 copies/mL). A negative result must be combined with clinical observations, patient history, and epidemiological information. The expected result is Negative.  Fact Sheet for Patients:  EntrepreneurPulse.com.au  Fact Sheet for Healthcare Providers:  IncredibleEmployment.be  This test is no t yet approved or cleared by the [place] FDA and  has been authorized for detection and/or diagnosis of SARS-CoV-2 by FDA under an Emergency Use Authorization (EUA). This EUA will remain  in effect (meaning this test can be used) for the duration of the COVID-19 declaration under Section 564(b)(1) of the Act, 21 U.S.C.section 360bbb-3(b)(1), unless the authorization is terminated  or revoked sooner.       Influenza A by PCR NEGATIVE NEGATIVE Final   Influenza B by PCR NEGATIVE NEGATIVE Final    Comment: (NOTE) The Xpert Xpress SARS-CoV-2/FLU/RSV plus assay is intended as an aid in the diagnosis of influenza from Nasopharyngeal swab specimens and should not be used as a sole basis for treatment. Nasal washings and aspirates are unacceptable for Xpert Xpress SARS-CoV-2/FLU/RSV testing.  Fact Sheet for Patients: EntrepreneurPulse.com.au  Fact Sheet for Healthcare Providers: IncredibleEmployment.be  This test is not yet approved or cleared by the [place] FDA and has been authorized for detection and/or diagnosis of SARS-CoV-2 by FDA under an Emergency Use Authorization (EUA). This EUA will remain in effect (meaning this test can be used) for the duration of the COVID-19 declaration under Section 564(b)(1) of the Act, 21 U.S.C. section 360bbb-3(b)(1), unless the authorization is terminated or revoked.  Performed at [facility], [address] [place], [place] [address]      Labs: BNP (last 3  results) No results for input(s): BNP in the last 8760 hours. Basic Metabolic Panel: Recent Labs  Lab 07/04/21 0900 07/04/21 1431 07/05/21 0328  NA 135  --  139  K 2.4* 2.7* 4.3  CL 103  --  113*  CO2 21*  --  25  GLUCOSE 163*  --  119*  BUN 8  --  7  CREATININE 0.82  --  0.78  CALCIUM 8.6*  --  8.7*  MG 2.0  --   --    Liver Function Tests: Recent Labs  Lab 07/04/21 0900  AST 18  ALT 14  ALKPHOS 84  BILITOT 0.3  PROT 6.7  ALBUMIN 3.7   No results for input(s): LIPASE, AMYLASE in the last 168 hours. No results for input(s): AMMONIA in the last 168 hours. CBC: Recent Labs  Lab 07/04/21 0900 07/05/21 0328  WBC 12.8* 17.7*  NEUTROABS 6.7  --   HGB 14.1 12.2  HCT 41.5 36.4  MCV 91.0 92.2  PLT 395 313   Cardiac Enzymes: No results for input(s): CKTOTAL, CKMB, CKMBINDEX, TROPONINI in the last 168 hours. BNP: Invalid input(s): POCBNP CBG: Recent Labs  Lab 07/04/21 1044  GLUCAP 118*   D-Dimer No results for input(s): DDIMER in the last 72 hours. Hgb A1c Recent Labs    07/04/21 0900  HGBA1C 5.3   Lipid Profile Recent Labs    07/04/21 0900  CHOL 276*  HDL 33*  LDLCALC NOT CALCULATED  TRIG 183*  CHOLHDL 8.4   Thyroid function studies No results for input(s): TSH, T4TOTAL, T3FREE, THYROIDAB in the last 72 hours.  Invalid input(s): FREET3 Anemia work up No results for input(s): VITAMINB12, FOLATE, FERRITIN, TIBC, IRON, RETICCTPCT in the last 72 hours. Urinalysis    Component Value Date/Time   COLORURINE COLORLESS (A) 03/21/2016 0937   APPEARANCEUR CLEAR (A) 03/21/2016 0937   LABSPEC 1.002 (L) 03/21/2016 0937   PHURINE 6.0 03/21/2016 0937   GLUCOSEU NEGATIVE 03/21/2016 0937   HGBUR NEGATIVE 03/21/2016 0937   BILIRUBINUR NEGATIVE 03/21/2016 0937   KETONESUR NEGATIVE 03/21/2016 0937   PROTEINUR NEGATIVE 03/21/2016 0937   NITRITE NEGATIVE 03/21/2016 0937   LEUKOCYTESUR NEGATIVE 03/21/2016 0937   Sepsis Labs Invalid input(s): PROCALCITONIN,   WBC,  LACTICIDVEN Microbiology Recent Results (from the past 240 hour(s))  Resp Panel by RT-PCR (Flu A&B, Covid) Nasopharyngeal Swab     Status: None   Collection Time: 07/04/21  9:10 AM   Specimen: Nasopharyngeal Swab; Nasopharyngeal(NP) swabs in vial transport medium  Result Value Ref Range Status   SARS Coronavirus 2 by RT PCR NEGATIVE NEGATIVE Final    Comment: (NOTE) SARS-CoV-2 target nucleic acids are NOT DETECTED.  The SARS-CoV-2 RNA is generally detectable in upper respiratory specimens during the acute phase of infection. The lowest concentration of SARS-CoV-2 viral copies this assay can detect is 138 copies/mL. A negative result does not preclude SARS-Cov-2 infection and should not be used as the sole basis for treatment or other patient management decisions. A negative result may occur with  improper specimen collection/handling, submission of specimen other than nasopharyngeal swab, presence of viral mutation(s) within the areas targeted by this assay, and inadequate number of viral copies(<138 copies/mL). A negative result must be combined with clinical observations, patient history, and epidemiological information. The expected result is Negative.  Fact Sheet for Patients:  EntrepreneurPulse.com.au  Fact Sheet for Healthcare Providers:  IncredibleEmployment.be  This test is no t yet approved or cleared by the [place] FDA and  has been authorized for detection and/or diagnosis of SARS-CoV-2 by FDA under an Emergency Use Authorization (EUA). This EUA will remain  in effect (meaning this test can be used) for the duration of the COVID-19 declaration under Section 564(b)(1) of the Act, 21 U.S.C.section 360bbb-3(b)(1), unless the authorization is terminated  or revoked sooner.       Influenza A by PCR NEGATIVE NEGATIVE Final   Influenza B by PCR NEGATIVE NEGATIVE Final    Comment: (NOTE) The Xpert Xpress SARS-CoV-2/FLU/RSV  plus assay is intended as an aid in the diagnosis of influenza from Nasopharyngeal swab specimens and should not be used as a sole basis for treatment. Nasal washings and aspirates are unacceptable for Xpert Xpress SARS-CoV-2/FLU/RSV testing.  Fact Sheet for Patients: EntrepreneurPulse.com.au  Fact Sheet for Healthcare Providers: IncredibleEmployment.be  This test is not yet approved or cleared by the [place] FDA and has been authorized for detection and/or diagnosis of SARS-CoV-2 by FDA under an Emergency Use Authorization (EUA). This EUA will remain in effect (meaning this test can be used) for the duration of the COVID-19 declaration under Section 564(b)(1) of the Act, 21 U.S.C. section 360bbb-3(b)(1), unless the authorization is terminated or revoked.  Performed at [facility], [address], [place], [place] [address]      Time coordinating discharge: Over 30 minutes  SIGNED:   Sidney Ace, MD  [facility] 07/05/2021, 2:19 PM Pager   If 7PM-7AM, please contact night-coverage

## 2021-07-07 DIAGNOSIS — I2111 ST elevation (STEMI) myocardial infarction involving right coronary artery: Secondary | ICD-10-CM | POA: Diagnosis not present

## 2021-07-07 DIAGNOSIS — I499 Cardiac arrhythmia, unspecified: Secondary | ICD-10-CM | POA: Diagnosis not present

## 2021-07-07 DIAGNOSIS — I2510 Atherosclerotic heart disease of native coronary artery without angina pectoris: Secondary | ICD-10-CM | POA: Diagnosis not present

## 2021-07-07 DIAGNOSIS — R0602 Shortness of breath: Secondary | ICD-10-CM | POA: Diagnosis not present

## 2021-07-07 DIAGNOSIS — Z955 Presence of coronary angioplasty implant and graft: Secondary | ICD-10-CM | POA: Diagnosis not present

## 2021-07-08 NOTE — Patient Outreach (Addendum)
Received a Hospital Discharge notification for Ms. Lisa Davila.  ?I have assigned Deloria Lair, RN to call for follow up and determine if there are any Case Management needs.  ?  ?Arville Care, CBCS, CMAA ?[place] Management Assistant ?[facility] ?[phone]  ?

## 2021-07-11 ENCOUNTER — Other Ambulatory Visit: Payer: Self-pay

## 2021-07-18 ENCOUNTER — Encounter: Payer: Self-pay | Admitting: *Deleted

## 2021-07-18 ENCOUNTER — Encounter: Payer: 59 | Attending: Internal Medicine | Admitting: *Deleted

## 2021-07-18 ENCOUNTER — Other Ambulatory Visit: Payer: Self-pay

## 2021-07-18 DIAGNOSIS — I213 ST elevation (STEMI) myocardial infarction of unspecified site: Secondary | ICD-10-CM | POA: Insufficient documentation

## 2021-07-18 DIAGNOSIS — Z955 Presence of coronary angioplasty implant and graft: Secondary | ICD-10-CM | POA: Insufficient documentation

## 2021-07-18 NOTE — Progress Notes (Signed)
Virtual orientation call completed today. shehas an appointment on Date: 07/27/2021  for EP eval and gym Orientation.  Documentation of diagnosis can be found in [facility] Date: 07/04/2021 .  ?Lisa Davila is a current tobacco user. Intervention for tobacco cessation was provided at the initial medical review. She was asked about readiness to quit and reported she is not ready until she is able to take care of several stressors in her life at the moment. She will be ready to quit after that occurs. She is down to 3 cigarettes a day from 40 a day. . Patient was advised and educated about tobacco cessation using combination therapy, tobacco cessation classes, quit line, and quit smoking apps. Patient demonstrated understanding of this material. Staff will continue to provide encouragement and follow up with the patient throughout the program.   ?

## 2021-07-21 DIAGNOSIS — I2111 ST elevation (STEMI) myocardial infarction involving right coronary artery: Secondary | ICD-10-CM | POA: Diagnosis not present

## 2021-07-21 DIAGNOSIS — R5383 Other fatigue: Secondary | ICD-10-CM | POA: Diagnosis not present

## 2021-07-21 DIAGNOSIS — Z955 Presence of coronary angioplasty implant and graft: Secondary | ICD-10-CM | POA: Diagnosis not present

## 2021-07-21 DIAGNOSIS — I2510 Atherosclerotic heart disease of native coronary artery without angina pectoris: Secondary | ICD-10-CM | POA: Diagnosis not present

## 2021-07-26 ENCOUNTER — Other Ambulatory Visit: Payer: Self-pay | Admitting: *Deleted

## 2021-07-26 DIAGNOSIS — I2510 Atherosclerotic heart disease of native coronary artery without angina pectoris: Secondary | ICD-10-CM | POA: Diagnosis not present

## 2021-07-26 DIAGNOSIS — Z955 Presence of coronary angioplasty implant and graft: Secondary | ICD-10-CM | POA: Diagnosis not present

## 2021-07-26 DIAGNOSIS — I252 Old myocardial infarction: Secondary | ICD-10-CM | POA: Diagnosis not present

## 2021-07-26 DIAGNOSIS — E876 Hypokalemia: Secondary | ICD-10-CM | POA: Diagnosis not present

## 2021-07-26 NOTE — Patient Outreach (Signed)
Initial outreach for possible [facility] Care Management services, [facility] referral. Pt recently had an ST elevation MI. I was able to talk with Lisa Davila today. She reports she is doing well but fatigued and very anxious. She is going to cardic rehab. Advised her to discuss this with the rehab staff. ? ?She reports she has already been involved with [facility]. ? ?I will send her a successful letter and a brochure for future reference. ?  ?[name]. Makinley Muscato, MSN, GNP-BC ?Gerontological Nurse Practitioner ?[facility] Care Management ?[phone] ? ?

## 2021-07-27 ENCOUNTER — Other Ambulatory Visit: Payer: Self-pay

## 2021-07-27 VITALS — Ht 62.5 in | Wt 166.0 lb

## 2021-07-27 DIAGNOSIS — I213 ST elevation (STEMI) myocardial infarction of unspecified site: Secondary | ICD-10-CM | POA: Diagnosis not present

## 2021-07-27 DIAGNOSIS — Z955 Presence of coronary angioplasty implant and graft: Secondary | ICD-10-CM

## 2021-07-27 NOTE — Progress Notes (Signed)
Cardiac Individual Treatment Plan ? ?Patient Details  ?Name: Lisa Davila ?MRN: [id number] ?Date of Birth: 1975/08/10 ?Referring Provider:   ?Flowsheet Row Cardiac Rehab from 07/27/2021 in [facility] Cardiac and Pulmonary Rehab  ?Referring Provider Lujean Amel MD  ? ?  ? ? ?Initial Encounter Date:  ?Flowsheet Row Cardiac Rehab from 07/27/2021 in [facility] Cardiac and Pulmonary Rehab  ?Date 07/27/21  ? ?  ? ? ?Visit Diagnosis: ST elevation myocardial infarction (STEMI), unspecified artery ([place]) ? ?Status post coronary artery stent placement ? ?Patient's Home Medications on Admission: ? ?Current Outpatient Medications:  ?  acetaminophen (TYLENOL) 500 MG tablet, Take by mouth. (Patient not taking: Reported on 07/18/2021), Disp: , Rfl:  ?  albuterol (VENTOLIN HFA) 108 (90 Base) MCG/ACT inhaler, Inhale 2 puffs into the lungs every 6 hours as needed for wheezing., Disp: 18 g, Rfl: 5 ?  ALPRAZolam (XANAX) 0.5 MG tablet, Take 0.5 mg by mouth at bedtime as needed for anxiety., Disp: , Rfl:  ?  aspirin 81 MG chewable tablet, Chew 1 tablet (81 mg total) by mouth daily., Disp: , Rfl:  ?  atorvastatin (LIPITOR) 80 MG tablet, Take 1 tablet (80 mg total) by mouth daily., Disp: 30 tablet, Rfl: 0 ?  clopidogrel (PLAVIX) 75 MG tablet, Take by mouth., Disp: , Rfl:  ?  cyanocobalamin 1000 MCG tablet, Take by mouth daily., Disp: , Rfl:  ?  metoprolol succinate (TOPROL-XL) 25 MG 24 hr tablet, Take 1 tablet (25 mg total) by mouth daily. Take with or immediately following a meal., Disp: 30 tablet, Rfl: 0 ?  nicotine (NICODERM CQ - DOSED IN MG/24 HOURS) 21 mg/24hr patch, Place 1 patch (21 mg total) onto the skin daily. (Patient not taking: Reported on 07/18/2021), Disp: 28 patch, Rfl: 0 ?  nitroGLYCERIN (NITROSTAT) 0.4 MG SL tablet, Place 1 tablet (0.4 mg total) under the tongue every 5 (five) minutes as needed for chest pain., Disp: 50 tablet, Rfl: 0 ? ?Past Medical History: ?Past Medical History:  ?Diagnosis Date  ? Acute ethmoidal sinusitis   ?  Acute ethmoidal sinusitis   ? Acute maxillary sinusitis   ? Acute upper respiratory infections of unspecified site   ? Anxiety state, unspecified   ? Chest pain, unspecified   ? Depression   ? Diarrhea   ? Esophageal reflux   ? Generalized anxiety disorder   ? Hyperlipidemia   ? Hypoglycemia, unspecified   ? Lumbago   ? Nausea with vomiting   ? Obstructive chronic bronchitis with exacerbation ([place])   ? Other and unspecified hyperlipidemia   ? Other and unspecified peripheral vertigo(386.19)   ? Other bursitis disorders   ? Other malaise and fatigue   ? Other specified diseases due to viruses   ? Other vitamin B12 deficiency anemia   ? Pain in limb   ? Pernicious anemia   ? Sleep related hypoventilation/hypoxemia in conditions classifiable elsewhere   ? Swelling, mass, or lump in head and neck   ? Tobacco use disorder   ? Unspecified disorder of external ear   ? Unspecified disorder of skin and subcutaneous tissue   ? Unspecified otitis media   ? ? ?Tobacco Use: ?Social History  ? ?Tobacco Use  ?Smoking Status Every Day  ? Packs/day: 2.00  ? Years: 30.00  ? Pack years: 60.00  ? Types: Cigarettes  ?Smokeless Tobacco Never  ?Tobacco Comments  ? Has been smoking for 30 years Wants to quit after she gets past all the heart stuff and changes  in life.  Smoking 3 a day right now  ? ? ?Labs: ?Review Flowsheet   ? ?  ?  Latest Ref Rng & Units 11/29/2011 07/04/2021  ?Labs for ITP Cardiac and Pulmonary Rehab  ?Cholestrol 0 - 200 mg/dL 280   276    ?LDL (calc) 0 - 99 mg/dL 214   NOT CALCULATED    ?HDL-C >40 mg/dL 29   33    ?Trlycerides <150 mg/dL 186   183    ?Hemoglobin A1c 4.8 - 5.6 %  5.3    ?  ? ? Multiple values from one day are sorted in reverse-chronological order  ?  ?  ? ? ? ?Exercise Target Goals: ?Exercise Program Goal: ?Individual exercise prescription set using results from initial 6 min walk test and THRR while considering  patient?s activity barriers and safety.  ? ?Exercise Prescription Goal: ?Initial exercise  prescription builds to 30-45 minutes a day of aerobic activity, 2-3 days per week.  Home exercise guidelines will be given to patient during program as part of exercise prescription that the participant will acknowledge. ? ? ?Education: Aerobic Exercise: ?- Group verbal and visual presentation on the components of exercise prescription. Introduces F.I.T.T principle from ACSM for exercise prescriptions.  Reviews F.I.T.T. principles of aerobic exercise including progression. Written material given at graduation. ? ? ?Education: Resistance Exercise: ?- Group verbal and visual presentation on the components of exercise prescription. Introduces F.I.T.T principle from ACSM for exercise prescriptions  Reviews F.I.T.T. principles of resistance exercise including progression. Written material given at graduation. ?Flowsheet Row Cardiac Rehab from 07/27/2021 in [facility] Cardiac and Pulmonary Rehab  ?Education need identified 07/27/21  ? ?  ? ?  ?Education: Exercise & Equipment Safety: ?- Individual verbal instruction and demonstration of equipment use and safety with use of the equipment. ?Flowsheet Row Cardiac Rehab from 07/27/2021 in [facility] Cardiac and Pulmonary Rehab  ?Education need identified 07/27/21  ?Date 07/27/21  ?Educator KL  ?Instruction Review Code 1- Verbalizes Understanding  ? ?  ? ? ?Education: Exercise Physiology & General Exercise Guidelines: ?- Group verbal and written instruction with models to review the exercise physiology of the cardiovascular system and associated critical values. Provides general exercise guidelines with specific guidelines to those with heart or lung disease.  ?Flowsheet Row Cardiac Rehab from 07/27/2021 in [facility] Cardiac and Pulmonary Rehab  ?Education need identified 07/27/21  ? ?  ? ? ?Education: Flexibility, Balance, Mind/Body Relaxation: ?- Group verbal and visual presentation with interactive activity on the components of exercise prescription. Introduces F.I.T.T principle from ACSM for  exercise prescriptions. Reviews F.I.T.T. principles of flexibility and balance exercise training including progression. Also discusses the mind body connection.  Reviews various relaxation techniques to help reduce and manage stress (i.e. Deep breathing, progressive muscle relaxation, and visualization). Balance handout provided to take home. Written material given at graduation. ? ? ?Activity Barriers & Risk Stratification: ? Activity Barriers & Cardiac Risk Stratification - 07/27/21 1300   ? ?  ? Activity Barriers & Cardiac Risk Stratification  ? Activity Barriers Deconditioning;Fibromyalgia   ? Cardiac Risk Stratification High   ? ?  ?  ? ?  ? ? ?6 Minute Walk: ? 6 Minute Walk   ? ? [place] Name 07/27/21 1233  ?  ?  ?  ? 6 Minute Walk  ? Phase Initial    ? Distance 1185 feet    ? Walk Time 6 minutes    ? # of Rest Breaks 0    ?  MPH 2.24    ? METS 3.8    ? RPE 9    ? Perceived Dyspnea  0    ? VO2 Peak 13.31    ? Symptoms No    ? Resting HR 98 bpm    ? Resting BP 104/64    ? Resting Oxygen Saturation  98 %    ? Exercise Oxygen Saturation  during 6 min walk 99 %    ? Max Ex. HR 111 bpm    ? Max Ex. BP 112/66    ? 2 Minute Post BP 100/64    ? ?  ?  ? ?  ? ? ?Oxygen Initial Assessment: ? ? ?Oxygen Re-Evaluation: ? ? ?Oxygen Discharge (Final Oxygen Re-Evaluation): ? ? ?Initial Exercise Prescription: ? Initial Exercise Prescription - 07/27/21 1300   ? ?  ? Date of Initial Exercise RX and Referring Provider  ? Date 07/27/21   ? Referring Provider Lujean Amel MD   ?  ? Oxygen  ? Maintain Oxygen Saturation 88% or higher   ?  ? Treadmill  ? MPH 2.3   ? Grade 1   ? Minutes 15   ? METs 3.08   ?  ? REL-XR  ? Level 2   ? Speed 50   ? Minutes 15   ? METs 3.8   ?  ? T5 Nustep  ? Level 2   ? SPM 80   ? Minutes 15   ? METs 3.8   ?  ? Prescription Details  ? Frequency (times per week) 3   ? Duration Progress to 30 minutes of continuous aerobic without signs/symptoms of physical distress   ?  ? Intensity  ? THRR 40-80% of Max  Heartrate 128 - 159   ? Ratings of Perceived Exertion 11-13   ? Perceived Dyspnea 0-4   ?  ? Progression  ? Progression Continue to progress workloads to maintain intensity without signs/symptoms of physical d

## 2021-07-27 NOTE — Patient Instructions (Signed)
Patient Instructions ? ?Patient Details  ?Name: Lisa Davila ?MRN: [id number] ?Date of Birth: November 04, 1975 ?Referring Provider:  Yolonda Kida, MD ? ?Below are your personal goals for exercise, nutrition, and risk factors. Our goal is to help you stay on track towards obtaining and maintaining these goals. We will be discussing your progress on these goals with you throughout the program. ? ?Initial Exercise Prescription: ? Initial Exercise Prescription - 07/27/21 1300   ? ?  ? Date of Initial Exercise RX and Referring Provider  ? Date 07/27/21   ? Referring Provider Lujean Amel MD   ?  ? Oxygen  ? Maintain Oxygen Saturation 88% or higher   ?  ? Treadmill  ? MPH 2.3   ? Grade 1   ? Minutes 15   ? METs 3.08   ?  ? REL-XR  ? Level 2   ? Speed 50   ? Minutes 15   ? METs 3.8   ?  ? T5 Nustep  ? Level 2   ? SPM 80   ? Minutes 15   ? METs 3.8   ?  ? Prescription Details  ? Frequency (times per week) 3   ? Duration Progress to 30 minutes of continuous aerobic without signs/symptoms of physical distress   ?  ? Intensity  ? THRR 40-80% of Max Heartrate 128 - 159   ? Ratings of Perceived Exertion 11-13   ? Perceived Dyspnea 0-4   ?  ? Progression  ? Progression Continue to progress workloads to maintain intensity without signs/symptoms of physical distress.   ?  ? Resistance Training  ? Training Prescription Yes   ? Weight 5 lb   ? Reps 10-15   ? ?  ?  ? ?  ? ? ?Exercise Goals: ?Frequency: Be able to perform aerobic exercise two to three times per week in program working toward 2-5 days per week of home exercise. ? ?Intensity: Work with a perceived exertion of 11 (fairly light) - 15 (hard) while following your exercise prescription.  We will make changes to your prescription with you as you progress through the program. ?  ?Duration: Be able to do 30 to 45 minutes of continuous aerobic exercise in addition to a 5 minute warm-up and a 5 minute cool-down routine. ?  ?Nutrition Goals: ?Your personal nutrition goals  will be established when you do your nutrition analysis with the dietician. ? ?The following are general nutrition guidelines to follow: ?Cholesterol < '200mg'$ /day ?Sodium < '1500mg'$ /day ?Fiber: Women under 50 yrs - 25 grams per day ? ?Personal Goals: ? Personal Goals and Risk Factors at Admission - 07/27/21 1305   ? ?  ? Core Components/Risk Factors/Patient Goals on Admission  ?  Weight Management Yes;Weight Loss   ? Intervention Weight Management: Develop a combined nutrition and exercise program designed to reach desired caloric intake, while maintaining appropriate intake of nutrient and fiber, sodium and fats, and appropriate energy expenditure required for the weight goal.;Weight Management: Provide education and appropriate resources to help participant work on and attain dietary goals.;Weight Management/Obesity: Establish reasonable short term and long term weight goals.   ? Admit Weight 166 lb (75.3 kg)   ? Goal Weight: Short Term 161 lb (73 kg)   ? Goal Weight: Long Term 120 lb (54.4 kg)   ? Expected Outcomes Short Term: Continue to assess and modify interventions until short term weight is achieved;Long Term: Adherence to nutrition and physical activity/exercise program aimed toward attainment  of established weight goal;Weight Loss: Understanding of general recommendations for a balanced deficit meal plan, which promotes 1-2 lb weight loss per week and includes a negative energy balance of [phone] kcal/d;Understanding recommendations for meals to include 15-35% energy as protein, 25-35% energy from fat, 35-60% energy from carbohydrates, less than '200mg'$  of dietary cholesterol, 20-35 gm of total fiber daily;Understanding of distribution of calorie intake throughout the day with the consumption of 4-5 meals/snacks   ? Tobacco Cessation Yes   ? Number of packs per day Derrisha is a current tobacco user. Intervention for tobacco cessation was provided at the initial medical review. She was asked about readiness to  quit and reported she is not ready until she is able to take care of several stressors in her life at the moment. She will be ready to quit after that occurs. She is down to 3 cigarettes a day from 40 a day. . Patient was advised and educated about tobacco cessation using combination therapy, tobacco cessation classes, quit line, and quit smoking apps. Patient demonstrated understanding of this material. Staff will continue to provide encouragement and follow up with the patient throughout the program.   ? Intervention Assist the participant in steps to quit. Provide individualized education and counseling about committing to Tobacco Cessation, relapse prevention, and pharmacological support that can be provided by physician.;Advice worker, assist with locating and accessing local/national Quit Smoking programs, and support quit date choice.   ? Expected Outcomes Short Term: Will demonstrate readiness to quit, by selecting a quit date.;Short Term: Will quit all tobacco product use, adhering to prevention of relapse plan.;Long Term: Complete abstinence from all tobacco products for at least 12 months from quit date.   ? Hypertension Yes   ? Intervention Provide education on lifestyle modifcations including regular physical activity/exercise, weight management, moderate sodium restriction and increased consumption of fresh fruit, vegetables, and low fat dairy, alcohol moderation, and smoking cessation.;Monitor prescription use compliance.   ? Expected Outcomes Short Term: Continued assessment and intervention until BP is < 140/16m HG in hypertensive participants. < 130/834mHG in hypertensive participants with diabetes, heart failure or chronic kidney disease.;Long Term: Maintenance of blood pressure at goal levels.   ? Lipids Yes   ? Intervention Provide education and support for participant on nutrition & aerobic/resistive exercise along with prescribed medications to achieve LDL '70mg'$ , HDL >'40mg'$ .    ? Expected Outcomes Short Term: Participant states understanding of desired cholesterol values and is compliant with medications prescribed. Participant is following exercise prescription and nutrition guidelines.;Long Term: Cholesterol controlled with medications as prescribed, with individualized exercise RX and with personalized nutrition plan. Value goals: LDL < '70mg'$ , HDL > 40 mg.   ? Stress Yes   ? Intervention Offer individual and/or small group education and counseling on adjustment to heart disease, stress management and health-related lifestyle change. Teach and support self-help strategies.;Refer participants experiencing significant psychosocial distress to appropriate mental health specialists for further evaluation and treatment. When possible, include family members and significant others in education/counseling sessions.   ? Expected Outcomes Short Term: Participant demonstrates changes in health-related behavior, relaxation and other stress management skills, ability to obtain effective social support, and compliance with psychotropic medications if prescribed.;Long Term: Emotional wellbeing is indicated by absence of clinically significant psychosocial distress or social isolation.   ? ?  ?  ? ?  ? ? ?Tobacco Use Initial Evaluation: ?Social History  ? ?Tobacco Use  ?Smoking Status Every Day  ? Packs/day: 2.00  ? Years:  30.00  ? Pack years: 60.00  ? Types: Cigarettes  ?Smokeless Tobacco Never  ?Tobacco Comments  ? Has been smoking for 30 years Wants to quit after she gets past all the heart stuff and changes in life.  Smoking 3 a day right now  ? ? ?Exercise Goals and Review: ? Exercise Goals   ? ? [place] Name 07/27/21 1304  ?  ?  ?  ?  ?  ? Exercise Goals  ? Increase Physical Activity Yes      ? Intervention Provide advice, education, support and counseling about physical activity/exercise needs.;Develop an individualized exercise prescription for aerobic and resistive training based on initial  evaluation findings, risk stratification, comorbidities and participant's personal goals.      ? Expected Outcomes Short Term: Attend rehab on a regular basis to increase amount of physical activity.;[place]

## 2021-07-29 DIAGNOSIS — J029 Acute pharyngitis, unspecified: Secondary | ICD-10-CM | POA: Diagnosis not present

## 2021-07-29 DIAGNOSIS — J069 Acute upper respiratory infection, unspecified: Secondary | ICD-10-CM | POA: Diagnosis not present

## 2021-07-29 DIAGNOSIS — R062 Wheezing: Secondary | ICD-10-CM | POA: Diagnosis not present

## 2021-07-29 DIAGNOSIS — H938X3 Other specified disorders of ear, bilateral: Secondary | ICD-10-CM | POA: Diagnosis not present

## 2021-07-29 DIAGNOSIS — R0981 Nasal congestion: Secondary | ICD-10-CM | POA: Diagnosis not present

## 2021-07-31 ENCOUNTER — Other Ambulatory Visit: Payer: Self-pay

## 2021-07-31 ENCOUNTER — Emergency Department
Admission: EM | Admit: 2021-07-31 | Discharge: 2021-07-31 | Disposition: A | Discharge status: Home / Self Care | Payer: 59 | Attending: Emergency Medicine | Admitting: Emergency Medicine

## 2021-07-31 ENCOUNTER — Emergency Department: Payer: 59

## 2021-07-31 DIAGNOSIS — R0789 Other chest pain: Secondary | ICD-10-CM | POA: Insufficient documentation

## 2021-07-31 DIAGNOSIS — E876 Hypokalemia: Secondary | ICD-10-CM | POA: Diagnosis not present

## 2021-07-31 DIAGNOSIS — I10 Essential (primary) hypertension: Secondary | ICD-10-CM | POA: Diagnosis not present

## 2021-07-31 DIAGNOSIS — Z5321 Procedure and treatment not carried out due to patient leaving prior to being seen by health care provider: Secondary | ICD-10-CM | POA: Insufficient documentation

## 2021-07-31 DIAGNOSIS — R072 Precordial pain: Secondary | ICD-10-CM | POA: Diagnosis not present

## 2021-07-31 DIAGNOSIS — R000 Tachycardia, unspecified: Secondary | ICD-10-CM | POA: Diagnosis not present

## 2021-07-31 LAB — BASIC METABOLIC PANEL
Anion gap: 10 (ref 5–15)
BUN: 6 mg/dL (ref 6–20)
CO2: 29 mmol/L (ref 22–32)
Calcium: 8.9 mg/dL (ref 8.9–10.3)
Chloride: 102 mmol/L (ref 98–111)
Creatinine, Ser: 0.8 mg/dL (ref 0.44–1.00)
GFR, Estimated: >60 mL/min (ref >60)
Glucose, Bld: 109 mg/dL — ABNORMAL HIGH (ref 70–99)
Potassium: 2.4 mmol/L — CL (ref 3.5–5.1)
Sodium: 141 mmol/L (ref 135–145)

## 2021-07-31 LAB — CBC
HCT: 39.9 % (ref 36.0–46.0)
Hemoglobin: 13.5 g/dL (ref 12.0–15.0)
MCH: 31 pg (ref 26.0–34.0)
MCHC: 33.8 g/dL (ref 30.0–36.0)
MCV: 91.5 fL (ref 80.0–100.0)
Platelets: 376 10*3/uL (ref 150–400)
RBC: 4.36 MIL/uL (ref 3.87–5.11)
RDW: 13 % (ref 11.5–15.5)
WBC: 7.9 10*3/uL (ref 4.0–10.5)
nRBC: 0 % (ref 0.0–0.2)

## 2021-07-31 LAB — PROTIME-INR
INR: 1 (ref 0.8–1.2)
Prothrombin Time: 13.1 seconds (ref 11.4–15.2)

## 2021-07-31 LAB — POC URINE PREG, ED: Preg Test, Ur: NEGATIVE

## 2021-07-31 LAB — TROPONIN I (HIGH SENSITIVITY): Troponin I (High Sensitivity): 17 ng/L (ref <18)

## 2021-07-31 MED ORDER — POTASSIUM CHLORIDE CRYS ER 20 MEQ PO TBCR
40.0000 meq | EXTENDED_RELEASE_TABLET | Freq: Once | ORAL | Status: AC
  Administered 2021-07-31: 40 meq via ORAL
  Filled 2021-07-31 (×2): qty 2

## 2021-07-31 MED ORDER — SODIUM CHLORIDE 0.9 % IV BOLUS
1000.0000 mL | Freq: Once | INTRAVENOUS | Status: AC
  Administered 2021-07-31: 1000 mL via INTRAVENOUS

## 2021-07-31 MED ORDER — POTASSIUM CHLORIDE 10 MEQ/100ML IV SOLN
10.0000 meq | INTRAVENOUS | Status: DC
  Administered 2021-07-31: 10 meq via INTRAVENOUS
  Filled 2021-07-31: qty 100

## 2021-07-31 NOTE — ED Notes (Signed)
K+ pills crushed and put in apple sauce per pt request. ?

## 2021-07-31 NOTE — ED Notes (Signed)
Pt states at this time that she does not wish to have more blood drawn and that she would like to leave. MD made aware and pt will be given an AMA form at this time.  ?

## 2021-07-31 NOTE — ED Triage Notes (Signed)
Pt ambulatory to triage. Midsternal chest pain. Pt states she had MI 2 weeks ago with stent placement, and again had chest pain on Friday for which she called EMS for. Denies N/V or SOB.  ?

## 2021-07-31 NOTE — ED Notes (Signed)
Pt NAD in bed, a/ox4. Pt states she has been having CP and anxiety. Pt states MI 2 weeks ago and has had low K+. Pt admits to not taking her K+ pills because she didn't feel good and theyre big pills.  ?

## 2021-07-31 NOTE — ED Provider Notes (Signed)
? ?[facility] ?Provider Note ? ? ? Event Date/Time  ? First MD Initiated Contact with Patient 07/31/21 2115   ?  (approximate) ? ?History  ? ?Chief Complaint: Chest Pain ? ?HPI ? ?Lisa Davila is a 46 y.o. female with a past medical history of recent MI status post stent 3 weeks ago who presents emergency department for "not feeling right."  According to the patient 2-1/2 weeks ago she had a myocardial infarction with a stent placed.  Patient follows up with Dr. Clayborn Bigness.  Patient states she was having some slight chest discomfort although denies any "pain."  Patient states she did have chest pain 3 days ago when she called EMS at that time.  Patient states she was concerned regarding her heart so she came to the emergency department. ? ?Physical Exam  ? ?Triage Vital Signs: ?ED Triage Vitals  ?Enc Vitals Group  ?   BP 07/31/21 2007 122/82  ?   Pulse Rate 07/31/21 2007 (!) 110  ?   Resp 07/31/21 2007 18  ?   Temp 07/31/21 2007 98.3 ?F (36.8 ?C)  ?   Temp Source 07/31/21 2007 Oral  ?   SpO2 07/31/21 2007 98 %  ?   Weight 07/31/21 2000 165 lb (74.8 kg)  ?   Height 07/31/21 2000 '5\' 3"'$  (1.6 m)  ?   Head Circumference --   ?   Peak Flow --   ?   Pain Score 07/31/21 2000 8  ?   Pain Loc --   ?   Pain Edu? --   ?   Excl. in [place]? --   ? ? ?Most recent vital signs: ?Vitals:  ? 07/31/21 2007  ?BP: 122/82  ?Pulse: (!) 110  ?Resp: 18  ?Temp: 98.3 ?F (36.8 ?C)  ?SpO2: 98%  ? ? ?General: Awake, no distress.  ?CV:  Good peripheral perfusion.  Regular rate and rhythm  ?Resp:  Normal effort.  Equal breath sounds bilaterally.  ?Abd:  No distention.  Soft, nontender.  No rebound or guarding. ? ? ? ?ED Results / Procedures / Treatments  ? ?EKG ? ?EKG viewed and interpreted by myself shows sinus tachycardia 109 bpm with a narrow QRS, left axis deviation, largely normal intervals with nonspecific but no concerning ST changes. ? ?RADIOLOGY ? ?I personally reviewed the chest x-ray images, no significant  abnormality seen on my evaluation. ?Radiology is read the chest x-ray is negative ? ? ?MEDICATIONS ORDERED IN ED: ?Medications  ?potassium chloride SA (KLOR-CON M) CR tablet 40 mEq (has no administration in time range)  ?potassium chloride 10 mEq in 100 mL IVPB (has no administration in time range)  ?sodium chloride 0.9 % bolus 1,000 mL (has no administration in time range)  ? ? ? ?IMPRESSION / MDM / ASSESSMENT AND PLAN / ED COURSE  ?I reviewed the triage vital signs and the nursing notes. ? ?Patient presents emergency department for chest tightness.  States she had symptoms on Friday and then again today so she came to the emergency department as she was concerned given her recent myocardial infarction with stent placed.  Patient's work-up today is overall reassuring from a cardiac standpoint troponin is negative.  CBC is normal.  INR is normal.  Patient's chemistry however does show a very low potassium of 2.4.  Patient had a similar potassium 2 weeks ago.  Patient states she is prescribed potassium supplements but admits she has not been taking them at home recently.  Is  supposed to be taking 20 mill equivalents twice daily.  We will dose 40 mEq orally and 10 mill equivalents IV.  I discussed my concern given her hypokalemia, patient states she does not want to be admitted to the hospital.  We will recheck a chemistry after IV and oral replacement.  Patient agreeable to plan. ? ?Repeat BMP is pending.  Patient care signed out to oncoming provider. ? ?FINAL CLINICAL IMPRESSION(S) / ED DIAGNOSES  ? ?Hypokalemia ? ?Note:  This document was prepared using Dragon voice recognition software and may include unintentional dictation errors. ?  Harvest Dark, MD ?07/31/21 2310 ? ?

## 2021-08-08 ENCOUNTER — Other Ambulatory Visit: Payer: Self-pay

## 2021-08-08 ENCOUNTER — Other Ambulatory Visit (HOSPITAL_COMMUNITY): Payer: Self-pay

## 2021-08-08 ENCOUNTER — Encounter: Payer: 59 | Attending: Internal Medicine

## 2021-08-08 DIAGNOSIS — I213 ST elevation (STEMI) myocardial infarction of unspecified site: Secondary | ICD-10-CM | POA: Insufficient documentation

## 2021-08-08 DIAGNOSIS — Z955 Presence of coronary angioplasty implant and graft: Secondary | ICD-10-CM | POA: Insufficient documentation

## 2021-08-08 MED ORDER — DULOXETINE HCL 20 MG PO CPEP
20.0000 mg | ORAL_CAPSULE | Freq: Every day | ORAL | 1 refills | Status: DC
  Filled 2021-08-08: qty 90, 90d supply, fill #0

## 2021-08-08 MED ORDER — CLOPIDOGREL BISULFATE 75 MG PO TABS
75.0000 mg | ORAL_TABLET | Freq: Every day | ORAL | 10 refills | Status: DC
  Filled 2021-08-08: qty 30, 30d supply, fill #0
  Filled 2021-09-01: qty 30, 30d supply, fill #1
  Filled 2021-10-14: qty 30, 30d supply, fill #2
  Filled 2021-11-14: qty 90, 90d supply, fill #3

## 2021-08-09 ENCOUNTER — Other Ambulatory Visit: Payer: Self-pay

## 2021-08-09 MED ORDER — IPRATROPIUM-ALBUTEROL 0.5-2.5 (3) MG/3ML IN SOLN
RESPIRATORY_TRACT | 1 refills | Status: AC
  Filled 2021-08-09: qty 360, 30d supply, fill #0
  Filled 2021-09-20: qty 360, 28d supply, fill #0
  Filled 2021-11-14: qty 90, 8d supply, fill #1
  Filled 2022-02-15: qty 90, 8d supply, fill #2
  Filled 2022-03-10 – 2022-06-12 (×2): qty 90, 8d supply, fill #3

## 2021-08-10 ENCOUNTER — Other Ambulatory Visit: Payer: Self-pay

## 2021-08-16 ENCOUNTER — Other Ambulatory Visit
Admission: RE | Admit: 2021-08-16 | Discharge: 2021-08-16 | Disposition: A | Discharge status: Home / Self Care | Payer: 59 | Source: Ambulatory Visit | Attending: Student | Admitting: Student

## 2021-08-16 DIAGNOSIS — R000 Tachycardia, unspecified: Secondary | ICD-10-CM | POA: Diagnosis not present

## 2021-08-16 DIAGNOSIS — I2510 Atherosclerotic heart disease of native coronary artery without angina pectoris: Secondary | ICD-10-CM | POA: Diagnosis not present

## 2021-08-16 DIAGNOSIS — I499 Cardiac arrhythmia, unspecified: Secondary | ICD-10-CM | POA: Diagnosis present

## 2021-08-16 DIAGNOSIS — Z955 Presence of coronary angioplasty implant and graft: Secondary | ICD-10-CM | POA: Diagnosis not present

## 2021-08-16 DIAGNOSIS — E876 Hypokalemia: Secondary | ICD-10-CM | POA: Diagnosis not present

## 2021-08-16 DIAGNOSIS — I493 Ventricular premature depolarization: Secondary | ICD-10-CM | POA: Diagnosis not present

## 2021-08-16 DIAGNOSIS — I2111 ST elevation (STEMI) myocardial infarction involving right coronary artery: Secondary | ICD-10-CM | POA: Diagnosis not present

## 2021-08-16 DIAGNOSIS — R0602 Shortness of breath: Secondary | ICD-10-CM | POA: Diagnosis not present

## 2021-08-16 DIAGNOSIS — R002 Palpitations: Secondary | ICD-10-CM | POA: Diagnosis not present

## 2021-08-16 DIAGNOSIS — R5383 Other fatigue: Secondary | ICD-10-CM | POA: Diagnosis not present

## 2021-08-16 LAB — BRAIN NATRIURETIC PEPTIDE: B Natriuretic Peptide: 72 pg/mL (ref 0.0–100.0)

## 2021-08-18 ENCOUNTER — Emergency Department: Payer: 59

## 2021-08-18 ENCOUNTER — Emergency Department
Admission: EM | Admit: 2021-08-18 | Discharge: 2021-08-18 | Disposition: A | Discharge status: Home / Self Care | Payer: 59 | Attending: Emergency Medicine | Admitting: Emergency Medicine

## 2021-08-18 ENCOUNTER — Other Ambulatory Visit: Payer: Self-pay

## 2021-08-18 DIAGNOSIS — R002 Palpitations: Secondary | ICD-10-CM | POA: Insufficient documentation

## 2021-08-18 DIAGNOSIS — R0789 Other chest pain: Secondary | ICD-10-CM | POA: Diagnosis not present

## 2021-08-18 DIAGNOSIS — F17200 Nicotine dependence, unspecified, uncomplicated: Secondary | ICD-10-CM | POA: Diagnosis not present

## 2021-08-18 DIAGNOSIS — I2510 Atherosclerotic heart disease of native coronary artery without angina pectoris: Secondary | ICD-10-CM | POA: Insufficient documentation

## 2021-08-18 DIAGNOSIS — E876 Hypokalemia: Secondary | ICD-10-CM | POA: Insufficient documentation

## 2021-08-18 DIAGNOSIS — R079 Chest pain, unspecified: Secondary | ICD-10-CM | POA: Diagnosis not present

## 2021-08-18 LAB — CBC
HCT: 42.2 % (ref 36.0–46.0)
Hemoglobin: 14.2 g/dL (ref 12.0–15.0)
MCH: 31.5 pg (ref 26.0–34.0)
MCHC: 33.6 g/dL (ref 30.0–36.0)
MCV: 93.6 fL (ref 80.0–100.0)
Platelets: 372 10*3/uL (ref 150–400)
RBC: 4.51 MIL/uL (ref 3.87–5.11)
RDW: 13.2 % (ref 11.5–15.5)
WBC: 8.1 10*3/uL (ref 4.0–10.5)
nRBC: 0 % (ref 0.0–0.2)

## 2021-08-18 LAB — BASIC METABOLIC PANEL
Anion gap: 7 (ref 5–15)
BUN: <5 mg/dL — ABNORMAL LOW (ref 6–20)
CO2: 26 mmol/L (ref 22–32)
Calcium: 8.7 mg/dL — ABNORMAL LOW (ref 8.9–10.3)
Chloride: 104 mmol/L (ref 98–111)
Creatinine, Ser: 0.66 mg/dL (ref 0.44–1.00)
GFR, Estimated: >60 mL/min (ref >60)
Glucose, Bld: 120 mg/dL — ABNORMAL HIGH (ref 70–99)
Potassium: 2.9 mmol/L — ABNORMAL LOW (ref 3.5–5.1)
Sodium: 137 mmol/L (ref 135–145)

## 2021-08-18 LAB — TROPONIN I (HIGH SENSITIVITY): Troponin I (High Sensitivity): 9 ng/L (ref <18)

## 2021-08-18 MED ORDER — POTASSIUM CHLORIDE 10 MEQ/100ML IV SOLN
10.0000 meq | INTRAVENOUS | Status: DC
  Administered 2021-08-18: 10 meq via INTRAVENOUS
  Filled 2021-08-18: qty 100

## 2021-08-18 MED ORDER — POTASSIUM CHLORIDE CRYS ER 20 MEQ PO TBCR
40.0000 meq | EXTENDED_RELEASE_TABLET | Freq: Once | ORAL | Status: AC
  Administered 2021-08-18: 40 meq via ORAL
  Filled 2021-08-18: qty 2

## 2021-08-18 NOTE — ED Provider Notes (Signed)
? ?[facility] ?Provider Note ? ? ? Event Date/Time  ? First MD Initiated Contact with Patient 08/18/21 1024   ?  (approximate) ? ? ?History  ? ?Chest Pain ? ? ?HPI ? ?Lisa Davila is a 46 y.o. female who presents to the ED for evaluation of Chest Pain ?  ?I reviewed outpatient cardiology follow-up from 2 days ago.  History of CAD with a STEMI on 2/27 requiring PCI to proximal RCA.  Also history of SVT with palpitations more remotely than this.  Otherwise tobacco abuse, fibromyalgia, anxiety, depression, HLD and GERD.  Currently has a 7-day Holter monitor in place. ? ?Patient presents to the ED for the ration of acute on chronic chest pain and palpitations.  She reports that since her STEMI nearly 2 months ago that she has had chest pressure all day every day, as well as recurrent intermittent palpitations.  She reports recurrence of the symptoms today with palpitations and chronic chest pressure. ? ?Denies any fever, syncopal episodes, shortness of breath that is worsening, increased cough ? ?Physical Exam  ? ?Triage Vital Signs: ?ED Triage Vitals  ?Enc Vitals Group  ?   BP 08/18/21 1027 107/83  ?   Pulse Rate 08/18/21 1027 97  ?   Resp 08/18/21 1027 18  ?   Temp --   ?   Temp Source 08/18/21 1027 Oral  ?   SpO2 08/18/21 1027 97 %  ?   Weight 08/18/21 1028 170 lb (77.1 kg)  ?   Height 08/18/21 1028 '5\' 3"'$  (1.6 m)  ?   Head Circumference --   ?   Peak Flow --   ?   Pain Score 08/18/21 1027 3  ?   Pain Loc --   ?   Pain Edu? --   ?   Excl. in [place]? --   ? ? ?Most recent vital signs: ?Vitals:  ? 08/18/21 1027 08/18/21 1100  ?BP: 107/83 93/71  ?Pulse: 97 94  ?Resp: 18 (!) 21  ?SpO2: 97% 97%  ? ? ?General: Awake, no distress.  Seems anxious. ?CV:  Good peripheral perfusion. Frequent PVCs on the monitor.  ?Resp:  Normal effort.  ?Abd:  No distention.  ?MSK:  No deformity noted.  ?Neuro:  No focal deficits appreciated. Cranial nerves II through XII intact ?5/5 strength and sensation in all 4  extremities ?Other:   ? ? ?ED Results / Procedures / Treatments  ? ?Labs ?(all labs ordered are listed, but only abnormal results are displayed) ?Labs Reviewed  ?BASIC METABOLIC PANEL - Abnormal; Notable for the following components:  ?    Result Value  ? Potassium 2.9 (*)   ? Glucose, Bld 120 (*)   ? BUN <5 (*)   ? Calcium 8.7 (*)   ? All other components within normal limits  ?CBC  ?POC URINE PREG, ED  ?TROPONIN I (HIGH SENSITIVITY)  ?TROPONIN I (HIGH SENSITIVITY)  ? ? ?EKG ?Sinus rhythm, rate of 104 bpm.  Normal axis.  Incomplete right bundle.  Few PVCs.  No STEMI.  Lateral, septal and inferior nonspecific ST changes and T wave inversions.  Fairly similar to EKG from 3/26. ? ?RADIOLOGY ?CXR reviewed by me without evidence of acute cardiopulmonary pathology. ? ?Official radiology report(s): ?DG Chest 2 View ? 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 Pt states she was initially at 6/10 EXAM: CHEST - 2 VIEW COMPARISON:  none FINDINGS: Lungs are clear. Electronic monitor projects over the mid left chest. Heart size and mediastinal contours are within normal limits. No effusion. Visualized bones unremarkable. IMPRESSION: No acute cardiopulmonary disease. Electronically Signed   By: Lucrezia Europe M.D.   On: 08/18/2021 11:05   ? ?PROCEDURES and INTERVENTIONS: ? ?.1-3 Lead EKG Interpretation ?Performed by: Vladimir Crofts, MD ?Authorized by: Vladimir Crofts, MD  ? ?  Interpretation: abnormal   ?  ECG rate:  99 ?  ECG rate assessment: normal   ?  Rhythm: sinus rhythm   ?  Ectopy: PVCs   ?  Conduction: normal   ? ?Medications  ?potassium chloride 10 mEq in 100 mL IVPB (10 mEq Intravenous New Bag/Given 08/18/21 1147)  ?potassium chloride SA (KLOR-CON M) CR tablet 40 mEq (40 mEq Oral Given 08/18/21 1143)  ? ? ? ?IMPRESSION / MDM / ASSESSMENT AND PLAN / ED COURSE  ?I reviewed the  triage vital signs and the nursing notes. ? ?46 year old woman with coronary disease presents to the ED with acute on chronic chest pressure and palpitations, likely due to hypokalemia.  She look systemically well, but is anxious.  EKG is nonischemic and has frequent PVCs.  Similarly frequent PVCs on the monitor.  Blood work shows hypokalemia that was repleted orally and IV.  CBC is normal and troponin is negative.  CXR is clear.  Improving symptoms after repletion of potassium and I urged the patient to stay for second troponin to evaluate for more subtle cardiac pathology such as an NSTEMI, but she declines as below.  We discussed return precautions and following up with cardiology. ? ?Clinical Course as of 08/18/21 1303  ?Thu Aug 18, 2021  ?1259 Patient repeatedly calling out and demanding to leave.  She understands that I recommend staying for second troponin, she acknowledges this and says that she does not really think is necessary and rather just go home.  We discussed the possibility of undiagnosed cardiac pathology such as an NSTEMI, she acknowledges this.  Expressing understanding and agreement.  She says that she needs to leave and that this is stupid. [DS]  ?  ?Clinical Course User Index ?[DS] Vladimir Crofts, MD  ? ? ? ?FINAL CLINICAL IMPRESSION(S) / ED DIAGNOSES  ? ?Final diagnoses:  ?Palpitations  ?Other chest pain  ? ? ? ?Rx / DC Orders  ? ?ED Discharge Orders   ? ? None  ? ?  ? ? ? ?Note:  This document was prepared using Dragon voice recognition software and may include unintentional dictation errors. ?  ?Vladimir Crofts, MD ?08/18/21 1304 ? ?

## 2021-08-18 NOTE — ED Notes (Addendum)
After blood work came back pt was educated on the need for K+ replacement due to a critically low K+. Pt initially refused any K+ IV or oral, pt was educated on need for this and MD did talk pt into staying for repeat trop and meds.  ? ?After 1st bag of K+ pt refused anymore IV K+, pt did also take oral K+ in applesauce to help due to size of the pill.  ? ?Pt also mentioned to this RN that she has been crushing her oral K+ and this RN educated pt that is one of the pills that CANNOT be crushed and pt states "well they did it here and the ICU", pt made aware. ? ?MD made aware of pt wanting to leave after 1st bag of K+and pt also refused a second trop blood draw.  ? ?Pt to sign AMA form. Pt refuses last set of vitals.  ?

## 2021-08-18 NOTE — Progress Notes (Addendum)
Was notified by 2A charge RN patient was at work and having chest pain unrelieved by 1 nitro. Arrived to take patient to ED. Alert and oriented, no resp. Distress, still having chest pain. Patient informed me she suffered a heart attack in July 04 2021. Had also had to visit the ED recently for Chest Pain. Was concerned having another heart attack. Patient taken to triage. Was immediately taken back to be triaged. ?

## 2021-08-18 NOTE — ED Notes (Signed)
Pt at XRAY

## 2021-08-18 NOTE — ED Notes (Signed)
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 Pt states she was initially at 6/10 prior to nitro, pt states her pain is now a 3/10. Pt denies any N/V. Pt is A&Ox4. Pt states recent stents and MI in feb and is concerned it is her heart again. Pt states she has been taking her K+. ? ? Pt also reports "I hope this isn't just my anxiety". Pt reassured with current heart issues it was better safe than sorry. Pt is NAD at this time. VSS.  ?

## 2021-08-18 NOTE — ED Triage Notes (Signed)
Pt c/o non radiating  left sided chest pain that started about 34mn ago  with a hx of MI in February.  ?

## 2021-08-19 ENCOUNTER — Other Ambulatory Visit: Payer: Self-pay

## 2021-08-19 DIAGNOSIS — E876 Hypokalemia: Secondary | ICD-10-CM | POA: Diagnosis not present

## 2021-08-19 MED ORDER — POTASSIUM CHLORIDE ER 10 MEQ PO TBCR
EXTENDED_RELEASE_TABLET | ORAL | 0 refills | Status: DC
  Filled 2021-08-19: qty 240, 30d supply, fill #0
  Filled 2022-01-10: qty 60, 7d supply, fill #1

## 2021-08-23 NOTE — Progress Notes (Signed)
Patient has not attended since orientation. ?

## 2021-08-24 ENCOUNTER — Encounter: Payer: Self-pay | Admitting: *Deleted

## 2021-08-24 DIAGNOSIS — I213 ST elevation (STEMI) myocardial infarction of unspecified site: Secondary | ICD-10-CM

## 2021-08-24 DIAGNOSIS — Z955 Presence of coronary angioplasty implant and graft: Secondary | ICD-10-CM

## 2021-08-24 NOTE — Progress Notes (Signed)
Cardiac Individual Treatment Plan ? ?Patient Details  ?Name: Lisa Davila ?MRN: [id number] ?Date of Birth: February 16, 1976 ?Referring Provider:   ?Flowsheet Row Cardiac Rehab from 07/27/2021 in [facility] Cardiac and Pulmonary Rehab  ?Referring Provider Lujean Amel MD  ? ?  ? ? ?Initial Encounter Date:  ?Flowsheet Row Cardiac Rehab from 07/27/2021 in [facility] Cardiac and Pulmonary Rehab  ?Date 07/27/21  ? ?  ? ? ?Visit Diagnosis: ST elevation myocardial infarction (STEMI), unspecified artery ([place]) ? ?Status post coronary artery stent placement ? ?Patient's Home Medications on Admission: ? ?Current Outpatient Medications:  ?  acetaminophen (TYLENOL) 500 MG tablet, Take by mouth. (Patient not taking: Reported on 07/18/2021), Disp: , Rfl:  ?  albuterol (PROVENTIL) (2.5 MG/3ML) 0.083% nebulizer solution, Take 2.5 mg by nebulization every 4 (four) hours as needed., Disp: , Rfl:  ?  albuterol (VENTOLIN HFA) 108 (90 Base) MCG/ACT inhaler, Inhale 2 puffs into the lungs every 6 hours as needed for wheezing., Disp: 18 g, Rfl: 5 ?  ALPRAZolam (XANAX) 1 MG tablet, Take 0.5-1 mg by mouth 2 (two) times daily as needed., Disp: , Rfl:  ?  amoxicillin-clavulanate (AUGMENTIN) 875-125 MG tablet, Take 1 tablet by mouth 2 (two) times daily., Disp: , Rfl:  ?  aspirin 81 MG chewable tablet, Chew 1 tablet (81 mg total) by mouth daily., Disp: , Rfl:  ?  atorvastatin (LIPITOR) 80 MG tablet, Take 1 tablet (80 mg total) by mouth daily., Disp: 30 tablet, Rfl: 0 ?  chlorpheniramine-HYDROcodone 10-8 MG/5ML, Take 5 mLs by mouth every 12 (twelve) hours as needed., Disp: , Rfl:  ?  clopidogrel (PLAVIX) 75 MG tablet, Take 75 mg by mouth daily., Disp: , Rfl:  ?  clopidogrel (PLAVIX) 75 MG tablet, Take 1 tablet (75 mg total) by mouth daily., Disp: 30 tablet, Rfl: 10 ?  cyanocobalamin 1000 MCG tablet, Take 1,000 mcg by mouth daily., Disp: , Rfl:  ?  DULoxetine (CYMBALTA) 20 MG capsule, Take 20 mg by mouth daily., Disp: , Rfl:  ?  DULoxetine (CYMBALTA) 20 MG  capsule, Take 1 capsule (20 mg total) by mouth daily., Disp: 90 capsule, Rfl: 1 ?  fluticasone (FLONASE) 50 MCG/ACT nasal spray, Place 1 spray into both nostrils daily., Disp: , Rfl:  ?  ipratropium-albuterol (DUONEB) 0.5-2.5 (3) MG/3ML SOLN, Inhale 3 mLs into the lungs every 4 (four) hours as needed., Disp: , Rfl:  ?  ipratropium-albuterol (DUONEB) 0.5-2.5 (3) MG/3ML SOLN, Take 3 mls by nebulization 4 (four) times daily for 30 days, Disp: 360 mL, Rfl: 1 ?  metoprolol succinate (TOPROL-XL) 25 MG 24 hr tablet, Take 1 tablet (25 mg total) by mouth daily. Take with or immediately following a meal., Disp: 30 tablet, Rfl: 0 ?  nicotine (NICODERM CQ - DOSED IN MG/24 HOURS) 21 mg/24hr patch, Place 1 patch (21 mg total) onto the skin daily. (Patient not taking: Reported on 07/18/2021), Disp: 28 patch, Rfl: 0 ?  nitroGLYCERIN (NITROSTAT) 0.4 MG SL tablet, Place 1 tablet (0.4 mg total) under the tongue every 5 (five) minutes as needed for chest pain., Disp: 50 tablet, Rfl: 0 ?  nortriptyline (PAMELOR) 25 MG capsule, Take 25 mg by mouth at bedtime., Disp: , Rfl:  ?  Oxycodone HCl 10 MG TABS, Take 10 mg by mouth daily as needed., Disp: , Rfl:  ?  potassium chloride (KLOR-CON) 10 MEQ tablet, Take 4 tablets (40 mEq total) by mouth two (2) times a day., Disp: 300 tablet, Rfl: 0 ?  topiramate (TOPAMAX) 25 MG  tablet, Take 25 mg by mouth daily., Disp: , Rfl:  ? ?Past Medical History: ?Past Medical History:  ?Diagnosis Date  ? Acute ethmoidal sinusitis   ? Acute ethmoidal sinusitis   ? Acute maxillary sinusitis   ? Acute upper respiratory infections of unspecified site   ? Anxiety state, unspecified   ? Chest pain, unspecified   ? Depression   ? Diarrhea   ? Esophageal reflux   ? Generalized anxiety disorder   ? Hyperlipidemia   ? Hypoglycemia, unspecified   ? Lumbago   ? Nausea with vomiting   ? Obstructive chronic bronchitis with exacerbation ([place])   ? Other and unspecified hyperlipidemia   ? Other and unspecified peripheral  vertigo(386.19)   ? Other bursitis disorders   ? Other malaise and fatigue   ? Other specified diseases due to viruses   ? Other vitamin B12 deficiency anemia   ? Pain in limb   ? Pernicious anemia   ? Sleep related hypoventilation/hypoxemia in conditions classifiable elsewhere   ? Swelling, mass, or lump in head and neck   ? Tobacco use disorder   ? Unspecified disorder of external ear   ? Unspecified disorder of skin and subcutaneous tissue   ? Unspecified otitis media   ? ? ?Tobacco Use: ?Social History  ? ?Tobacco Use  ?Smoking Status Every Day  ? Packs/day: 2.00  ? Years: 30.00  ? Pack years: 60.00  ? Types: Cigarettes  ?Smokeless Tobacco Never  ?Tobacco Comments  ? Has been smoking for 30 years Wants to quit after she gets past all the heart stuff and changes in life.  Smoking 3 a day right now  ? ? ?Labs: ?Review Flowsheet   ? ?  ?  Latest Ref Rng & Units 11/29/2011 07/04/2021  ?Labs for ITP Cardiac and Pulmonary Rehab  ?Cholestrol 0 - 200 mg/dL 280   276    ?LDL (calc) 0 - 99 mg/dL 214   NOT CALCULATED    ?HDL-C >40 mg/dL 29   33    ?Trlycerides <150 mg/dL 186   183    ?Hemoglobin A1c 4.8 - 5.6 %  5.3    ?  ? ? Multiple values from one day are sorted in reverse-chronological order  ?  ?  ? ? ? ?Exercise Target Goals: ?Exercise Program Goal: ?Individual exercise prescription set using results from initial 6 min walk test and THRR while considering  patient?s activity barriers and safety.  ? ?Exercise Prescription Goal: ?Initial exercise prescription builds to 30-45 minutes a day of aerobic activity, 2-3 days per week.  Home exercise guidelines will be given to patient during program as part of exercise prescription that the participant will acknowledge. ? ? ?Education: Aerobic Exercise: ?- Group verbal and visual presentation on the components of exercise prescription. Introduces F.I.T.T principle from ACSM for exercise prescriptions.  Reviews F.I.T.T. principles of aerobic exercise including progression.  Written material given at graduation. ? ? ?Education: Resistance Exercise: ?- Group verbal and visual presentation on the components of exercise prescription. Introduces F.I.T.T principle from ACSM for exercise prescriptions  Reviews F.I.T.T. principles of resistance exercise including progression. Written material given at graduation. ?Flowsheet Row Cardiac Rehab from 07/27/2021 in [facility] Cardiac and Pulmonary Rehab  ?Education need identified 07/27/21  ? ?  ? ?  ?Education: Exercise & Equipment Safety: ?- Individual verbal instruction and demonstration of equipment use and safety with use of the equipment. ?Flowsheet Row Cardiac Rehab from 07/27/2021 in [facility] Cardiac and Pulmonary Rehab  ?  Education need identified 07/27/21  ?Date 07/27/21  ?Educator KL  ?Instruction Review Code 1- Verbalizes Understanding  ? ?  ? ? ?Education: Exercise Physiology & General Exercise Guidelines: ?- Group verbal and written instruction with models to review the exercise physiology of the cardiovascular system and associated critical values. Provides general exercise guidelines with specific guidelines to those with heart or lung disease.  ?Flowsheet Row Cardiac Rehab from 07/27/2021 in [facility] Cardiac and Pulmonary Rehab  ?Education need identified 07/27/21  ? ?  ? ? ?Education: Flexibility, Balance, Mind/Body Relaxation: ?- Group verbal and visual presentation with interactive activity on the components of exercise prescription. Introduces F.I.T.T principle from ACSM for exercise prescriptions. Reviews F.I.T.T. principles of flexibility and balance exercise training including progression. Also discusses the mind body connection.  Reviews various relaxation techniques to help reduce and manage stress (i.e. Deep breathing, progressive muscle relaxation, and visualization). Balance handout provided to take home. Written material given at graduation. ? ? ?Activity Barriers & Risk Stratification: ? Activity Barriers & Cardiac Risk Stratification  - 07/27/21 1300   ? ?  ? Activity Barriers & Cardiac Risk Stratification  ? Activity Barriers Deconditioning;Fibromyalgia   ? Cardiac Risk Stratification High   ? ?  ?  ? ?  ? ? ?6 Minute Walk: ? 6 Minute Walk   ? ? Ro

## 2021-08-29 ENCOUNTER — Other Ambulatory Visit: Payer: Self-pay

## 2021-08-29 DIAGNOSIS — I2111 ST elevation (STEMI) myocardial infarction involving right coronary artery: Secondary | ICD-10-CM | POA: Diagnosis not present

## 2021-08-29 NOTE — Progress Notes (Signed)
Called Lisa Davila as she has not been to rehab since orientation 3/22. Pt reports this is due to continuous heart symptoms that she is working with her health care team on. She reports she will be here Wed 4/26. When attendance improvement and potential discharge if attendance does not improve was mentioned, there was no response - asked several times if pt was still on other end of the line until I hung up the phone - will follow-up in person 4/26 as phone may have malfuncitoned. ?

## 2021-08-30 ENCOUNTER — Other Ambulatory Visit: Payer: Self-pay

## 2021-08-30 MED ORDER — ATORVASTATIN CALCIUM 80 MG PO TABS
ORAL_TABLET | ORAL | 3 refills | Status: DC
  Filled 2021-08-30: qty 90, 90d supply, fill #0
  Filled 2022-01-10 – 2022-01-18 (×2): qty 90, 90d supply, fill #1

## 2021-09-01 ENCOUNTER — Other Ambulatory Visit: Payer: Self-pay

## 2021-09-05 ENCOUNTER — Encounter: Payer: Self-pay | Admitting: *Deleted

## 2021-09-05 ENCOUNTER — Encounter: Payer: 59 | Attending: Internal Medicine

## 2021-09-05 ENCOUNTER — Emergency Department
Admission: EM | Admit: 2021-09-05 | Discharge: 2021-09-05 | Discharge status: Against Medical Advice | Payer: 59 | Attending: Emergency Medicine | Admitting: Emergency Medicine

## 2021-09-05 ENCOUNTER — Other Ambulatory Visit: Payer: Self-pay

## 2021-09-05 DIAGNOSIS — R140 Abdominal distension (gaseous): Secondary | ICD-10-CM | POA: Diagnosis not present

## 2021-09-05 DIAGNOSIS — R109 Unspecified abdominal pain: Secondary | ICD-10-CM | POA: Diagnosis not present

## 2021-09-05 DIAGNOSIS — I213 ST elevation (STEMI) myocardial infarction of unspecified site: Secondary | ICD-10-CM | POA: Insufficient documentation

## 2021-09-05 DIAGNOSIS — Z955 Presence of coronary angioplasty implant and graft: Secondary | ICD-10-CM | POA: Insufficient documentation

## 2021-09-05 DIAGNOSIS — R110 Nausea: Secondary | ICD-10-CM | POA: Insufficient documentation

## 2021-09-05 DIAGNOSIS — Z5321 Procedure and treatment not carried out due to patient leaving prior to being seen by health care provider: Secondary | ICD-10-CM | POA: Diagnosis not present

## 2021-09-05 LAB — COMPREHENSIVE METABOLIC PANEL
ALT: 19 U/L (ref 0–44)
AST: 18 U/L (ref 15–41)
Albumin: 3.7 g/dL (ref 3.5–5.0)
Alkaline Phosphatase: 85 U/L (ref 38–126)
Anion gap: 12 (ref 5–15)
BUN: <5 mg/dL — ABNORMAL LOW (ref 6–20)
CO2: 24 mmol/L (ref 22–32)
Calcium: 8.6 mg/dL — ABNORMAL LOW (ref 8.9–10.3)
Chloride: 102 mmol/L (ref 98–111)
Creatinine, Ser: 0.76 mg/dL (ref 0.44–1.00)
GFR, Estimated: >60 mL/min (ref >60)
Glucose, Bld: 79 mg/dL (ref 70–99)
Potassium: 3.4 mmol/L — ABNORMAL LOW (ref 3.5–5.1)
Sodium: 138 mmol/L (ref 135–145)
Total Bilirubin: 0.6 mg/dL (ref 0.3–1.2)
Total Protein: 6.8 g/dL (ref 6.5–8.1)

## 2021-09-05 LAB — CBC
HCT: 43.2 % (ref 36.0–46.0)
Hemoglobin: 14.2 g/dL (ref 12.0–15.0)
MCH: 30.9 pg (ref 26.0–34.0)
MCHC: 32.9 g/dL (ref 30.0–36.0)
MCV: 93.9 fL (ref 80.0–100.0)
Platelets: 382 10*3/uL (ref 150–400)
RBC: 4.6 MIL/uL (ref 3.87–5.11)
RDW: 13.3 % (ref 11.5–15.5)
WBC: 9.8 10*3/uL (ref 4.0–10.5)
nRBC: 0 % (ref 0.0–0.2)

## 2021-09-05 LAB — TROPONIN I (HIGH SENSITIVITY): Troponin I (High Sensitivity): 8 ng/L (ref <18)

## 2021-09-05 LAB — LIPASE, BLOOD: Lipase: 34 U/L (ref 11–51)

## 2021-09-05 NOTE — ED Triage Notes (Signed)
Pt has abd pain with nausea.  Pt reports feeling bloated.  BM x 3 today.  No vomiting   pt alert  speech clear.  ?

## 2021-09-12 ENCOUNTER — Telehealth: Payer: Self-pay

## 2021-09-12 ENCOUNTER — Other Ambulatory Visit: Payer: Self-pay

## 2021-09-12 DIAGNOSIS — E538 Deficiency of other specified B group vitamins: Secondary | ICD-10-CM | POA: Diagnosis not present

## 2021-09-12 DIAGNOSIS — L03211 Cellulitis of face: Secondary | ICD-10-CM | POA: Diagnosis not present

## 2021-09-12 DIAGNOSIS — I2510 Atherosclerotic heart disease of native coronary artery without angina pectoris: Secondary | ICD-10-CM | POA: Diagnosis not present

## 2021-09-12 DIAGNOSIS — R0609 Other forms of dyspnea: Secondary | ICD-10-CM | POA: Diagnosis not present

## 2021-09-12 DIAGNOSIS — I252 Old myocardial infarction: Secondary | ICD-10-CM | POA: Diagnosis not present

## 2021-09-12 DIAGNOSIS — Z955 Presence of coronary angioplasty implant and graft: Secondary | ICD-10-CM | POA: Diagnosis not present

## 2021-09-12 DIAGNOSIS — E876 Hypokalemia: Secondary | ICD-10-CM | POA: Diagnosis not present

## 2021-09-12 DIAGNOSIS — J4540 Moderate persistent asthma, uncomplicated: Secondary | ICD-10-CM | POA: Diagnosis not present

## 2021-09-12 DIAGNOSIS — R5381 Other malaise: Secondary | ICD-10-CM | POA: Diagnosis not present

## 2021-09-12 MED ORDER — ERGOCALCIFEROL 1.25 MG (50000 UT) PO CAPS
ORAL_CAPSULE | ORAL | 1 refills | Status: DC
  Filled 2021-09-12: qty 12, 84d supply, fill #0
  Filled 2022-08-10: qty 12, 84d supply, fill #1

## 2021-09-12 MED ORDER — SPIRONOLACTONE 25 MG PO TABS
25.0000 mg | ORAL_TABLET | Freq: Every day | ORAL | 1 refills | Status: DC
  Filled 2021-09-12: qty 90, 90d supply, fill #0

## 2021-09-12 MED ORDER — TOBRADEX 0.3-0.1 % OP OINT
TOPICAL_OINTMENT | OPHTHALMIC | 1 refills | Status: DC
  Filled 2021-09-12: qty 3.5, 7d supply, fill #0

## 2021-09-12 MED ORDER — ALPRAZOLAM 1 MG PO TABS
ORAL_TABLET | ORAL | 1 refills | Status: DC
  Filled 2021-09-12 – 2021-10-04 (×2): qty 60, 30d supply, fill #0

## 2021-09-12 MED ORDER — CLINDAMYCIN HCL 300 MG PO CAPS
ORAL_CAPSULE | ORAL | 0 refills | Status: DC
  Filled 2021-09-12: qty 21, 7d supply, fill #0

## 2021-09-12 NOTE — Telephone Encounter (Signed)
Patient called and stated she does not want to be discharged. Patient has not been here since her 6MWT on 3/22. I explained the attendance policy to her again. She states she will definitely come Wednesday, 5/10 at 4 PM. I explained to her if she does not come Wednesday, then we will have to discharge her from cardiac rehab as she has been no-call no-show and has not presented to rehab in 5 weeks. Patient understood. ?

## 2021-09-13 ENCOUNTER — Other Ambulatory Visit: Payer: Self-pay

## 2021-09-14 ENCOUNTER — Ambulatory Visit: Payer: 59

## 2021-09-15 ENCOUNTER — Encounter: Payer: Self-pay | Admitting: *Deleted

## 2021-09-15 DIAGNOSIS — Z955 Presence of coronary angioplasty implant and graft: Secondary | ICD-10-CM

## 2021-09-15 DIAGNOSIS — I213 ST elevation (STEMI) myocardial infarction of unspecified site: Secondary | ICD-10-CM

## 2021-09-15 NOTE — Progress Notes (Signed)
Cardiac Individual Treatment Plan ? ?Patient Details  ?Name: Lisa Davila ?MRN: [id number] ?Date of Birth: Jun 18, 1975 ?Referring Provider:   ?Flowsheet Row Cardiac Rehab from 07/27/2021 in [facility] Cardiac and Pulmonary Rehab  ?Referring Provider Lujean Amel MD  ? ?  ? ? ?Initial Encounter Date:  ?Flowsheet Row Cardiac Rehab from 07/27/2021 in [facility] Cardiac and Pulmonary Rehab  ?Date 07/27/21  ? ?  ? ? ?Visit Diagnosis: ST elevation myocardial infarction (STEMI), unspecified artery ([place]) ? ?Status post coronary artery stent placement ? ?Patient's Home Medications on Admission: ? ?Current Outpatient Medications:  ?  acetaminophen (TYLENOL) 500 MG tablet, Take by mouth. (Patient not taking: Reported on 07/18/2021), Disp: , Rfl:  ?  albuterol (PROVENTIL) (2.5 MG/3ML) 0.083% nebulizer solution, Take 2.5 mg by nebulization every 4 (four) hours as needed., Disp: , Rfl:  ?  albuterol (VENTOLIN HFA) 108 (90 Base) MCG/ACT inhaler, Inhale 2 puffs into the lungs every 6 hours as needed for wheezing., Disp: 18 g, Rfl: 5 ?  ALPRAZolam (XANAX) 1 MG tablet, Take 0.5-1 mg by mouth 2 (two) times daily as needed., Disp: , Rfl:  ?  ALPRAZolam (XANAX) 1 MG tablet, Take 1 tablet (1 mg total) by mouth 2 (two) times daily as needed for Sleep for up to 30 days, Disp: 60 tablet, Rfl: 1 ?  amoxicillin-clavulanate (AUGMENTIN) 875-125 MG tablet, Take 1 tablet by mouth 2 (two) times daily., Disp: , Rfl:  ?  aspirin 81 MG chewable tablet, Chew 1 tablet (81 mg total) by mouth daily., Disp: , Rfl:  ?  atorvastatin (LIPITOR) 80 MG tablet, Take 1 tablet (80 mg total) by mouth daily., Disp: 30 tablet, Rfl: 0 ?  atorvastatin (LIPITOR) 80 MG tablet, Take 1 tablet (80 mg total) by mouth once daily, Disp: 90 tablet, Rfl: 3 ?  chlorpheniramine-HYDROcodone 10-8 MG/5ML, Take 5 mLs by mouth every 12 (twelve) hours as needed., Disp: , Rfl:  ?  clindamycin (CLEOCIN) 300 MG capsule, Take 1 capsule (300 mg total) by mouth 3 (three) times daily for 7 days, Disp:  21 capsule, Rfl: 0 ?  clopidogrel (PLAVIX) 75 MG tablet, Take 75 mg by mouth daily., Disp: , Rfl:  ?  clopidogrel (PLAVIX) 75 MG tablet, Take 1 tablet (75 mg total) by mouth daily., Disp: 30 tablet, Rfl: 10 ?  cyanocobalamin 1000 MCG tablet, Take 1,000 mcg by mouth daily., Disp: , Rfl:  ?  DULoxetine (CYMBALTA) 20 MG capsule, Take 20 mg by mouth daily., Disp: , Rfl:  ?  DULoxetine (CYMBALTA) 20 MG capsule, Take 1 capsule (20 mg total) by mouth daily., Disp: 90 capsule, Rfl: 1 ?  ergocalciferol (VITAMIN D2) 1.25 MG (50000 UT) capsule, Take one capsule by mouth once a week, Disp: 12 capsule, Rfl: 1 ?  fluticasone (FLONASE) 50 MCG/ACT nasal spray, Place 1 spray into both nostrils daily., Disp: , Rfl:  ?  ipratropium-albuterol (DUONEB) 0.5-2.5 (3) MG/3ML SOLN, Inhale 3 mLs into the lungs every 4 (four) hours as needed., Disp: , Rfl:  ?  ipratropium-albuterol (DUONEB) 0.5-2.5 (3) MG/3ML SOLN, Take 3 mls by nebulization 4 (four) times daily for 30 days, Disp: 360 mL, Rfl: 1 ?  metoprolol succinate (TOPROL-XL) 25 MG 24 hr tablet, Take 1 tablet (25 mg total) by mouth daily. Take with or immediately following a meal., Disp: 30 tablet, Rfl: 0 ?  nicotine (NICODERM CQ - DOSED IN MG/24 HOURS) 21 mg/24hr patch, Place 1 patch (21 mg total) onto the skin daily. (Patient not taking: Reported on 07/18/2021),  Disp: 28 patch, Rfl: 0 ?  nitroGLYCERIN (NITROSTAT) 0.4 MG SL tablet, Place 1 tablet (0.4 mg total) under the tongue every 5 (five) minutes as needed for chest pain., Disp: 50 tablet, Rfl: 0 ?  nortriptyline (PAMELOR) 25 MG capsule, Take 25 mg by mouth at bedtime., Disp: , Rfl:  ?  Oxycodone HCl 10 MG TABS, Take 10 mg by mouth daily as needed., Disp: , Rfl:  ?  potassium chloride (KLOR-CON) 10 MEQ tablet, Take 4 tablets (40 mEq total) by mouth two (2) times a day., Disp: 300 tablet, Rfl: 0 ?  spironolactone (ALDACTONE) 25 MG tablet, Take 1 tablet (25 mg total) by mouth once daily, Disp: 90 tablet, Rfl: 1 ?   tobramycin-dexamethasone (TOBRADEX) ophthalmic ointment, Place into the right eye 3 (three) times daily for 10 days, Disp: 3.5 g, Rfl: 1 ?  topiramate (TOPAMAX) 25 MG tablet, Take 25 mg by mouth daily., Disp: , Rfl:  ? ?Past Medical History: ?Past Medical History:  ?Diagnosis Date  ? Acute ethmoidal sinusitis   ? Acute ethmoidal sinusitis   ? Acute maxillary sinusitis   ? Acute upper respiratory infections of unspecified site   ? Anxiety state, unspecified   ? Chest pain, unspecified   ? Depression   ? Diarrhea   ? Esophageal reflux   ? Generalized anxiety disorder   ? Hyperlipidemia   ? Hypoglycemia, unspecified   ? Lumbago   ? Nausea with vomiting   ? Obstructive chronic bronchitis with exacerbation ([place])   ? Other and unspecified hyperlipidemia   ? Other and unspecified peripheral vertigo(386.19)   ? Other bursitis disorders   ? Other malaise and fatigue   ? Other specified diseases due to viruses   ? Other vitamin B12 deficiency anemia   ? Pain in limb   ? Pernicious anemia   ? Sleep related hypoventilation/hypoxemia in conditions classifiable elsewhere   ? Swelling, mass, or lump in head and neck   ? Tobacco use disorder   ? Unspecified disorder of external ear   ? Unspecified disorder of skin and subcutaneous tissue   ? Unspecified otitis media   ? ? ?Tobacco Use: ?Social History  ? ?Tobacco Use  ?Smoking Status Every Day  ? Packs/day: 2.00  ? Years: 30.00  ? Pack years: 60.00  ? Types: Cigarettes  ?Smokeless Tobacco Never  ?Tobacco Comments  ? Has been smoking for 30 years Wants to quit after she gets past all the heart stuff and changes in life.  Smoking 3 a day right now  ? ? ?Labs: ?Review Flowsheet   ? ?  ?  Latest Ref Rng & Units 11/29/2011 07/04/2021  ?Labs for ITP Cardiac and Pulmonary Rehab  ?Cholestrol 0 - 200 mg/dL 280   276    ?LDL (calc) 0 - 99 mg/dL 214   NOT CALCULATED    ?HDL-C >40 mg/dL 29   33    ?Trlycerides <150 mg/dL 186   183    ?Hemoglobin A1c 4.8 - 5.6 %  5.3    ?  ?  ?  ? ? ? ?Exercise  Target Goals: ?Exercise Program Goal: ?Individual exercise prescription set using results from initial 6 min walk test and THRR while considering  patient?s activity barriers and safety.  ? ?Exercise Prescription Goal: ?Initial exercise prescription builds to 30-45 minutes a day of aerobic activity, 2-3 days per week.  Home exercise guidelines will be given to patient during program as part of exercise prescription that the participant  will acknowledge. ? ? ?Education: Aerobic Exercise: ?- Group verbal and visual presentation on the components of exercise prescription. Introduces F.I.T.T principle from ACSM for exercise prescriptions.  Reviews F.I.T.T. principles of aerobic exercise including progression. Written material given at graduation. ? ? ?Education: Resistance Exercise: ?- Group verbal and visual presentation on the components of exercise prescription. Introduces F.I.T.T principle from ACSM for exercise prescriptions  Reviews F.I.T.T. principles of resistance exercise including progression. Written material given at graduation. ?Flowsheet Row Cardiac Rehab from 07/27/2021 in [facility] Cardiac and Pulmonary Rehab  ?Education need identified 07/27/21  ? ?  ? ?  ?Education: Exercise & Equipment Safety: ?- Individual verbal instruction and demonstration of equipment use and safety with use of the equipment. ?Flowsheet Row Cardiac Rehab from 07/27/2021 in [facility] Cardiac and Pulmonary Rehab  ?Education need identified 07/27/21  ?Date 07/27/21  ?Educator KL  ?Instruction Review Code 1- Verbalizes Understanding  ? ?  ? ? ?Education: Exercise Physiology & General Exercise Guidelines: ?- Group verbal and written instruction with models to review the exercise physiology of the cardiovascular system and associated critical values. Provides general exercise guidelines with specific guidelines to those with heart or lung disease.  ?Flowsheet Row Cardiac Rehab from 07/27/2021 in [facility] Cardiac and Pulmonary Rehab  ?Education need  identified 07/27/21  ? ?  ? ? ?Education: Flexibility, Balance, Mind/Body Relaxation: ?- Group verbal and visual presentation with interactive activity on the components of exercise prescription. Introduces F.I.T.T principle f

## 2021-09-15 NOTE — Progress Notes (Signed)
Discharge Progress Report ? ?Patient Details  ?Name: Lisa Davila ?MRN: [id number] ?Date of Birth: 05/15/1975 ?Referring Provider:   ?Flowsheet Row Cardiac Rehab from 07/27/2021 in [facility] Cardiac and Pulmonary Rehab  ?Referring Provider Lisa Amel MD  ? ?  ? ? ? ?Number of Visits: 1 ? ?Reason for Discharge:  ?Early Exit:  Lack of attendance ? ?Smoking History:  ?Social History  ? ?Tobacco Use  ?Smoking Status Every Day  ? Packs/day: 2.00  ? Years: 30.00  ? Pack years: 60.00  ? Types: Cigarettes  ?Smokeless Tobacco Never  ?Tobacco Comments  ? Has been smoking for 30 years Wants to quit after she gets past all the heart stuff and changes in life.  Smoking 3 a day right now  ? ? ?Diagnosis:  ?ST elevation myocardial infarction (STEMI), unspecified artery ([employer]) ? ?Status post coronary artery stent placement ? ?ADL [facility]: ? ? ?Initial Exercise Prescription: ? Initial Exercise Prescription - 07/27/21 1300   ? ?  ? Date of Initial Exercise RX and Referring Provider  ? Date 07/27/21   ? Referring Provider Lisa Amel MD   ?  ? Oxygen  ? Maintain Oxygen Saturation 88% or higher   ?  ? Treadmill  ? MPH 2.3   ? Grade 1   ? Minutes 15   ? METs 3.08   ?  ? REL-XR  ? Level 2   ? Speed 50   ? Minutes 15   ? METs 3.8   ?  ? T5 Nustep  ? Level 2   ? SPM 80   ? Minutes 15   ? METs 3.8   ?  ? Prescription Details  ? Frequency (times per week) 3   ? Duration Progress to 30 minutes of continuous aerobic without signs/symptoms of physical distress   ?  ? Intensity  ? THRR 40-80% of Max Heartrate 128 - 159   ? Ratings of Perceived Exertion 11-13   ? Perceived Dyspnea 0-4   ?  ? Progression  ? Progression Continue to progress workloads to maintain intensity without signs/symptoms of physical distress.   ?  ? Resistance Training  ? Training Prescription Yes   ? Weight 5 lb   ? Reps 10-15   ? ?  ?  ? ?  ? ? ?Discharge Exercise Prescription (Final Exercise Prescription Changes): ? Exercise Prescription Changes - 07/27/21 1300   ? ?   ? Response to Exercise  ? Blood Pressure (Admit) 104/64   ? Blood Pressure (Exercise) 112/66   ? Blood Pressure (Exit) 100/64   ? Heart Rate (Admit) 98 bpm   ? Heart Rate (Exercise) 111 bpm   ? Heart Rate (Exit) 96 bpm   ? Oxygen Saturation (Admit) 98 %   ? Oxygen Saturation (Exercise) 99 %   ? Rating of Perceived Exertion (Exercise) 9   ? Perceived Dyspnea (Exercise) 0   ? Symptoms Slight SOB   ? Comments walk test results   ? ?  ?  ? ?  ? ? ?Functional Capacity: ? 6 Minute Walk   ? ? [place] Name 07/27/21 1233  ?  ?  ?  ? 6 Minute Walk  ? Phase Initial    ? Distance 1185 feet    ? Walk Time 6 minutes    ? # of Rest Breaks 0    ? MPH 2.24    ? METS 3.8    ? RPE 9    ? Perceived Dyspnea  0    ? VO2 Peak 13.31    ? Symptoms No    ? Resting HR 98 bpm    ? Resting BP 104/64    ? Resting Oxygen Saturation  98 %    ? Exercise Oxygen Saturation  during 6 min walk 99 %    ? Max Ex. HR 111 bpm    ? Max Ex. BP 112/66    ? 2 Minute Post BP 100/64    ? ?  ?  ? ?  ? ? ?Psychological, QOL, Others - Outcomes: ?PHQ 2/9: ? ?  07/27/2021  ? 12:20 PM  ?Depression screen PHQ 2/9  ?Decreased Interest 1  ?Down, Depressed, Hopeless 3  ?PHQ - 2 Score 4  ?Altered sleeping 0  ?Tired, decreased energy 3  ?Change in appetite 3  ?Feeling bad or failure about yourself  0  ?Trouble concentrating 0  ?Moving slowly or fidgety/restless 2  ?Suicidal thoughts 0  ?PHQ-9 Score 12  ?Difficult doing work/chores Somewhat difficult  ? ? ?Quality of Life: ? Quality of Life - 07/27/21 1308   ? ?  ? Quality of Life  ? Select Quality of Life   ?  ? Quality of Life Scores  ? Health/Function Pre 19.2 %   ? Socioeconomic Pre 29.25 %   ? Psych/Spiritual Pre 25.71 %   ? Family Pre 30 %   ? GLOBAL Pre 24.34 %   ? ?  ?  ? ?  ? ? ?Personal Goals: ?Goals established at orientation with interventions provided to work toward goal. ? Personal Goals and Risk Factors at Admission - 07/27/21 1305   ? ?  ? Core Components/Risk Factors/Patient Goals on Admission  ?  Weight  Management Yes;Weight Loss   ? Intervention Weight Management: Develop a combined nutrition and exercise program designed to reach desired caloric intake, while maintaining appropriate intake of nutrient and fiber, sodium and fats, and appropriate energy expenditure required for the weight goal.;Weight Management: Provide education and appropriate resources to help participant work on and attain dietary goals.;Weight Management/Obesity: Establish reasonable short term and long term weight goals.   ? Admit Weight 166 lb (75.3 kg)   ? Goal Weight: Short Term 161 lb (73 kg)   ? Goal Weight: Long Term 120 lb (54.4 kg)   ? Expected Outcomes Short Term: Continue to assess and modify interventions until short term weight is achieved;Long Term: Adherence to nutrition and physical activity/exercise program aimed toward attainment of established weight goal;Weight Loss: Understanding of general recommendations for a balanced deficit meal plan, which promotes 1-2 lb weight loss per week and includes a negative energy balance of [phone] kcal/d;Understanding recommendations for meals to include 15-35% energy as protein, 25-35% energy from fat, 35-60% energy from carbohydrates, less than '200mg'$  of dietary cholesterol, 20-35 gm of total fiber daily;Understanding of distribution of calorie intake throughout the day with the consumption of 4-5 meals/snacks   ? Tobacco Cessation Yes   ? Number of packs per day Lisa Davila is a current tobacco user. Intervention for tobacco cessation was provided at the initial medical review. She was asked about readiness to quit and reported she is not ready until she is able to take care of several stressors in her life at the moment. She will be ready to quit after that occurs. She is down to 3 cigarettes a day from 40 a day. . Patient was advised and educated about tobacco cessation using combination therapy, tobacco cessation classes, quit line, and  quit smoking apps. Patient demonstrated  understanding of this material. Staff will continue to provide encouragement and follow up with the patient throughout the program.   ? Intervention Assist the participant in steps to quit. Provide individualized education and counseling about committing to Tobacco Cessation, relapse prevention, and pharmacological support that can be provided by physician.;Advice worker, assist with locating and accessing local/national Quit Smoking programs, and support quit date choice.   ? Expected Outcomes Short Term: Will demonstrate readiness to quit, by selecting a quit date.;Short Term: Will quit all tobacco product use, adhering to prevention of relapse plan.;Long Term: Complete abstinence from all tobacco products for at least 12 months from quit date.   ? Hypertension Yes   ? Intervention Provide education on lifestyle modifcations including regular physical activity/exercise, weight management, moderate sodium restriction and increased consumption of fresh fruit, vegetables, and low fat dairy, alcohol moderation, and smoking cessation.;Monitor prescription use compliance.   ? Expected Outcomes Short Term: Continued assessment and intervention until BP is < 140/61m HG in hypertensive participants. < 130/877mHG in hypertensive participants with diabetes, heart failure or chronic kidney disease.;Long Term: Maintenance of blood pressure at goal levels.   ? Lipids Yes   ? Intervention Provide education and support for participant on nutrition & aerobic/resistive exercise along with prescribed medications to achieve LDL '70mg'$ , HDL >'40mg'$ .   ? Expected Outcomes Short Term: Participant states understanding of desired cholesterol values and is compliant with medications prescribed. Participant is following exercise prescription and nutrition guidelines.;Long Term: Cholesterol controlled with medications as prescribed, with individualized exercise RX and with personalized nutrition plan. Value goals: LDL < '70mg'$ ,  HDL > 40 mg.   ? Stress Yes   ? Intervention Offer individual and/or small group education and counseling on adjustment to heart disease, stress management and health-related lifestyle change. Teach and support self-he

## 2021-09-19 ENCOUNTER — Other Ambulatory Visit: Payer: Self-pay

## 2021-09-19 MED ORDER — HYDROCOD POLI-CHLORPHE POLI ER 10-8 MG/5ML PO SUER
ORAL | 0 refills | Status: DC
  Filled 2021-09-19: qty 115, 12d supply, fill #0

## 2021-09-20 ENCOUNTER — Other Ambulatory Visit: Payer: Self-pay

## 2021-09-21 ENCOUNTER — Other Ambulatory Visit: Payer: Self-pay

## 2021-09-21 DIAGNOSIS — I493 Ventricular premature depolarization: Secondary | ICD-10-CM | POA: Diagnosis not present

## 2021-09-21 DIAGNOSIS — R000 Tachycardia, unspecified: Secondary | ICD-10-CM | POA: Diagnosis not present

## 2021-09-21 DIAGNOSIS — R002 Palpitations: Secondary | ICD-10-CM | POA: Diagnosis not present

## 2021-09-30 ENCOUNTER — Other Ambulatory Visit: Payer: Self-pay

## 2021-10-04 ENCOUNTER — Other Ambulatory Visit: Payer: Self-pay

## 2021-10-04 ENCOUNTER — Other Ambulatory Visit
Admission: RE | Admit: 2021-10-04 | Discharge: 2021-10-04 | Disposition: A | Discharge status: Home / Self Care | Payer: 59 | Source: Ambulatory Visit | Attending: Student | Admitting: Student

## 2021-10-04 DIAGNOSIS — R053 Chronic cough: Secondary | ICD-10-CM | POA: Diagnosis not present

## 2021-10-04 DIAGNOSIS — E7800 Pure hypercholesterolemia, unspecified: Secondary | ICD-10-CM | POA: Diagnosis not present

## 2021-10-04 DIAGNOSIS — I25118 Atherosclerotic heart disease of native coronary artery with other forms of angina pectoris: Secondary | ICD-10-CM | POA: Diagnosis not present

## 2021-10-04 DIAGNOSIS — U071 COVID-19: Secondary | ICD-10-CM | POA: Diagnosis not present

## 2021-10-04 DIAGNOSIS — Z720 Tobacco use: Secondary | ICD-10-CM | POA: Diagnosis not present

## 2021-10-04 DIAGNOSIS — R0602 Shortness of breath: Secondary | ICD-10-CM | POA: Diagnosis not present

## 2021-10-04 DIAGNOSIS — E2740 Unspecified adrenocortical insufficiency: Secondary | ICD-10-CM | POA: Diagnosis not present

## 2021-10-04 DIAGNOSIS — Z955 Presence of coronary angioplasty implant and graft: Secondary | ICD-10-CM | POA: Diagnosis not present

## 2021-10-04 DIAGNOSIS — I493 Ventricular premature depolarization: Secondary | ICD-10-CM | POA: Diagnosis not present

## 2021-10-04 DIAGNOSIS — I2111 ST elevation (STEMI) myocardial infarction involving right coronary artery: Secondary | ICD-10-CM | POA: Diagnosis not present

## 2021-10-04 DIAGNOSIS — J449 Chronic obstructive pulmonary disease, unspecified: Secondary | ICD-10-CM | POA: Diagnosis not present

## 2021-10-04 DIAGNOSIS — R531 Weakness: Secondary | ICD-10-CM | POA: Diagnosis not present

## 2021-10-04 DIAGNOSIS — I5021 Acute systolic (congestive) heart failure: Secondary | ICD-10-CM | POA: Diagnosis not present

## 2021-10-04 DIAGNOSIS — F411 Generalized anxiety disorder: Secondary | ICD-10-CM | POA: Diagnosis not present

## 2021-10-04 DIAGNOSIS — E876 Hypokalemia: Secondary | ICD-10-CM | POA: Diagnosis not present

## 2021-10-04 DIAGNOSIS — R000 Tachycardia, unspecified: Secondary | ICD-10-CM | POA: Diagnosis not present

## 2021-10-04 DIAGNOSIS — I2510 Atherosclerotic heart disease of native coronary artery without angina pectoris: Secondary | ICD-10-CM | POA: Diagnosis not present

## 2021-10-04 LAB — BRAIN NATRIURETIC PEPTIDE: B Natriuretic Peptide: 160.2 pg/mL — ABNORMAL HIGH (ref 0.0–100.0)

## 2021-10-04 MED ORDER — PREDNISONE 10 MG PO TABS
ORAL_TABLET | ORAL | 0 refills | Status: DC
  Filled 2021-10-04: qty 12, 6d supply, fill #0

## 2021-10-04 MED ORDER — METOPROLOL SUCCINATE ER 25 MG PO TB24
25.0000 mg | ORAL_TABLET | Freq: Every day | ORAL | 0 refills | Status: DC
  Filled 2021-10-04: qty 30, 30d supply, fill #0

## 2021-10-04 MED ORDER — HYDROCOD POLI-CHLORPHE POLI ER 10-8 MG/5ML PO SUER
ORAL | 0 refills | Status: DC
  Filled 2021-10-04: qty 115, 12d supply, fill #0

## 2021-10-04 MED ORDER — NICOTINE 7 MG/24HR TD PT24
MEDICATED_PATCH | TRANSDERMAL | 0 refills | Status: DC
  Filled 2021-10-04: qty 14, 14d supply, fill #0

## 2021-10-04 MED ORDER — AZITHROMYCIN 250 MG PO TABS
ORAL_TABLET | ORAL | 0 refills | Status: DC
  Filled 2021-10-04: qty 6, 5d supply, fill #0

## 2021-10-04 MED ORDER — NICOTINE 14 MG/24HR TD PT24
MEDICATED_PATCH | TRANSDERMAL | 0 refills | Status: DC
  Filled 2022-02-15: qty 14, 14d supply, fill #0

## 2021-10-05 ENCOUNTER — Other Ambulatory Visit: Payer: Self-pay

## 2021-10-05 ENCOUNTER — Emergency Department
Admission: EM | Admit: 2021-10-05 | Discharge: 2021-10-06 | Discharge status: Against Medical Advice | Payer: 59 | Attending: Student in an Organized Health Care Education/Training Program | Admitting: Student in an Organized Health Care Education/Training Program

## 2021-10-05 ENCOUNTER — Emergency Department: Payer: 59

## 2021-10-05 DIAGNOSIS — R0789 Other chest pain: Secondary | ICD-10-CM | POA: Diagnosis not present

## 2021-10-05 DIAGNOSIS — Z955 Presence of coronary angioplasty implant and graft: Secondary | ICD-10-CM | POA: Diagnosis not present

## 2021-10-05 DIAGNOSIS — R000 Tachycardia, unspecified: Secondary | ICD-10-CM | POA: Diagnosis not present

## 2021-10-05 DIAGNOSIS — I10 Essential (primary) hypertension: Secondary | ICD-10-CM | POA: Diagnosis not present

## 2021-10-05 DIAGNOSIS — E876 Hypokalemia: Secondary | ICD-10-CM | POA: Diagnosis not present

## 2021-10-05 DIAGNOSIS — R079 Chest pain, unspecified: Secondary | ICD-10-CM | POA: Diagnosis not present

## 2021-10-05 DIAGNOSIS — I491 Atrial premature depolarization: Secondary | ICD-10-CM | POA: Diagnosis not present

## 2021-10-05 DIAGNOSIS — I213 ST elevation (STEMI) myocardial infarction of unspecified site: Secondary | ICD-10-CM | POA: Diagnosis not present

## 2021-10-05 LAB — BASIC METABOLIC PANEL
Anion gap: 5 (ref 5–15)
BUN: <5 mg/dL — ABNORMAL LOW (ref 6–20)
CO2: 25 mmol/L (ref 22–32)
Calcium: 8.7 mg/dL — ABNORMAL LOW (ref 8.9–10.3)
Chloride: 109 mmol/L (ref 98–111)
Creatinine, Ser: 0.61 mg/dL (ref 0.44–1.00)
GFR, Estimated: >60 mL/min (ref >60)
Glucose, Bld: 108 mg/dL — ABNORMAL HIGH (ref 70–99)
Potassium: 2.8 mmol/L — ABNORMAL LOW (ref 3.5–5.1)
Sodium: 139 mmol/L (ref 135–145)

## 2021-10-05 LAB — CBC
HCT: 41.3 % (ref 36.0–46.0)
Hemoglobin: 13.8 g/dL (ref 12.0–15.0)
MCH: 31.4 pg (ref 26.0–34.0)
MCHC: 33.4 g/dL (ref 30.0–36.0)
MCV: 93.9 fL (ref 80.0–100.0)
Platelets: 345 10*3/uL (ref 150–400)
RBC: 4.4 MIL/uL (ref 3.87–5.11)
RDW: 12.8 % (ref 11.5–15.5)
WBC: 8.9 10*3/uL (ref 4.0–10.5)
nRBC: 0 % (ref 0.0–0.2)

## 2021-10-05 LAB — TROPONIN I (HIGH SENSITIVITY): Troponin I (High Sensitivity): 8 ng/L (ref <18)

## 2021-10-05 LAB — POC URINE PREG, ED: Preg Test, Ur: NEGATIVE

## 2021-10-05 MED ORDER — METOPROLOL TARTRATE 25 MG PO TABS
12.5000 mg | ORAL_TABLET | Freq: Once | ORAL | Status: AC
  Administered 2021-10-05: 12.5 mg via ORAL
  Filled 2021-10-05: qty 1

## 2021-10-05 MED ORDER — POTASSIUM CHLORIDE 10 MEQ/100ML IV SOLN: 10.0000 meq | INTRAVENOUS | Status: DC

## 2021-10-05 MED ORDER — MAGNESIUM SULFATE 2 GM/50ML IV SOLN: 2.0000 g | Freq: Once | INTRAVENOUS | Status: DC

## 2021-10-05 MED ORDER — POTASSIUM CHLORIDE CRYS ER 20 MEQ PO TBCR
40.0000 meq | EXTENDED_RELEASE_TABLET | Freq: Once | ORAL | Status: DC
  Filled 2021-10-05: qty 2

## 2021-10-05 NOTE — ED Triage Notes (Signed)
Brought in via medic for left chest pain radiating to right shoulder and arm. Previous heart attack 3 months ago.

## 2021-10-05 NOTE — ED Provider Notes (Signed)
[facility] Provider Note    Event Date/Time   First MD Initiated Contact with Patient 10/05/21 2148     (approximate)   History   Chest Pain   HPI  Lisa Davila is a 46 y.o. female status post recent MI status post stent compliant with her medications presents to the ER for evaluation of chest pain and pressure episode happening today with pain and pressure rating down her right arm which is consistent with her previous MI.  Has had multiple episodes of similar discomfort previously since her heart attack and cardiac cath.  Has been seen in cardiology clinic for same with scheduled outpatient stress test.  She is also reportedly concerned she was called and told that her BNP lab result is elevated and her echocardiogram showed that her EF had decreased since the prior.     Physical Exam   Triage Vital Signs: ED Triage Vitals [10/05/21 2131]  Enc Vitals Group     BP 123/72     Pulse Rate (!) 51     Resp 19     Temp 98.8 F (37.1 C)     Temp Source Oral     SpO2 97 %     Weight      Height      Head Circumference      Peak Flow      Pain Score      Pain Loc      Pain Edu?      Excl. in [place]?     Most recent vital signs: Vitals:   10/05/21 2200 10/05/21 2259  BP: 121/62 118/72  Pulse: (!) 49 100  Resp: (!) 21   Temp:    SpO2: 98%      Constitutional: Alert  Eyes: Conjunctivae are normal.  Head: Atraumatic. Nose: No congestion/rhinnorhea. Mouth/Throat: Mucous membranes are moist.   Neck: Painless ROM.  Cardiovascular:   Good peripheral circulation. No m/g/r Respiratory: Normal respiratory effort.  No retractions. No wheeze or crackles Gastrointestinal: Soft and nontender.  Musculoskeletal:  no deformity. No edema Neurologic:  MAE spontaneously. No gross focal neurologic deficits are appreciated.  Skin:  Skin is warm, dry and intact. No rash noted. Psychiatric: Mood and affect are normal. Speech and behavior are  normal.    ED Results / Procedures / Treatments   Labs (all labs ordered are listed, but only abnormal results are displayed) Labs Reviewed  BASIC METABOLIC PANEL - Abnormal; Notable for the following components:      Result Value   Potassium 2.8 (*)    Glucose, Bld 108 (*)    BUN <5 (*)    Calcium 8.7 (*)    All other components within normal limits  CBC  MAGNESIUM  POC URINE PREG, ED  TROPONIN I (HIGH SENSITIVITY)  TROPONIN I (HIGH SENSITIVITY)     EKG  ED ECG REPORT I, Merlyn Lot, the attending physician, personally viewed and interpreted this ECG.   Date: 10/05/2021  EKG Time: 22:39  Rate: 85  Rhythm: sinus with frequent pvcs  Axis: normal  Intervals: normal qt  ST&T Change: no stemi, no deressions, nonspecific twave abn    RADIOLOGY    PROCEDURES:  Critical Care performed:   Procedures   MEDICATIONS ORDERED IN ED: Medications  potassium chloride SA (KLOR-CON M) CR tablet 40 mEq (has no administration in time range)  magnesium sulfate IVPB 2 g 50 mL (has no administration in time range)  metoprolol tartrate (LOPRESSOR)  tablet 12.5 mg (12.5 mg Oral Given 10/05/21 2259)     IMPRESSION / MDM / ASSESSMENT AND PLAN / ED COURSE  I reviewed the triage vital signs and the nursing notes.                              Differential diagnosis includes, but is not limited to, ACS, pericarditis, esophagitis, boerhaaves, pe, dissection, pna, bronchitis, costochondritis   Patient presented to the ER for evaluation of symptoms as described above.  She is clinically well-appearing in no acute distress.  Having frequent PVCs on EKG.  Given her recent MI and PCI this presenting complaint could reflect a potentially life-threatening illness therefore the patient will be placed on continuous pulse oximetry and telemetry for monitoring.  Laboratory evaluation will be sent to evaluate for the above complaints.      Clinical Course as of 10/05/21 2319  Wed Oct 05, 2021  2246 Chest x-ray by my interpretation shows no pneumothorax.  On review of recent echo does show EF decreased to 35 as compared to prior. [PR]  2247 Will give additional dose of p.o. metoprolol given her frequent PVCs. [PR]  2301 Patient's initial troponin is low.  Her potassium is also low we will give oral and IV potassium repletion [PR]  2318 Patient reassessed and she is pain-free in no acute distress.  Discussed potassium level need for oral and IV repletion.  Patient adamantly refusing IV repletion.  States that if her repeat heart enzyme is negative she has a job interview tomorrow that she does not want to mess and will feel comfortable following up with cardiology.  Will check magnesium will replete with p.o. potassium.  Patient be signed out to oncoming physician pending follow-up repeat troponin. [PR]    Clinical Course User Index [PR] Merlyn Lot, MD     FINAL CLINICAL IMPRESSION(S) / ED DIAGNOSES   Final diagnoses:  Chest pain, unspecified type  Hypokalemia     Rx / DC Orders   ED Discharge Orders     None        Note:  This document was prepared using Dragon voice recognition software and may include unintentional dictation errors.    Merlyn Lot, MD 10/05/21 [phone]

## 2021-10-05 NOTE — ED Provider Notes (Signed)
-----------------------------------------   11:25 PM on 10/05/2021 -----------------------------------------  Blood pressure 118/72, pulse 100, temperature 98.8 F (37.1 C), temperature source Oral, resp. rate (!) 21, SpO2 98 %.  Assuming care from Dr. Quentin Cornwall.  In short, Lisa Davila is a 46 y.o. female with a chief complaint of Chest Pain .  Refer to the original H&P for additional details.  The current plan of care is to follow-up repeat troponin and replete potassium, if additional workup is unremarkable, patient would be appropriate for discharge home.  ----------------------------------------- 12:05 AM on 10/06/2021 ----------------------------------------- Patient received supplemental potassium and repeat troponin was drawn, however patient is unwilling to wait for the result and is requesting to go home.  She is also unwilling to receive supplemental IV magnesium.  I explained that we cannot completely exclude cardiac cause of her chest pain without repeat troponin, patient expresses understanding but still desires to leave.  She understands that she would be leaving [place], was counseled to follow-up with her cardiologist and to return to the ED for new or worsening symptoms.   Blake Divine, MD 10/06/21 [phone]

## 2021-10-06 ENCOUNTER — Other Ambulatory Visit: Payer: Self-pay | Admitting: Student

## 2021-10-06 DIAGNOSIS — I519 Heart disease, unspecified: Secondary | ICD-10-CM

## 2021-10-06 LAB — MAGNESIUM: Magnesium: 2.1 mg/dL (ref 1.7–2.4)

## 2021-10-06 LAB — TROPONIN I (HIGH SENSITIVITY): Troponin I (High Sensitivity): 9 ng/L

## 2021-10-06 NOTE — ED Notes (Signed)
Patient left AMA, provider spoke with patient about decisions and potential effects.

## 2021-10-14 ENCOUNTER — Other Ambulatory Visit: Payer: Self-pay

## 2021-10-14 ENCOUNTER — Encounter
Admission: RE | Admit: 2021-10-14 | Discharge: 2021-10-14 | Disposition: A | Discharge status: Home / Self Care | Payer: 59 | Source: Ambulatory Visit | Attending: Student | Admitting: Student

## 2021-10-14 DIAGNOSIS — I519 Heart disease, unspecified: Secondary | ICD-10-CM | POA: Insufficient documentation

## 2021-10-14 DIAGNOSIS — I252 Old myocardial infarction: Secondary | ICD-10-CM | POA: Diagnosis not present

## 2021-10-14 MED ORDER — TECHNETIUM TC 99M-LABELED RED BLOOD CELLS IV KIT
20.0000 | PACK | Freq: Once | INTRAVENOUS | Status: AC
  Administered 2021-10-14: 21.64 via INTRAVENOUS

## 2021-10-25 ENCOUNTER — Emergency Department
Admission: EM | Admit: 2021-10-25 | Discharge: 2021-10-25 | Discharge status: Against Medical Advice | Payer: 59 | Attending: Emergency Medicine | Admitting: Emergency Medicine

## 2021-10-25 ENCOUNTER — Emergency Department: Payer: 59

## 2021-10-25 ENCOUNTER — Other Ambulatory Visit: Payer: Self-pay

## 2021-10-25 DIAGNOSIS — R079 Chest pain, unspecified: Secondary | ICD-10-CM

## 2021-10-25 DIAGNOSIS — Z5321 Procedure and treatment not carried out due to patient leaving prior to being seen by health care provider: Secondary | ICD-10-CM | POA: Insufficient documentation

## 2021-10-25 DIAGNOSIS — D72829 Elevated white blood cell count, unspecified: Secondary | ICD-10-CM | POA: Diagnosis not present

## 2021-10-25 DIAGNOSIS — R0602 Shortness of breath: Secondary | ICD-10-CM | POA: Diagnosis not present

## 2021-10-25 DIAGNOSIS — I213 ST elevation (STEMI) myocardial infarction of unspecified site: Secondary | ICD-10-CM | POA: Diagnosis not present

## 2021-10-25 DIAGNOSIS — R0789 Other chest pain: Secondary | ICD-10-CM | POA: Diagnosis not present

## 2021-10-25 DIAGNOSIS — R519 Headache, unspecified: Secondary | ICD-10-CM | POA: Diagnosis not present

## 2021-10-25 DIAGNOSIS — I2510 Atherosclerotic heart disease of native coronary artery without angina pectoris: Secondary | ICD-10-CM | POA: Diagnosis not present

## 2021-10-25 LAB — BRAIN NATRIURETIC PEPTIDE: B Natriuretic Peptide: 39.5 pg/mL (ref 0.0–100.0)

## 2021-10-25 LAB — CBC
HCT: 45.6 % (ref 36.0–46.0)
Hemoglobin: 15 g/dL (ref 12.0–15.0)
MCH: 30.9 pg (ref 26.0–34.0)
MCHC: 32.9 g/dL (ref 30.0–36.0)
MCV: 94 fL (ref 80.0–100.0)
Platelets: 378 10*3/uL (ref 150–400)
RBC: 4.85 MIL/uL (ref 3.87–5.11)
RDW: 12.2 % (ref 11.5–15.5)
WBC: 12.3 10*3/uL — ABNORMAL HIGH (ref 4.0–10.5)
nRBC: 0 % (ref 0.0–0.2)

## 2021-10-25 LAB — BASIC METABOLIC PANEL
Anion gap: 8 (ref 5–15)
BUN: 5 mg/dL — ABNORMAL LOW (ref 6–20)
CO2: 25 mmol/L (ref 22–32)
Calcium: 9.6 mg/dL (ref 8.9–10.3)
Chloride: 103 mmol/L (ref 98–111)
Creatinine, Ser: 0.91 mg/dL (ref 0.44–1.00)
GFR, Estimated: >60 mL/min (ref >60)
Glucose, Bld: 110 mg/dL — ABNORMAL HIGH (ref 70–99)
Potassium: 3.9 mmol/L (ref 3.5–5.1)
Sodium: 136 mmol/L (ref 135–145)

## 2021-10-25 LAB — MAGNESIUM: Magnesium: 2 mg/dL (ref 1.7–2.4)

## 2021-10-25 LAB — HCG, QUANTITATIVE, PREGNANCY: hCG, Beta Chain, Quant, S: 4 m[IU]/mL (ref <5)

## 2021-10-25 LAB — TROPONIN I (HIGH SENSITIVITY): Troponin I (High Sensitivity): 7 ng/L (ref <18)

## 2021-10-25 MED ORDER — LACTATED RINGERS IV BOLUS
1000.0000 mL | Freq: Once | INTRAVENOUS | Status: AC
  Administered 2021-10-25: 1000 mL via INTRAVENOUS

## 2021-10-25 MED ORDER — NITROGLYCERIN 0.4 MG SL SUBL: 0.4000 mg | SUBLINGUAL_TABLET | SUBLINGUAL | Status: DC | PRN

## 2021-10-25 MED ORDER — ASPIRIN 81 MG PO CHEW
243.0000 mg | CHEWABLE_TABLET | Freq: Once | ORAL | Status: AC
  Administered 2021-10-25: 243 mg via ORAL
  Filled 2021-10-25: qty 3

## 2021-10-25 MED ORDER — MAGNESIUM SULFATE 2 GM/50ML IV SOLN
2.0000 g | Freq: Once | INTRAVENOUS | Status: AC
  Administered 2021-10-25: 2 g via INTRAVENOUS
  Filled 2021-10-25: qty 50

## 2021-10-25 MED ORDER — PROCHLORPERAZINE EDISYLATE 10 MG/2ML IJ SOLN
10.0000 mg | Freq: Once | INTRAMUSCULAR | Status: AC
  Administered 2021-10-25: 10 mg via INTRAVENOUS
  Filled 2021-10-25: qty 2

## 2021-10-25 NOTE — ED Triage Notes (Signed)
Pt presents to ER from home c/o chest pain and sob that started around 1500 while not doing anything.  Pt states chest pain is left sided in nature and non radiating.  Pt has hx of previous MI 07/04/21.  Pt states she took some SL nitro at home without much relief.  Pt states sob was just exertional but has now become more constant.  Pt endorses some abd swelling and weight gain (30 lbs/1week).  Pt is A&O x4 at this time.

## 2021-10-25 NOTE — ED Notes (Signed)
Pt refused discharge vital signs at this time  

## 2021-10-25 NOTE — ED Notes (Signed)
Pt refused rpt troponin at this time. States she is feeling better now.

## 2021-10-25 NOTE — ED Provider Notes (Signed)
[facility] Provider Note    Event Date/Time   First MD Initiated Contact with Patient 10/25/21 2131     (approximate)   History   Chest Pain   HPI  Lisa Davila is a 46 y.o. female  with PMH significant for CAD, STEMI due to thrombotic subtotal occlusion of the proximal RCA s/p PCI/DES to proximal RCA (07/04/2021), palpitations (underwent EPS in 2021 without inducible SVT), HDL, GERD, anxiety, depression, fibromyalgia, TMJ arthralgia, obesity and tobacco abuse who presents for evaluation of some left-sided chest tightness that started today associate with shortness of breath.  Patient states he has also had a headache for 3 days slight cough.  He has appears her abdomen is little more bloated.  No fevers, hemoptysis, nausea, vomiting, diarrhea, urinary symptoms, rash or extremity pain.  No sore throat or earache.  No vision changes or vertigo.  She denies any illicit drug use or EtOH use.  She states she is compliant with all her medications.      Physical Exam  Triage Vital Signs: ED Triage Vitals  Enc Vitals Group     BP 10/25/21 2123 (!) 138/95     Pulse Rate 10/25/21 2123 (!) 107     Resp 10/25/21 2123 20     Temp 10/25/21 2123 98 F (36.7 C)     Temp Source 10/25/21 2123 Oral     SpO2 10/25/21 2123 100 %     Weight 10/25/21 2124 170 lb (77.1 kg)     Height 10/25/21 2124 '5\' 1"'$  (1.549 m)     Head Circumference --      Peak Flow --      Pain Score 10/25/21 2123 8     Pain Loc --      Pain Edu? --      Excl. in [place]? --     Most recent vital signs: Vitals:   10/25/21 2123  BP: (!) 138/95  Pulse: (!) 107  Resp: 20  Temp: 98 F (36.7 C)  SpO2: 100%    General: Awake, appears uncomfortable. CV:  Good peripheral perfusion.  2+ radial pulses.  Tachycardic. Resp:  Normal effort.  Clear bilaterally Abd:  No distention.  Nontender throughout.  Cranial nerves II through XII grossly intact.  No pronator drift.  No finger dysmetria.   Symmetric 5/5 strength of all extremities.  Sensation intact to light touch in all extremities.  Unremarkable unassisted gait.      ED Results / Procedures / Treatments  Labs (all labs ordered are listed, but only abnormal results are displayed) Labs Reviewed  BASIC METABOLIC PANEL - Abnormal; Notable for the following components:      Result Value   Glucose, Bld 110 (*)    BUN 5 (*)    All other components within normal limits  CBC - Abnormal; Notable for the following components:   WBC 12.3 (*)    All other components within normal limits  BRAIN NATRIURETIC PEPTIDE  MAGNESIUM  HCG, QUANTITATIVE, PREGNANCY  POC URINE PREG, ED  TROPONIN I (HIGH SENSITIVITY)  TROPONIN I (HIGH SENSITIVITY)     EKG  EKG is remarkable for sinus tachycardia with a ventricular rate of 109, nonspecific ST change in lead III and aVF as well as V3 without other clear evidence of acute ischemia or significant arrhythmia.   RADIOLOGY Chest reviewed by myself shows no focal consoidation, effusion, edema, pneumothorax or other clear acute thoracic process. I also reviewed radiology interpretation and agree  with findings described.    PROCEDURES:  Critical Care performed: No  .1-3 Lead EKG Interpretation  Performed by: Lucrezia Starch, MD Authorized by: Lucrezia Starch, MD     Interpretation: normal     ECG rate assessment: normal     Rhythm: sinus rhythm     Ectopy: none     Conduction: normal     The patient is on the cardiac monitor to evaluate for evidence of arrhythmia and/or significant heart rate changes.   MEDICATIONS ORDERED IN ED: Medications  nitroGLYCERIN (NITROSTAT) SL tablet 0.4 mg (has no administration in time range)  prochlorperazine (COMPAZINE) injection 10 mg (10 mg Intravenous Given 10/25/21 2204)  magnesium sulfate IVPB 2 g 50 mL (0 g Intravenous Stopped 10/25/21 2312)  lactated ringers bolus 1,000 mL (0 mLs Intravenous Stopped 10/25/21 2312)  aspirin chewable tablet  243 mg (243 mg Oral Given 10/25/21 2203)     IMPRESSION / MDM / ASSESSMENT AND PLAN / ED COURSE  I reviewed the triage vital signs and the nursing notes. Patient's presentation is most consistent with acute presentation with potential threat to life or bodily function.                               With regard to patient's chest pain differential considerations include ACS, PE, pneumonia COPD exacerbation.  With regard to her headache differential considerations include migraine, tension headache, effects of nitroglycerin, dehydration with lower suspicion for [facility] given gradual onset over several hours no history of any thunderclap onset.  No history of recent trauma.  No focal neurological deficits to suggest a CVA.  No exam findings of acute infectious process in the head or neck.  EKG is remarkable for sinus tachycardia with a ventricular rate of 109, nonspecific ST change in lead III and aVF as well as V3 without other clear evidence of acute ischemia or significant arrhythmia.  Initial troponin is nonelevated.  Chest reviewed by myself shows no focal consoidation, effusion, edema, pneumothorax or other clear acute thoracic process. I also reviewed radiology interpretation and agree with findings described.   BNP is 39.5.  CBC shows WBC count of 12.3 without evidence of acute anemia and normal platelets.  BMP shows no significant electrolyte or metabolic derangements.  Magnesium is WNL  Reassessment she states that she is feeling much better and headache is resolved.  Her chest pain is also resolved.  Discussed that I am concerned for possible unstable angina given normal description of symptoms although I think given improvement in her symptoms as well as her heart rate much lower suspicion at this time for PE.  Given she reports complete resolution of her headache some Compazine do not believe this requires further emergency evaluation.  Discussed at length with the patient my concerns for  life-threatening etiology of her chest pain including unstable angina I recommend she stay in the emergency room for continued evaluation and likely admission which would include serial troponins and cardiac monitoring and likely consult with cardiology.  She states she understands this but still wishes to leave.  I think she has capacity to leave against my advice and left AMA with instructions to return if she changes her mind otherwise follow-up with her cardiologist as soon as she is able to.  She states she is going to tomorrow.      FINAL CLINICAL IMPRESSION(S) / ED DIAGNOSES   Final diagnoses:  Chest pain, unspecified  type  Acute nonintractable headache, unspecified headache type     Rx / DC Orders   ED Discharge Orders     None        Note:  This document was prepared using Dragon voice recognition software and may include unintentional dictation errors.   Lucrezia Starch, MD 10/25/21 [phone]

## 2021-10-26 ENCOUNTER — Encounter: Payer: Self-pay | Admitting: Cardiovascular Disease

## 2021-10-26 ENCOUNTER — Other Ambulatory Visit: Payer: Self-pay

## 2021-10-26 ENCOUNTER — Ambulatory Visit: Payer: 59 | Admitting: Cardiovascular Disease

## 2021-10-26 VITALS — BP 108/70 | HR 111 | Ht 61.0 in | Wt 172.1 lb

## 2021-10-26 DIAGNOSIS — I5022 Chronic systolic (congestive) heart failure: Secondary | ICD-10-CM | POA: Diagnosis not present

## 2021-10-26 DIAGNOSIS — I25118 Atherosclerotic heart disease of native coronary artery with other forms of angina pectoris: Secondary | ICD-10-CM | POA: Diagnosis not present

## 2021-10-26 DIAGNOSIS — E782 Mixed hyperlipidemia: Secondary | ICD-10-CM | POA: Diagnosis not present

## 2021-10-26 DIAGNOSIS — Z720 Tobacco use: Secondary | ICD-10-CM

## 2021-10-26 DIAGNOSIS — I471 Supraventricular tachycardia: Secondary | ICD-10-CM | POA: Diagnosis not present

## 2021-10-26 MED ORDER — FUROSEMIDE 20 MG PO TABS: 20.0000 mg | ORAL_TABLET | Freq: Every day | ORAL | 5 refills | Status: DC

## 2021-10-26 MED ORDER — PREDNISONE 50 MG PO TABS: ORAL_TABLET | ORAL | 0 refills | Status: DC

## 2021-10-26 MED ORDER — SODIUM CHLORIDE 0.9% FLUSH: 3.0000 mL | Freq: Two times a day (BID) | INTRAVENOUS | Status: DC

## 2021-10-26 MED ORDER — FUROSEMIDE 20 MG PO TABS
20.0000 mg | ORAL_TABLET | Freq: Every day | ORAL | 5 refills | Status: DC
  Filled 2021-10-26 (×2): qty 30, 30d supply, fill #0

## 2021-10-26 MED ORDER — PREDNISONE 50 MG PO TABS
ORAL_TABLET | ORAL | 0 refills | Status: DC
  Filled 2021-10-26 (×2): qty 3, 1d supply, fill #0

## 2021-10-26 NOTE — Patient Instructions (Signed)
Medication Instructions:  Your physician has recommended you make the following change in your medication:   START Lasix 20 mg daily. An Rx has been sent to your pharmacy.  A Rx for Prednisone has been sent to your pharmacy. Take as directed.   *If you need a refill on your cardiac medications before your next appointment, please call your pharmacy*   Lab Work: Your physician recommends that you return for lab work  on 10/31/21 (Bmp, Cbc)  If you have labs (blood work) drawn today and your tests are completely normal, you will receive your results only by: [place] (if you have [place]) OR A paper copy in the mail If you have any lab test that is abnormal or we need to change your treatment, we will call you to review the results.   Testing/Procedures: Your physician has requested that you have a cardiac catheterization. Cardiac catheterization is used to diagnose and/or treat various heart conditions. Doctors may recommend this procedure for a number of different reasons. The most common reason is to evaluate chest pain. Chest pain can be a symptom of coronary artery disease (CAD), and cardiac catheterization can show whether plaque is narrowing or blocking your heart's arteries. This procedure is also used to evaluate the valves, as well as measure the blood flow and oxygen levels in different parts of your heart. For further information please visit [email or web address]. Please follow instruction sheet, as given.    Follow-Up: At [facility], you and your health needs are our priority.  As part of our continuing mission to provide you with exceptional heart care, we have created designated Provider Care Teams.  These Care Teams include your primary Cardiologist (physician) and Advanced Practice Providers (APPs -  Physician Assistants and Nurse Practitioners) who all work together to provide you with the care you need, when you need it.  We recommend signing up for the patient  portal called "MyChart".  Sign up information is provided on this After Visit Summary.  MyChart is used to connect with patients for Virtual Visits (Telemedicine).  Patients are able to view lab/test results, encounter notes, upcoming appointments, etc.  Non-urgent messages can be sent to your provider as well.   To learn more about what you can do with MyChart, go to [email or web address].    Your next appointment:   4 week(s)  The format for your next appointment:   In Person  Provider:   You may see Lisa Sacramento, MD or one of the following Advanced Practice Providers on your designated Care Team:   Lisa Hodgkins, NP Lisa Faith, PA-C Lisa Kathlen Mody, PA-C}    Other Instructions  [address] [place], [place] [address] Dept: [phone] Loc: [phone]  Lisa Davila  10/26/2021  You are scheduled for a Cardiac Catheterization on Monday, July 3 with Dr. Kathlyn Davila.  1. Please arrive at the [facility]: [place] at 8:30 AM (This time is one hour before your procedure to ensure your preparation). Free valet parking service is available.   Special note: Every effort is made to have your procedure done on time. Please understand that emergencies sometimes delay scheduled procedures.  2. Diet: Do not eat solid foods after midnight.  You may have clear liquids until 5 AM upon the day of the procedure.  3. Labs: You will need to have blood drawn on Monday, June 26 at  [facility], Go to 1st desk on your right to register.  Address: [address], [place] [address]  Open: 8am - 5pm  Phone: [phone]. You do not need to be fasting.  4. Medication instructions in preparation for your procedure:   Contrast Allergy: Yes, Please take Prednisone '50mg'$  by mouth at: Thirteen hours prior to cath 8:00pm  on Sunday Seven hours prior to cath 1:00am on Monday And prior to leaving home please take last dose of Prednisone '50mg'$  and Benadryl '50mg'$  by mouth.       Stop taking, Lasix (Furosemide)  Monday, July 3,   On the morning of your procedure, take Aspirin and any morning medicines NOT listed above.  You may use sips of water.  5. Plan to go home the same day, you will only stay overnight if medically necessary. 6. You MUST have a responsible adult to drive you home. 7. An adult MUST be with you the first 24 hours after you arrive home. 8. Bring a current list of your medications, and the last time and date medication taken. 9. Bring ID and current insurance cards. 10.Please wear clothes that are easy to get on and off and wear slip-on shoes.  Thank you for allowing [place] to care for you!   -- [facility]   Important Information About Sugar

## 2021-10-26 NOTE — Progress Notes (Signed)
Cardiology Office Note   Date:  10/26/2021   ID:  FEIGE LOWDERMILK, DOB 11/22/1975, MRN [id number]  PCP:  Tracie Harrier, MD  Cardiologist:   Kathlyn Sacramento, MD   Chief Complaint  Patient presents with   Other    Irregular heart rate/CAD pt c/o chest pain and weight gain/fluid retention. Meds reviewed verbally with pt.      History of Present Illness: Lisa Davila is a 46 y.o. female who was referred by Dr. Ginette Pitman for evaluation of palpitations and to establish cardiovascular care.  She is transferring from Dr. Clayborn Bigness. She has known history of SVT status post previous EP study with no inducible arrhythmia, essential hypertension, hyperlipidemia, fibromyalgia, tobacco use, anxiety and hypokalemia. She presented in February 2023 with chest pain and was found to have inferior ST elevation.  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  There was mild nonobstructive disease affecting the left coronary arteries.  Echocardiogram showed an EF of 50 to 55%.  Brilinta was switched to clopidogrel due to persistent dyspnea. She had COVID-19 infection in May.  She had recurrent chest pain after that we will multiple emergency room visits. Repeat echocardiogram in April showed an EF of 35%. She had a MUGA scan earlier this month which showed an EF of 35%.  She went to the emergency room yesterday with chest pain and headache.  EKG showed no acute changes other than sinus tachycardia.  Troponin was normal.  Overall, over the last few months she experienced worsening exertional dyspnea with minimal exertion, tachycardia, orthopnea and progressive weight gain of 20 pounds with abdominal swelling.  She was placed on spironolactone but did not tolerate the medication due to GI symptoms.  She cut down on tobacco use to 2 to 3 cigarettes a  day.  She works as a Chartered certified accountant at [employer] but has not been able to resume her work since she had myocardial infarction.   Past Medical History:  Diagnosis Date   Acute ethmoidal sinusitis    Acute ethmoidal sinusitis    Acute maxillary sinusitis    Acute upper respiratory infections of unspecified site    Anxiety state, unspecified    Chest pain, unspecified    Coronary artery disease    Depression    Diarrhea    Esophageal reflux    Generalized anxiety disorder    Hyperlipidemia    Hypoglycemia, unspecified    Lumbago    Nausea with vomiting    Obstructive chronic bronchitis with exacerbation (HCC)    Other and unspecified hyperlipidemia    Other and unspecified peripheral vertigo(386.19)    Other bursitis disorders    Other malaise and fatigue    Other specified diseases due to viruses    Other vitamin B12 deficiency anemia    Pain in limb    Pernicious anemia    Sleep related hypoventilation/hypoxemia in conditions classifiable elsewhere    Swelling, mass, or lump in head and neck    Tobacco use disorder    Unspecified disorder of external ear    Unspecified disorder of skin and subcutaneous tissue    Unspecified otitis media     Past Surgical History:  Procedure Laterality Date   CESAREAN SECTION     CORONARY/GRAFT ACUTE MI REVASCULARIZATION N/A 07/04/2021   Procedure: Coronary/Graft Acute MI Revascularization;  Surgeon: Wellington Hampshire, MD;  Location: [place]  [facility];  Service: Cardiovascular;  Laterality: N/A;   LEFT HEART CATH AND CORONARY ANGIOGRAPHY N/A 07/04/2021   Procedure: LEFT HEART CATH AND CORONARY ANGIOGRAPHY;  Surgeon: Wellington Hampshire, MD;  Location: [place] [facility];  Service: Cardiovascular;  Laterality: N/A;     Current Outpatient Medications  Medication Sig Dispense Refill   albuterol (VENTOLIN HFA) 108 (90 Base) MCG/ACT inhaler Inhale 2 puffs into the lungs every 6 hours as needed for wheezing. 18 g 5   ALPRAZolam (XANAX) 1 MG  tablet Take 1 tablet (1 mg total) by mouth 2 (two) times daily as needed for Sleep for up to 30 days 60 tablet 1   aspirin 81 MG chewable tablet Chew 1 tablet (81 mg total) by mouth daily.     atorvastatin (LIPITOR) 80 MG tablet Take 1 tablet (80 mg total) by mouth once daily 90 tablet 3   clopidogrel (PLAVIX) 75 MG tablet Take 1 tablet (75 mg total) by mouth daily. 30 tablet 10   cyanocobalamin 1000 MCG tablet Take 1,000 mcg by mouth daily.     cyclobenzaprine (FLEXERIL) 10 MG tablet Take 10 mg by mouth 2 (two) times daily as needed.     ergocalciferol (VITAMIN D2) 1.25 MG (50000 UT) capsule Take one capsule by mouth once a week 12 capsule 1   metoprolol succinate (TOPROL-XL) 25 MG 24 hr tablet Take 1 tablet (25 mg total) by mouth once daily 30 tablet 0   nitroGLYCERIN (NITROSTAT) 0.4 MG SL tablet Place 1 tablet (0.4 mg total) under the tongue every 5 (five) minutes as needed for chest pain. 50 tablet 0   potassium chloride (KLOR-CON) 10 MEQ tablet Take 4 tablets (40 mEq total) by mouth two (2) times a day. (Patient taking differently: Take by mouth 3 (three) times daily.) 300 tablet 0   nicotine (NICODERM CQ - DOSED IN MG/24 HOURS) 14 mg/24hr patch Place 1 patch onto the skin daily for 14 days (Patient not taking: Reported on 10/26/2021) 14 patch 0   No current facility-administered medications for this visit.    Allergies:   Buspirone, Citalopram, Metrizamide, Sulfa antibiotics, Contrast media [iodinated contrast media], Sulfa drugs cross reactors, and Sulfasalazine    Social History:  The patient  reports that she has been smoking cigarettes. She has a 15.00 pack-year smoking history. She has never used smokeless tobacco. She reports that she does not drink alcohol and does not use drugs.   Family History:  The patient's family history includes Breast cancer in her maternal grandmother; Cancer in her father and paternal grandfather; Depression in her mother; Hypertension in her sister.     ROS:  Please see the history of present illness.   Otherwise, review of systems are positive for none.   All other systems are reviewed and negative.    PHYSICAL EXAM: VS:  BP 108/70 (BP Location: Right Arm, Patient Position: Sitting, Cuff Size: Normal)   Pulse (!) 111   Ht '5\' 1"'$  (1.549 m)   Wt 172 lb 2 oz (78.1 kg)   SpO2 98%   BMI 32.52 kg/m  , BMI Body mass index is 32.52 kg/m. GEN: Well nourished, well developed, in no acute distress  HEENT: normal  Neck: no JVD, carotid bruits, or masses Cardiac: RRR; no murmurs, rubs, or gallops,no edema  Respiratory:  clear to auscultation bilaterally, normal work of breathing GI: soft, nontender, nondistended, + BS MS: no deformity or atrophy  Skin: warm and dry, no rash Neuro:  Strength and  sensation are intact Psych: euthymic mood, full affect   EKG:  EKG is ordered today. The ekg ordered today demonstrates sinus tachycardia with left axis deviation and prior inferior infarct.   Recent Labs: 09/05/2021: ALT 19 10/25/2021: B Natriuretic Peptide 39.5; BUN 5; Creatinine, Ser 0.91; Hemoglobin 15.0; Magnesium 2.0; Platelets 378; Potassium 3.9; Sodium 136    Lipid Panel    Component Value Date/Time   CHOL 276 (H) 07/04/2021 0900   CHOL 280 (H) 11/29/2011 1326   TRIG 183 (H) 07/04/2021 0900   TRIG 186 11/29/2011 1326   HDL 33 (L) 07/04/2021 0900   HDL 29 (L) 11/29/2011 1326   CHOLHDL 8.4 07/04/2021 0900   VLDL 37 07/04/2021 0900   VLDL 37 11/29/2011 1326   LDLCALC NOT CALCULATED 07/04/2021 0900   LDLCALC 214 (H) 11/29/2011 1326      Wt Readings from Last 3 Encounters:  10/26/21 172 lb 2 oz (78.1 kg)  10/25/21 170 lb (77.1 kg)  09/05/21 170 lb (77.1 kg)          10/26/2021    3:07 PM  PAD Screen  Previous PAD dx? No  Previous surgical procedure? No  Pain with walking? No  Feet/toe relief with dangling? No  Painful, non-healing ulcers? No  Extremities discolored? No      ASSESSMENT AND PLAN:  1.  Coronary  artery disease involving native coronary arteries other forms of angina: The patient initially did well after PCI and drug-eluting stent placement to the right coronary artery in the setting of inferior STEMI.  However, over the last few months, she experienced recurrent exertional chest pain and sometimes symptoms at rest.  Her symptoms are worrisome for recurrent obstructive disease.  Due to that and given drop in her ejection fraction, I recommend proceeding with a right and left cardiac catheterization possible PCI.  I discussed the procedure in details as well as risk and benefits.  2.  Chronic systolic heart failure: Ejection fraction postmyocardial infarction was 50 to 55%.  However, recently she has been having symptoms of volume overload and MUGA scan showed a drop in ejection fraction to 35% which is concerning.  She appears to be volume overloaded and thus I elected to add furosemide 20 mg once daily.  Check basic metabolic profile in 1 week given known history of severe hypokalemia.  She did not tolerate prior lactone due to GI symptoms and we might need to consider eplerenone. She is only on small dose Toprol due to low blood pressure at baseline.  I doubt that she will be able to tolerate Entresto. Given resting sinus tachycardia, we should consider ivabradine.  We will also plan on adding an SGLT2 inhibitor at some point in the near future.  3.  Paroxysmal supraventricular tachycardia: She is mostly having issues with sinus tachycardia at the present time.  Continue small dose Toprol.  4.  Hyperlipidemia: Continue treatment with high-dose atorvastatin.  She will require a follow-up lipid profile in the near future.  She is known to have elevated triglyceride.  5.  Tobacco use: She cut down to 2 to 3 cigarettes daily.  She reports difficulty in quitting due to known history of anxiety and depression.  6.  Hypokalemia: Continue potassium replacement and will consider adding  eplerenone.   Disposition: Proceed with a right and left cardiac catheterization follow-up after.  Signed,  Kathlyn Sacramento, MD  10/26/2021 3:19 PM    [place]

## 2021-10-26 NOTE — H&P (View-Only) (Signed)
Cardiology Office Note   Date:  10/26/2021   ID:  Lisa Davila, DOB [birth year], MRN [id number]  PCP:  Tracie Harrier, MD  Cardiologist:   Kathlyn Sacramento, MD   Chief Complaint  Patient presents with   Other    Irregular heart rate/CAD pt c/o chest pain and weight gain/fluid retention. Meds reviewed verbally with pt.      History of Present Illness: Lisa Davila is a 46 y.o. female who was referred by Dr. Ginette Pitman for evaluation of palpitations and to establish cardiovascular care.  She is transferring from Dr. Clayborn Bigness. She has known history of SVT status post previous EP study with no inducible arrhythmia, essential hypertension, hyperlipidemia, fibromyalgia, tobacco use, anxiety and hypokalemia. She presented in February 2023 with chest pain and was found to have inferior ST elevation.  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  There was mild nonobstructive disease affecting the left coronary arteries.  Echocardiogram showed an EF of 50 to 55%.  Brilinta was switched to clopidogrel due to persistent dyspnea. She had COVID-19 infection in May.  She had recurrent chest pain after that we will multiple emergency room visits. Repeat echocardiogram in April showed an EF of 35%. She had a MUGA scan earlier this month which showed an EF of 35%.  She went to the emergency room yesterday with chest pain and headache.  EKG showed no acute changes other than sinus tachycardia.  Troponin was normal.  Overall, over the last few months she experienced worsening exertional dyspnea with minimal exertion, tachycardia, orthopnea and progressive weight gain of 20 pounds with abdominal swelling.  She was placed on spironolactone but did not tolerate the medication due to GI symptoms.  She cut down on tobacco use to 2 to 3 cigarettes a  day.  She works as a Chartered certified accountant at [employer] but has not been able to resume her work since she had myocardial infarction.   Past Medical History:  Diagnosis Date   Acute ethmoidal sinusitis    Acute ethmoidal sinusitis    Acute maxillary sinusitis    Acute upper respiratory infections of unspecified site    Anxiety state, unspecified    Chest pain, unspecified    Coronary artery disease    Depression    Diarrhea    Esophageal reflux    Generalized anxiety disorder    Hyperlipidemia    Hypoglycemia, unspecified    Lumbago    Nausea with vomiting    Obstructive chronic bronchitis with exacerbation (HCC)    Other and unspecified hyperlipidemia    Other and unspecified peripheral vertigo(386.19)    Other bursitis disorders    Other malaise and fatigue    Other specified diseases due to viruses    Other vitamin B12 deficiency anemia    Pain in limb    Pernicious anemia    Sleep related hypoventilation/hypoxemia in conditions classifiable elsewhere    Swelling, mass, or lump in head and neck    Tobacco use disorder    Unspecified disorder of external ear    Unspecified disorder of skin and subcutaneous tissue    Unspecified otitis media     Past Surgical History:  Procedure Laterality Date   CESAREAN SECTION     CORONARY/GRAFT ACUTE MI REVASCULARIZATION N/A 07/04/2021   Procedure: Coronary/Graft Acute MI Revascularization;  Surgeon: Wellington Hampshire, MD;  Location: [facility];  Service: Cardiovascular;  Laterality: N/A;   LEFT HEART CATH AND CORONARY ANGIOGRAPHY N/A 07/04/2021   Procedure: LEFT HEART CATH AND CORONARY ANGIOGRAPHY;  Surgeon: Wellington Hampshire, MD;  Location: [facility];  Service: Cardiovascular;  Laterality: N/A;     Current Outpatient Medications  Medication Sig Dispense Refill   albuterol (VENTOLIN HFA) 108 (90 Base) MCG/ACT inhaler Inhale 2 puffs into the lungs every 6 hours as needed for wheezing. 18 g 5   ALPRAZolam (XANAX) 1 MG  tablet Take 1 tablet (1 mg total) by mouth 2 (two) times daily as needed for Sleep for up to 30 days 60 tablet 1   aspirin 81 MG chewable tablet Chew 1 tablet (81 mg total) by mouth daily.     atorvastatin (LIPITOR) 80 MG tablet Take 1 tablet (80 mg total) by mouth once daily 90 tablet 3   clopidogrel (PLAVIX) 75 MG tablet Take 1 tablet (75 mg total) by mouth daily. 30 tablet 10   cyanocobalamin 1000 MCG tablet Take 1,000 mcg by mouth daily.     cyclobenzaprine (FLEXERIL) 10 MG tablet Take 10 mg by mouth 2 (two) times daily as needed.     ergocalciferol (VITAMIN D2) 1.25 MG (50000 UT) capsule Take one capsule by mouth once a week 12 capsule 1   metoprolol succinate (TOPROL-XL) 25 MG 24 hr tablet Take 1 tablet (25 mg total) by mouth once daily 30 tablet 0   nitroGLYCERIN (NITROSTAT) 0.4 MG SL tablet Place 1 tablet (0.4 mg total) under the tongue every 5 (five) minutes as needed for chest pain. 50 tablet 0   potassium chloride (KLOR-CON) 10 MEQ tablet Take 4 tablets (40 mEq total) by mouth two (2) times a day. (Patient taking differently: Take by mouth 3 (three) times daily.) 300 tablet 0   nicotine (NICODERM CQ - DOSED IN MG/24 HOURS) 14 mg/24hr patch Place 1 patch onto the skin daily for 14 days (Patient not taking: Reported on 10/26/2021) 14 patch 0   No current facility-administered medications for this visit.    Allergies:   Buspirone, Citalopram, Metrizamide, Sulfa antibiotics, Contrast media [iodinated contrast media], Sulfa drugs cross reactors, and Sulfasalazine    Social History:  The patient  reports that she has been smoking cigarettes. She has a 15.00 pack-year smoking history. She has never used smokeless tobacco. She reports that she does not drink alcohol and does not use drugs.   Family History:  The patient's family history includes Breast cancer in her maternal grandmother; Cancer in her father and paternal grandfather; Depression in her mother; Hypertension in her sister.     ROS:  Please see the history of present illness.   Otherwise, review of systems are positive for none.   All other systems are reviewed and negative.    PHYSICAL EXAM: VS:  BP 108/70 (BP Location: Right Arm, Patient Position: Sitting, Cuff Size: Normal)   Pulse (!) 111   Ht '5\' 1"'$  (1.549 m)   Wt 172 lb 2 oz (78.1 kg)   SpO2 98%   BMI 32.52 kg/m  , BMI Body mass index is 32.52 kg/m. GEN: Well nourished, well developed, in no acute distress  HEENT: normal  Neck: no JVD, carotid bruits, or masses Cardiac: RRR; no murmurs, rubs, or gallops,no edema  Respiratory:  clear to auscultation bilaterally, normal work of breathing GI: soft, nontender, nondistended, + BS MS: no deformity or atrophy  Skin: warm and dry, no rash Neuro:  Strength and  sensation are intact Psych: euthymic mood, full affect   EKG:  EKG is ordered today. The ekg ordered today demonstrates sinus tachycardia with left axis deviation and prior inferior infarct.   Recent Labs: 09/05/2021: ALT 19 10/25/2021: B Natriuretic Peptide 39.5; BUN 5; Creatinine, Ser 0.91; Hemoglobin 15.0; Magnesium 2.0; Platelets 378; Potassium 3.9; Sodium 136    Lipid Panel    Component Value Date/Time   CHOL 276 (H) 07/04/2021 0900   CHOL 280 (H) 11/29/2011 1326   TRIG 183 (H) 07/04/2021 0900   TRIG 186 11/29/2011 1326   HDL 33 (L) 07/04/2021 0900   HDL 29 (L) 11/29/2011 1326   CHOLHDL 8.4 07/04/2021 0900   VLDL 37 07/04/2021 0900   VLDL 37 11/29/2011 1326   LDLCALC NOT CALCULATED 07/04/2021 0900   LDLCALC 214 (H) 11/29/2011 1326      Wt Readings from Last 3 Encounters:  10/26/21 172 lb 2 oz (78.1 kg)  10/25/21 170 lb (77.1 kg)  09/05/21 170 lb (77.1 kg)          10/26/2021    3:07 PM  PAD Screen  Previous PAD dx? No  Previous surgical procedure? No  Pain with walking? No  Feet/toe relief with dangling? No  Painful, non-healing ulcers? No  Extremities discolored? No      ASSESSMENT AND PLAN:  1.  Coronary  artery disease involving native coronary arteries other forms of angina: The patient initially did well after PCI and drug-eluting stent placement to the right coronary artery in the setting of inferior STEMI.  However, over the last few months, she experienced recurrent exertional chest pain and sometimes symptoms at rest.  Her symptoms are worrisome for recurrent obstructive disease.  Due to that and given drop in her ejection fraction, I recommend proceeding with a right and left cardiac catheterization possible PCI.  I discussed the procedure in details as well as risk and benefits.  2.  Chronic systolic heart failure: Ejection fraction postmyocardial infarction was 50 to 55%.  However, recently she has been having symptoms of volume overload and MUGA scan showed a drop in ejection fraction to 35% which is concerning.  She appears to be volume overloaded and thus I elected to add furosemide 20 mg once daily.  Check basic metabolic profile in 1 week given known history of severe hypokalemia.  She did not tolerate prior lactone due to GI symptoms and we might need to consider eplerenone. She is only on small dose Toprol due to low blood pressure at baseline.  I doubt that she will be able to tolerate Entresto. Given resting sinus tachycardia, we should consider ivabradine.  We will also plan on adding an SGLT2 inhibitor at some point in the near future.  3.  Paroxysmal supraventricular tachycardia: She is mostly having issues with sinus tachycardia at the present time.  Continue small dose Toprol.  4.  Hyperlipidemia: Continue treatment with high-dose atorvastatin.  She will require a follow-up lipid profile in the near future.  She is known to have elevated triglyceride.  5.  Tobacco use: She cut down to 2 to 3 cigarettes daily.  She reports difficulty in quitting due to known history of anxiety and depression.  6.  Hypokalemia: Continue potassium replacement and will consider adding  eplerenone.   Disposition: Proceed with a right and left cardiac catheterization follow-up after.  Signed,  Kathlyn Sacramento, MD  10/26/2021 3:19 PM    [facility]

## 2021-10-27 ENCOUNTER — Encounter: Payer: Self-pay | Admitting: Cardiovascular Disease

## 2021-10-27 ENCOUNTER — Other Ambulatory Visit: Payer: Self-pay

## 2021-10-27 MED ORDER — CYCLOBENZAPRINE HCL 10 MG PO TABS
ORAL_TABLET | ORAL | 3 refills | Status: DC
  Filled 2021-10-27: qty 60, 30d supply, fill #0

## 2021-10-27 MED ORDER — ALPRAZOLAM 1 MG PO TABS
ORAL_TABLET | ORAL | 0 refills | Status: DC
  Filled 2021-10-27: qty 90, 30d supply, fill #0

## 2021-11-01 ENCOUNTER — Other Ambulatory Visit: Payer: Self-pay

## 2021-11-01 MED ORDER — ALPRAZOLAM 0.5 MG PO TABS: ORAL_TABLET | ORAL | 0 refills | Status: DC

## 2021-11-01 MED ORDER — ALPRAZOLAM 0.5 MG PO TABS
ORAL_TABLET | ORAL | 0 refills | Status: DC
  Filled 2021-11-01: qty 2, 2d supply, fill #0

## 2021-11-07 ENCOUNTER — Encounter
Admission: RE | Disposition: A | Discharge status: Home / Self Care | Payer: Self-pay | Source: Ambulatory Visit | Attending: Cardiovascular Disease

## 2021-11-07 ENCOUNTER — Other Ambulatory Visit: Payer: Self-pay

## 2021-11-07 ENCOUNTER — Ambulatory Visit
Admission: RE | Admit: 2021-11-07 | Discharge: 2021-11-07 | Disposition: A | Discharge status: Home / Self Care | Payer: 59 | Source: Ambulatory Visit | Attending: Cardiovascular Disease | Admitting: Cardiovascular Disease

## 2021-11-07 ENCOUNTER — Encounter: Payer: Self-pay | Admitting: Cardiovascular Disease

## 2021-11-07 DIAGNOSIS — I5022 Chronic systolic (congestive) heart failure: Secondary | ICD-10-CM | POA: Insufficient documentation

## 2021-11-07 DIAGNOSIS — M797 Fibromyalgia: Secondary | ICD-10-CM | POA: Diagnosis not present

## 2021-11-07 DIAGNOSIS — I110 Hypertensive heart disease with heart failure: Secondary | ICD-10-CM | POA: Insufficient documentation

## 2021-11-07 DIAGNOSIS — I25118 Atherosclerotic heart disease of native coronary artery with other forms of angina pectoris: Secondary | ICD-10-CM | POA: Diagnosis not present

## 2021-11-07 DIAGNOSIS — F419 Anxiety disorder, unspecified: Secondary | ICD-10-CM | POA: Diagnosis not present

## 2021-11-07 DIAGNOSIS — I2510 Atherosclerotic heart disease of native coronary artery without angina pectoris: Secondary | ICD-10-CM

## 2021-11-07 DIAGNOSIS — E785 Hyperlipidemia, unspecified: Secondary | ICD-10-CM | POA: Insufficient documentation

## 2021-11-07 DIAGNOSIS — E876 Hypokalemia: Secondary | ICD-10-CM | POA: Diagnosis not present

## 2021-11-07 DIAGNOSIS — F32A Depression, unspecified: Secondary | ICD-10-CM | POA: Diagnosis not present

## 2021-11-07 DIAGNOSIS — I252 Old myocardial infarction: Secondary | ICD-10-CM | POA: Diagnosis not present

## 2021-11-07 DIAGNOSIS — Z79899 Other long term (current) drug therapy: Secondary | ICD-10-CM | POA: Insufficient documentation

## 2021-11-07 DIAGNOSIS — Z955 Presence of coronary angioplasty implant and graft: Secondary | ICD-10-CM | POA: Insufficient documentation

## 2021-11-07 DIAGNOSIS — I471 Supraventricular tachycardia: Secondary | ICD-10-CM | POA: Insufficient documentation

## 2021-11-07 DIAGNOSIS — F17210 Nicotine dependence, cigarettes, uncomplicated: Secondary | ICD-10-CM | POA: Diagnosis not present

## 2021-11-07 HISTORY — PX: RIGHT/LEFT HEART CATH AND CORONARY ANGIOGRAPHY: CATH118266

## 2021-11-07 SURGERY — RIGHT/LEFT HEART CATH AND CORONARY ANGIOGRAPHY
Anesthesia: Moderate Sedation

## 2021-11-07 MED ORDER — FUROSEMIDE 40 MG PO TABS
40.0000 mg | ORAL_TABLET | Freq: Every day | ORAL | 6 refills | Status: DC
  Filled 2021-11-07: qty 30, 30d supply, fill #0
  Filled 2021-12-20: qty 30, 30d supply, fill #1

## 2021-11-07 MED ORDER — SODIUM CHLORIDE 0.9 % IV SOLN: INTRAVENOUS | Status: DC

## 2021-11-07 MED ORDER — METOPROLOL SUCCINATE ER 50 MG PO TB24
50.0000 mg | ORAL_TABLET | Freq: Every day | ORAL | 6 refills | Status: DC
  Filled 2021-11-07: qty 30, 30d supply, fill #0
  Filled 2021-12-20: qty 90, 90d supply, fill #1
  Filled 2022-03-10: qty 30, 30d supply, fill #2

## 2021-11-07 MED ORDER — VERAPAMIL HCL 2.5 MG/ML IV SOLN
INTRAVENOUS | Status: DC | PRN
  Administered 2021-11-07: 2.5 mg via INTRAVENOUS

## 2021-11-07 MED ORDER — ONDANSETRON HCL 4 MG/2ML IJ SOLN: 4.0000 mg | Freq: Four times a day (QID) | INTRAMUSCULAR | Status: DC | PRN

## 2021-11-07 MED ORDER — ASPIRIN 81 MG PO CHEW
CHEWABLE_TABLET | ORAL | Status: AC
  Filled 2021-11-07: qty 1

## 2021-11-07 MED ORDER — METHYLPREDNISOLONE SODIUM SUCC 125 MG IJ SOLR
125.0000 mg | Freq: Once | INTRAMUSCULAR | Status: AC
  Administered 2021-11-07: 125 mg via INTRAVENOUS

## 2021-11-07 MED ORDER — ACETAMINOPHEN 325 MG PO TABS: 650.0000 mg | ORAL_TABLET | ORAL | Status: DC | PRN

## 2021-11-07 MED ORDER — FENTANYL CITRATE (PF) 100 MCG/2ML IJ SOLN
INTRAMUSCULAR | Status: AC
  Filled 2021-11-07: qty 2

## 2021-11-07 MED ORDER — SODIUM CHLORIDE 0.9 % IV SOLN
250.0000 mL | INTRAVENOUS | Status: DC | PRN
Start: 2021-11-07 — End: 2021-11-07

## 2021-11-07 MED ORDER — HEPARIN SODIUM (PORCINE) 1000 UNIT/ML IJ SOLN
INTRAMUSCULAR | Status: DC | PRN
  Administered 2021-11-07: 3500 [IU] via INTRAVENOUS

## 2021-11-07 MED ORDER — DIPHENHYDRAMINE HCL 50 MG/ML IJ SOLN
INTRAMUSCULAR | Status: AC
  Administered 2021-11-07: 50 mg
  Filled 2021-11-07: qty 1

## 2021-11-07 MED ORDER — MIDAZOLAM HCL 2 MG/2ML IJ SOLN
INTRAMUSCULAR | Status: DC | PRN
  Administered 2021-11-07 (×2): 1 mg via INTRAVENOUS

## 2021-11-07 MED ORDER — DIPHENHYDRAMINE HCL 50 MG/ML IJ SOLN: 50.0000 mg | Freq: Once | INTRAMUSCULAR | Status: DC

## 2021-11-07 MED ORDER — IOHEXOL 300 MG/ML  SOLN
INTRAMUSCULAR | Status: DC | PRN
  Administered 2021-11-07: 60 mL

## 2021-11-07 MED ORDER — HEPARIN (PORCINE) IN NACL 1000-0.9 UT/500ML-% IV SOLN
INTRAVENOUS | Status: DC | PRN
  Administered 2021-11-07 (×2): 500 mL

## 2021-11-07 MED ORDER — MIDAZOLAM HCL 2 MG/2ML IJ SOLN
INTRAMUSCULAR | Status: AC
  Filled 2021-11-07: qty 2

## 2021-11-07 MED ORDER — LIDOCAINE HCL (PF) 1 % IJ SOLN
INTRAMUSCULAR | Status: DC | PRN
  Administered 2021-11-07 (×2): 2 mL

## 2021-11-07 MED ORDER — LIDOCAINE HCL 1 % IJ SOLN
INTRAMUSCULAR | Status: AC
  Filled 2021-11-07: qty 20

## 2021-11-07 MED ORDER — ASPIRIN 81 MG PO CHEW
81.0000 mg | CHEWABLE_TABLET | ORAL | Status: AC
  Administered 2021-11-07: 81 mg via ORAL

## 2021-11-07 MED ORDER — SODIUM CHLORIDE 0.9 % IV SOLN: 250.0000 mL | INTRAVENOUS | Status: DC | PRN

## 2021-11-07 MED ORDER — HEPARIN (PORCINE) IN NACL 1000-0.9 UT/500ML-% IV SOLN
INTRAVENOUS | Status: AC
  Filled 2021-11-07: qty 1000

## 2021-11-07 MED ORDER — VERAPAMIL HCL 2.5 MG/ML IV SOLN
INTRAVENOUS | Status: AC
  Filled 2021-11-07: qty 2

## 2021-11-07 MED ORDER — SODIUM CHLORIDE 0.9% FLUSH: 3.0000 mL | Freq: Two times a day (BID) | INTRAVENOUS | Status: DC

## 2021-11-07 MED ORDER — SODIUM CHLORIDE 0.9% FLUSH: 3.0000 mL | INTRAVENOUS | Status: DC | PRN

## 2021-11-07 MED ORDER — STERILE WATER FOR INJECTION IJ SOLN
INTRAMUSCULAR | Status: AC
  Filled 2021-11-07: qty 10

## 2021-11-07 MED ORDER — FENTANYL CITRATE (PF) 100 MCG/2ML IJ SOLN
INTRAMUSCULAR | Status: DC | PRN
  Administered 2021-11-07: 25 ug via INTRAVENOUS

## 2021-11-07 MED ORDER — FAMOTIDINE 20 MG PO TABS
40.0000 mg | ORAL_TABLET | Freq: Once | ORAL | Status: AC
  Administered 2021-11-07: 40 mg via ORAL

## 2021-11-07 MED ORDER — METHYLPREDNISOLONE SODIUM SUCC 125 MG IJ SOLR
INTRAMUSCULAR | Status: AC
  Filled 2021-11-07: qty 2

## 2021-11-07 MED ORDER — FAMOTIDINE 20 MG PO TABS
ORAL_TABLET | ORAL | Status: AC
  Filled 2021-11-07: qty 2

## 2021-11-07 MED ORDER — HEPARIN SODIUM (PORCINE) 1000 UNIT/ML IJ SOLN
INTRAMUSCULAR | Status: AC
  Filled 2021-11-07: qty 10

## 2021-11-07 SURGICAL SUPPLY — 14 items
BAND ZEPHYR COMPRESS 30 LONG (HEMOSTASIS) ×1 IMPLANT
CATH 5FR JL3.5 JR4 ANG PIG MP (CATHETERS) ×1 IMPLANT
CATH BALLN WEDGE 5F 110CM (CATHETERS) ×1 IMPLANT
CATH LAUNCHER 5F EBU3.5 (CATHETERS) ×1 IMPLANT
DRAPE BRACHIAL (DRAPES) ×2 IMPLANT
GLIDESHEATH SLEND SS 6F .021 (SHEATH) ×1 IMPLANT
GUIDEWIRE .025 260CM (WIRE) ×1 IMPLANT
GUIDEWIRE INQWIRE 1.5J.035X260 (WIRE) IMPLANT
INQWIRE 1.5J .035X260CM (WIRE) ×2
PACK CARDIAC CATH (CUSTOM PROCEDURE TRAY) ×2 IMPLANT
PROTECTION STATION PRESSURIZED (MISCELLANEOUS) ×2
SET ATX SIMPLICITY (MISCELLANEOUS) ×1 IMPLANT
SHEATH GLIDE SLENDER 4/5FR (SHEATH) ×1 IMPLANT
STATION PROTECTION PRESSURIZED (MISCELLANEOUS) IMPLANT

## 2021-11-07 NOTE — Interval H&P Note (Signed)
History and Physical Interval Note:  11/07/2021 9:55 AM  Lisa Davila  has presented today for surgery, with the diagnosis of RT LT Heart Cath   Chronic systolic heart failure.  The various methods of treatment have been discussed with the patient and family. After consideration of risks, benefits and other options for treatment, the patient has consented to  Procedure(s): RIGHT/LEFT HEART CATH AND CORONARY ANGIOGRAPHY (N/A) as a surgical intervention.  The patient's history has been reviewed, patient examined, no change in status, stable for surgery.  I have reviewed the patient's chart and labs.  Questions were answered to the patient's satisfaction.     Kathlyn Sacramento

## 2021-11-11 ENCOUNTER — Emergency Department
Admission: EM | Admit: 2021-11-11 | Discharge: 2021-11-11 | Disposition: A | Discharge status: Home / Self Care | Payer: 59 | Attending: Emergency Medicine | Admitting: Emergency Medicine

## 2021-11-11 ENCOUNTER — Other Ambulatory Visit
Admission: RE | Admit: 2021-11-11 | Discharge: 2021-11-11 | Disposition: A | Discharge status: Home / Self Care | Payer: 59 | Source: Home / Self Care | Attending: Cardiovascular Disease | Admitting: Cardiovascular Disease

## 2021-11-11 ENCOUNTER — Encounter: Payer: Self-pay | Admitting: Emergency Medicine

## 2021-11-11 ENCOUNTER — Encounter: Payer: Self-pay | Admitting: Cardiovascular Disease

## 2021-11-11 ENCOUNTER — Telehealth: Payer: Self-pay | Admitting: Cardiovascular Disease

## 2021-11-11 DIAGNOSIS — E876 Hypokalemia: Secondary | ICD-10-CM | POA: Diagnosis not present

## 2021-11-11 DIAGNOSIS — I2510 Atherosclerotic heart disease of native coronary artery without angina pectoris: Secondary | ICD-10-CM | POA: Diagnosis not present

## 2021-11-11 DIAGNOSIS — I5022 Chronic systolic (congestive) heart failure: Secondary | ICD-10-CM | POA: Insufficient documentation

## 2021-11-11 DIAGNOSIS — F17200 Nicotine dependence, unspecified, uncomplicated: Secondary | ICD-10-CM | POA: Diagnosis not present

## 2021-11-11 DIAGNOSIS — R9431 Abnormal electrocardiogram [ECG] [EKG]: Secondary | ICD-10-CM

## 2021-11-11 DIAGNOSIS — I4581 Long QT syndrome: Secondary | ICD-10-CM | POA: Diagnosis not present

## 2021-11-11 LAB — CBC WITH DIFFERENTIAL/PLATELET
Abs Immature Granulocytes: 0.04 10*3/uL (ref 0.00–0.07)
Basophils Absolute: 0.1 10*3/uL (ref 0.0–0.1)
Basophils Relative: 1 %
Eosinophils Absolute: 0.4 10*3/uL (ref 0.0–0.5)
Eosinophils Relative: 4 %
HCT: 41.9 % (ref 36.0–46.0)
Hemoglobin: 14.5 g/dL (ref 12.0–15.0)
Immature Granulocytes: 0 %
Lymphocytes Relative: 37 %
Lymphs Abs: 4 10*3/uL (ref 0.7–4.0)
MCH: 31 pg (ref 26.0–34.0)
MCHC: 34.6 g/dL (ref 30.0–36.0)
MCV: 89.5 fL (ref 80.0–100.0)
Monocytes Absolute: 0.6 10*3/uL (ref 0.1–1.0)
Monocytes Relative: 6 %
Neutro Abs: 5.8 10*3/uL (ref 1.7–7.7)
Neutrophils Relative %: 52 %
Platelets: 367 10*3/uL (ref 150–400)
RBC: 4.68 MIL/uL (ref 3.87–5.11)
RDW: 12.4 % (ref 11.5–15.5)
WBC: 11 10*3/uL — ABNORMAL HIGH (ref 4.0–10.5)
nRBC: 0 % (ref 0.0–0.2)

## 2021-11-11 LAB — BASIC METABOLIC PANEL
Anion gap: 9 (ref 5–15)
Anion gap: 6 (ref 5–15)
BUN: <5 mg/dL — ABNORMAL LOW (ref 6–20)
BUN: <5 mg/dL — ABNORMAL LOW (ref 6–20)
CO2: 26 mmol/L (ref 22–32)
CO2: 27 mmol/L (ref 22–32)
Calcium: 9.1 mg/dL (ref 8.9–10.3)
Calcium: 8.7 mg/dL — ABNORMAL LOW (ref 8.9–10.3)
Chloride: 102 mmol/L (ref 98–111)
Chloride: 105 mmol/L (ref 98–111)
Creatinine, Ser: 0.76 mg/dL (ref 0.44–1.00)
Creatinine, Ser: 0.83 mg/dL (ref 0.44–1.00)
GFR, Estimated: >60 mL/min (ref >60)
GFR, Estimated: >60 mL/min (ref >60)
Glucose, Bld: 106 mg/dL — ABNORMAL HIGH (ref 70–99)
Glucose, Bld: 113 mg/dL — ABNORMAL HIGH (ref 70–99)
Potassium: 2.5 mmol/L — CL (ref 3.5–5.1)
Potassium: 3.5 mmol/L (ref 3.5–5.1)
Sodium: 137 mmol/L (ref 135–145)
Sodium: 138 mmol/L (ref 135–145)

## 2021-11-11 LAB — BRAIN NATRIURETIC PEPTIDE: B Natriuretic Peptide: 34.8 pg/mL (ref 0.0–100.0)

## 2021-11-11 LAB — MAGNESIUM: Magnesium: 2.1 mg/dL (ref 1.7–2.4)

## 2021-11-11 MED ORDER — SCOPOLAMINE 1 MG/3DAYS TD PT72
1.0000 | MEDICATED_PATCH | TRANSDERMAL | Status: DC
Start: 2021-11-11 — End: 2021-11-12
  Administered 2021-11-11: 1.5 mg via TRANSDERMAL
  Filled 2021-11-11 (×2): qty 1

## 2021-11-11 MED ORDER — POTASSIUM CHLORIDE 10 MEQ/100ML IV SOLN
10.0000 meq | INTRAVENOUS | Status: DC
  Administered 2021-11-11 (×2): 10 meq via INTRAVENOUS
  Filled 2021-11-11 (×2): qty 100

## 2021-11-11 MED ORDER — POTASSIUM CHLORIDE 10 MEQ/100ML IV SOLN: 10.0000 meq | Freq: Once | INTRAVENOUS | Status: DC

## 2021-11-11 MED ORDER — POTASSIUM CHLORIDE 20 MEQ PO PACK
40.0000 meq | PACK | ORAL | Status: AC
Start: 2021-11-11 — End: 2021-11-11
  Administered 2021-11-11: 40 meq via ORAL
  Filled 2021-11-11: qty 2

## 2021-11-11 MED ORDER — LORAZEPAM 2 MG/ML IJ SOLN
1.0000 mg | Freq: Once | INTRAMUSCULAR | Status: AC
  Administered 2021-11-11: 1 mg via INTRAVENOUS
  Filled 2021-11-11: qty 1

## 2021-11-11 NOTE — Telephone Encounter (Signed)
Received response from [place] to advise patient to go to the ED for IV replacement.

## 2021-11-11 NOTE — ED Provider Triage Note (Signed)
Emergency Medicine Provider Triage Evaluation Note  Lisa Davila, a 46 y.o. female  was evaluated in triage.  Pt complains of low potassium. She presents from Dr. Tyrell Antonio office for abnormal labs. She notes several days of NVD. She has been unable to take her K pills.  Review of Systems  Positive: Weakness, NVD Negative: FCS  Physical Exam  BP 111/73   Pulse (!) 109   Temp 98.9 F (37.2 C) (Oral)   Resp 18   Ht '5\' 1"'$  (1.549 m)   Wt 81.6 kg   SpO2 100%   BMI 34.01 kg/m  Gen:   Awake, no distress  NAD Resp:  Normal effort  MSK:   Moves extremities without difficulty  Other:    Medical Decision Making  Medically screening exam initiated at 4:54 PM.  Appropriate orders placed.  Lisa Davila was informed that the remainder of the evaluation will be completed by another provider, this initial triage assessment does not replace that evaluation, and the importance of remaining in the ED until their evaluation is complete.  Patient to the ED for evaluation of abnormal labs from her cardiology office.  Dr. Fletcher Anon at is on call and asked the patient to report to the ED.   Melvenia Needles, PA-C 11/11/21 1657

## 2021-11-11 NOTE — ED Triage Notes (Signed)
Pt sent from Dr. Tyrell Antonio due to critical low potassium. Pt denies any pain, states she just doesn't feel well. Pt has not taken home potassium past 2 days due to n/v. Pt had heart cath on Monday.

## 2021-11-11 NOTE — Telephone Encounter (Signed)
Nevin Bloodgood from the lab called with critical lab on this patient.

## 2021-11-11 NOTE — ED Notes (Signed)
Per ed md smith  give one dose ivpb potassium and then reassess

## 2021-11-11 NOTE — Telephone Encounter (Signed)
Spoke with Nevin Bloodgood from the lab and received the report of a Critical Potassium of 2.5.  Will secure chat Dr. Fletcher Anon.

## 2021-11-11 NOTE — Telephone Encounter (Addendum)
Patient made aware of lab results with critical K+ 2.5. Patient sts that she has not been feeling well the last 2 days and has not taken her oral potassium.  Patient made aware of Dr. Tyrell Antonio recommendation for her to come to the [facility] ED now for IV potassium replacement. Patient sts that she will head to the ED now.

## 2021-11-11 NOTE — ED Provider Notes (Signed)
[facility] Provider Note    Event Date/Time   First MD Initiated Contact with Patient 11/11/21 1653     (approximate)   History   Abnormal Lab   HPI  Lisa Davila is a 46 y.o. female   with PMH significant for CAD, s/p cath 7/3 (report below), HDL, GERD, anxiety, depression, fibromyalgia, TMJ arthralgia, obesity and tobacco abuse who presents after being referred from cardiology clinic for further management of low potassium seen on labs drawn today.  Patient is on daily Lasix but does not think this is increased her urine output.  She denies any acute shortness of breath, chest pain, change in her chronic cough, acute swelling in her legs only endorsing some fatigue and nausea which she states she had a last couple days which she attributed to her Lasix.  She states he is also had a little bit of diarrhea over the last couple days.  Nonbloody and nonmelanotic    Past Medical History:  Diagnosis Date   Acute ethmoidal sinusitis    Acute ethmoidal sinusitis    Acute maxillary sinusitis    Acute upper respiratory infections of unspecified site    Anxiety state, unspecified    Chest pain, unspecified    Coronary artery disease    Depression    Diarrhea    Esophageal reflux    Generalized anxiety disorder    Hyperlipidemia    Hypoglycemia, unspecified    Lumbago    Nausea with vomiting    Obstructive chronic bronchitis with exacerbation (HCC)    Other and unspecified hyperlipidemia    Other and unspecified peripheral vertigo(386.19)    Other bursitis disorders    Other malaise and fatigue    Other specified diseases due to viruses    Other vitamin B12 deficiency anemia    Pain in limb    Pernicious anemia    Sleep related hypoventilation/hypoxemia in conditions classifiable elsewhere    Swelling, mass, or lump in head and neck    Tobacco use disorder    Unspecified disorder of external ear    Unspecified disorder of skin and subcutaneous  tissue    Unspecified otitis media      Physical Exam  Triage Vital Signs: ED Triage Vitals [11/11/21 1632]  Enc Vitals Group     BP 111/73     Pulse Rate (!) 109     Resp 18     Temp 98.9 F (37.2 C)     Temp Source Oral     SpO2 100 %     Weight 180 lb (81.6 kg)     Height '5\' 1"'$  (1.549 m)     Head Circumference      Peak Flow      Pain Score 0     Pain Loc      Pain Edu?      Excl. in [place]?     Most recent vital signs: Vitals:   11/11/21 1632 11/11/21 1810  BP: 111/73 117/74  Pulse: (!) 109 (!) 105  Resp: 18 17  Temp: 98.9 F (37.2 C) 98.5 F (36.9 C)  SpO2: 100% 98%    General: Awake, no distress.  CV:  Good peripheral perfusion.  2+ radial pulses. Resp:  Normal effort.  Clear bilaterally. Abd:  No distention.  Soft very slightly distended. Other:     ED Results / Procedures / Treatments  Labs (all labs ordered are listed, but only abnormal results are displayed) Labs Reviewed -  No data to display   EKG  EKG is remarkable sinus tachycardia with a ventricular rate of 109 with evidence of a left axis deviation and prolonged QTc interval at 506 with some nonspecific ST change versus artifact in lead II and nonspecific change in lead III and aVF.  No other evidence of acute ischemia.  Patient is known to have  nonspecific changes in inferior leads on ECG from 6/21 as well  RADIOLOGY     PROCEDURES:  Critical Care performed: No  .1-3 Lead EKG Interpretation  Performed by: Lucrezia Starch, MD Authorized by: Lucrezia Starch, MD     Interpretation: normal     ECG rate assessment: normal     Rhythm: sinus rhythm     Ectopy: none     Conduction: normal    The patient is on the cardiac monitor to evaluate for evidence of arrhythmia and/or significant heart rate changes.   MEDICATIONS ORDERED IN ED: Medications  scopolamine (TRANSDERM-SCOP) 1 MG/3DAYS 1.5 mg (1.5 mg Transdermal Not Given 11/11/21 1751)  potassium chloride 10 mEq in 100 mL IVPB  (10 mEq Intravenous New Bag/Given 11/11/21 1733)  LORazepam (ATIVAN) injection 1 mg (1 mg Intravenous Given 11/11/21 1733)  potassium chloride (KLOR-CON) packet 40 mEq (40 mEq Oral Given 11/11/21 1737)     IMPRESSION / MDM / ASSESSMENT AND PLAN / ED COURSE  I reviewed the triage vital signs and the nursing notes. Patient's presentation is most consistent with acute presentation with potential threat to life or bodily function.                               Differential diagnosis includes, but is not limited to low potassium secondary to diuretic use versus from her reported diarrhea last couple days.  I was able to review labs drawn earlier today.  BMP showed a K of 3.5 but no other significant derangements.  Magnesium 2.1.  BNP 34.8.  CBC shows WC count of 11 somewhat nonspecific with normal hemoglobin and platelets.  Given she is a prolonged QTc and is reporting some nausea we will give some Ativan a scopolamine patch.  We will give 40 mEq orally and start her IV repletion.  Recheck shows a K of 3.5 without any other significant derangements.  Patient states she wishes to leave and that her nausea is much better and she feels comfortable she can resume her oral supplementation.  I think this is reasonable.  Discharged in stable condition.  Advised her to follow-up with PCP to have potassium rechecked on Monday.  Discussed returning for any new or worsening of symptoms.  Advised her to keep her scopolamine patch on for 3 days to help with her nausea.      FINAL CLINICAL IMPRESSION(S) / ED DIAGNOSES   Final diagnoses:  Hypokalemia  QT prolongation     Rx / DC Orders   ED Discharge Orders     None        Note:  This document was prepared using Dragon voice recognition software and may include unintentional dictation errors.   Lucrezia Starch, MD 11/11/21 2011

## 2021-11-14 ENCOUNTER — Other Ambulatory Visit: Payer: Self-pay

## 2021-11-14 ENCOUNTER — Encounter: Payer: Self-pay | Admitting: Cardiovascular Disease

## 2021-11-14 DIAGNOSIS — E876 Hypokalemia: Secondary | ICD-10-CM

## 2021-11-15 ENCOUNTER — Other Ambulatory Visit: Payer: Self-pay

## 2021-11-15 MED ORDER — EPLERENONE 25 MG PO TABS
25.0000 mg | ORAL_TABLET | Freq: Every day | ORAL | 2 refills | Status: DC
  Filled 2021-11-15: qty 30, 30d supply, fill #0
  Filled 2021-12-20: qty 30, 30d supply, fill #1
  Filled 2022-02-15: qty 30, 30d supply, fill #2

## 2021-11-15 NOTE — Addendum Note (Signed)
Addended by: Lamar Laundry on: 11/15/2021 08:53 AM   Modules accepted: Orders

## 2021-11-16 ENCOUNTER — Other Ambulatory Visit: Payer: Self-pay

## 2021-11-18 ENCOUNTER — Other Ambulatory Visit
Admission: RE | Admit: 2021-11-18 | Discharge: 2021-11-18 | Disposition: A | Discharge status: Home / Self Care | Payer: 59 | Attending: Cardiovascular Disease | Admitting: Cardiovascular Disease

## 2021-11-18 DIAGNOSIS — E876 Hypokalemia: Secondary | ICD-10-CM | POA: Diagnosis not present

## 2021-11-18 LAB — BASIC METABOLIC PANEL
Anion gap: 7 (ref 5–15)
BUN: <5 mg/dL — ABNORMAL LOW (ref 6–20)
CO2: 25 mmol/L (ref 22–32)
Calcium: 9.1 mg/dL (ref 8.9–10.3)
Chloride: 106 mmol/L (ref 98–111)
Creatinine, Ser: 0.87 mg/dL (ref 0.44–1.00)
GFR, Estimated: >60 mL/min (ref >60)
Glucose, Bld: 95 mg/dL (ref 70–99)
Potassium: 3.9 mmol/L (ref 3.5–5.1)
Sodium: 138 mmol/L (ref 135–145)

## 2021-11-22 ENCOUNTER — Ambulatory Visit (INDEPENDENT_AMBULATORY_CARE_PROVIDER_SITE_OTHER): Payer: 59 | Admitting: Cardiovascular Disease

## 2021-11-22 ENCOUNTER — Encounter: Payer: Self-pay | Admitting: Cardiovascular Disease

## 2021-11-22 VITALS — BP 100/70 | HR 89 | Ht 61.0 in | Wt 174.5 lb

## 2021-11-22 DIAGNOSIS — E785 Hyperlipidemia, unspecified: Secondary | ICD-10-CM

## 2021-11-22 DIAGNOSIS — E876 Hypokalemia: Secondary | ICD-10-CM | POA: Diagnosis not present

## 2021-11-22 DIAGNOSIS — I5022 Chronic systolic (congestive) heart failure: Secondary | ICD-10-CM | POA: Diagnosis not present

## 2021-11-22 DIAGNOSIS — Z720 Tobacco use: Secondary | ICD-10-CM

## 2021-11-22 DIAGNOSIS — I25118 Atherosclerotic heart disease of native coronary artery with other forms of angina pectoris: Secondary | ICD-10-CM

## 2021-11-22 DIAGNOSIS — I471 Supraventricular tachycardia: Secondary | ICD-10-CM

## 2021-11-22 NOTE — Progress Notes (Signed)
Cardiology Office Note   Date:  11/22/2021   ID:  LIBNI FUSARO, DOB [birth year], MRN [id number]  PCP:  Tracie Harrier, MD  Cardiologist:   Kathlyn Sacramento, MD   Chief Complaint  Patient presents with   Other    4 wk f/u c/o sob and fluid retention. Meds reviewed verbally with pt.      History of Present Illness: Lisa Davila is a 46 y.o. female who is here today for a follow-up visit regarding coronary artery disease and chronic systolic heart failure.  She has known history of SVT status post previous EP study with no inducible arrhythmia, essential hypertension, hyperlipidemia, fibromyalgia, tobacco use, anxiety and hypokalemia. She presented in February 2023 with chest pain and was found to have inferior ST elevation.  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  There was mild nonobstructive disease affecting the left coronary arteries.  Echocardiogram showed an EF of 50 to 55%.  Brilinta was switched to clopidogrel due to persistent dyspnea. She had COVID-19 infection in May.  She had recurrent chest pain after that we will multiple emergency room visits. Repeat echocardiogram in April showed an EF of 35%. She had a MUGA scan earlier this month which showed an EF of 35%.   She cut down on tobacco use to 2 to 3 cigarettes a day.  She works as a Chartered certified accountant at [employer] but has not been able to resume her work since she had myocardial infarction.  Due to a drop in her ejection fraction and her symptoms, I proceeded with a right and left cardiac catheterization earlier this month.  RCA stent was patent with mild in-stent restenosis and no evidence of obstructive disease affecting the left coronary arteries.  Ejection fraction was 35%.  Right heart catheterization showed mildly to moderately elevated filling pressures,  minimal pulmonary hypertension and normal cardiac output.  I increase the dose of furosemide to 40 mg once daily and added eplerenone especially in the setting of chronic hypokalemia.  In addition, I increased Toprol to 50 mg once daily given sinus tachycardia.  She reports significant improvement and palpitations and shortness of breath.  No chest pain.  She continues to complain of abdominal bloating.  No significant lower extremity edema.  Past Medical History:  Diagnosis Date   Acute ethmoidal sinusitis    Acute ethmoidal sinusitis    Acute maxillary sinusitis    Acute upper respiratory infections of unspecified site    Anxiety state, unspecified    Chest pain, unspecified    Coronary artery disease    Depression    Diarrhea    Esophageal reflux    Generalized anxiety disorder    Hyperlipidemia    Hypoglycemia, unspecified    Lumbago    Nausea with vomiting    Obstructive chronic bronchitis with exacerbation (HCC)    Other and unspecified hyperlipidemia    Other and unspecified peripheral vertigo(386.19)    Other bursitis disorders    Other malaise and fatigue    Other specified diseases due to viruses    Other vitamin B12 deficiency anemia    Pain in limb    Pernicious anemia    Sleep related hypoventilation/hypoxemia in conditions classifiable elsewhere    Swelling, mass, or lump in head and neck    Tobacco use disorder    Unspecified disorder of external ear  Unspecified disorder of skin and subcutaneous tissue    Unspecified otitis media     Past Surgical History:  Procedure Laterality Date   CESAREAN SECTION     CORONARY/GRAFT ACUTE MI REVASCULARIZATION N/A 07/04/2021   Procedure: Coronary/Graft Acute MI Revascularization;  Surgeon: Wellington Hampshire, MD;  Location: [facility];  Service: Cardiovascular;  Laterality: N/A;   LEFT HEART CATH AND CORONARY ANGIOGRAPHY N/A 07/04/2021   Procedure: LEFT HEART CATH AND CORONARY ANGIOGRAPHY;  Surgeon: Wellington Hampshire, MD;  Location: [facility];  Service: Cardiovascular;  Laterality: N/A;   RIGHT/LEFT HEART CATH AND CORONARY ANGIOGRAPHY N/A 11/07/2021   Procedure: RIGHT/LEFT HEART CATH AND CORONARY ANGIOGRAPHY;  Surgeon: Wellington Hampshire, MD;  Location: [facility];  Service: Cardiovascular;  Laterality: N/A;     Current Outpatient Medications  Medication Sig Dispense Refill   albuterol (VENTOLIN HFA) 108 (90 Base) MCG/ACT inhaler Inhale 2 puffs into the lungs every 6 hours as needed for wheezing. 18 g 5   ALPRAZolam (XANAX) 1 MG tablet Take 1 tablet (1 mg total) by mouth 3 (three) times daily as needed for Sleep for up to 30 days 90 tablet 0   aspirin 81 MG chewable tablet Chew 1 tablet (81 mg total) by mouth daily.     atorvastatin (LIPITOR) 80 MG tablet Take 1 tablet (80 mg total) by mouth once daily 90 tablet 3   clopidogrel (PLAVIX) 75 MG tablet Take 1 tablet (75 mg total) by mouth daily. 30 tablet 10   cyclobenzaprine (FLEXERIL) 10 MG tablet Take 10 mg by mouth 2 (two) times daily as needed.     eplerenone (INSPRA) 25 MG tablet Take 1 tablet (25 mg total) by mouth daily. 30 tablet 2   ergocalciferol (VITAMIN D2) 1.25 MG (50000 UT) capsule Take one capsule by mouth once a week 12 capsule 1   furosemide (LASIX) 40 MG tablet Take 1 tablet (40 mg total) by mouth daily. 30 tablet 6   ipratropium-albuterol (DUONEB) 0.5-2.5 (3) MG/3ML SOLN Take 3 mls by nebulization 4 (four) times daily for 30 days 360 mL 1   metoprolol succinate (TOPROL-XL) 50 MG 24 hr tablet Take 1 tablet (50 mg total) by mouth daily. 30 tablet 6   nitroGLYCERIN (NITROSTAT) 0.4 MG SL tablet Place 1 tablet (0.4 mg total) under the tongue every 5 (five) minutes as needed for chest pain. 50 tablet 0   potassium chloride (KLOR-CON) 10 MEQ tablet Take 4 tablets (40 mEq total) by mouth two (2) times a day. (Patient taking differently: Take 40 mEq by mouth daily.) 300 tablet 0   nicotine (NICODERM CQ - DOSED IN MG/24  HOURS) 14 mg/24hr patch Place 1 patch onto the skin daily for 14 days (Patient not taking: Reported on 10/26/2021) 14 patch 0   No current facility-administered medications for this visit.    Allergies:   Buspirone, Citalopram, Metrizamide, Sulfa antibiotics, Contrast media [iodinated contrast media], Sulfa drugs cross reactors, and Sulfasalazine    Social History:  The patient  reports that she has been smoking cigarettes. She has a 15.00 pack-year smoking history. She has never used smokeless tobacco. She reports that she does not drink alcohol and does not use drugs.   Family History:  The patient's family history includes Breast cancer in her maternal grandmother; Cancer in her father and paternal grandfather; Depression in her mother; Hypertension in her sister.    ROS:  Please see the history of present illness.  Otherwise, review of systems are positive for none.   All other systems are reviewed and negative.    PHYSICAL EXAM: VS:  BP 100/70 (BP Location: Left Arm, Patient Position: Sitting, Cuff Size: Normal)   Pulse 89   Ht '5\' 1"'$  (1.549 m)   Wt 174 lb 8 oz (79.2 kg)   SpO2 94%   BMI 32.97 kg/m  , BMI Body mass index is 32.97 kg/m. GEN: Well nourished, well developed, in no acute distress  HEENT: normal  Neck: no JVD, carotid bruits, or masses Cardiac: RRR; no murmurs, rubs, or gallops,no edema  Respiratory:  clear to auscultation bilaterally, normal work of breathing GI: soft, nontender, nondistended, + BS MS: no deformity or atrophy  Skin: warm and dry, no rash Neuro:  Strength and sensation are intact Psych: euthymic mood, full affect Right radial pulse: normal with no hematoma.   EKG:  EKG is ordered today. The ekg ordered today demonstrates normal sinus rhythm with low voltage and old inferior infarct.   Recent Labs: 09/05/2021: ALT 19 11/11/2021: B Natriuretic Peptide 34.8; Hemoglobin 14.5; Magnesium 2.1; Platelets 367 11/18/2021: BUN <5; Creatinine, Ser 0.87;  Potassium 3.9; Sodium 138    Lipid Panel    Component Value Date/Time   CHOL 276 (H) 07/04/2021 0900   CHOL 280 (H) 11/29/2011 1326   TRIG 183 (H) 07/04/2021 0900   TRIG 186 11/29/2011 1326   HDL 33 (L) 07/04/2021 0900   HDL 29 (L) 11/29/2011 1326   CHOLHDL 8.4 07/04/2021 0900   VLDL 37 07/04/2021 0900   VLDL 37 11/29/2011 1326   LDLCALC NOT CALCULATED 07/04/2021 0900   LDLCALC 214 (H) 11/29/2011 1326      Wt Readings from Last 3 Encounters:  11/22/21 174 lb 8 oz (79.2 kg)  11/11/21 180 lb (81.6 kg)  11/07/21 170 lb (77.1 kg)          10/26/2021    3:07 PM  PAD Screen  Previous PAD dx? No  Previous surgical procedure? No  Pain with walking? No  Feet/toe relief with dangling? No  Painful, non-healing ulcers? No  Extremities discolored? No      ASSESSMENT AND PLAN:  1.  Coronary artery disease involving native coronary arteries other forms of angina: Recent cardiac catheterization showed patent RCA stent with mild in-stent restenosis.  Continue dual antiplatelet therapy and cardiac medications.  2.  Chronic systolic heart failure: Ejection fraction postmyocardial infarction was 50 to 55%.  However, most recent ejection fraction was 35% by MUGA scan and was confirmed by left ventricular angiography.  Right heart catheterization showed mildly to moderately elevated filling pressures with no significant pulmonary hypertension.  Since then, I increased Lasix to 40 mg once daily and added eplerenone 25 mg once daily given history of hypokalemia.  In addition, Toprol was increased to 50 mg once daily due to significant resting tachycardia.  Since then, the patient had significant symptomatic improvement.  Clinically, she does not appear to be significantly volume overloaded.  I am not entirely clear if the abdominal bloating is related to fluid overload or not. Her blood pressure is low and does not allow the addition of Entresto.  We will consider adding Farxiga upon  follow-up.  3.  Paroxysmal supraventricular tachycardia: She is mostly having issues with sinus tachycardia at the present time.  Stable on Toprol.  4.  Hyperlipidemia: Continue treatment with high-dose atorvastatin.  I requested a follow-up lipid and liver profile.  5.  Tobacco use: She cut down  to 2 to 3 cigarettes daily.  She reports difficulty in quitting due to known history of anxiety and depression.  6.  Hypokalemia: Continue potassium replacement and will consider adding eplerenone.  7.  Severe anxiety: I asked her to discuss with Dr. Ginette Pitman about the possibility of starting an SSRI to help with anxiety in order to cut down on Xanax use.  8.  She is scheduled to go back to work on August 1.  If she continues to struggle with heart failure symptoms and anxiety, that might need to be postponed.  In the meanwhile, I asked her to start a walking exercise program to improve her conditioning.   Disposition: Follow-up in 1 month.  Signed,  Kathlyn Sacramento, MD  11/22/2021 5:21 PM    [place]

## 2021-11-22 NOTE — Patient Instructions (Signed)
Medication Instructions:  Your physician recommends that you continue on your current medications as directed. Please refer to the Current Medication list given to you today.  *If you need a refill on your cardiac medications before your next appointment, please call your pharmacy*   Lab Work: Your physician recommends that you return for a FASTING lipid profile and cmp:   Please have your labs drawn at the [facility]. No appt needed. Lab hours are Mon-Fri 7am-6pm   If you have labs (blood work) drawn today and your tests are completely normal, you will receive your results only by: [place] (if you have MyChart) OR A paper copy in the mail If you have any lab test that is abnormal or we need to change your treatment, we will call you to review the results.   Testing/Procedures: None ordered   Follow-Up: At [facility], you and your health needs are our priority.  As part of our continuing mission to provide you with exceptional heart care, we have created designated Provider Care Teams.  These Care Teams include your primary Cardiologist (physician) and Advanced Practice Providers (APPs -  Physician Assistants and Nurse Practitioners) who all work together to provide you with the care you need, when you need it.  We recommend signing up for the patient portal called "MyChart".  Sign up information is provided on this After Visit Summary.  MyChart is used to connect with patients for Virtual Visits (Telemedicine).  Patients are able to view lab/test results, encounter notes, upcoming appointments, etc.  Non-urgent messages can be sent to your provider as well.   To learn more about what you can do with MyChart, go to [email or web address].    Your next appointment:   4 week(s)  The format for your next appointment:   In Person  Provider:   You may see Kathlyn Sacramento, MD or one of the following Advanced Practice Providers on your designated Care Team:    Murray Hodgkins, NP Christell Faith, PA-C Cadence Kathlen Mody, [place]   Other Instructions N/A  Important Information About Sugar

## 2021-11-30 ENCOUNTER — Other Ambulatory Visit: Payer: Self-pay

## 2021-11-30 MED ORDER — ALPRAZOLAM 0.5 MG PO TABS
ORAL_TABLET | ORAL | 2 refills | Status: DC
  Filled 2021-11-30: qty 30, 30d supply, fill #0

## 2021-12-01 ENCOUNTER — Ambulatory Visit: Payer: 59 | Admitting: Cardiology

## 2021-12-05 ENCOUNTER — Other Ambulatory Visit: Payer: Self-pay

## 2021-12-06 ENCOUNTER — Other Ambulatory Visit: Payer: Self-pay

## 2021-12-06 DIAGNOSIS — Z720 Tobacco use: Secondary | ICD-10-CM | POA: Diagnosis not present

## 2021-12-06 DIAGNOSIS — I252 Old myocardial infarction: Secondary | ICD-10-CM | POA: Diagnosis not present

## 2021-12-06 DIAGNOSIS — F410 Panic disorder [episodic paroxysmal anxiety] without agoraphobia: Secondary | ICD-10-CM | POA: Diagnosis not present

## 2021-12-06 DIAGNOSIS — E876 Hypokalemia: Secondary | ICD-10-CM | POA: Diagnosis not present

## 2021-12-06 DIAGNOSIS — Z6833 Body mass index (BMI) 33.0-33.9, adult: Secondary | ICD-10-CM | POA: Diagnosis not present

## 2021-12-06 DIAGNOSIS — I2510 Atherosclerotic heart disease of native coronary artery without angina pectoris: Secondary | ICD-10-CM | POA: Diagnosis not present

## 2021-12-06 MED ORDER — NICOTINE 7 MG/24HR TD PT24
MEDICATED_PATCH | TRANSDERMAL | 0 refills | Status: DC
Start: 2021-12-06 — End: 2022-01-03
  Filled 2021-12-06: qty 14, 14d supply, fill #0

## 2021-12-06 MED ORDER — DULOXETINE HCL 30 MG PO CPEP
ORAL_CAPSULE | ORAL | 1 refills | Status: DC
  Filled 2021-12-06: qty 90, 90d supply, fill #0

## 2021-12-06 MED ORDER — ALPRAZOLAM 0.5 MG PO TABS
ORAL_TABLET | ORAL | 1 refills | Status: DC
  Filled 2021-12-09 – 2021-12-12 (×2): qty 90, 30d supply, fill #0
  Filled 2022-01-17: qty 90, 30d supply, fill #1

## 2021-12-06 MED ORDER — NICOTINE 14 MG/24HR TD PT24
MEDICATED_PATCH | TRANSDERMAL | 0 refills | Status: DC
  Filled 2021-12-06: qty 14, 14d supply, fill #0

## 2021-12-09 ENCOUNTER — Other Ambulatory Visit: Payer: Self-pay

## 2021-12-12 ENCOUNTER — Other Ambulatory Visit: Payer: Self-pay

## 2021-12-13 ENCOUNTER — Other Ambulatory Visit: Payer: Self-pay

## 2021-12-13 MED ORDER — ALBUTEROL SULFATE HFA 108 (90 BASE) MCG/ACT IN AERS
INHALATION_SPRAY | RESPIRATORY_TRACT | 5 refills | Status: DC
  Filled 2021-12-13: qty 18, 25d supply, fill #0
  Filled 2022-02-15: qty 6.7, 25d supply, fill #1
  Filled 2022-02-15: qty 18, 25d supply, fill #1
  Filled 2022-03-10 – 2022-04-12 (×2): qty 6.7, 25d supply, fill #2
  Filled 2022-06-12: qty 6.7, 25d supply, fill #3
  Filled 2022-08-10: qty 6.7, 25d supply, fill #4
  Filled 2022-09-12 – 2022-10-10 (×2): qty 6.7, 25d supply, fill #5
  Filled 2022-10-19 – 2022-11-13 (×2): qty 18, 25d supply, fill #5

## 2021-12-15 ENCOUNTER — Encounter: Payer: Self-pay | Admitting: Cardiovascular Disease

## 2021-12-15 DIAGNOSIS — R002 Palpitations: Secondary | ICD-10-CM

## 2021-12-16 ENCOUNTER — Ambulatory Visit (INDEPENDENT_AMBULATORY_CARE_PROVIDER_SITE_OTHER): Payer: 59

## 2021-12-16 DIAGNOSIS — R002 Palpitations: Secondary | ICD-10-CM

## 2021-12-20 ENCOUNTER — Other Ambulatory Visit: Payer: Self-pay

## 2021-12-20 NOTE — Progress Notes (Unsigned)
Cardiology Clinic Note   Patient Name: Lisa Davila Date of Encounter: 12/22/2021  Primary Care Provider:  Tracie Harrier, MD Primary Cardiologist:  Kathlyn Sacramento, MD  Patient Profile    46 year old female with past medical history of coronary artery disease, chronic systolic heart failure , SVT status post ablation 09/10/2019, hyperlipidemia, fibromyalgia, anxiety and depression, obesity, and tobacco use, who is here today to follow-up on her CAD.  Past Medical History    Past Medical History:  Diagnosis Date   Acute ethmoidal sinusitis    Acute ethmoidal sinusitis    Acute maxillary sinusitis    Acute upper respiratory infections of unspecified site    Anxiety state, unspecified    Chest pain, unspecified    Coronary artery disease    Depression    Diarrhea    Esophageal reflux    Generalized anxiety disorder    Hyperlipidemia    Hypoglycemia, unspecified    Lumbago    Nausea with vomiting    Obstructive chronic bronchitis with exacerbation (HCC)    Other and unspecified hyperlipidemia    Other and unspecified peripheral vertigo(386.19)    Other bursitis disorders    Other malaise and fatigue    Other specified diseases due to viruses    Other vitamin B12 deficiency anemia    Pain in limb    Pernicious anemia    Sleep related hypoventilation/hypoxemia in conditions classifiable elsewhere    Swelling, mass, or lump in head and neck    Tobacco use disorder    Unspecified disorder of external ear    Unspecified disorder of skin and subcutaneous tissue    Unspecified otitis media    Past Surgical History:  Procedure Laterality Date   CESAREAN SECTION     CORONARY/GRAFT ACUTE MI REVASCULARIZATION N/A 07/04/2021   Procedure: Coronary/Graft Acute MI Revascularization;  Surgeon: Wellington Hampshire, MD;  Location: [facility];  Service: Cardiovascular;  Laterality: N/A;   LEFT HEART CATH AND CORONARY ANGIOGRAPHY N/A 07/04/2021   Procedure: LEFT HEART  CATH AND CORONARY ANGIOGRAPHY;  Surgeon: Wellington Hampshire, MD;  Location: [facility];  Service: Cardiovascular;  Laterality: N/A;   RIGHT/LEFT HEART CATH AND CORONARY ANGIOGRAPHY N/A 11/07/2021   Procedure: RIGHT/LEFT HEART CATH AND CORONARY ANGIOGRAPHY;  Surgeon: Wellington Hampshire, MD;  Location: [facility];  Service: Cardiovascular;  Laterality: N/A;    Allergies  Allergies  Allergen Reactions   Buspirone     Other reaction(s): Other (See Comments), Other (See Comments) Gi UPSET Gi UPSET Gi UPSET Other reaction(s): Other (See Comments) Gi UPSET Gi UPSET    Citalopram Nausea Only   Metrizamide Hives   Sulfa Antibiotics Hives   Contrast Media [Iodinated Contrast Media] Hives   Sulfa Drugs Cross Reactors    Sulfasalazine Other (See Comments)    History of Present Illness    46 year old female with past medical history of coronary artery disease, chronic systolic heart failure, SVT status post ablation 09/10/2019, hyperlipidemia, fibromyalgia, anxiety depression, obesity, and tobacco use.  She presented to [facility] emergency department in February 2023 with chest pain was found to have inferior ST elevated myocardial infarction.  Emergent cardiac catheterization was performed which showed thrombotic subtotal occlusion of the proximal right coronary artery which was treated successfully with PCI and DES placement.  There was mild nonobstructive disease affecting the left coronary arteries.  Echocardiogram showed an EF of 50 to 55%.  Brilinta was switched to clopidogrel due to persistent  and dyspnea.  Unfortunately she had a COVID-19 infection in May.  Had recurrent chest pain after that with multiple emergency department visits.  Repeat echocardiogram in April showed an EF of 35%.  She had a MUGA scan earlier in the month which also revealed an EF of 35%.  Due to drop in her ejection fraction and her symptoms she underwent an elective right and left cardiac catheterization.   The RCA stent was patent with mild in-stent restenosis with no evidence of obstructive disease affecting the left coronary arteries.  Ejection fraction again was noted to be 35%.  Right heart catheterization showed mildly to moderately elevated filling pressures, minimal pulmonary hypertension and normal cardiac output. She continued to have soft blood pressures which prevented the addition of Entresto.   She returns to clinic today to follow-up on her coronary artery disease.  Unfortunately she states that she has been having worsening shortness of breath.  She did return to work and has noticed that she is unable to walk down the hall with having to stop to recover.  She states that with her shortness of breath she gets fatigued and started to have chest pressure and her anxiety flares.  She has split her shifts up and thinks working 12-hour shifts is a little too much and thinks that if she were able to work 8-hour shifts that that would help with some of the shortness of breath, fatigue, and anxiety.  She denies any hospitalizations or visits to the emergency department since the last time she was seen in clinic.  Home Medications    Current Outpatient Medications  Medication Sig Dispense Refill   albuterol (VENTOLIN HFA) 108 (90 Base) MCG/ACT inhaler Inhale 2 puffs into the lungs every 6 hours as needed for wheezing. 18 g 5   ALPRAZolam (XANAX) 1 MG tablet Take 1 tablet (1 mg total) by mouth 3 (three) times daily as needed for Sleep for up to 30 days 90 tablet 0   aspirin 81 MG chewable tablet Chew 1 tablet (81 mg total) by mouth daily.     atorvastatin (LIPITOR) 80 MG tablet Take 1 tablet (80 mg total) by mouth once daily 90 tablet 3   Cholecalciferol 1.25 MG (50000 UT) TABS Take 1,000 mg by mouth once a week.     clopidogrel (PLAVIX) 75 MG tablet Take 1 tablet (75 mg total) by mouth daily. 30 tablet 10   cyclobenzaprine (FLEXERIL) 10 MG tablet Take 10 mg by mouth 2 (two) times daily as needed.      dapagliflozin propanediol (FARXIGA) 10 MG TABS tablet Take 1 tablet (10 mg total) by mouth daily before breakfast. 30 tablet 3   DULoxetine (CYMBALTA) 30 MG capsule Take 1 capsule (30 mg total) by mouth once daily 90 capsule 1   eplerenone (INSPRA) 25 MG tablet Take 1 tablet (25 mg total) by mouth daily. 30 tablet 2   ergocalciferol (VITAMIN D2) 1.25 MG (50000 UT) capsule Take one capsule by mouth once a week 12 capsule 1   furosemide (LASIX) 40 MG tablet Take 1 tablet (40 mg total) by mouth daily. 30 tablet 6   ipratropium-albuterol (DUONEB) 0.5-2.5 (3) MG/3ML SOLN Take 3 mls by nebulization 4 (four) times daily for 30 days 360 mL 1   metoprolol succinate (TOPROL-XL) 50 MG 24 hr tablet Take 1 tablet (50 mg total) by mouth daily. 30 tablet 6   nicotine (NICODERM CQ - DOSED IN MG/24 HOURS) 14 mg/24hr patch Place 1 patch onto the skin  daily for 14 days 14 patch 0   nitroGLYCERIN (NITROSTAT) 0.4 MG SL tablet Place 1 tablet (0.4 mg total) under the tongue every 5 (five) minutes as needed for chest pain. 50 tablet 0   potassium chloride (KLOR-CON) 10 MEQ tablet Take 4 tablets (40 mEq total) by mouth two (2) times a day. (Patient taking differently: Take 40 mEq by mouth daily.) 300 tablet 0   ALPRAZolam (XANAX) 0.5 MG tablet Take 1 tablet (0.5 mg total) by mouth 3 (three) times daily as needed for Sleep for up to 30 days (Patient not taking: Reported on 12/22/2021) 90 tablet 1   nicotine (NICODERM CQ - DOSED IN MG/24 HOURS) 14 mg/24hr patch Place 1 patch onto the skin daily for 14 days (Patient not taking: Reported on 12/22/2021) 14 patch 0   nicotine (NICODERM CQ - DOSED IN MG/24 HR) 7 mg/24hr patch Place 1 patch onto the skin daily for 14 days (Patient not taking: Reported on 12/22/2021) 14 patch 0   No current facility-administered medications for this visit.     Family History    Family History  Problem Relation Age of Onset   Depression Mother    Cancer Father        Father   Hypertension  Sister    Breast cancer Maternal Grandmother    Cancer Paternal Grandfather        Breast    She indicated that her mother is alive. She indicated that her father is deceased. She indicated that the status of her sister is unknown. She indicated that the status of her maternal grandmother is unknown. She indicated that the status of her paternal grandfather is unknown.  Social History    Social History   Socioeconomic History   Marital status: Single    Spouse name: Not on file   Number of children: Not on file   Years of education: Not on file   Highest education level: Not on file  Occupational History   Not on file  Tobacco Use   Smoking status: Every Day    Years: 30.00    Types: Cigarettes   Smokeless tobacco: Never   Tobacco comments:    Has been smoking for 30 years Wants to quit after she gets past all the heart stuff and changes in life.  Smoking 3 a day right now  Vaping Use   Vaping Use: Never used  Substance and Sexual Activity   Alcohol use: No   Drug use: Never   Sexual activity: Not Currently  Other Topics Concern   Not on file  Social History Narrative   Lives with fiance, Juluis Rainier.   Social Determinants of Health   Financial Resource Strain: Not on file  Food Insecurity: Not on file  Transportation Needs: Not on file  Physical Activity: Not on file  Stress: Not on file  Social Connections: Not on file  Intimate Partner Violence: Not on file     Review of Systems    General:  No chills, fever, night sweats or weight changes.  Endorses fatigue Cardiovascular:  No chest pain, endorses chest pressure and tightness with anxiety, endorses worsening dyspnea on exertion, edema, orthopnea, endorses palpitations, paroxysmal nocturnal dyspnea.  Endorses fluid retention in her abdomen Dermatological: No rash, lesions/masses Respiratory: No cough, endorses worsening exertional dyspnea Urologic: No hematuria, dysuria Abdominal:   No nausea, vomiting, diarrhea,  bright red blood per rectum, melena, or hematemesis, endorses bloating Neurologic:  No visual changes, wkns, changes in mental status.  All other systems reviewed and are otherwise negative except as noted above.     Physical Exam    VS:  BP 102/74 (BP Location: Left Arm, Patient Position: Sitting, Cuff Size: Normal)   Pulse 95   Ht '5\' 1"'$  (1.549 m)   Wt 173 lb 9.6 oz (78.7 kg)   SpO2 97%   BMI 32.80 kg/m  , BMI Body mass index is 32.8 kg/m.     GEN: Well nourished, well developed, in no acute distress. HEENT: normal. Neck: Supple, no JVD, carotid bruits, or masses. Cardiac: RRR, no murmurs, rubs, or gallops. No clubbing, cyanosis, edema.  Radials/DP/PT 2+ and equal bilaterally.  Respiratory:  Respirations regular and unlabored, clear to auscultation bilaterally. GI: Soft, nontender, distended, BS + x 4. MS: no deformity or atrophy. Skin: warm and dry, no rash. Neuro:  Strength and sensation are intact. Psych: Normal affect.  Accessory Clinical Findings    ECG personally reviewed by me today-sinus rhythm with a rate of 95, left axis deviation, anterior lateral T WI- No acute changes  Lab Results  Component Value Date   WBC 11.0 (H) 11/11/2021   HGB 14.5 11/11/2021   HCT 41.9 11/11/2021   MCV 89.5 11/11/2021   PLT 367 11/11/2021   Lab Results  Component Value Date   CREATININE 0.86 12/22/2021   BUN <5 (L) 12/22/2021   NA 140 12/22/2021   K 3.3 (L) 12/22/2021   CL 106 12/22/2021   CO2 26 12/22/2021   Lab Results  Component Value Date   ALT 19 09/05/2021   AST 18 09/05/2021   ALKPHOS 85 09/05/2021   BILITOT 0.6 09/05/2021   Lab Results  Component Value Date   CHOL 276 (H) 07/04/2021   HDL 33 (L) 07/04/2021   LDLCALC NOT CALCULATED 07/04/2021   TRIG 183 (H) 07/04/2021   CHOLHDL 8.4 07/04/2021    Lab Results  Component Value Date   HGBA1C 5.3 07/04/2021    Assessment & Plan   1.  Coronary artery disease involving native coronary arteries with other  forms of angina.  She did have recent cardiac catheterization that showed patent RCA stent with mild in-stent restenosis.  Of note she was also unable to complete cardiac rehab as she stated that due to the shortness of breath and symptoms that she was having and she had the inability to participate to her potential and did not return. She does complain of chest tightness and pressure today with anxiety and worsening shortness of breath.  She is can continue on dual antiplatelet therapy of aspirin and Plavix.    2.  HFrEF with a last LVEF of 35% by MUGA scan and was confirmed by left ventricular angiography.  Right heart catheterization showed mildly to moderately elevated filling pressures with no significant pulmonary hypertension.  She continues to have shortness of breath and feels as though she has abdominal bloating and is related to fluid overload.  She has been started on Farxiga today, with prescription being sent into the pharmacy of her choice.  We also sent her for BNP, BMP and a D-dimer with a continued shortness of breath.  She is to continue with eplerenone 25 mg daily, furosemide 40 mg daily, and metoprolol XL 50 mg daily. Blood pressure remains a little too soft to start or initiate Entresto at this time.  We will revisit at return appointment.  We did also discuss potentially sending her for pulmonary function studies to evaluate some of her shortness of breath.  3.  Paroxysmal supraventricular tachycardia today she has a heart rate of 95 on her EKG.  She is also awaiting a ZIO monitor to determine any arrhythmia she was concerned about atrial fibrillation today and was informed that she was not in A-fib.  She is continue with Toprol-XL 50 mg daily.  4.  Hyperlipidemia her LDL was unable to be calculated 07/04/2021.  She was requested to have repeat blood work done in July of the lipid panel and has not had that done as of yet.  She is to continue on atorvastatin 80 mg daily.  We will request  a repeat lipid panel when she returns at her follow-up appointment.  5.  Hypokalemia with potassium of 3.9 on 11/18/2021.  She is to continue with potassium supplements as previously ordered.  We will do repeat BMP today.  6.  Tobacco abuse she continues to try to decrease the amount that she is smoking which has slowly improved, but her anxiety exacerbates the situation.  Is to continue to work on her cessation efforts.  She states that she continues to do nicotine patches only while at work.  7.  Severe anxiety and she is tearful today during the end of her exam.  She is requesting to decrease the amount of hours that she has to work in the day due to the symptoms that she has been having with the worsening progressive shortness of breath.  SSRIs may be beneficial in helping her decrease the amount of Xanax that she needs and to offset the extreme anxiety that she suffers from.  As this is followed by her PCP.  8.  Disposition patient to return to clinic to see MD/APP in 2 weeks to reevaluate recent starting of Farxiga, symptoms, she will also need to be sent for follow-up lipid panel and LFT.   Adler Chartrand, NP 12/22/2021, 1:27 PM

## 2021-12-21 ENCOUNTER — Other Ambulatory Visit: Payer: Self-pay

## 2021-12-22 ENCOUNTER — Ambulatory Visit (INDEPENDENT_AMBULATORY_CARE_PROVIDER_SITE_OTHER): Payer: 59 | Admitting: Cardiology

## 2021-12-22 ENCOUNTER — Encounter: Payer: Self-pay | Admitting: Cardiology

## 2021-12-22 ENCOUNTER — Other Ambulatory Visit
Admission: RE | Admit: 2021-12-22 | Discharge: 2021-12-22 | Disposition: A | Discharge status: Home / Self Care | Payer: 59 | Attending: Cardiology | Admitting: Cardiology

## 2021-12-22 ENCOUNTER — Other Ambulatory Visit: Payer: Self-pay

## 2021-12-22 VITALS — BP 102/74 | HR 95 | Ht 61.0 in | Wt 173.6 lb

## 2021-12-22 DIAGNOSIS — I471 Supraventricular tachycardia: Secondary | ICD-10-CM | POA: Diagnosis not present

## 2021-12-22 DIAGNOSIS — E876 Hypokalemia: Secondary | ICD-10-CM

## 2021-12-22 DIAGNOSIS — F419 Anxiety disorder, unspecified: Secondary | ICD-10-CM | POA: Diagnosis not present

## 2021-12-22 DIAGNOSIS — Z720 Tobacco use: Secondary | ICD-10-CM | POA: Diagnosis not present

## 2021-12-22 DIAGNOSIS — E785 Hyperlipidemia, unspecified: Secondary | ICD-10-CM | POA: Diagnosis not present

## 2021-12-22 DIAGNOSIS — R0602 Shortness of breath: Secondary | ICD-10-CM

## 2021-12-22 DIAGNOSIS — I5022 Chronic systolic (congestive) heart failure: Secondary | ICD-10-CM

## 2021-12-22 DIAGNOSIS — I25118 Atherosclerotic heart disease of native coronary artery with other forms of angina pectoris: Secondary | ICD-10-CM

## 2021-12-22 LAB — BASIC METABOLIC PANEL
Anion gap: 8 (ref 5–15)
BUN: <5 mg/dL — ABNORMAL LOW (ref 6–20)
CO2: 26 mmol/L (ref 22–32)
Calcium: 9.3 mg/dL (ref 8.9–10.3)
Chloride: 106 mmol/L (ref 98–111)
Creatinine, Ser: 0.86 mg/dL (ref 0.44–1.00)
GFR, Estimated: >60 mL/min (ref >60)
Glucose, Bld: 108 mg/dL — ABNORMAL HIGH (ref 70–99)
Potassium: 3.3 mmol/L — ABNORMAL LOW (ref 3.5–5.1)
Sodium: 140 mmol/L (ref 135–145)

## 2021-12-22 LAB — D-DIMER, QUANTITATIVE: D-Dimer, Quant: 0.42 ug/mL-FEU (ref 0.00–0.50)

## 2021-12-22 LAB — BRAIN NATRIURETIC PEPTIDE: B Natriuretic Peptide: 70.5 pg/mL (ref 0.0–100.0)

## 2021-12-22 MED ORDER — DAPAGLIFLOZIN PROPANEDIOL 10 MG PO TABS
10.0000 mg | ORAL_TABLET | Freq: Every day | ORAL | 3 refills | Status: DC
  Filled 2021-12-22: qty 30, 30d supply, fill #0
  Filled 2022-02-15: qty 90, 90d supply, fill #1

## 2021-12-22 NOTE — Patient Instructions (Signed)
Medication Instructions:  Your physician has recommended you make the following change in your medication:   - START Farxiga 10 mg daily   *If you need a refill on your cardiac medications before your next appointment, please call your pharmacy*   Lab Work:  Today: BMP, BNP, D-dimer  [facility] Entrance at [facility] 1st desk on the right to check in (REGISTRATION)  Lab hours: Monday- Friday (7:30 am- 5:30 pm)   If you have labs (blood work) drawn today and your tests are completely normal, you will receive your results only by: MyChart Message (if you have MyChart) OR A paper copy in the mail If you have any lab test that is abnormal or we need to change your treatment, we will call you to review the results.   Testing/Procedures: None ordered   Follow-Up: At [facility], you and your health needs are our priority.  As part of our continuing mission to provide you with exceptional heart care, we have created designated Provider Care Teams.  These Care Teams include your primary Cardiologist (physician) and Advanced Practice Providers (APPs -  Physician Assistants and Nurse Practitioners) who all work together to provide you with the care you need, when you need it.  We recommend signing up for the patient portal called "MyChart".  Sign up information is provided on this After Visit Summary.  MyChart is used to connect with patients for Virtual Visits (Telemedicine).  Patients are able to view lab/test results, encounter notes, upcoming appointments, etc.  Non-urgent messages can be sent to your provider as well.   To learn more about what you can do with MyChart, go to [email or web address].    Your next appointment:   2 week(s)  The format for your next appointment:   In Person  Provider:   You may see Kathlyn Sacramento, MD or one of the following Advanced Practice Providers on your designated Care Team:   Murray Hodgkins, NP Christell Faith, PA-C Cadence Kathlen Mody, PA-C Gerrie Nordmann, NP   Important Information About Sugar

## 2021-12-22 NOTE — Progress Notes (Signed)
Potassium is a little on the low side. Recommend that she increase her potassium dose to 60 mEq BID for tonight and tomorrow.

## 2021-12-23 ENCOUNTER — Encounter: Payer: Self-pay | Admitting: Cardiovascular Disease

## 2021-12-23 ENCOUNTER — Other Ambulatory Visit: Payer: Self-pay

## 2021-12-23 DIAGNOSIS — I2510 Atherosclerotic heart disease of native coronary artery without angina pectoris: Secondary | ICD-10-CM | POA: Diagnosis not present

## 2021-12-23 DIAGNOSIS — R202 Paresthesia of skin: Secondary | ICD-10-CM | POA: Diagnosis not present

## 2021-12-23 DIAGNOSIS — J40 Bronchitis, not specified as acute or chronic: Secondary | ICD-10-CM | POA: Diagnosis not present

## 2021-12-23 MED ORDER — AMOXICILLIN 875 MG PO TABS
ORAL_TABLET | ORAL | 0 refills | Status: DC
Start: 2021-12-23 — End: 2022-01-03
  Filled 2021-12-23: qty 14, 7d supply, fill #0

## 2021-12-23 MED ORDER — PREDNISONE 10 MG PO TABS
ORAL_TABLET | ORAL | 0 refills | Status: DC
  Filled 2021-12-23: qty 21, 6d supply, fill #0

## 2021-12-23 MED ORDER — HYDROCOD POLI-CHLORPHE POLI ER 10-8 MG/5ML PO SUER
ORAL | 0 refills | Status: DC
  Filled 2021-12-23: qty 115, 12d supply, fill #0

## 2021-12-23 NOTE — Addendum Note (Signed)
Addended by: Nestor Ramp on: 12/23/2021 04:20 PM   Modules accepted: Orders

## 2021-12-24 DIAGNOSIS — R002 Palpitations: Secondary | ICD-10-CM

## 2021-12-26 ENCOUNTER — Encounter: Payer: Self-pay | Admitting: *Deleted

## 2021-12-26 NOTE — Telephone Encounter (Signed)
Can we get this taken care of for her today?

## 2021-12-30 ENCOUNTER — Telehealth: Payer: Self-pay | Admitting: Cardiovascular Disease

## 2021-12-30 NOTE — Telephone Encounter (Signed)
Pt c/o swelling: STAT is pt has developed SOB within 24 hours  If swelling, where is the swelling located? Stomach  How much weight have you gained and in what time span? NA  Have you gained 3 pounds in a day or 5 pounds in a week? Yes  Do you have a log of your daily weights (if so, list)? NO  Are you currently taking a fluid pill? Yes  Are you currently SOB? Yes - States that is normal  Have you traveled recently? No

## 2021-12-30 NOTE — Telephone Encounter (Signed)
Called pt and call was dropped. Attempted to call pt back and went to voicemail. Left detailed message on vm (ok per DPR) notified of Dr. Tyrell Antonio recc below. Asked pt to call back with any further questions.

## 2021-12-30 NOTE — Telephone Encounter (Signed)
Continue same dose of Lasix for now until I see her next week. She should follow a low-sodium diet.

## 2021-12-30 NOTE — Telephone Encounter (Signed)
Spoke to pt. Pt c/o swelling in abdomen that "has been going on for a while, but is worse today." Pt states she is now back at work and on her feet more, thinks this is likely the cause.   Pt denies swelling in legs or anywhere else.  Pt confirms continuing Lasix 40 mg daily and reviewed cardiac meds per med list and no missed doses.  Pt denies incr sodium in diet.   Pt does also report incr SOB with walking. States she wears a pulse ox and HR 101-110 while walking. SaO2 93-97%.   Pt is scheduled for follow up with Dr. Fletcher Anon next week 01/03/22.   ER precautions provided to pt.   Advised pt I will send note to Dr. Fletcher Anon for further recc.   Pt voiced understanding.

## 2022-01-03 ENCOUNTER — Encounter: Payer: Self-pay | Admitting: Cardiovascular Disease

## 2022-01-03 ENCOUNTER — Ambulatory Visit: Payer: 59 | Attending: Cardiovascular Disease | Admitting: Cardiovascular Disease

## 2022-01-03 VITALS — BP 130/84 | HR 92 | Ht 61.0 in | Wt 174.0 lb

## 2022-01-03 DIAGNOSIS — Z720 Tobacco use: Secondary | ICD-10-CM

## 2022-01-03 DIAGNOSIS — I471 Supraventricular tachycardia: Secondary | ICD-10-CM | POA: Diagnosis not present

## 2022-01-03 DIAGNOSIS — E785 Hyperlipidemia, unspecified: Secondary | ICD-10-CM | POA: Diagnosis not present

## 2022-01-03 DIAGNOSIS — I25118 Atherosclerotic heart disease of native coronary artery with other forms of angina pectoris: Secondary | ICD-10-CM

## 2022-01-03 DIAGNOSIS — I5022 Chronic systolic (congestive) heart failure: Secondary | ICD-10-CM | POA: Diagnosis not present

## 2022-01-03 DIAGNOSIS — E876 Hypokalemia: Secondary | ICD-10-CM

## 2022-01-03 NOTE — Progress Notes (Signed)
Cardiology Office Note   Date:  01/03/2022   ID:  ERCEL NORMOYLE, DOB [birth year], MRN [id number]  PCP:  Tracie Harrier, MD  Cardiologist:   Kathlyn Sacramento, MD   No chief complaint on file.     History of Present Illness: Lisa Davila is a 46 y.o. female who is here today for a follow-up visit regarding coronary artery disease and chronic systolic heart failure.  She has known history of SVT status post previous EP study with no inducible arrhythmia, essential hypertension, hyperlipidemia, fibromyalgia, tobacco use, anxiety and hypokalemia. She presented in February 2023 with chest pain and was found to have inferior ST elevation.  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  There was mild nonobstructive disease affecting the left coronary arteries.  Echocardiogram showed an EF of 50 to 55%.  Brilinta was switched to clopidogrel due to persistent dyspnea. She had COVID-19 infection in May.  She had recurrent chest pain after that we will multiple emergency room visits. Repeat echocardiogram in April showed an EF of 35%. She had a MUGA scan earlier this month which showed an EF of 35%. She cut down on tobacco use to 2 to 3 cigarettes a day.  She works as a Chartered certified accountant at [employer] but has not been able to resume her work since she had myocardial infarction.  Due to a drop in her ejection fraction and her symptoms, I proceeded with a right and left cardiac catheterization in July earlier this month.  RCA stent was patent with mild in-stent restenosis and no evidence of obstructive disease affecting the left coronary arteries.  Ejection fraction was 35%.  Right heart catheterization showed mildly to moderately elevated filling pressures, minimal pulmonary hypertension and normal cardiac output.  I increase the dose of furosemide to  40 mg once daily and added eplerenone especially in the setting of chronic hypokalemia.  She had sinus tachycardia that improved with increasing the dose of Toprol. She was seen recently and Farxiga 10 mg daily was added.  She continues to have intermittent palpitations and tachycardia and she just finished wearing a 1 week ZIO monitor.  She struggles with shortness of breath and abdominal bloating especially with long 12-hour shifts.   Past Medical History:  Diagnosis Date   Acute ethmoidal sinusitis    Acute ethmoidal sinusitis    Acute maxillary sinusitis    Acute upper respiratory infections of unspecified site    Anxiety state, unspecified    Chest pain, unspecified    Coronary artery disease    Depression    Diarrhea    Esophageal reflux    Generalized anxiety disorder    Hyperlipidemia    Hypoglycemia, unspecified    Lumbago    Nausea with vomiting    Obstructive chronic bronchitis with exacerbation (HCC)    Other and unspecified hyperlipidemia    Other and unspecified peripheral vertigo(386.19)    Other bursitis disorders    Other malaise and fatigue    Other specified diseases due to viruses    Other vitamin B12 deficiency anemia    Pain in limb    Pernicious anemia    Sleep related hypoventilation/hypoxemia in conditions classifiable elsewhere    Swelling, mass, or lump in head and neck    Tobacco use disorder    Unspecified disorder of external ear    Unspecified disorder of skin and  subcutaneous tissue    Unspecified otitis media     Past Surgical History:  Procedure Laterality Date   CESAREAN SECTION     CORONARY/GRAFT ACUTE MI REVASCULARIZATION N/A 07/04/2021   Procedure: Coronary/Graft Acute MI Revascularization;  Surgeon: Wellington Hampshire, MD;  Location: [facility];  Service: Cardiovascular;  Laterality: N/A;   LEFT HEART CATH AND CORONARY ANGIOGRAPHY N/A 07/04/2021   Procedure: LEFT HEART CATH AND CORONARY ANGIOGRAPHY;  Surgeon: Wellington Hampshire, MD;  Location: [facility];  Service: Cardiovascular;  Laterality: N/A;   RIGHT/LEFT HEART CATH AND CORONARY ANGIOGRAPHY N/A 11/07/2021   Procedure: RIGHT/LEFT HEART CATH AND CORONARY ANGIOGRAPHY;  Surgeon: Wellington Hampshire, MD;  Location: [facility] [place];  Service: Cardiovascular;  Laterality: N/A;     Current Outpatient Medications  Medication Sig Dispense Refill   albuterol (VENTOLIN HFA) 108 (90 Base) MCG/ACT inhaler Inhale 2 puffs into the lungs every 6 hours as needed for wheezing. 18 g 5   ALPRAZolam (XANAX) 0.5 MG tablet Take 1 tablet (0.5 mg total) by mouth 3 (three) times daily as needed for Sleep for up to 30 days 90 tablet 1   aspirin 81 MG chewable tablet Chew 1 tablet (81 mg total) by mouth daily.     chlorpheniramine-HYDROcodone (TUSSIONEX) 10-8 MG/5ML Take 5 mLs by mouth every 12 (twelve) hours as needed for up to 10 days 115 mL 0   Cholecalciferol 1.25 MG (50000 UT) TABS Take 1,000 mg by mouth once a week.     clopidogrel (PLAVIX) 75 MG tablet Take 1 tablet (75 mg total) by mouth daily. 30 tablet 10   cyclobenzaprine (FLEXERIL) 10 MG tablet Take 10 mg by mouth 2 (two) times daily as needed.     dapagliflozin propanediol (FARXIGA) 10 MG TABS tablet Take 1 tablet (10 mg total) by mouth daily before breakfast. 30 tablet 3   DULoxetine (CYMBALTA) 30 MG capsule Take 1 capsule (30 mg total) by mouth once daily 90 capsule 1   eplerenone (INSPRA) 25 MG tablet Take 1 tablet (25 mg total) by mouth daily. 30 tablet 2   ergocalciferol (VITAMIN D2) 1.25 MG (50000 UT) capsule Take one capsule by mouth once a week 12 capsule 1   furosemide (LASIX) 40 MG tablet Take 1 tablet (40 mg total) by mouth daily. 30 tablet 6   ipratropium-albuterol (DUONEB) 0.5-2.5 (3) MG/3ML SOLN Take 3 mls by nebulization 4 (four) times daily for 30 days 360 mL 1   metoprolol succinate (TOPROL-XL) 50 MG 24 hr tablet Take 1 tablet (50 mg total) by mouth daily. 30 tablet 6   nicotine (NICODERM CQ -  DOSED IN MG/24 HOURS) 14 mg/24hr patch Place 1 patch onto the skin daily for 14 days 14 patch 0   nitroGLYCERIN (NITROSTAT) 0.4 MG SL tablet Place 1 tablet (0.4 mg total) under the tongue every 5 (five) minutes as needed for chest pain. 50 tablet 0   potassium chloride (KLOR-CON) 10 MEQ tablet Take 4 tablets (40 mEq total) by mouth two (2) times a day. (Patient taking differently: Take 40 mEq by mouth daily.) 300 tablet 0   No current facility-administered medications for this visit.    Allergies:   Buspirone, Citalopram, Metrizamide, Sulfa antibiotics, Contrast media [iodinated contrast media], Sulfa drugs cross reactors, and Sulfasalazine    Social History:  The patient  reports that she has been smoking cigarettes. She has never used smokeless tobacco. She reports that she does not drink alcohol and  does not use drugs.   Family History:  The patient's family history includes Breast cancer in her maternal grandmother; Cancer in her father and paternal grandfather; Depression in her mother; Hypertension in her sister.    ROS:  Please see the history of present illness.   Otherwise, review of systems are positive for none.   All other systems are reviewed and negative.    PHYSICAL EXAM: VS:  BP 130/84   Pulse 92   Ht '5\' 1"'$  (1.549 m)   Wt 174 lb (78.9 kg)   SpO2 98%   BMI 32.88 kg/m  , BMI Body mass index is 32.88 kg/m. GEN: Well nourished, well developed, in no acute distress  HEENT: normal  Neck: no JVD, carotid bruits, or masses Cardiac: RRR; no murmurs, rubs, or gallops,no edema  Respiratory:  clear to auscultation bilaterally, normal work of breathing GI: soft, nontender, nondistended, + BS MS: no deformity or atrophy  Skin: warm and dry, no rash Neuro:  Strength and sensation are intact Psych: euthymic mood, full affect    EKG:  EKG is not ordered today.    Recent Labs: 09/05/2021: ALT 19 11/11/2021: Hemoglobin 14.5; Magnesium 2.1; Platelets 367 12/22/2021: B  Natriuretic Peptide 70.5; BUN <5; Creatinine, Ser 0.86; Potassium 3.3; Sodium 140    Lipid Panel    Component Value Date/Time   CHOL 276 (H) 07/04/2021 0900   CHOL 280 (H) 11/29/2011 1326   TRIG 183 (H) 07/04/2021 0900   TRIG 186 11/29/2011 1326   HDL 33 (L) 07/04/2021 0900   HDL 29 (L) 11/29/2011 1326   CHOLHDL 8.4 07/04/2021 0900   VLDL 37 07/04/2021 0900   VLDL 37 11/29/2011 1326   LDLCALC NOT CALCULATED 07/04/2021 0900   LDLCALC 214 (H) 11/29/2011 1326      Wt Readings from Last 3 Encounters:  01/03/22 174 lb (78.9 kg)  12/22/21 173 lb 9.6 oz (78.7 kg)  11/22/21 174 lb 8 oz (79.2 kg)          10/26/2021    3:07 PM  PAD Screen  Previous PAD dx? No  Previous surgical procedure? No  Pain with walking? No  Feet/toe relief with dangling? No  Painful, non-healing ulcers? No  Extremities discolored? No      ASSESSMENT AND PLAN:  1.  Coronary artery disease involving native coronary arteries other forms of angina: Relook cardiac cath in July showed patent RCA stent with only mild in-stent restenosis.   Continue dual antiplatelet therapy and cardiac medications.  2.  Chronic systolic heart failure: Ejection fraction postmyocardial infarction was 50 to 55%.  However, most recent ejection fraction was 35% by MUGA scan and was confirmed by left ventricular angiography.  Right heart catheterization showed mildly to moderately elevated filling pressures with no significant pulmonary hypertension.  She appears to be euvolemic on furosemide 40 mg once daily, eplerenone 25 mg once daily and [place]. Sinus tachycardia improved with increasing Toprol.  Her blood pressure has been running low at home frequently in the 16X systolic.  Thus, I am hesitant to add Entresto at the present time but we will plan on doing so in the next few months. She remains in [employer] class [employer] and I do not think she will be able to continue doing 12-hour shifts.  I have discussed with  her my recommendations to switch to maximal of 8-hour shifts.  We will try to assist with the paperwork.  3.  Paroxysmal supraventricular tachycardia: She is mostly  having issues with sinus tachycardia at the present time.  Stable on Toprol.  ZIO monitor is pending.  4.  Hyperlipidemia: Continue treatment with high-dose atorvastatin.    5.  Tobacco use: She cut down to 2 to 3 cigarettes daily.  She reports difficulty in quitting due to known history of anxiety and depression.  6.  Hypokalemia: Continue potassium replacement and will consider adding eplerenone.     Disposition: Follow-up in 3 months.  Signed,  Kathlyn Sacramento, MD  01/03/2022 5:29 PM     [facility]

## 2022-01-03 NOTE — Patient Instructions (Signed)
Medication Instructions:   NONE  *If you need a refill on your cardiac medications before your next appointment, please call your pharmacy*   Lab Work:  NONE  If you have labs (blood work) drawn today and your tests are completely normal, you will receive your results only by: MyChart Message (if you have MyChart) OR A paper copy in the mail If you have any lab test that is abnormal or we need to change your treatment, we will call you to review the results.   Testing/Procedures:  NONE   Follow-Up: At [facility], you and your health needs are our priority.  As part of our continuing mission to provide you with exceptional heart care, we have created designated Provider Care Teams.  These Care Teams include your primary Cardiologist (physician) and Advanced Practice Providers (APPs -  Physician Assistants and Nurse Practitioners) who all work together to provide you with the care you need, when you need it.  We recommend signing up for the patient portal called "MyChart".  Sign up information is provided on this After Visit Summary.  MyChart is used to connect with patients for Virtual Visits (Telemedicine).  Patients are able to view lab/test results, encounter notes, upcoming appointments, etc.  Non-urgent messages can be sent to your provider as well.   To learn more about what you can do with MyChart, go to [email or web address].    Your next appointment:   3 month(s)  The format for your next appointment:   In Person  Provider:   You may see Muhammad Arida, MD or one of the following Advanced Practice Providers on your designated Care Team:   Christopher Berge, NP Ryan Dunn, PA-C Cadence Furth, PA-C Sheri Hammock, NP      Important Information About Sugar       

## 2022-01-06 ENCOUNTER — Telehealth: Payer: Self-pay

## 2022-01-06 ENCOUNTER — Encounter: Payer: Self-pay | Admitting: Cardiovascular Disease

## 2022-01-06 NOTE — Telephone Encounter (Addendum)
Heart Failure Nurse Navigator Note  Lisa Davila stops me in the hall  while rounding on 2[employer], asked questions about heart failure.  She states that she has never really been given any instructions as to what she should be doing or should not be doing. She states her heart functions is approximately 30%  Went over the importance of daily weights, recording and reporting 2 to 3 pound weight gain overnight or a total of 5 pounds within the week.  Also to report changes in symptoms such as worsening shortness of breath, PND, orthopnea, abdominal swelling, and lower extremity edema.  Briefly discussed not using salt at the table and eating low-sodium foods.  She asked about using vegetables that come canned and told her I recommended that that be drained in a colander, rinsed well and then cooked in plain water.  She states that she continues to be plagued with abdominal swelling, fatigue and shortness of breath walking any distance.  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  I asked her if she would be interested in following with the outpatient heart failure clinic and she said yes.  She was given an appointment for September 7 at 1 PM.  She was also given a scale to weigh herself daily as she does not have one.  Was given the educational packet that we give our typical heart failure patients that contains a living with heart failure teaching booklet, zone magnet, info on low-sodium, what heart failure is and weight chart.  She was also informed that she qualifies for the ventricle health program and was given handout and explained the program to her.  She had no further questions.  Pricilla Riffle RN CHFN

## 2022-01-10 ENCOUNTER — Other Ambulatory Visit: Payer: Self-pay

## 2022-01-10 ENCOUNTER — Other Ambulatory Visit: Payer: Self-pay | Admitting: *Deleted

## 2022-01-10 ENCOUNTER — Encounter: Payer: Self-pay | Admitting: Cardiovascular Disease

## 2022-01-10 MED ORDER — CLOPIDOGREL BISULFATE 75 MG PO TABS
75.0000 mg | ORAL_TABLET | Freq: Every day | ORAL | 0 refills | Status: DC
  Filled 2022-01-10 – 2022-02-15 (×2): qty 90, 90d supply, fill #0

## 2022-01-10 MED ORDER — FUROSEMIDE 40 MG PO TABS
40.0000 mg | ORAL_TABLET | Freq: Every day | ORAL | 0 refills | Status: DC
  Filled 2022-01-10: qty 90, 90d supply, fill #0

## 2022-01-10 MED ORDER — POTASSIUM CHLORIDE ER 10 MEQ PO TBCR
EXTENDED_RELEASE_TABLET | ORAL | 0 refills | Status: DC
  Filled 2022-01-10: qty 240, 30d supply, fill #0

## 2022-01-11 DIAGNOSIS — R002 Palpitations: Secondary | ICD-10-CM | POA: Diagnosis not present

## 2022-01-12 ENCOUNTER — Ambulatory Visit: Payer: 59 | Admitting: Family

## 2022-01-12 ENCOUNTER — Telehealth: Payer: Self-pay | Admitting: Family

## 2022-01-12 NOTE — Telephone Encounter (Signed)
Patient did not show for her initial Heart Failure Clinic appointment on 01/12/22. Will attempt to reschedule.

## 2022-01-12 NOTE — Progress Notes (Deleted)
   Patient ID: Lisa Davila, female    DOB: 08-27-1975, 46 y.o.   MRN: [id number]  HPI  Lisa Davila is a 46 y/o female with a history of  Echo report from 08/29/21 reviewed and showed an EF of 35%  RHC/LHC done 11/07/21 and showed:   Mid LAD lesion is 30% stenosed.   Prox RCA lesion is 20% stenosed.   2nd Diag lesion is 50% stenosed.   There is moderate left ventricular systolic dysfunction.   LV end diastolic pressure is mildly elevated.   1.  Patent proximal RCA stent with mild in-stent restenosis.  No obstructive disease affecting the left coronary artery system. 2.  Moderately reduced LV systolic function with an EF of 35% with global hypokinesis that is more prominent in the inferior apical area. 3.  Right heart catheterization showed mildly to moderately elevated filling pressures, minimal pulmonary hypertension and normal cardiac output.  Was in the ED 11/11/21 due to hypokalemia which was replenished and she was released.   She presents today for her initial visit with a chief complaint of   Review of Systems    Physical Exam     Assessment & Plan:  1: Chronic heart failure with reduced ejection fraction- - NYHA class  2: HTN- - BP  3: SVT-  4: Anxiety-  5: Tobacco use-

## 2022-01-13 ENCOUNTER — Other Ambulatory Visit: Payer: Self-pay

## 2022-01-17 ENCOUNTER — Other Ambulatory Visit: Payer: Self-pay

## 2022-01-17 NOTE — Progress Notes (Unsigned)
   Patient ID: ZHARIA CONROW, female    DOB: May 24, 1975, 46 y.o.   MRN: [id number]  HPI  Ms Washburn is a 46 y/o female with a history of  Echo report from 08/29/21 reviewed and showed an EF of 35%  RHC/LHC done 11/07/21 and showed:   Mid LAD lesion is 30% stenosed.   Prox RCA lesion is 20% stenosed.   2nd Diag lesion is 50% stenosed.   There is moderate left ventricular systolic dysfunction.   LV end diastolic pressure is mildly elevated.   1.  Patent proximal RCA stent with mild in-stent restenosis.  No obstructive disease affecting the left coronary artery system. 2.  Moderately reduced LV systolic function with an EF of 35% with global hypokinesis that is more prominent in the inferior apical area. 3.  Right heart catheterization showed mildly to moderately elevated filling pressures, minimal pulmonary hypertension and normal cardiac output.  Was in the ED 11/11/21 due to hypokalemia which was replenished and she was released.   She presents today for her initial visit with a chief complaint of   Review of Systems    Physical Exam     Assessment & Plan:  1: Chronic heart failure with reduced ejection fraction- - NYHA class - BNP 12/22/21 was 70.5  2: HTN- - BP - saw PCP (Tumey) 12/23/21 - BMP 12/22/21 reviewed and showed sodium 140, potassium 3.3, creatinine 0.86 & GFR >60  3: SVT- - saw cardiology Fletcher Anon) 01/03/22  4: Anxiety-  5: Tobacco use-

## 2022-01-18 ENCOUNTER — Ambulatory Visit: Payer: 59

## 2022-01-18 ENCOUNTER — Encounter: Payer: Self-pay | Admitting: Pharmacist

## 2022-01-18 ENCOUNTER — Other Ambulatory Visit: Payer: Self-pay | Admitting: Physician Assistant

## 2022-01-18 ENCOUNTER — Ambulatory Visit: Payer: 59 | Attending: Family | Admitting: Family

## 2022-01-18 ENCOUNTER — Other Ambulatory Visit: Payer: Self-pay

## 2022-01-18 ENCOUNTER — Encounter: Payer: Self-pay | Admitting: Family

## 2022-01-18 ENCOUNTER — Telehealth: Payer: Self-pay | Admitting: Family

## 2022-01-18 VITALS — BP 101/67 | HR 80 | Resp 18 | Ht 61.0 in | Wt 176.0 lb

## 2022-01-18 DIAGNOSIS — I10 Essential (primary) hypertension: Secondary | ICD-10-CM | POA: Diagnosis not present

## 2022-01-18 DIAGNOSIS — Z955 Presence of coronary angioplasty implant and graft: Secondary | ICD-10-CM | POA: Diagnosis not present

## 2022-01-18 DIAGNOSIS — R200 Anesthesia of skin: Secondary | ICD-10-CM | POA: Diagnosis not present

## 2022-01-18 DIAGNOSIS — I5022 Chronic systolic (congestive) heart failure: Secondary | ICD-10-CM | POA: Insufficient documentation

## 2022-01-18 DIAGNOSIS — Z7984 Long term (current) use of oral hypoglycemic drugs: Secondary | ICD-10-CM | POA: Insufficient documentation

## 2022-01-18 DIAGNOSIS — R002 Palpitations: Secondary | ICD-10-CM | POA: Diagnosis not present

## 2022-01-18 DIAGNOSIS — J449 Chronic obstructive pulmonary disease, unspecified: Secondary | ICD-10-CM | POA: Diagnosis not present

## 2022-01-18 DIAGNOSIS — R202 Paresthesia of skin: Secondary | ICD-10-CM | POA: Insufficient documentation

## 2022-01-18 DIAGNOSIS — I219 Acute myocardial infarction, unspecified: Secondary | ICD-10-CM | POA: Insufficient documentation

## 2022-01-18 DIAGNOSIS — E876 Hypokalemia: Secondary | ICD-10-CM | POA: Insufficient documentation

## 2022-01-18 DIAGNOSIS — R42 Dizziness and giddiness: Secondary | ICD-10-CM | POA: Insufficient documentation

## 2022-01-18 DIAGNOSIS — I471 Supraventricular tachycardia: Secondary | ICD-10-CM | POA: Insufficient documentation

## 2022-01-18 DIAGNOSIS — Z720 Tobacco use: Secondary | ICD-10-CM | POA: Insufficient documentation

## 2022-01-18 DIAGNOSIS — I2510 Atherosclerotic heart disease of native coronary artery without angina pectoris: Secondary | ICD-10-CM | POA: Insufficient documentation

## 2022-01-18 DIAGNOSIS — G479 Sleep disorder, unspecified: Secondary | ICD-10-CM | POA: Diagnosis not present

## 2022-01-18 DIAGNOSIS — M79604 Pain in right leg: Secondary | ICD-10-CM

## 2022-01-18 DIAGNOSIS — I110 Hypertensive heart disease with heart failure: Secondary | ICD-10-CM | POA: Insufficient documentation

## 2022-01-18 DIAGNOSIS — I252 Old myocardial infarction: Secondary | ICD-10-CM | POA: Insufficient documentation

## 2022-01-18 DIAGNOSIS — K219 Gastro-esophageal reflux disease without esophagitis: Secondary | ICD-10-CM | POA: Diagnosis not present

## 2022-01-18 DIAGNOSIS — F32A Depression, unspecified: Secondary | ICD-10-CM | POA: Diagnosis not present

## 2022-01-18 DIAGNOSIS — I272 Pulmonary hypertension, unspecified: Secondary | ICD-10-CM | POA: Insufficient documentation

## 2022-01-18 DIAGNOSIS — F419 Anxiety disorder, unspecified: Secondary | ICD-10-CM | POA: Insufficient documentation

## 2022-01-18 DIAGNOSIS — R140 Abdominal distension (gaseous): Secondary | ICD-10-CM | POA: Insufficient documentation

## 2022-01-18 DIAGNOSIS — E785 Hyperlipidemia, unspecified: Secondary | ICD-10-CM | POA: Insufficient documentation

## 2022-01-18 LAB — BASIC METABOLIC PANEL
Anion gap: 7 (ref 5–15)
BUN: 6 mg/dL (ref 6–20)
CO2: 27 mmol/L (ref 22–32)
Calcium: 8.9 mg/dL (ref 8.9–10.3)
Chloride: 105 mmol/L (ref 98–111)
Creatinine, Ser: 0.83 mg/dL (ref 0.44–1.00)
GFR, Estimated: >60 mL/min (ref >60)
Glucose, Bld: 71 mg/dL (ref 70–99)
Potassium: 3.6 mmol/L (ref 3.5–5.1)
Sodium: 139 mmol/L (ref 135–145)

## 2022-01-18 MED ORDER — POTASSIUM CHLORIDE ER 10 MEQ PO TBCR: 40.0000 meq | EXTENDED_RELEASE_TABLET | Freq: Every day | ORAL | 0 refills | Status: DC

## 2022-01-18 NOTE — Progress Notes (Signed)
Patient ID: Lisa Davila, female   DOB: 07-11-1975, 46 y.o.   MRN: [id number]  [place] COUNSELING NOTE  Guideline-Directed Medical Therapy/Evidence Based Medicine  ACE/ARB/ARNI:  none - soft BP Beta Blocker: Metoprolol succinate 50 mg daily Aldosterone Antagonist: Eplerenone 25 mg daily Diuretic: Furosemide 40 mg daily SGLT2i: Dapagliflozin 10 mg daily  Adherence Assessment  Do you ever forget to take your medication? '[]'$ Yes '[x]'$ No  Do you ever skip doses due to side effects? '[x]'$ Yes '[]'$ No  Do you have trouble affording your medicines? '[]'$ Yes '[x]'$ No  Are you ever unable to pick up your medication due to transportation difficulties? '[]'$ Yes '[x]'$ No  Do you ever stop taking your medications because you don't believe they are helping? '[]'$ Yes '[x]'$ No  Do you check your weight daily? '[]'$ Yes '[x]'$ No   Adherence strategy: none  Barriers to obtaining medications: none  Vital signs: HR 80, BP 101/67, weight (pounds) 176 lbs ECHO: Date 08/29/2021, EF 35%     Latest Ref Rng & Units 01/18/2022   10:38 AM 12/22/2021   11:47 AM 11/18/2021   11:58 AM  BMP  Glucose 70 - 99 mg/dL 71  108  95   BUN 6 - 20 mg/dL 6  <5  <5   Creatinine 0.44 - 1.00 mg/dL 0.83  0.86  0.87   Sodium 135 - 145 mmol/L 139  140  138   Potassium 3.5 - 5.1 mmol/L 3.6  3.3  3.9   Chloride 98 - 111 mmol/L 105  106  106   CO2 22 - 32 mmol/L '27  26  25   '$ Calcium 8.9 - 10.3 mg/dL 8.9  9.3  9.1     Past Medical History:  Diagnosis Date   Acute ethmoidal sinusitis    Acute ethmoidal sinusitis    Acute maxillary sinusitis    Acute upper respiratory infections of unspecified site    Anxiety state, unspecified    Chest pain, unspecified    CHF (congestive heart failure) (HCC)    Coronary artery disease    Depression    Diarrhea    Esophageal reflux    Generalized anxiety disorder    Hyperlipidemia    Hypertension    Hypoglycemia, unspecified    Lumbago     Nausea with vomiting    Obstructive chronic bronchitis with exacerbation (HCC)    Other and unspecified hyperlipidemia    Other and unspecified peripheral vertigo(386.19)    Other bursitis disorders    Other malaise and fatigue    Other specified diseases due to viruses    Other vitamin B12 deficiency anemia    Pain in limb    Pernicious anemia    Sleep related hypoventilation/hypoxemia in conditions classifiable elsewhere    SVT (supraventricular tachycardia) (HCC)    Swelling, mass, or lump in head and neck    Tobacco use disorder    Unspecified disorder of external ear    Unspecified disorder of skin and subcutaneous tissue    Unspecified otitis media     ASSESSMENT 46 year old female who presents to the HF clinic initial visit. PMH includes CAD (MI), hyperlipidemia, HTN, anxiety, depression, esophageal reflux, lumbago, COPD, anemia, SVT, tobacco use and chronic heart failure. Patient is using nicotine patches as prescribed but still smoking every day. She expresses interest in quitting and reports smoking "less" than before. SOB remains a problems, but noted decrease appetite and fullness.  Medication reconciliation completed by me (pharmacist), all medication questions  answered during visit.  PLAN  Repeat BMET today If diuresis needed, increase furosemide dose to 40 mg BID x 2 days, increase potassium to 40 mEq TID x 2 days, and HOLD eplerenone while diuresing to avoid hypotension - noted BP noted today. Consider adding low dose losartan once stable to completed GDMT. Consider Zyban (bupropion SR '150mg'$  q 12hr) as therapy for smoking cessation and depression/anxiety.   Time spent: 20 minutes Eagan Shifflett Rodriguez-Guzman PharmD, BCPS 01/18/2022 1:23 PM    Current Outpatient Medications:    albuterol (VENTOLIN HFA) 108 (90 Base) MCG/ACT inhaler, Inhale 2 puffs into the lungs every 6 hours as needed for wheezing., Disp: 18 g, Rfl: 5   ALPRAZolam (XANAX) 0.5 MG tablet, Take 1 tablet  (0.5 mg total) by mouth 3 (three) times daily as needed for Sleep for up to 30 days, Disp: 90 tablet, Rfl: 1   aspirin 81 MG chewable tablet, Chew 1 tablet (81 mg total) by mouth daily., Disp: , Rfl:    atorvastatin (LIPITOR) 80 MG tablet, Take 1 tablet (80 mg total) by mouth once daily, Disp: 90 tablet, Rfl: 3   clopidogrel (PLAVIX) 75 MG tablet, Take 1 tablet (75 mg total) by mouth daily., Disp: 90 tablet, Rfl: 0   dapagliflozin propanediol (FARXIGA) 10 MG TABS tablet, Take 1 tablet (10 mg total) by mouth daily before breakfast., Disp: 30 tablet, Rfl: 3   DULoxetine (CYMBALTA) 30 MG capsule, Take 1 capsule (30 mg total) by mouth once daily, Disp: 90 capsule, Rfl: 1   eplerenone (INSPRA) 25 MG tablet, Take 1 tablet (25 mg total) by mouth daily., Disp: 30 tablet, Rfl: 2   ergocalciferol (VITAMIN D2) 1.25 MG (50000 UT) capsule, Take one capsule by mouth once a week, Disp: 12 capsule, Rfl: 1   furosemide (LASIX) 40 MG tablet, Take 1 tablet (40 mg total) by mouth daily., Disp: 90 tablet, Rfl: 0   ipratropium-albuterol (DUONEB) 0.5-2.5 (3) MG/3ML SOLN, Take 3 mls by nebulization 4 (four) times daily for 30 days, Disp: 360 mL, Rfl: 1   metoprolol succinate (TOPROL-XL) 50 MG 24 hr tablet, Take 1 tablet (50 mg total) by mouth daily., Disp: 30 tablet, Rfl: 6   nicotine (NICODERM CQ - DOSED IN MG/24 HOURS) 14 mg/24hr patch, Place 1 patch onto the skin daily for 14 days, Disp: 14 patch, Rfl: 0   nitroGLYCERIN (NITROSTAT) 0.4 MG SL tablet, Place 1 tablet (0.4 mg total) under the tongue every 5 (five) minutes as needed for chest pain., Disp: 50 tablet, Rfl: 0   potassium chloride (KLOR-CON) 10 MEQ tablet, Take 4 tablets (40 mEq total) by mouth daily., Disp: 240 tablet, Rfl: 0   MEDICATION ADHERENCES TIPS AND STRATEGIES Taking medication as prescribed improves patient outcomes in heart failure (reduces hospitalizations, improves symptoms, increases survival) Side effects of medications can be managed by  decreasing doses, switching agents, stopping drugs, or adding additional therapy. Please let someone in the [facility] know if you have having bothersome side effects so we can modify your regimen. Do not alter your medication regimen without talking to [place].  Medication reminders can help patients remember to take drugs on time. If you are missing or forgetting doses you can try linking behaviors, using pill boxes, or an electronic reminder like an alarm on your phone or an app. Some people can also get automated phone calls as medication reminders.

## 2022-01-18 NOTE — Patient Instructions (Addendum)
Begin weighing daily and call for an overnight weight gain of 3 pounds or more or a weekly weight gain of more than 5 pounds.   If you have voicemail, please make sure your mailbox is cleaned out so that we may leave a message and please make sure to listen to any voicemails.   Work on decreasing fluid intake to 60-64 ounces

## 2022-01-18 NOTE — Telephone Encounter (Signed)
Spoke with patient regarding BMP results obtained earlier today. Potassium, sodium and kidney function are all normal.   For the next 2 days will make the following changes due to her symptoms and swelling:  Double furosemide to '40mg'$  BID  Double potassium to 75mq BID  Hold eplerenone (due to BP)  After 2 days, resume her medications per her normal schedule. Patient verbalized understanding and could repeat the changes above that would take place for the next 2 days.

## 2022-01-19 ENCOUNTER — Ambulatory Visit
Admission: RE | Admit: 2022-01-19 | Discharge: 2022-01-19 | Disposition: A | Discharge status: Home / Self Care | Payer: 59 | Source: Ambulatory Visit | Attending: Physician Assistant | Admitting: Physician Assistant

## 2022-01-19 DIAGNOSIS — M79604 Pain in right leg: Secondary | ICD-10-CM | POA: Insufficient documentation

## 2022-01-19 DIAGNOSIS — R202 Paresthesia of skin: Secondary | ICD-10-CM | POA: Insufficient documentation

## 2022-01-19 DIAGNOSIS — M5136 Other intervertebral disc degeneration, lumbar region: Secondary | ICD-10-CM | POA: Diagnosis not present

## 2022-01-19 DIAGNOSIS — I700 Atherosclerosis of aorta: Secondary | ICD-10-CM | POA: Diagnosis not present

## 2022-01-19 NOTE — Telephone Encounter (Signed)
Patient dropped off forms  ?Placed in nurse box ?

## 2022-01-20 ENCOUNTER — Other Ambulatory Visit: Payer: Self-pay

## 2022-01-20 DIAGNOSIS — K1120 Sialoadenitis, unspecified: Secondary | ICD-10-CM | POA: Diagnosis not present

## 2022-01-20 DIAGNOSIS — Z6833 Body mass index (BMI) 33.0-33.9, adult: Secondary | ICD-10-CM | POA: Diagnosis not present

## 2022-01-20 MED ORDER — AMOXICILLIN-POT CLAVULANATE 875-125 MG PO TABS
ORAL_TABLET | ORAL | 0 refills | Status: DC
  Filled 2022-01-20: qty 20, 10d supply, fill #0

## 2022-01-20 MED ORDER — METHYLPREDNISOLONE 4 MG PO TBPK
ORAL_TABLET | ORAL | 0 refills | Status: DC
  Filled 2022-01-20: qty 21, 6d supply, fill #0

## 2022-01-27 ENCOUNTER — Other Ambulatory Visit: Payer: Self-pay

## 2022-01-27 DIAGNOSIS — I2510 Atherosclerotic heart disease of native coronary artery without angina pectoris: Secondary | ICD-10-CM | POA: Diagnosis not present

## 2022-01-27 DIAGNOSIS — E538 Deficiency of other specified B group vitamins: Secondary | ICD-10-CM | POA: Diagnosis not present

## 2022-01-27 DIAGNOSIS — R202 Paresthesia of skin: Secondary | ICD-10-CM | POA: Diagnosis not present

## 2022-01-27 DIAGNOSIS — E559 Vitamin D deficiency, unspecified: Secondary | ICD-10-CM | POA: Diagnosis not present

## 2022-01-27 DIAGNOSIS — M797 Fibromyalgia: Secondary | ICD-10-CM | POA: Diagnosis not present

## 2022-01-27 DIAGNOSIS — Z955 Presence of coronary angioplasty implant and graft: Secondary | ICD-10-CM | POA: Diagnosis not present

## 2022-01-27 DIAGNOSIS — Z720 Tobacco use: Secondary | ICD-10-CM | POA: Diagnosis not present

## 2022-01-27 DIAGNOSIS — R7309 Other abnormal glucose: Secondary | ICD-10-CM | POA: Diagnosis not present

## 2022-01-27 MED ORDER — DULOXETINE HCL 30 MG PO CPEP
ORAL_CAPSULE | ORAL | 1 refills | Status: DC
  Filled 2022-01-27: qty 90, 90d supply, fill #0

## 2022-01-27 MED ORDER — ALPRAZOLAM 0.5 MG PO TABS
ORAL_TABLET | ORAL | 2 refills | Status: AC
  Filled 2022-01-27 – 2022-02-15 (×2): qty 90, 30d supply, fill #0
  Filled 2022-03-21: qty 90, 30d supply, fill #1
  Filled 2022-04-25: qty 90, 30d supply, fill #2

## 2022-01-27 MED ORDER — VITAMIN B-12 1000 MCG PO TABS: ORAL_TABLET | ORAL | 1 refills | Status: AC

## 2022-01-27 MED ORDER — ERGOCALCIFEROL 1.25 MG (50000 UT) PO CAPS
ORAL_CAPSULE | ORAL | 1 refills | Status: DC
  Filled 2022-01-27 – 2022-02-15 (×2): qty 12, 84d supply, fill #0

## 2022-01-30 ENCOUNTER — Encounter: Payer: Self-pay | Admitting: Cardiovascular Disease

## 2022-01-30 DIAGNOSIS — R457 State of emotional shock and stress, unspecified: Secondary | ICD-10-CM | POA: Diagnosis not present

## 2022-01-30 DIAGNOSIS — R002 Palpitations: Secondary | ICD-10-CM | POA: Diagnosis not present

## 2022-01-31 ENCOUNTER — Telehealth: Payer: Self-pay | Admitting: Cardiovascular Disease

## 2022-01-31 NOTE — Telephone Encounter (Signed)
Patient c/o Palpitations:  High priority if patient c/o lightheadedness, shortness of breath, or chest pain  How long have you had palpitations/irregular HR/ Afib? Are you having the symptoms now? Last night at 11:00 pm  Are you currently experiencing lightheadedness, SOB or CP? CP  Do you have a history of afib (atrial fibrillation) or irregular heart rhythm? Yes  Have you checked your BP or HR? (document readings if available):  100 - HR  Are you experiencing any other symptoms? No

## 2022-01-31 NOTE — Telephone Encounter (Signed)
Pt also sent a mychart msg today. (Msg fwd to PharmD) Pt reported an episode of palpitations last night after taking  fluconazole (prescribed by her pcp) to get rid of the yeast infection in her mouth.    Will route to Dr. Fletcher Anon to advise.

## 2022-01-31 NOTE — Telephone Encounter (Signed)
See 01/31/22 mychart encounter.

## 2022-01-31 NOTE — Telephone Encounter (Signed)
Flucanazole should be fine.  She should monitor her symptoms and let us know if it happens again.

## 2022-02-10 ENCOUNTER — Other Ambulatory Visit: Payer: Self-pay

## 2022-02-13 ENCOUNTER — Encounter: Payer: Self-pay | Admitting: Cardiovascular Disease

## 2022-02-14 ENCOUNTER — Other Ambulatory Visit: Payer: Self-pay

## 2022-02-15 ENCOUNTER — Other Ambulatory Visit: Payer: Self-pay

## 2022-02-16 ENCOUNTER — Other Ambulatory Visit: Payer: Self-pay

## 2022-02-17 ENCOUNTER — Telehealth: Payer: Self-pay | Admitting: Cardiovascular Disease

## 2022-02-17 NOTE — Telephone Encounter (Signed)
Dr. Madaline Brilliant is calling requesting to speak with Dr. Fletcher Anon for a clinical update. He is requesting a callback today if possible, or Monday if need be. This is not an urgent issue. Please advise.

## 2022-02-20 NOTE — Telephone Encounter (Signed)
I called and spoke with Dr. Madaline Brilliant about disability determination.

## 2022-03-01 ENCOUNTER — Ambulatory Visit: Payer: 59 | Admitting: Family

## 2022-03-04 ENCOUNTER — Emergency Department: Payer: 59

## 2022-03-04 ENCOUNTER — Emergency Department
Admission: EM | Admit: 2022-03-04 | Discharge: 2022-03-04 | Disposition: A | Discharge status: Home / Self Care | Payer: 59 | Attending: Emergency Medicine | Admitting: Emergency Medicine

## 2022-03-04 ENCOUNTER — Other Ambulatory Visit: Payer: Self-pay

## 2022-03-04 DIAGNOSIS — M25512 Pain in left shoulder: Secondary | ICD-10-CM | POA: Diagnosis not present

## 2022-03-04 DIAGNOSIS — M25511 Pain in right shoulder: Secondary | ICD-10-CM | POA: Insufficient documentation

## 2022-03-04 DIAGNOSIS — G8929 Other chronic pain: Secondary | ICD-10-CM

## 2022-03-04 DIAGNOSIS — M12811 Other specific arthropathies, not elsewhere classified, right shoulder: Secondary | ICD-10-CM | POA: Insufficient documentation

## 2022-03-04 MED ORDER — LIDOCAINE 5 % EX PTCH
1.0000 | MEDICATED_PATCH | CUTANEOUS | Status: DC
  Administered 2022-03-04: 1 via TRANSDERMAL
  Filled 2022-03-04: qty 1

## 2022-03-04 MED ORDER — DEXAMETHASONE SODIUM PHOSPHATE 10 MG/ML IJ SOLN
10.0000 mg | Freq: Once | INTRAMUSCULAR | Status: AC
  Administered 2022-03-04: 10 mg via INTRAMUSCULAR
  Filled 2022-03-04: qty 1

## 2022-03-04 NOTE — ED Provider Notes (Signed)
[facility] Provider Note    Event Date/Time   First MD Initiated Contact with Patient 03/04/22 [phone]     (approximate)   History   Shoulder Pain   HPI  Lisa Davila is a 46 y.o. female   presents to the ED with complaint of bilateral shoulder pain and history of rotator cuff injury.  Patient has been seen by [facility] orthopedic department and has received cortisone injections with relief of her pain in the past.  Patient denies any recent injury.  She states that she called the office and is unable to get an appointment until January.      Physical Exam   Triage Vital Signs: ED Triage Vitals  Enc Vitals Group     BP 03/04/22 0633 131/79     Pulse Rate 03/04/22 0633 92     Resp 03/04/22 0633 20     Temp 03/04/22 0633 98.1 F (36.7 C)     Temp Source 03/04/22 0633 Oral     SpO2 03/04/22 0633 98 %     Weight 03/04/22 0633 186 lb (84.4 kg)     Height 03/04/22 0633 '5\' 1"'$  (1.549 m)     Head Circumference --      Peak Flow --      Pain Score 03/04/22 0640 10     Pain Loc --      Pain Edu? --      Excl. in [place]? --     Most recent vital signs: Vitals:   03/04/22 0633  BP: 131/79  Pulse: 92  Resp: 20  Temp: 98.1 F (36.7 C)  SpO2: 98%     General: Awake, no distress.  CV:  Good peripheral perfusion.  Heart regular rate and rhythm. Resp:  Normal effort.  Clear bilaterally. Abd:  No distention.  Other:  No gross deformities noted on examination of the upper extremities.  Patient is moderately tender on palpation of the right trapezius muscle into the deltoid area.  Range of motion is guarded secondary to increased pain.  No crepitus is appreciated at this time.  No bony tenderness is noted on palpation of the cervical or thoracic spine.   ED Results / Procedures / Treatments   Labs (all labs ordered are listed, but only abnormal results are displayed) Labs Reviewed - No data to display  Radiology  X-ray images of the right  shoulder were reviewed and interpreted by myself independent of the radiologist and was negative for acute fracture or dislocation.  Radiology report is negative.   PROCEDURES:  Critical Care performed:   Procedures   MEDICATIONS ORDERED IN ED: Medications  lidocaine (LIDODERM) 5 % 1 patch (1 patch Transdermal Patch Applied 03/04/22 0825)  dexamethasone (DECADRON) injection 10 mg (10 mg Intramuscular Given 03/04/22 0826)     IMPRESSION / MDM / ASSESSMENT AND PLAN / ED COURSE  I reviewed the triage vital signs and the nursing notes.   Differential diagnosis includes, but is not limited to, chronic bilateral shoulder pain, acute exacerbation of chronic pain.  45 year old female presents to the ED with complaint of bilateral shoulder pain with the right being greater than left.  Patient denies any recent injury and has a history of rotator cuff injury for which she has been seeing [facility] orthopedic department and receiving steroid shots.  Patient was given Decadron 10 mg IM and a Lidoderm patch applied to her right shoulder.  I sent a message to Lisa Kos, PA-C  who works in the orthopedic department who will forward a message to the office to see if they are able to get her in earlier than January.      Patient's presentation is most consistent with acute, uncomplicated illness.  FINAL CLINICAL IMPRESSION(S) / ED DIAGNOSES   Final diagnoses:  Chronic pain of both shoulders  Rotator cuff arthropathy, right     Rx / DC Orders   ED Discharge Orders     None        Note:  This document was prepared using Dragon voice recognition software and may include unintentional dictation errors.   [name], PA-C 03/04/22 1238    Duffy Bruce, MD 03/04/22 2029

## 2022-03-04 NOTE — Discharge Instructions (Signed)
Call the orthopedic department at [facility] on Monday.  They will be able to see your chart in the emergency department today.  They will be working on an appointment to get you in the office sooner than January.

## 2022-03-04 NOTE — ED Triage Notes (Signed)
R shoulder pain. Reports known 2 torn rotator cuff muscles. Pt has missed recent injections d/t other health concerns and is unable to get in until January. Pt reports worsening pain to shoulder. Denies new fall or injury .

## 2022-03-04 NOTE — ED Notes (Signed)
Bilateral torn rotator cuff tears. Hasn't had cortisone shots in near 8 months.

## 2022-03-08 ENCOUNTER — Other Ambulatory Visit: Payer: Self-pay | Admitting: Physician Assistant

## 2022-03-08 DIAGNOSIS — M79604 Pain in right leg: Secondary | ICD-10-CM

## 2022-03-08 DIAGNOSIS — I739 Peripheral vascular disease, unspecified: Secondary | ICD-10-CM

## 2022-03-10 ENCOUNTER — Ambulatory Visit
Admission: RE | Admit: 2022-03-10 | Discharge: 2022-03-10 | Disposition: A | Discharge status: Home / Self Care | Payer: 59 | Source: Ambulatory Visit | Attending: Physician Assistant | Admitting: Physician Assistant

## 2022-03-10 ENCOUNTER — Other Ambulatory Visit: Payer: Self-pay

## 2022-03-10 DIAGNOSIS — M79604 Pain in right leg: Secondary | ICD-10-CM | POA: Insufficient documentation

## 2022-03-10 DIAGNOSIS — I2510 Atherosclerotic heart disease of native coronary artery without angina pectoris: Secondary | ICD-10-CM | POA: Diagnosis not present

## 2022-03-10 DIAGNOSIS — R079 Chest pain, unspecified: Secondary | ICD-10-CM | POA: Diagnosis present

## 2022-03-10 DIAGNOSIS — I252 Old myocardial infarction: Secondary | ICD-10-CM | POA: Diagnosis not present

## 2022-03-10 DIAGNOSIS — I739 Peripheral vascular disease, unspecified: Secondary | ICD-10-CM | POA: Insufficient documentation

## 2022-03-10 DIAGNOSIS — I214 Non-ST elevation (NSTEMI) myocardial infarction: Secondary | ICD-10-CM | POA: Diagnosis not present

## 2022-03-10 DIAGNOSIS — F17210 Nicotine dependence, cigarettes, uncomplicated: Secondary | ICD-10-CM | POA: Diagnosis not present

## 2022-03-10 DIAGNOSIS — Z7902 Long term (current) use of antithrombotics/antiplatelets: Secondary | ICD-10-CM | POA: Diagnosis not present

## 2022-03-10 DIAGNOSIS — Y838 Other surgical procedures as the cause of abnormal reaction of the patient, or of later complication, without mention of misadventure at the time of the procedure: Secondary | ICD-10-CM | POA: Diagnosis not present

## 2022-03-10 DIAGNOSIS — F411 Generalized anxiety disorder: Secondary | ICD-10-CM | POA: Diagnosis not present

## 2022-03-10 DIAGNOSIS — I110 Hypertensive heart disease with heart failure: Secondary | ICD-10-CM | POA: Diagnosis not present

## 2022-03-10 DIAGNOSIS — T82855A Stenosis of coronary artery stent, initial encounter: Secondary | ICD-10-CM | POA: Diagnosis not present

## 2022-03-10 DIAGNOSIS — M797 Fibromyalgia: Secondary | ICD-10-CM | POA: Diagnosis not present

## 2022-03-10 DIAGNOSIS — E785 Hyperlipidemia, unspecified: Secondary | ICD-10-CM | POA: Diagnosis not present

## 2022-03-10 DIAGNOSIS — I5022 Chronic systolic (congestive) heart failure: Secondary | ICD-10-CM | POA: Diagnosis not present

## 2022-03-10 DIAGNOSIS — Z79899 Other long term (current) drug therapy: Secondary | ICD-10-CM | POA: Diagnosis not present

## 2022-03-13 ENCOUNTER — Observation Stay
Admission: EM | Admit: 2022-03-13 | Discharge: 2022-03-15 | Disposition: A | Discharge status: Home / Self Care | Payer: 59 | Attending: Internal Medicine | Admitting: Internal Medicine

## 2022-03-13 ENCOUNTER — Other Ambulatory Visit: Payer: Self-pay

## 2022-03-13 ENCOUNTER — Emergency Department: Payer: 59

## 2022-03-13 DIAGNOSIS — Z8616 Personal history of COVID-19: Secondary | ICD-10-CM

## 2022-03-13 DIAGNOSIS — E785 Hyperlipidemia, unspecified: Secondary | ICD-10-CM | POA: Diagnosis not present

## 2022-03-13 DIAGNOSIS — R0789 Other chest pain: Secondary | ICD-10-CM | POA: Diagnosis not present

## 2022-03-13 DIAGNOSIS — F411 Generalized anxiety disorder: Secondary | ICD-10-CM | POA: Diagnosis present

## 2022-03-13 DIAGNOSIS — H0266 Xanthelasma of left eye, unspecified eyelid: Secondary | ICD-10-CM | POA: Diagnosis present

## 2022-03-13 DIAGNOSIS — Z8249 Family history of ischemic heart disease and other diseases of the circulatory system: Secondary | ICD-10-CM

## 2022-03-13 DIAGNOSIS — I21A9 Other myocardial infarction type: Secondary | ICD-10-CM | POA: Diagnosis present

## 2022-03-13 DIAGNOSIS — R079 Chest pain, unspecified: Principal | ICD-10-CM | POA: Insufficient documentation

## 2022-03-13 DIAGNOSIS — I2582 Chronic total occlusion of coronary artery: Secondary | ICD-10-CM | POA: Diagnosis present

## 2022-03-13 DIAGNOSIS — M79661 Pain in right lower leg: Secondary | ICD-10-CM | POA: Diagnosis present

## 2022-03-13 DIAGNOSIS — I25112 Atherosclerotic heart disease of native coronary artery with refractory angina pectoris: Secondary | ICD-10-CM | POA: Diagnosis not present

## 2022-03-13 DIAGNOSIS — Y838 Other surgical procedures as the cause of abnormal reaction of the patient, or of later complication, without mention of misadventure at the time of the procedure: Secondary | ICD-10-CM | POA: Insufficient documentation

## 2022-03-13 DIAGNOSIS — H0263 Xanthelasma of right eye, unspecified eyelid: Secondary | ICD-10-CM | POA: Diagnosis present

## 2022-03-13 DIAGNOSIS — Z7902 Long term (current) use of antithrombotics/antiplatelets: Secondary | ICD-10-CM | POA: Diagnosis not present

## 2022-03-13 DIAGNOSIS — T82855A Stenosis of coronary artery stent, initial encounter: Secondary | ICD-10-CM | POA: Diagnosis present

## 2022-03-13 DIAGNOSIS — M797 Fibromyalgia: Secondary | ICD-10-CM | POA: Diagnosis present

## 2022-03-13 DIAGNOSIS — I959 Hypotension, unspecified: Secondary | ICD-10-CM | POA: Diagnosis present

## 2022-03-13 DIAGNOSIS — I952 Hypotension due to drugs: Secondary | ICD-10-CM | POA: Diagnosis not present

## 2022-03-13 DIAGNOSIS — I5042 Chronic combined systolic (congestive) and diastolic (congestive) heart failure: Secondary | ICD-10-CM | POA: Diagnosis present

## 2022-03-13 DIAGNOSIS — I10 Essential (primary) hypertension: Secondary | ICD-10-CM | POA: Diagnosis not present

## 2022-03-13 DIAGNOSIS — I2510 Atherosclerotic heart disease of native coronary artery without angina pectoris: Secondary | ICD-10-CM | POA: Diagnosis not present

## 2022-03-13 DIAGNOSIS — Z720 Tobacco use: Secondary | ICD-10-CM | POA: Diagnosis not present

## 2022-03-13 DIAGNOSIS — I252 Old myocardial infarction: Secondary | ICD-10-CM | POA: Diagnosis not present

## 2022-03-13 DIAGNOSIS — I110 Hypertensive heart disease with heart failure: Secondary | ICD-10-CM | POA: Insufficient documentation

## 2022-03-13 DIAGNOSIS — Z79899 Other long term (current) drug therapy: Secondary | ICD-10-CM | POA: Insufficient documentation

## 2022-03-13 DIAGNOSIS — F17210 Nicotine dependence, cigarettes, uncomplicated: Secondary | ICD-10-CM | POA: Diagnosis present

## 2022-03-13 DIAGNOSIS — I214 Non-ST elevation (NSTEMI) myocardial infarction: Secondary | ICD-10-CM | POA: Diagnosis not present

## 2022-03-13 DIAGNOSIS — D72829 Elevated white blood cell count, unspecified: Secondary | ICD-10-CM | POA: Diagnosis present

## 2022-03-13 DIAGNOSIS — I471 Supraventricular tachycardia, unspecified: Secondary | ICD-10-CM | POA: Diagnosis not present

## 2022-03-13 DIAGNOSIS — E876 Hypokalemia: Secondary | ICD-10-CM | POA: Diagnosis present

## 2022-03-13 DIAGNOSIS — Z7982 Long term (current) use of aspirin: Secondary | ICD-10-CM | POA: Diagnosis not present

## 2022-03-13 DIAGNOSIS — I255 Ischemic cardiomyopathy: Secondary | ICD-10-CM | POA: Diagnosis present

## 2022-03-13 DIAGNOSIS — R0689 Other abnormalities of breathing: Secondary | ICD-10-CM | POA: Diagnosis not present

## 2022-03-13 DIAGNOSIS — R0902 Hypoxemia: Secondary | ICD-10-CM | POA: Diagnosis not present

## 2022-03-13 DIAGNOSIS — I5022 Chronic systolic (congestive) heart failure: Secondary | ICD-10-CM | POA: Diagnosis not present

## 2022-03-13 DIAGNOSIS — R002 Palpitations: Secondary | ICD-10-CM | POA: Diagnosis not present

## 2022-03-13 LAB — APTT: aPTT: 26 seconds (ref 24–36)

## 2022-03-13 LAB — CBC
HCT: 40.2 % (ref 36.0–46.0)
Hemoglobin: 13.7 g/dL (ref 12.0–15.0)
MCH: 30.8 pg (ref 26.0–34.0)
MCHC: 34.1 g/dL (ref 30.0–36.0)
MCV: 90.3 fL (ref 80.0–100.0)
Platelets: 309 10*3/uL (ref 150–400)
RBC: 4.45 MIL/uL (ref 3.87–5.11)
RDW: 12.5 % (ref 11.5–15.5)
WBC: 15.9 10*3/uL — ABNORMAL HIGH (ref 4.0–10.5)
nRBC: 0 % (ref 0.0–0.2)

## 2022-03-13 LAB — BASIC METABOLIC PANEL
Anion gap: 5 (ref 5–15)
BUN: 6 mg/dL (ref 6–20)
CO2: 17 mmol/L — ABNORMAL LOW (ref 22–32)
Calcium: 6.7 mg/dL — ABNORMAL LOW (ref 8.9–10.3)
Chloride: 119 mmol/L — ABNORMAL HIGH (ref 98–111)
Creatinine, Ser: 0.65 mg/dL (ref 0.44–1.00)
GFR, Estimated: >60 mL/min (ref >60)
Glucose, Bld: 94 mg/dL (ref 70–99)
Potassium: 2.7 mmol/L — CL (ref 3.5–5.1)
Sodium: 141 mmol/L (ref 135–145)

## 2022-03-13 LAB — TROPONIN I (HIGH SENSITIVITY)
Troponin I (High Sensitivity): 936 ng/L (ref <18)
Troponin I (High Sensitivity): 2036 ng/L (ref <18)

## 2022-03-13 LAB — MAGNESIUM: Magnesium: 2 mg/dL (ref 1.7–2.4)

## 2022-03-13 LAB — PROTIME-INR
INR: 0.9 (ref 0.8–1.2)
Prothrombin Time: 12.5 seconds (ref 11.4–15.2)

## 2022-03-13 LAB — BRAIN NATRIURETIC PEPTIDE: B Natriuretic Peptide: 92.8 pg/mL (ref 0.0–100.0)

## 2022-03-13 MED ORDER — FENTANYL CITRATE PF 50 MCG/ML IJ SOSY: 50.0000 ug | PREFILLED_SYRINGE | Freq: Once | INTRAMUSCULAR | Status: DC

## 2022-03-13 MED ORDER — HEPARIN (PORCINE) 25000 UT/250ML-% IV SOLN
1250.0000 [IU]/h | INTRAVENOUS | Status: DC
  Administered 2022-03-13: 1250 [IU]/h via INTRAVENOUS
  Filled 2022-03-13: qty 250

## 2022-03-13 MED ORDER — ASPIRIN 81 MG PO CHEW: 324.0000 mg | CHEWABLE_TABLET | Freq: Once | ORAL | Status: DC

## 2022-03-13 MED ORDER — NITROGLYCERIN IN D5W 200-5 MCG/ML-% IV SOLN
0.0000 ug/min | INTRAVENOUS | Status: DC
  Administered 2022-03-13: 5 ug/min via INTRAVENOUS
  Filled 2022-03-13: qty 250

## 2022-03-13 MED ORDER — METOPROLOL TARTRATE 5 MG/5ML IV SOLN: 2.5000 mg | Freq: Once | INTRAVENOUS | Status: DC

## 2022-03-13 MED ORDER — SODIUM CHLORIDE 0.9 % IV BOLUS
500.0000 mL | Freq: Once | INTRAVENOUS | Status: AC
  Administered 2022-03-13: 500 mL via INTRAVENOUS

## 2022-03-13 MED ORDER — ALBUTEROL SULFATE (2.5 MG/3ML) 0.083% IN NEBU: 2.5000 mg | INHALATION_SOLUTION | Freq: Four times a day (QID) | RESPIRATORY_TRACT | Status: DC | PRN

## 2022-03-13 MED ORDER — ROSUVASTATIN CALCIUM 10 MG PO TABS
20.0000 mg | ORAL_TABLET | Freq: Every day | ORAL | Status: DC
  Filled 2022-03-13: qty 1

## 2022-03-13 MED ORDER — ALPRAZOLAM 0.5 MG PO TABS
0.5000 mg | ORAL_TABLET | Freq: Once | ORAL | Status: AC
  Administered 2022-03-13: 0.5 mg via ORAL
  Filled 2022-03-13: qty 1

## 2022-03-13 MED ORDER — POTASSIUM CHLORIDE CRYS ER 20 MEQ PO TBCR
40.0000 meq | EXTENDED_RELEASE_TABLET | Freq: Once | ORAL | Status: AC
  Administered 2022-03-13: 40 meq via ORAL
  Filled 2022-03-13: qty 2

## 2022-03-13 MED ORDER — CLOPIDOGREL BISULFATE 75 MG PO TABS
75.0000 mg | ORAL_TABLET | Freq: Every day | ORAL | Status: DC
  Administered 2022-03-13: 75 mg via ORAL
  Filled 2022-03-13: qty 1

## 2022-03-13 MED ORDER — ASPIRIN 81 MG PO TBEC
81.0000 mg | DELAYED_RELEASE_TABLET | Freq: Every day | ORAL | Status: DC
  Administered 2022-03-15: 81 mg via ORAL
  Filled 2022-03-13: qty 1

## 2022-03-13 MED ORDER — POTASSIUM CHLORIDE 10 MEQ/100ML IV SOLN
10.0000 meq | INTRAVENOUS | Status: AC
  Administered 2022-03-13 (×2): 10 meq via INTRAVENOUS
  Filled 2022-03-13 (×3): qty 100

## 2022-03-13 MED ORDER — ACETAMINOPHEN 325 MG PO TABS: 650.0000 mg | ORAL_TABLET | ORAL | Status: DC | PRN

## 2022-03-13 MED ORDER — ALPRAZOLAM 0.25 MG PO TABS
0.2500 mg | ORAL_TABLET | Freq: Three times a day (TID) | ORAL | Status: DC | PRN
  Administered 2022-03-14 – 2022-03-15 (×3): 0.25 mg via ORAL
  Filled 2022-03-13 (×3): qty 1

## 2022-03-13 MED ORDER — ONDANSETRON HCL 4 MG/2ML IJ SOLN
4.0000 mg | Freq: Four times a day (QID) | INTRAMUSCULAR | Status: DC | PRN
  Administered 2022-03-14: 4 mg via INTRAVENOUS
  Filled 2022-03-13: qty 2

## 2022-03-13 MED ORDER — DULOXETINE HCL 30 MG PO CPEP
30.0000 mg | ORAL_CAPSULE | Freq: Every day | ORAL | Status: DC
  Filled 2022-03-13: qty 1

## 2022-03-13 MED ORDER — HEPARIN BOLUS VIA INFUSION
4000.0000 [IU] | Freq: Once | INTRAVENOUS | Status: AC
  Administered 2022-03-13: 4000 [IU] via INTRAVENOUS
  Filled 2022-03-13: qty 4000

## 2022-03-13 MED ORDER — ALUM & MAG HYDROXIDE-SIMETH 200-200-20 MG/5ML PO SUSP
30.0000 mL | Freq: Once | ORAL | Status: AC
  Administered 2022-03-13: 30 mL via ORAL
  Filled 2022-03-13: qty 30

## 2022-03-13 NOTE — ED Provider Notes (Signed)
[facility] Provider Note    Event Date/Time   First MD Initiated Contact with Patient 03/13/22 1810     (approximate)   History   Chest Pain   HPI  TAYTUM SCHECK is a 46 y.o. female  here with CP. Pt reports that she has had some increased SOB throughout the day today. She took a nap this afternoon and woke up with severe, aching chest pressure and broke out in a cold sweat. She felt more SOB than usual. The CP has remained though mildly improved. She took a full dose of aspirin. She has a h/o CAD and MI and pain felt similar to prior ACS episode. Last cath was in July. She has been taking her meds including Plavix as prescribed. She does continue to smoke.       Physical Exam   Triage Vital Signs: ED Triage Vitals [03/13/22 1715]  Enc Vitals Group     BP 116/80     Pulse Rate 76     Resp 20     Temp      Temp src      SpO2 98 %     Weight      Height      Head Circumference      Peak Flow      Pain Score 5     Pain Loc      Pain Edu?      Excl. in [place]?     Most recent vital signs: Vitals:   03/13/22 2215 03/13/22 2224  BP: (!) 71/57   Pulse: 80   Resp: (!) 22   Temp:  98.1 F (36.7 C)  SpO2:       General: Awake, no distress.  CV:  Good peripheral perfusion. RRR. No murmurs. Resp:  Normal effort. Lungs CTAB. Abd:  No distention.  Other:  No LE edema.   ED Results / Procedures / Treatments   Labs (all labs ordered are listed, but only abnormal results are displayed) Labs Reviewed  BASIC METABOLIC PANEL - Abnormal; Notable for the following components:      Result Value   Potassium 2.7 (*)    Chloride 119 (*)    CO2 17 (*)    Calcium 6.7 (*)    All other components within normal limits  CBC - Abnormal; Notable for the following components:   WBC 15.9 (*)    All other components within normal limits  TROPONIN I (HIGH SENSITIVITY) - Abnormal; Notable for the following components:   Troponin I (High Sensitivity) 936  (*)    All other components within normal limits  TROPONIN I (HIGH SENSITIVITY) - Abnormal; Notable for the following components:   Troponin I (High Sensitivity) 2,036 (*)    All other components within normal limits  MAGNESIUM  BRAIN NATRIURETIC PEPTIDE  APTT  PROTIME-INR  URINE DRUG SCREEN, QUALITATIVE ([facility] ONLY)  HEPARIN LEVEL (UNFRACTIONATED)  CBC  HIV ANTIBODY (ROUTINE TESTING W REFLEX)  LIPID PANEL  BASIC METABOLIC PANEL  BASIC METABOLIC PANEL  POC URINE PREG, ED  TROPONIN I (HIGH SENSITIVITY)     EKG Normal sinus rhythm, VR 85. PR 172, QRS 80, QTc 476. No acute ST elevations or depressions. No ischemia or infarct.   RADIOLOGY CXR: No active disease   I also independently reviewed and agree with radiologist interpretations.   PROCEDURES:  Critical Care performed: Yes, see critical care procedure note(s)  .Critical Care  Performed by: Duffy Bruce, MD  Authorized by: Duffy Bruce, MD   Critical care provider statement:    Critical care time (minutes):  30   Critical care time was exclusive of:  Separately billable procedures and treating other patients   Critical care was necessary to treat or prevent imminent or life-threatening deterioration of the following conditions:  Respiratory failure, circulatory failure and cardiac failure   Critical care was time spent personally by me on the following activities:  Development of treatment plan with patient or surrogate, discussions with consultants, evaluation of patient's response to treatment, examination of patient, ordering and review of laboratory studies, ordering and review of radiographic studies, ordering and performing treatments and interventions, pulse oximetry, re-evaluation of patient's condition and review of [facility]: Medications  potassium chloride 10 mEq in 100 mL IVPB (0 mEq Intravenous Stopped 03/13/22 2302)  heparin ADULT infusion 100 units/mL (25000  units/238m) (1,250 Units/hr Intravenous New Bag/Given 03/13/22 1913)  nitroGLYCERIN 50 mg in dextrose 5 % 250 mL (0.2 mg/mL) infusion (0 mcg/min Intravenous Stopped 03/13/22 2210)  metoprolol tartrate (LOPRESSOR) injection 2.5 mg (0 mg Intravenous Hold 03/13/22 1921)  fentaNYL (SUBLIMAZE) injection 50 mcg (0 mcg Intravenous Hold 03/13/22 2012)  aspirin EC tablet 81 mg (has no administration in time range)  acetaminophen (TYLENOL) tablet 650 mg (has no administration in time range)  ondansetron (ZOFRAN) injection 4 mg (has no administration in time range)  clopidogrel (PLAVIX) tablet 75 mg (75 mg Oral Given 03/13/22 2301)  rosuvastatin (CRESTOR) tablet 20 mg (has no administration in time range)  DULoxetine (CYMBALTA) DR capsule 30 mg (has no administration in time range)  ALPRAZolam (XANAX) tablet 0.25 mg (has no administration in time range)  albuterol (VENTOLIN HFA) 108 (90 Base) MCG/ACT inhaler 1-2 puff (has no administration in time range)  potassium chloride SA (KLOR-CON M) CR tablet 40 mEq (40 mEq Oral Given 03/13/22 1902)  heparin bolus via infusion 4,000 Units (4,000 Units Intravenous Bolus from Bag 03/13/22 1915)  ALPRAZolam (XANAX) tablet 0.5 mg (0.5 mg Oral Given 03/13/22 1902)  sodium chloride 0.9 % bolus 500 mL (0 mLs Intravenous Stopped 03/13/22 2223)  potassium chloride SA (KLOR-CON M) CR tablet 40 mEq (40 mEq Oral Given 03/13/22 1923)  alum & mag hydroxide-simeth (MAALOX/MYLANTA) 200-200-20 MG/5ML suspension 30 mL (30 mLs Oral Given 03/13/22 2049)     IMPRESSION / MDM / [place]/ ED COURSE  I reviewed the triage vital signs and the nursing notes.                               This patient presents to the ED for concern of chest pain, this involves an extensive number of treatment options, and is a complaint that carries with it a high risk of complications and morbidity.  The differential diagnosis includes NSTEMI, CHF, PE, PTX, PNA, gastritis, PUD, pancreatitis   Co  morbidities that complicate the patient evaluation  Tobacco abuse H/o CAD s/p PCI HTN   Additional history obtained:  Additional history obtained from NA External records from outside source obtained and reviewed including prior Cardiology notes, cath from July   Lab Tests:  I Ordered, and personally interpreted labs.  The pertinent results include:   Trop 936 BMP with K 2.7, CO2 17 CBC with WBC 15.9, normal Hgb BNP normal   Imaging Studies ordered:  I ordered imaging studies including CXR  I independently visualized and interpreted  imaging which showed: CXR normal I agree with the radiologist interpretation   Cardiac Monitoring: / EKG:  The patient was maintained on a cardiac monitor.  I personally viewed and interpreted the cardiac monitored which showed an underlying rhythm of: NSR   Consultations Obtained:  I requested consultation with the Cardiologist Dr. Sung Amabile,  and discussed lab and imaging findings as well as pertinent plan - they recommend:  Multiple discussions with Cardiology, recommended iv metoprolol, nitro, morphine Discussed low BP concerns- they recommend IVF and meds as tolerated as long as MAP acceptable   Problem List / ED Course / Critical interventions / Medication management  NSTEMI EKG without ST elevations Trop >900, exam and history highly c/w NSTEMI CP resolved in ED No signs of dissection, labs o/w reassuring Case discussed with Cardiology extensively. Tx of her pain somewhat limited due to hypotension, which is near her baseline but limiting ability to use morphine, nitro, metoprolol. Will place on a low-dose nitro drip, heparin gtt, and admit to StepDown. Hospitalist consulted, plan to contact Cardiology with any recurrence of pain/change in EKG Serial EKGs showed no dynamic changes I ordered medication including IV heparin, IV nitroglycerin, fentanyl.  Reevaluation of the patient after these medicines showed that the patient  improved I have reviewed the patients home medicines and have made adjustments as needed   Social Determinants of Health:  Continues to smoke    Test / Admission - Considered:  Admit to stepdown   FINAL CLINICAL IMPRESSION(S) / ED DIAGNOSES   Final diagnoses:  NSTEMI (non-ST elevated myocardial infarction) ([place])     Rx / DC Orders   ED Discharge Orders     None        Note:  This document was prepared using Dragon voice recognition software and may include unintentional dictation errors.   Duffy Bruce, MD 03/13/22 2352

## 2022-03-13 NOTE — Consult Note (Signed)
ANTICOAGULATION CONSULT NOTE - Follow Up Consult  Pharmacy Consult for heparin gtt Indication: chest pain/ACS  Allergies  Allergen Reactions   Buspirone     Other reaction(s): Other (See Comments), Other (See Comments) Gi UPSET Gi UPSET Gi UPSET Other reaction(s): Other (See Comments) Gi UPSET Gi UPSET    Citalopram Nausea Only   Metrizamide Hives   Sulfa Antibiotics Hives   Contrast Media [Iodinated Contrast Media] Hives   Sulfa Drugs Cross Reactors    Sulfasalazine Other (See Comments)    Patient Measurements: Height: '5\' 1"'$  (154.9 cm) Weight: 84.4 kg (186 lb) IBW/kg (Calculated) : 47.8 Heparin Dosing Weight: 67.1 kg  Vital Signs: BP: 116/80 (11/06 1715) Pulse Rate: 76 (11/06 1715)  Labs: Recent Labs    03/13/22 1719  CREATININE 0.65  TROPONINIHS 936*    Estimated Creatinine Clearance: 86.6 mL/min (by C-G formula based on SCr of 0.65 mg/dL).   Medications: Heparin Dosing Weight: 67.1 kg PTA: Plavix '75mg'$  QD, no AC PTA Inpatient: Plavix continued; +Heparin gtt  Assessment: 46yo F w/ h/o MDD, HLD, HTN, sysCHF, CAD (MI in 06/2021 s/p stent & Cath in 11/2021), & LBP presenting with c/o CP. Pt on APT with plavix, no history of AC in chart review. Pharmacy consulted for the initiation and mgmt of heparin gtt.  Date Time aPTT/HL Rate/Comment       Baseline Labs: aPTT - ordered; INR - ordered Hgb - inprocess; Plts - pending  Goal of Therapy:  Heparin level 0.3-0.7 units/ml Monitor platelets by anticoagulation protocol: Yes   Plan:  Give 4000 units bolus x1; then start heparin infusion at 1250 units/hr Check anti-Xa level in 6 hours and daily once consecutively therapeutic. Continue to monitor H&H and platelets daily while on heparin gtt.  Shanon Brow Minahil Quinlivan 03/13/2022,6:22 PM

## 2022-03-13 NOTE — H&P (Signed)
History and Physical    Patient: Lisa Davila GLO:[id number] DOB: 1975-07-15 DOA: 03/13/2022 DOS: the patient was seen and examined on 03/14/2022 PCP: Tracie Harrier, MD  Patient coming from: Home  Chief Complaint:  Chief Complaint  Patient presents with   Chest Pain   HPI: Lisa Davila is a 46 y.o. female with medical history significant of CAD c/b STEMI s/p DES RCA (February 2023), HFpEF, SVT, hyperlipidemia, generalized anxiety, fibromyalgia, who presents to the ED with complaints of palpitations, diaphoresis and chest pain.  Ms. Nestler states she was in her normal state of health yesterday.  Today, she began to experience intermittent palpitations with associated shortness of breath.  Due to this, she went to take a nap and suddenly awoke at 4 PM on 11/6 with significant diaphoresis.  Due to concern this was an impending heart attack, she contacted EMS.  Once EMS arrived, she began to experience substernal chest pain that did not radiate.  Since arriving to the ED, she notes that her chest pain has improved with initiation of nitroglycerin.  She has not experienced any further episodes of palpitations.  Ms. Rengel states she has been taking her medications as prescribed and has not missed any recent doses.  She continues to smoke approximately 1 pack/day.  ED course On arrival to the ED, patient was afebrile at 98.1 with blood pressure of 116/80 and heart rate of 76.  She was saturating at 98% on room air.  Initial work-up remarkable for potassium of 2.7, bicarb of 17.  Initial troponin elevated at 936 and further increased to 2036.  WBC elevated at 15.9.  EKG with no acute ST or T wave changes.  Cardiology consulted and recommending medical management at this time.  [facility] contacted for admission.  Review of Systems: As mentioned in the history of present illness. All other systems reviewed and are negative. Past Medical History:  Diagnosis Date   Acute ethmoidal sinusitis     Acute ethmoidal sinusitis    Acute maxillary sinusitis    Acute upper respiratory infections of unspecified site    Anxiety state, unspecified    Chest pain, unspecified    CHF (congestive heart failure) ([place])    Coronary artery disease    Depression    Diarrhea    Esophageal reflux    Generalized anxiety disorder    Hyperlipidemia    Hypertension    Hypoglycemia, unspecified    Lumbago    Nausea with vomiting    Obstructive chronic bronchitis with exacerbation (HCC)    Other and unspecified hyperlipidemia    Other and unspecified peripheral vertigo(386.19)    Other bursitis disorders    Other malaise and fatigue    Other specified diseases due to viruses    Other vitamin B12 deficiency anemia    Pain in limb    Pernicious anemia    Sleep related hypoventilation/hypoxemia in conditions classifiable elsewhere    SVT (supraventricular tachycardia)    Swelling, mass, or lump in head and neck    Tobacco use disorder    Unspecified disorder of external ear    Unspecified disorder of skin and subcutaneous tissue    Unspecified otitis media    Past Surgical History:  Procedure Laterality Date   CESAREAN SECTION     CORONARY/GRAFT ACUTE MI REVASCULARIZATION N/A 07/04/2021   Procedure: Coronary/Graft Acute MI Revascularization;  Surgeon: Wellington Hampshire, MD;  Location: [facility];  Service: Cardiovascular;  Laterality: N/A;   LEFT HEART  CATH AND CORONARY ANGIOGRAPHY N/A 07/04/2021   Procedure: LEFT HEART CATH AND CORONARY ANGIOGRAPHY;  Surgeon: Wellington Hampshire, MD;  Location: [facility];  Service: Cardiovascular;  Laterality: N/A;   RIGHT/LEFT HEART CATH AND CORONARY ANGIOGRAPHY N/A 11/07/2021   Procedure: RIGHT/LEFT HEART CATH AND CORONARY ANGIOGRAPHY;  Surgeon: Wellington Hampshire, MD;  Location: [facility];  Service: Cardiovascular;  Laterality: N/A;   Social History:  reports that she has been smoking cigarettes. She has never used smokeless  tobacco. She reports that she does not drink alcohol and does not use drugs.  Allergies  Allergen Reactions   Buspirone     Other reaction(s): Other (See Comments), Other (See Comments) Gi UPSET Gi UPSET Gi UPSET Other reaction(s): Other (See Comments) Gi UPSET Gi UPSET    Citalopram Nausea Only   Metrizamide Hives   Sulfa Antibiotics Hives   Contrast Media [Iodinated Contrast Media] Hives   Sulfa Drugs Cross Reactors    Sulfasalazine Other (See Comments)    Family History  Problem Relation Age of Onset   Depression Mother    Cancer Father        Father   Hypertension Sister    Breast cancer Maternal Grandmother    Cancer Paternal Grandfather        Breast     Prior to Admission medications   Medication Sig Start Date End Date Taking? Authorizing Provider  aspirin 81 MG chewable tablet Chew 1 tablet (81 mg total) by mouth daily. 07/06/21  Yes Sreenath, Sudheer B, MD  atorvastatin (LIPITOR) 80 MG tablet Take 1 tablet (80 mg total) by mouth once daily 08/30/21  Yes   clopidogrel (PLAVIX) 75 MG tablet Take 1 tablet (75 mg total) by mouth daily. 01/10/22  Yes Wellington Hampshire, MD  cyanocobalamin (VITAMIN B12) 1000 MCG tablet Take 1 tablet (1,000 mcg total) by mouth once daily 01/27/22  Yes   dapagliflozin propanediol (FARXIGA) 10 MG TABS tablet Take 1 tablet (10 mg total) by mouth daily before breakfast. 12/22/21  Yes Hammock, Sheri, NP  DULoxetine (CYMBALTA) 30 MG capsule Take 1 capsule (30 mg total) by mouth once daily 12/06/21  Yes   eplerenone (INSPRA) 25 MG tablet Take 1 tablet (25 mg total) by mouth daily. 11/15/21  Yes Wellington Hampshire, MD  ergocalciferol (VITAMIN D2) 1.25 MG (50000 UT) capsule Take one capsule by mouth once a week 09/12/21  Yes   furosemide (LASIX) 40 MG tablet Take 1 tablet (40 mg total) by mouth daily. 01/10/22  Yes Wellington Hampshire, MD  ipratropium-albuterol (DUONEB) 0.5-2.5 (3) MG/3ML SOLN Take 3 mls by nebulization 4 (four) times daily for 30 days 07/29/21   Yes Fields, Glenda L, NP  medroxyPROGESTERone (DEPO-PROVERA) 150 MG/ML injection Inject into the muscle. 03/06/22  Yes [provider]  metoprolol succinate (TOPROL-XL) 50 MG 24 hr tablet Take 1 tablet (50 mg total) by mouth daily. 11/07/21  Yes Wellington Hampshire, MD  potassium chloride (KLOR-CON) 10 MEQ tablet Take 4 tablets (40 mEq total) by mouth daily. 01/18/22  Yes Hackney, Otila Kluver A, FNP  spironolactone (ALDACTONE) 25 MG tablet Take 25 mg by mouth daily. 09/12/21 09/12/22 Yes [provider]  topiramate (TOPAMAX) 25 MG tablet Take 25 mg by mouth daily. 07/26/21 07/26/22 Yes [provider]  albuterol (VENTOLIN HFA) 108 (90 Base) MCG/ACT inhaler Inhale 2 puffs into the lungs every 6 hours as needed for wheezing. 12/13/21     ALPRAZolam (XANAX) 0.5 MG tablet Take  1 tablet (0.5 mg total) by mouth 3 (three) times daily as needed for Sleep 01/27/22     nicotine (NICODERM CQ - DOSED IN MG/24 HOURS) 14 mg/24hr patch Place 1 patch onto the skin daily for 14 days Patient not taking: Reported on 03/13/2022 10/04/21     nitroGLYCERIN (NITROSTAT) 0.4 MG SL tablet Place 1 tablet (0.4 mg total) under the tongue every 5 (five) minutes as needed for chest pain. Patient not taking: Reported on 03/13/2022 07/05/21   Sidney Ace, MD    Physical Exam: Vitals:   03/13/22 2145 03/13/22 2200 03/13/22 2215 03/13/22 2224  BP: 94/61 (!) 81/55 (!) 71/57   Pulse: 75 80 80   Resp: 18 16 (!) 22   Temp:    98.1 F (36.7 C)  TempSrc:    Oral  SpO2:      Weight:      Height:       Physical Exam Vitals and nursing note reviewed.  Constitutional:      General: She is not in acute distress.    Appearance: She is obese. She is not toxic-appearing.  HENT:     Head: Normocephalic and atraumatic.  Eyes:     Extraocular Movements: Extraocular movements intact.     Pupils: Pupils are equal, round, and reactive to light.  Cardiovascular:     Rate and Rhythm: Normal rate and regular rhythm.      Heart sounds:     No systolic murmur is present.     No diastolic murmur is present.  Pulmonary:     Effort: Pulmonary effort is normal. No tachypnea.     Breath sounds: No decreased breath sounds, wheezing, rhonchi or rales.  Abdominal:     General: Bowel sounds are normal.     Palpations: Abdomen is soft.  Musculoskeletal:     Cervical back: Neck supple.     Right lower leg: No tenderness. No edema.     Left lower leg: No tenderness. No edema.  Skin:    General: Skin is warm and dry.  Neurological:     General: No focal deficit present.     Mental Status: She is alert and oriented to person, place, and time.  Psychiatric:        Mood and Affect: Mood is anxious.        Behavior: Behavior normal.    Data Reviewed: CBC with elevated WBC at 15.9, BMP with potassium of 2.7 with hemolysis noted, chloride of 119, bicarb of 17, creatinine of 0.65, calcium of 6.7.  Initial troponin elevated at 936 with further rise to 2036.  BNP within normal limits.  PT/INR PTT within normal limits.  Magnesium within normal limits.    EKG personally reviewed.  Consistent with sinus rhythm with rate of 72.  Q waves noted in leads II, III and aVF, in addition to smaller Q waves in V5 and V6.  Flattening of T wave noted.  No other acute ST or T wave changes.  DG Chest 2 View  Result Date: 03/13/2022 CLINICAL DATA:  Chest pain EXAM: CHEST - 2 VIEW COMPARISON:  10/25/2021 FINDINGS: The heart size and mediastinal contours are within normal limits. Both lungs are clear. The visualized skeletal structures are unremarkable. IMPRESSION: No active cardiopulmonary disease. Electronically Signed   By: Donavan Foil M.D.   On: 03/13/2022 17:40    Results are pending, will review when available.  Assessment and Plan: * NSTEMI (non-ST elevated myocardial infarction) [facility]) Patient presenting  with 1 day history of intermittent palpitations, sudden onset diaphoresis and subsequent up with set of chest pain.  Troponins  are markedly elevated with no evidence of ST elevation on EKG consistent with NSTEMI.  Patient is at high risk for  obstructive CAD given recent history of STEMI with RCA DES, continued daily tobacco use, uncontrolled hyperlipidemia.  Cardiology has been consulted; discussed with Dr. Fletcher Anon and Dr. Haroldine Laws.  Recommending IV nitroglycerin and metoprolol as blood pressure tolerates, heparin infusion and plan for left heart cath tomorrow morning (11/7 AM).   - Heparin infusion per pharmacy protocol - Continue IV nitroglycerin as blood pressure tolerates.  Discontinue for MAP less than 65 - S/p one-time dose of IV metoprolol - Continue home aspirin and Plavix - Repeat EKG for any reoccurrence of chest pain - N.p.o. at midnight  Hypotension Patient initially normotensive, however developed subsequent hypotension in the setting of IV nitroglycerin.  She is asymptomatic at this time  - Continue IV nitroglycerin as blood pressure tolerates.  Hold for MAP < 65 - Hold home antihypertensives  SVT (supraventricular tachycardia) Patient has a history of prior SVTs with reported intermittent palpitations today.  - Telemetry monitoring  Coronary artery disease History of CAD C/B STEMI s/p DES to RCA.  Most recent heart cath in July 2023 demonstrated proximal RCA stent with mild in-stent restenosis, moderately reduced LV function with EF of 35% with global hypokinesis and minimal pulmonary hypertension.  - Management of NSTEMI as noted above  Leukocytosis Likely reactive in the setting of NSTEMI, however WBC has been elevated on the last 3 checks.  Recommend outpatient follow-up with PCP to ensure resolution.  Chronic systolic heart failure (HCC) Echocardiogram obtained in February 2023 demonstrated EF of 50-55% with no regional wall motion abnormalities, however left and right heart cath in July 2023 demonstrated EF of 35% with global hypokinesis, more prominent in the inferior apical area.  GDMT  includes Farxiga, eplerenone, metoprolol.  Patient currently not on an ARB.  Patient appears euvolemic at this time.  - Holding home GDMT in the setting of hypotension  Generalized anxiety disorder - Restart home Xanax at decreased dose due to hypotension  Hypertension - Holding home antihypertensives due to hypotension  Tobacco user - Discussed importance of smoking cessation to prevent future cardiac complications.  Patient expressed understanding.  Hyperlipidemia Patient has a long-term history of hyperlipidemia dating back to 2013.  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  Currently on atorvastatin 80 mg daily for the past 5 months with LDL still elevated at 156.  I suspect patient may benefit from the addition of Zetia, and potentially even a PCSK9 inhibitor, however that is financially prohibitive at this time the setting of health insurance loss.  - Per patient preference, we will switch from high intensity atorvastatin to high intensity rosuvastatin - Consider addition of Zetia prior to discharge - Repeat lipid panel pending  Advance Care Planning:   Code Status: Full Code   Consults: Cardiology  Family Communication: Patient's sisters and mother updated at bedside  Severity of Illness: The appropriate patient status for this patient is INPATIENT. Inpatient status is judged to be reasonable and necessary in order to provide the required intensity of service to ensure the patient's safety. The patient's presenting symptoms, physical exam findings, and initial radiographic and laboratory data in the context of their chronic comorbidities is felt to place them at high risk for further clinical deterioration. Furthermore, it is not anticipated that the patient will  be medically stable for discharge from the hospital within 2 midnights of admission.   * I certify that at the point of admission it is my clinical judgment that the patient will require inpatient  hospital care spanning beyond 2 midnights from the point of admission due to high intensity of service, high risk for further deterioration and high frequency of surveillance required.*  Author: Jose Persia, MD 03/14/2022 1:01 AM  For on call review [email or web address].

## 2022-03-13 NOTE — ED Provider Triage Note (Signed)
Emergency Medicine Provider Triage Evaluation Note  Lisa Davila , a 46 y.o. female  was evaluated in triage.  Pt complains of chest pain and hypotension.that started this afternoon. She has had palpitations all day and decided to take a nap. She woke up sweaty and felt bad like she did when she had her heart attack. She has taken medications as prescribed. EMS gave fluids with improvement of blood pressure.   Physical Exam  BP 116/80 (BP Location: Left Arm)   Pulse 76   Resp 20   SpO2 98%  Gen:   Awake, no distress   Resp:  Normal effort  MSK:   Moves extremities without difficulty  Other:    Medical Decision Making  Medically screening exam initiated at 5:19 PM.  Appropriate orders placed.  LAKENZIE MCCLAFFERTY was informed that the remainder of the evaluation will be completed by another provider, this initial triage assessment does not replace that evaluation, and the importance of remaining in the ED until their evaluation is complete.  Blood pressure now stable. Patient continues to complain of palpitations and chest pain 5/10. ER bed requested.   Victorino Dike, FNP 03/13/22 1724

## 2022-03-13 NOTE — ED Triage Notes (Addendum)
Pt to ED via [facility] from home c/o CP. Pt states she was napping and woke up sweaty and having palpations. Pt reports chest pressure on left side of her chest and is non-radiating. Pt with hx MI in February 2023 with stent placement and another cath in July 2023. Pt states symptoms feel the same has prior MI. Pt on blood thinner.   Initial BP 86/50 and repeat 95/55. 20g RAC and given fluids in route. CP has relieved some PTA. Pt placed in subwait and waiting for next bed available.

## 2022-03-14 ENCOUNTER — Other Ambulatory Visit: Payer: Self-pay

## 2022-03-14 ENCOUNTER — Inpatient Hospital Stay (HOSPITAL_COMMUNITY)
Admit: 2022-03-14 | Discharge: 2022-03-14 | Disposition: A | Discharge status: Home / Self Care | Payer: 59 | Attending: Physician Assistant | Admitting: Physician Assistant

## 2022-03-14 ENCOUNTER — Encounter
Admission: EM | Disposition: A | Discharge status: Home / Self Care | Payer: Self-pay | Source: Home / Self Care | Attending: Internal Medicine

## 2022-03-14 ENCOUNTER — Encounter: Payer: Self-pay | Admitting: Internal Medicine

## 2022-03-14 DIAGNOSIS — I2510 Atherosclerotic heart disease of native coronary artery without angina pectoris: Secondary | ICD-10-CM | POA: Diagnosis not present

## 2022-03-14 DIAGNOSIS — I214 Non-ST elevation (NSTEMI) myocardial infarction: Secondary | ICD-10-CM | POA: Diagnosis not present

## 2022-03-14 DIAGNOSIS — I471 Supraventricular tachycardia, unspecified: Secondary | ICD-10-CM

## 2022-03-14 DIAGNOSIS — I5022 Chronic systolic (congestive) heart failure: Secondary | ICD-10-CM | POA: Diagnosis not present

## 2022-03-14 HISTORY — PX: CORONARY THROMBECTOMY: CATH118304

## 2022-03-14 HISTORY — PX: LEFT HEART CATH AND CORONARY ANGIOGRAPHY: CATH118249

## 2022-03-14 HISTORY — PX: CORONARY STENT INTERVENTION: CATH118234

## 2022-03-14 LAB — BASIC METABOLIC PANEL
Anion gap: 3 — ABNORMAL LOW (ref 5–15)
BUN: 6 mg/dL (ref 6–20)
CO2: 20 mmol/L — ABNORMAL LOW (ref 22–32)
Calcium: 8.7 mg/dL — ABNORMAL LOW (ref 8.9–10.3)
Chloride: 115 mmol/L — ABNORMAL HIGH (ref 98–111)
Creatinine, Ser: 0.76 mg/dL (ref 0.44–1.00)
GFR, Estimated: >60 mL/min (ref >60)
Glucose, Bld: 98 mg/dL (ref 70–99)
Potassium: 4.2 mmol/L (ref 3.5–5.1)
Sodium: 138 mmol/L (ref 135–145)

## 2022-03-14 LAB — CBC
HCT: 34.7 % — ABNORMAL LOW (ref 36.0–46.0)
Hemoglobin: 11.9 g/dL — ABNORMAL LOW (ref 12.0–15.0)
MCH: 31.4 pg (ref 26.0–34.0)
MCHC: 34.3 g/dL (ref 30.0–36.0)
MCV: 91.6 fL (ref 80.0–100.0)
Platelets: 264 10*3/uL (ref 150–400)
RBC: 3.79 MIL/uL — ABNORMAL LOW (ref 3.87–5.11)
RDW: 12.7 % (ref 11.5–15.5)
WBC: 8.9 10*3/uL (ref 4.0–10.5)
nRBC: 0 % (ref 0.0–0.2)

## 2022-03-14 LAB — POCT ACTIVATED CLOTTING TIME
Activated Clotting Time: 251 seconds
Activated Clotting Time: 275 seconds

## 2022-03-14 LAB — ECHOCARDIOGRAM COMPLETE
AR max vel: 1.69 cm2
AV Area VTI: 1.8 cm2
AV Area mean vel: 1.53 cm2
AV Mean grad: 4.3 mmHg
AV Peak grad: 6.8 mmHg
Ao pk vel: 1.3 m/s
Area-P 1/2: 3.83 cm2
Height: 61 in
S' Lateral: 3.4 cm
Weight: 2976 oz

## 2022-03-14 LAB — BASIC METABOLIC PANEL WITH GFR
Anion gap: 5 (ref 5–15)
BUN: 6 mg/dL (ref 6–20)
CO2: 20 mmol/L — ABNORMAL LOW (ref 22–32)
Calcium: 8.5 mg/dL — ABNORMAL LOW (ref 8.9–10.3)
Chloride: 113 mmol/L — ABNORMAL HIGH (ref 98–111)
Creatinine, Ser: 0.75 mg/dL (ref 0.44–1.00)
GFR, Estimated: >60 mL/min
Glucose, Bld: 106 mg/dL — ABNORMAL HIGH (ref 70–99)
Potassium: 4.3 mmol/L (ref 3.5–5.1)
Sodium: 138 mmol/L (ref 135–145)

## 2022-03-14 LAB — LIPID PANEL
Cholesterol: 201 mg/dL — ABNORMAL HIGH (ref 0–200)
HDL: 28 mg/dL — ABNORMAL LOW
LDL Cholesterol: 137 mg/dL — ABNORMAL HIGH (ref 0–99)
Total CHOL/HDL Ratio: 7.2 ratio
Triglycerides: 180 mg/dL — ABNORMAL HIGH
VLDL: 36 mg/dL (ref 0–40)

## 2022-03-14 LAB — HEPARIN LEVEL (UNFRACTIONATED)
Heparin Unfractionated: 0.53 IU/mL (ref 0.30–0.70)
Heparin Unfractionated: 0.42 IU/mL (ref 0.30–0.70)

## 2022-03-14 LAB — HIV ANTIBODY (ROUTINE TESTING W REFLEX): HIV Screen 4th Generation wRfx: NONREACTIVE

## 2022-03-14 LAB — TROPONIN I (HIGH SENSITIVITY): Troponin I (High Sensitivity): 5742 ng/L (ref <18)

## 2022-03-14 SURGERY — LEFT HEART CATH AND CORONARY ANGIOGRAPHY
Anesthesia: Moderate Sedation

## 2022-03-14 MED ORDER — TOPIRAMATE 25 MG PO TABS: 25.0000 mg | ORAL_TABLET | Freq: Every day | ORAL | Status: DC

## 2022-03-14 MED ORDER — ENOXAPARIN SODIUM 40 MG/0.4ML IJ SOSY
40.0000 mg | PREFILLED_SYRINGE | INTRAMUSCULAR | Status: DC
  Filled 2022-03-14 (×3): qty 0.4

## 2022-03-14 MED ORDER — HEPARIN SODIUM (PORCINE) 1000 UNIT/ML IJ SOLN
INTRAMUSCULAR | Status: AC
  Filled 2022-03-14: qty 10

## 2022-03-14 MED ORDER — HEPARIN SODIUM (PORCINE) 1000 UNIT/ML IJ SOLN
INTRAMUSCULAR | Status: DC | PRN
  Administered 2022-03-14: 3000 [IU] via INTRAVENOUS
  Administered 2022-03-14: 4000 [IU] via INTRAVENOUS

## 2022-03-14 MED ORDER — NITROGLYCERIN 1 MG/10 ML FOR IR/CATH LAB
INTRA_ARTERIAL | Status: AC
  Filled 2022-03-14: qty 10

## 2022-03-14 MED ORDER — TIROFIBAN (AGGRASTAT) BOLUS VIA INFUSION
INTRAVENOUS | Status: DC | PRN
  Administered 2022-03-14: 2110 ug via INTRAVENOUS

## 2022-03-14 MED ORDER — VERAPAMIL HCL 2.5 MG/ML IV SOLN
INTRAVENOUS | Status: DC | PRN
  Administered 2022-03-14 (×2): 2.5 mg via INTRA_ARTERIAL

## 2022-03-14 MED ORDER — DIPHENHYDRAMINE HCL 25 MG PO CAPS: 50.0000 mg | ORAL_CAPSULE | Freq: Once | ORAL | Status: AC

## 2022-03-14 MED ORDER — TIROFIBAN HCL IN NACL 5-0.9 MG/100ML-% IV SOLN
INTRAVENOUS | Status: DC | PRN
  Administered 2022-03-14: .15 ug/kg/min via INTRAVENOUS

## 2022-03-14 MED ORDER — SODIUM CHLORIDE 0.9 % IV SOLN: 250.0000 mL | INTRAVENOUS | Status: DC | PRN

## 2022-03-14 MED ORDER — METHYLPREDNISOLONE SODIUM SUCC 125 MG IJ SOLR
INTRAMUSCULAR | Status: AC
  Filled 2022-03-14: qty 2

## 2022-03-14 MED ORDER — LIDOCAINE HCL (PF) 1 % IJ SOLN
INTRAMUSCULAR | Status: DC | PRN
  Administered 2022-03-14: 2 mL

## 2022-03-14 MED ORDER — PRASUGREL HCL 10 MG PO TABS
ORAL_TABLET | ORAL | Status: AC
  Filled 2022-03-14: qty 6

## 2022-03-14 MED ORDER — ONDANSETRON HCL 4 MG/2ML IJ SOLN
INTRAMUSCULAR | Status: DC | PRN
  Administered 2022-03-14: 4 mg via INTRAVENOUS

## 2022-03-14 MED ORDER — VERAPAMIL HCL 2.5 MG/ML IV SOLN
INTRAVENOUS | Status: AC
  Filled 2022-03-14: qty 2

## 2022-03-14 MED ORDER — METHYLPREDNISOLONE SODIUM SUCC 40 MG IJ SOLR
40.0000 mg | INTRAMUSCULAR | Status: DC
  Administered 2022-03-14 (×2): 40 mg via INTRAVENOUS
  Filled 2022-03-14: qty 1

## 2022-03-14 MED ORDER — DIPHENHYDRAMINE HCL 50 MG/ML IJ SOLN
50.0000 mg | Freq: Once | INTRAMUSCULAR | Status: AC
  Administered 2022-03-14: 50 mg via INTRAVENOUS

## 2022-03-14 MED ORDER — SODIUM CHLORIDE 0.9 % WEIGHT BASED INFUSION: 1.0000 mL/kg/h | INTRAVENOUS | Status: DC

## 2022-03-14 MED ORDER — TIROFIBAN HCL IV 12.5 MG/250 ML
INTRAVENOUS | Status: AC
  Filled 2022-03-14: qty 250

## 2022-03-14 MED ORDER — ALUM & MAG HYDROXIDE-SIMETH 200-200-20 MG/5ML PO SUSP
30.0000 mL | ORAL | Status: DC | PRN
  Administered 2022-03-14: 30 mL via ORAL
  Filled 2022-03-14: qty 30

## 2022-03-14 MED ORDER — ASPIRIN 81 MG PO CHEW: 81.0000 mg | CHEWABLE_TABLET | ORAL | Status: AC

## 2022-03-14 MED ORDER — MIDAZOLAM HCL 2 MG/2ML IJ SOLN
INTRAMUSCULAR | Status: AC
  Filled 2022-03-14: qty 2

## 2022-03-14 MED ORDER — HEPARIN (PORCINE) IN NACL 1000-0.9 UT/500ML-% IV SOLN
INTRAVENOUS | Status: DC | PRN
  Administered 2022-03-14: 1000 mL
  Administered 2022-03-14: 500 mL

## 2022-03-14 MED ORDER — TIROFIBAN HCL IN NACL 5-0.9 MG/100ML-% IV SOLN
0.1500 ug/kg/min | INTRAVENOUS | Status: AC
  Filled 2022-03-14: qty 100

## 2022-03-14 MED ORDER — ALPRAZOLAM 0.5 MG PO TABS
ORAL_TABLET | ORAL | Status: AC
  Administered 2022-03-14: 0.25 mg via ORAL
  Filled 2022-03-14: qty 1

## 2022-03-14 MED ORDER — ONDANSETRON HCL 4 MG/2ML IJ SOLN
INTRAMUSCULAR | Status: AC
  Filled 2022-03-14: qty 2

## 2022-03-14 MED ORDER — NITROGLYCERIN 1 MG/10 ML FOR IR/CATH LAB
INTRA_ARTERIAL | Status: DC | PRN
  Administered 2022-03-14: 100 ug via INTRACORONARY
  Administered 2022-03-14: 200 ug via INTRA_ARTERIAL
  Administered 2022-03-14: 100 ug via INTRACORONARY
  Administered 2022-03-14: 100 ug via INTRA_ARTERIAL

## 2022-03-14 MED ORDER — LIDOCAINE HCL 1 % IJ SOLN
INTRAMUSCULAR | Status: AC
  Filled 2022-03-14: qty 20

## 2022-03-14 MED ORDER — HEPARIN (PORCINE) IN NACL 1000-0.9 UT/500ML-% IV SOLN
INTRAVENOUS | Status: AC
  Filled 2022-03-14: qty 1000

## 2022-03-14 MED ORDER — FENTANYL CITRATE (PF) 100 MCG/2ML IJ SOLN
INTRAMUSCULAR | Status: AC
  Filled 2022-03-14: qty 2

## 2022-03-14 MED ORDER — SODIUM CHLORIDE 0.9% FLUSH: 3.0000 mL | INTRAVENOUS | Status: DC | PRN

## 2022-03-14 MED ORDER — PERFLUTREN LIPID MICROSPHERE
1.0000 mL | INTRAVENOUS | Status: AC | PRN
  Administered 2022-03-14: 2 mL via INTRAVENOUS

## 2022-03-14 MED ORDER — MIDAZOLAM HCL 2 MG/2ML IJ SOLN
INTRAMUSCULAR | Status: DC | PRN
  Administered 2022-03-14 (×3): .5 mg via INTRAVENOUS

## 2022-03-14 MED ORDER — ALBUTEROL SULFATE HFA 108 (90 BASE) MCG/ACT IN AERS
1.0000 | INHALATION_SPRAY | Freq: Four times a day (QID) | RESPIRATORY_TRACT | Status: DC | PRN
  Administered 2022-03-14 (×2): 2 via RESPIRATORY_TRACT
  Filled 2022-03-14: qty 6.7

## 2022-03-14 MED ORDER — PRASUGREL HCL 10 MG PO TABS
ORAL_TABLET | ORAL | Status: DC | PRN
  Administered 2022-03-14: 60 mg via ORAL

## 2022-03-14 MED ORDER — METOPROLOL TARTRATE 25 MG PO TABS
12.5000 mg | ORAL_TABLET | Freq: Two times a day (BID) | ORAL | Status: DC
  Administered 2022-03-14: 12.5 mg via ORAL
  Filled 2022-03-14: qty 1

## 2022-03-14 MED ORDER — FENTANYL CITRATE (PF) 100 MCG/2ML IJ SOLN
INTRAMUSCULAR | Status: DC | PRN
  Administered 2022-03-14 (×2): 12.5 ug via INTRAVENOUS

## 2022-03-14 MED ORDER — SODIUM CHLORIDE 0.9% FLUSH
3.0000 mL | Freq: Two times a day (BID) | INTRAVENOUS | Status: DC
  Administered 2022-03-14 – 2022-03-15 (×2): 3 mL via INTRAVENOUS

## 2022-03-14 MED ORDER — SODIUM CHLORIDE 0.9% FLUSH: 3.0000 mL | Freq: Two times a day (BID) | INTRAVENOUS | Status: DC

## 2022-03-14 MED ORDER — SODIUM CHLORIDE 0.9 % WEIGHT BASED INFUSION
3.0000 mL/kg/h | INTRAVENOUS | Status: DC
  Administered 2022-03-14: 3 mL/kg/h via INTRAVENOUS

## 2022-03-14 MED ORDER — PRASUGREL HCL 10 MG PO TABS
10.0000 mg | ORAL_TABLET | Freq: Every day | ORAL | Status: DC
  Administered 2022-03-15: 10 mg via ORAL
  Filled 2022-03-14: qty 1

## 2022-03-14 MED ORDER — DIPHENHYDRAMINE HCL 50 MG/ML IJ SOLN
INTRAMUSCULAR | Status: AC
  Filled 2022-03-14: qty 1

## 2022-03-14 MED ORDER — SODIUM CHLORIDE 0.9 % IV SOLN: INTRAVENOUS | Status: AC

## 2022-03-14 MED ORDER — ASPIRIN 81 MG PO CHEW
CHEWABLE_TABLET | ORAL | Status: AC
  Administered 2022-03-14: 81 mg via ORAL
  Filled 2022-03-14: qty 1

## 2022-03-14 MED ORDER — STERILE WATER FOR INJECTION IJ SOLN
INTRAMUSCULAR | Status: AC
  Filled 2022-03-14: qty 10

## 2022-03-14 MED ORDER — HYDRALAZINE HCL 20 MG/ML IJ SOLN: 10.0000 mg | INTRAMUSCULAR | Status: AC | PRN

## 2022-03-14 MED ORDER — NICOTINE 21 MG/24HR TD PT24
21.0000 mg | MEDICATED_PATCH | Freq: Every day | TRANSDERMAL | Status: DC
  Administered 2022-03-14 – 2022-03-15 (×2): 21 mg via TRANSDERMAL
  Filled 2022-03-14 (×2): qty 1

## 2022-03-14 MED ORDER — IOHEXOL 300 MG/ML  SOLN
INTRAMUSCULAR | Status: DC | PRN
  Administered 2022-03-14: 87 mL

## 2022-03-14 MED ORDER — TIROFIBAN HCL IN NACL 5-0.9 MG/100ML-% IV SOLN
INTRAVENOUS | Status: AC
  Filled 2022-03-14: qty 100

## 2022-03-14 SURGICAL SUPPLY — 20 items
BALLN MINITREK RX 2.0X15 (BALLOONS) ×1
BALLN ~~LOC~~ TREK NEO RX 3.5X15 (BALLOONS) ×1
BALLN ~~LOC~~ TREK NEO RX 4.0X12 (BALLOONS) IMPLANT
BALLOON MINITREK RX 2.0X15 (BALLOONS) IMPLANT
BALLOON ~~LOC~~ TREK NEO RX 3.5X15 (BALLOONS) IMPLANT
CANISTER PENUMBRA ENGINE (MISCELLANEOUS) IMPLANT
CATH 5FR JL3.5 JR4 ANG PIG MP (CATHETERS) IMPLANT
CATH INDIGO CAT RX KIT (CATHETERS) IMPLANT
CATH VISTA GUIDE 6FR JR4 (CATHETERS) IMPLANT
DEVICE RAD TR BAND REGULAR (VASCULAR PRODUCTS) IMPLANT
DRAPE BRACHIAL (DRAPES) IMPLANT
GLIDESHEATH SLEND SS 6F .021 (SHEATH) IMPLANT
GUIDEWIRE INQWIRE 1.5J.035X260 (WIRE) IMPLANT
INQWIRE 1.5J .035X260CM (WIRE) ×1
KIT ENCORE 26 ADVANTAGE (KITS) IMPLANT
PACK CARDIAC CATH (CUSTOM PROCEDURE TRAY) ×1 IMPLANT
SET ATX SIMPLICITY (MISCELLANEOUS) IMPLANT
STENT ONYX FRONTIER 3.0X26 (Permanent Stent) IMPLANT
WIRE HITORQ VERSACORE ST 145CM (WIRE) IMPLANT
WIRE RUNTHROUGH .014X180CM (WIRE) IMPLANT

## 2022-03-14 NOTE — Progress Notes (Signed)
*  PRELIMINARY RESULTS* Echocardiogram 2D Echocardiogram has been performed.  Sherrie Sport 03/14/2022, 10:10 AM

## 2022-03-14 NOTE — Consult Note (Signed)
Cardiology Consultation:   Patient ID: Lisa Davila; [id number]; 09/30/1975   Admit date: 03/13/2022 Date of Consult: 03/14/2022  Primary Care Provider: Tracie Harrier, MD Primary Cardiologist: Fletcher Anon Primary Electrophysiologist:  None   Patient Profile:   Lisa Davila is a 46 y.o. female with a hx of CAD with inferior STEMI in 06/2021 s/p PCI/DES to the RCA, HFrEF secondary to ICM, SVT s/p EP study without inducible arrhythmia, HTN, HLD, fibromyalgia, anxiety, and ongoing tobacco abuse who is being seen today for the evaluation of NSTEMI at the request of Dr. Charleen Kirks.  History of Present Illness:   Lisa Davila was admitted to the hospital in 06/2021 with an inferior STEMI. Emergent LHC showed thrombotic occlusion of the proximal RCA which was treated with PCI/DES. There was mild nonobstructive CAD affecting the left coronary tree. Echo showed an EF of 50-55%. Brilinta was later transitioned to Plavix due to persistent dyspnea. She tested positive for Covid in 09/2021. Following this, she had recurrent chest pain with multiple ED visits. Repeat echo in 08/2021 Lisa Davila) showed an EF of 35%. Due to her cardiomyopathy, she underwent R/LHC in 11/2021 which showed mild in-stent restenosis and no evidence of obstructive disease of the left coronary arteries. EF was 35%. RHC showed mildly to moderately elevated filling pressures, minimal pulmonary hypertension and normal cardiac output. GDMT was escalated. Due to persistent palpitations, she underwent Zio monitoring which showed no evidence of arrhythmia with rare PACs and PVCs. She has continued to note SOB and abdominal bloating. She continues to smoke cigarettes.   She reported not feeling well on 11/6 with palpitations throughout the day. Due to symptoms, she laid down for a nap. She was woken up from her nap with persistent palpitations. She was without chest pain or dyspnea at that time. She took 4 baby aspirin at home. Due to  palpitations feeling similar to what she experienced at the time of her STEMI, she contacted EMS. While en route to the hospital with EMS, she developed chest pain, dyspnea, and diaphoresis. In the ED, BP has been soft at times into the 82U systolic. Currently stable in the mid 23N mmHg systolic in the setting of nitro gtt which has been stopped. EKG showed NSR, 85 bpm, prior inferior infarct, nonspecific st/t changes. HS-Tn 936 with a delta troponin of 2036, currently trended to 5742. CXR without acute cardiopulmonary process. Drug screen pending. Potassium 2.7 repleted to 4.2 currently. She denies missing any doses of DAPT. Currently without chest pain.     Past Medical History:  Diagnosis Date   Acute ethmoidal sinusitis    Acute ethmoidal sinusitis    Acute maxillary sinusitis    Acute upper respiratory infections of unspecified site    Anxiety state, unspecified    Chest pain, unspecified    CHF (congestive heart failure) ([place])    Coronary artery disease    Depression    Diarrhea    Esophageal reflux    Generalized anxiety disorder    Hyperlipidemia    Hypertension    Hypoglycemia, unspecified    Lumbago    Nausea with vomiting    Obstructive chronic bronchitis with exacerbation (HCC)    Other and unspecified hyperlipidemia    Other and unspecified peripheral vertigo(386.19)    Other bursitis disorders    Other malaise and fatigue    Other specified diseases due to viruses    Other vitamin B12 deficiency anemia    Pain in limb    Pernicious anemia  Sleep related hypoventilation/hypoxemia in conditions classifiable elsewhere    SVT (supraventricular tachycardia)    Swelling, mass, or lump in head and neck    Tobacco use disorder    Unspecified disorder of external ear    Unspecified disorder of skin and subcutaneous tissue    Unspecified otitis media     Past Surgical History:  Procedure Laterality Date   CESAREAN SECTION     CORONARY/GRAFT ACUTE MI REVASCULARIZATION  N/A 07/04/2021   Procedure: Coronary/Graft Acute MI Revascularization;  Surgeon: Wellington Hampshire, MD;  Location: [facility];  Service: Cardiovascular;  Laterality: N/A;   LEFT HEART CATH AND CORONARY ANGIOGRAPHY N/A 07/04/2021   Procedure: LEFT HEART CATH AND CORONARY ANGIOGRAPHY;  Surgeon: Wellington Hampshire, MD;  Location: [facility];  Service: Cardiovascular;  Laterality: N/A;   RIGHT/LEFT HEART CATH AND CORONARY ANGIOGRAPHY N/A 11/07/2021   Procedure: RIGHT/LEFT HEART CATH AND CORONARY ANGIOGRAPHY;  Surgeon: Wellington Hampshire, MD;  Location: [facility];  Service: Cardiovascular;  Laterality: N/A;     Home Meds: Prior to Admission medications   Medication Sig Start Date End Date Taking? Authorizing Provider  aspirin 81 MG chewable tablet Chew 1 tablet (81 mg total) by mouth daily. 07/06/21  Yes Sreenath, Sudheer B, MD  atorvastatin (LIPITOR) 80 MG tablet Take 1 tablet (80 mg total) by mouth once daily 08/30/21  Yes   clopidogrel (PLAVIX) 75 MG tablet Take 1 tablet (75 mg total) by mouth daily. 01/10/22  Yes Wellington Hampshire, MD  cyanocobalamin (VITAMIN B12) 1000 MCG tablet Take 1 tablet (1,000 mcg total) by mouth once daily 01/27/22  Yes   dapagliflozin propanediol (FARXIGA) 10 MG TABS tablet Take 1 tablet (10 mg total) by mouth daily before breakfast. 12/22/21  Yes Hammock, Sheri, NP  DULoxetine (CYMBALTA) 30 MG capsule Take 1 capsule (30 mg total) by mouth once daily 12/06/21  Yes   eplerenone (INSPRA) 25 MG tablet Take 1 tablet (25 mg total) by mouth daily. 11/15/21  Yes Wellington Hampshire, MD  ergocalciferol (VITAMIN D2) 1.25 MG (50000 UT) capsule Take one capsule by mouth once a week 09/12/21  Yes   furosemide (LASIX) 40 MG tablet Take 1 tablet (40 mg total) by mouth daily. 01/10/22  Yes Wellington Hampshire, MD  ipratropium-albuterol (DUONEB) 0.5-2.5 (3) MG/3ML SOLN Take 3 mls by nebulization 4 (four) times daily for 30 days 07/29/21  Yes Fields, Glenda L, NP   medroxyPROGESTERone (DEPO-PROVERA) 150 MG/ML injection Inject into the muscle. 03/06/22  Yes [provider]  metoprolol succinate (TOPROL-XL) 50 MG 24 hr tablet Take 1 tablet (50 mg total) by mouth daily. 11/07/21  Yes Wellington Hampshire, MD  potassium chloride (KLOR-CON) 10 MEQ tablet Take 4 tablets (40 mEq total) by mouth daily. 01/18/22  Yes Hackney, Otila Kluver A, FNP  spironolactone (ALDACTONE) 25 MG tablet Take 25 mg by mouth daily. 09/12/21 09/12/22 Yes [provider]  topiramate (TOPAMAX) 25 MG tablet Take 25 mg by mouth daily. 07/26/21 07/26/22 Yes [provider]  albuterol (VENTOLIN HFA) 108 (90 Base) MCG/ACT inhaler Inhale 2 puffs into the lungs every 6 hours as needed for wheezing. 12/13/21     ALPRAZolam (XANAX) 0.5 MG tablet Take 1 tablet (0.5 mg total) by mouth 3 (three) times daily as needed for Sleep 01/27/22     nicotine (NICODERM CQ - DOSED IN MG/24 HOURS) 14 mg/24hr patch Place 1 patch onto the skin daily for 14 days Patient not taking: Reported  on 03/13/2022 10/04/21     nitroGLYCERIN (NITROSTAT) 0.4 MG SL tablet Place 1 tablet (0.4 mg total) under the tongue every 5 (five) minutes as needed for chest pain. Patient not taking: Reported on 03/13/2022 07/05/21   Sidney Ace, MD    Inpatient Medications: Scheduled Meds:  aspirin EC  81 mg Oral Daily   clopidogrel  75 mg Oral Q breakfast   diphenhydrAMINE  50 mg Oral Once   Or   diphenhydrAMINE  50 mg Intravenous Once   DULoxetine  30 mg Oral Daily   fentaNYL (SUBLIMAZE) injection  50 mcg Intravenous Once   methylPREDNISolone (SOLU-MEDROL) injection  40 mg Intravenous Q4H   metoprolol tartrate  2.5 mg Intravenous Once   nicotine  21 mg Transdermal Daily   rosuvastatin  20 mg Oral Daily   sodium chloride flush  3 mL Intravenous Q12H   Continuous Infusions:  sodium chloride     [START ON 03/15/2022] sodium chloride     Followed by   Derrill Memo ON 03/15/2022] sodium chloride     heparin 1,250 Units/hr  (03/14/22 0414)   nitroGLYCERIN Stopped (03/13/22 2210)   PRN Meds: sodium chloride, acetaminophen, albuterol, ALPRAZolam, ondansetron (ZOFRAN) IV, sodium chloride flush  Allergies:   Allergies  Allergen Reactions   Buspirone     Other reaction(s): Other (See Comments), Other (See Comments) Gi UPSET Gi UPSET Gi UPSET Other reaction(s): Other (See Comments) Gi UPSET Gi UPSET    Citalopram Nausea Only   Metrizamide Hives   Sulfa Antibiotics Hives   Contrast Media [Iodinated Contrast Media] Hives   Sulfa Drugs Cross Reactors    Sulfasalazine Other (See Comments)    Social History:   Social History   Socioeconomic History   Marital status: Single    Spouse name: Not on file   Number of children: Not on file   Years of education: Not on file   Highest education level: Not on file  Occupational History   Not on file  Tobacco Use   Smoking status: Every Day    Years: 30.00    Types: Cigarettes   Smokeless tobacco: Never   Tobacco comments:    Has been smoking for 30 years Wants to quit after she gets past all the heart stuff and changes in life.  Smoking 3 a day right now  Vaping Use   Vaping Use: Never used  Substance and Sexual Activity   Alcohol use: No   Drug use: Never   Sexual activity: Not Currently  Other Topics Concern   Not on file  Social History Narrative   Lives with fiance, Juluis Rainier.   Social Determinants of Health   Financial Resource Strain: Not on file  Food Insecurity: Not on file  Transportation Needs: Not on file  Physical Activity: Not on file  Stress: Not on file  Social Connections: Not on file  Intimate Partner Violence: Not on file     Family History:   Family History  Problem Relation Age of Onset   Depression Mother    Cancer Father        Father   Hypertension Sister    Breast cancer Maternal Grandmother    Cancer Paternal Grandfather        Breast     ROS:  Review of Systems  Constitutional:  Positive for  malaise/fatigue. Negative for chills, diaphoresis, fever and weight loss.  HENT:  Negative for congestion.   Eyes:  Negative for discharge and redness.  Respiratory:  Negative for cough, sputum production, shortness of breath and wheezing.   Cardiovascular:  Positive for chest pain and palpitations. Negative for orthopnea, claudication, leg swelling and PND.  Gastrointestinal:  Negative for abdominal pain, heartburn, nausea and vomiting.  Musculoskeletal:  Negative for falls and myalgias.  Skin:  Negative for rash.  Neurological:  Positive for weakness. Negative for dizziness, tingling, tremors, sensory change, speech change, focal weakness and loss of consciousness.  Endo/Heme/Allergies:  Does not bruise/bleed easily.  Psychiatric/Behavioral:  Negative for substance abuse. The patient is not nervous/anxious.   All other systems reviewed and are negative.     Physical Exam/Data:   Vitals:   03/14/22 0545 03/14/22 0600 03/14/22 0615 03/14/22 0645  BP: (!) 82/54 (!) 86/55 (!) 87/56 (!) 88/46  Pulse: 67 72 73 72  Resp: 19 (!) '23 20 20  '$ Temp:      TempSrc:      SpO2:      Weight:      Height:       No intake or output data in the 24 hours ending 03/14/22 0759 Filed Weights   03/13/22 1800  Weight: 84.4 kg   Body mass index is 35.14 kg/m.   Physical Exam: General: Well developed, well nourished, in no acute distress. Head: Normocephalic, atraumatic, sclera non-icteric, no xanthomas, nares without discharge.  Neck: Negative for carotid bruits. JVD not elevated. Lungs: Clear bilaterally to auscultation without wheezes, rales, or rhonchi. Breathing is unlabored. Heart: RRR with S1 S2. No murmurs, rubs, or gallops appreciated. Abdomen: Soft, non-tender, non-distended with normoactive bowel sounds. No hepatomegaly. No rebound/guarding. No obvious abdominal masses. Msk:  Strength and tone appear normal for age. Extremities: No clubbing or cyanosis. No edema. Distal pedal pulses are 2+  and equal bilaterally. Neuro: Alert and oriented X 3. No facial asymmetry. No focal deficit. Moves all extremities spontaneously. Psych:  Responds to questions appropriately with a normal affect.   EKG:  The EKG was personally reviewed and demonstrates: NSR, 85 bpm, prior inferior infarct, nonspecific st/t changes Telemetry:  Telemetry was personally reviewed and demonstrates: SR, rare PVCs, artifact  Weights: Filed Weights   03/13/22 1800  Weight: 84.4 kg    Relevant CV Studies:  [facility] 11/07/2021:   Mid LAD lesion is 30% stenosed.   Prox RCA lesion is 20% stenosed.   2nd Diag lesion is 50% stenosed.   There is moderate left ventricular systolic dysfunction.   LV end diastolic pressure is mildly elevated.   1.  Patent proximal RCA stent with mild in-stent restenosis.  No obstructive disease affecting the left coronary artery system. 2.  Moderately reduced LV systolic function with an EF of 35% with global hypokinesis that is more prominent in the inferior apical area. 3.  Right heart catheterization showed mildly to moderately elevated filling pressures, minimal pulmonary hypertension and normal cardiac output.   Recommendations: Continue medical therapy for coronary artery disease. Optimize heart failure treatment.  The patient is volume overloaded.  I increased Lasix to 40 mg once daily.  In addition, due to resting sinus tachycardia, increased Toprol to 50 mg once daily. We will plan on adding an ARB and SGLT2 inhibitor upon follow-up. __________  2D echo Lisa Davila) 08/29/2021: INTERPRETATION  MODERATE LV SYSTOLIC DYSFUNCTION (See above)  NORMAL RIGHT VENTRICULAR SYSTOLIC FUNCTION  TRIVIAL REGURGITATION NOTED (See above)  NO VALVULAR STENOSIS  TRIVIAL MR, TR  EF 35%   Laboratory Data:  Chemistry Recent Labs  Lab 03/13/22 1719 03/14/22 0148 03/14/22 0619  NA  141 138 138  K 2.7* 4.3 4.2  CL 119* 113* 115*  CO2 17* 20* 20*  GLUCOSE 94 106* 98  BUN '6 6 6   '$ CREATININE 0.65 0.75 0.76  CALCIUM 6.7* 8.5* 8.7*  GFRNONAA >60 >60 >60  ANIONGAP 5 5 3*    No results for input(s): "PROT", "ALBUMIN", "AST", "ALT", "ALKPHOS", "BILITOT" in the last 168 hours. Hematology Recent Labs  Lab 03/13/22 1849 03/14/22 0619  WBC 15.9* 8.9  RBC 4.45 3.79*  HGB 13.7 11.9*  HCT 40.2 34.7*  MCV 90.3 91.6  MCH 30.8 31.4  MCHC 34.1 34.3  RDW 12.5 12.7  PLT 309 264   Cardiac EnzymesNo results for input(s): "TROPONINI" in the last 168 hours. No results for input(s): "TROPIPOC" in the last 168 hours.  BNP Recent Labs  Lab 03/13/22 1849  BNP 92.8    DDimer No results for input(s): "DDIMER" in the last 168 hours.  Radiology/Studies:  DG Chest 2 View  Result Date: 03/13/2022 IMPRESSION: No active cardiopulmonary disease. Electronically Signed   By: Donavan Foil M.D.   On: 03/13/2022 17:40   US ARTERIAL LOWER EXTREMITY DUPLEX RIGHT(NON-ABI)  Result Date: 03/10/2022 IMPRESSION: Mild scattered atherosclerotic plaque without evidence of significant stenosis or occlusion. Signed, Criselda Peaches, MD, [facility] Electronically Signed   By: Jacqulynn Cadet M.D.   On: 03/10/2022 13:43    Assessment and Plan:   1. CAD involving the native coronary arteries with NSTEMI: -Currently chest pain free -HS-Tn has trended to 5742, cycle to peak -ASA/Plavix -Heparin gtt -NPO -Plan for LHC today -Echo pending -UDS pending -Aggressive risk factor modification   2. HFrEF secondary to ICM: -Appears euvolemic -Echo pending -Soft BP precludes GDMT at this time, escalate as able throughout admission and in follow up -BNP normal  3. Palpitations/PSVT: -Recent Zio patch in 11/2021 showed no evidence of arrhythmia -Monitor on tele -May need to repeat Zio patch if no significant arrhythmias are noted on tele -Hypokalemia repleted -Magnesium at goal -Recent TSH normal  4. HLD: -LDL  137 -Question if there is some nonadherence to Lipitor -Goal LDL ideally < 55 -Started on Crestor at admission -Follow up lipid panel in 2 months, if LDL remains above goal then would escalate therapy   5. Tobacco use: -Nicotine patch  -Complete cessation has been recommended to the patient  6. Hypokalemia: -Repleted    Shared Decision Making/Informed Consent{  The risks [stroke (1 in 1000), death (1 in 1000), kidney failure [usually temporary] (1 in 500), bleeding (1 in 200), allergic reaction [possibly serious] (1 in 200)], benefits (diagnostic support and management of coronary artery disease) and alternatives of a cardiac catheterization were discussed in detail with Lisa Davila and she is willing to proceed.    For questions or updates, please contact [place] Please consult [email or web address] for contact info under Cardiology/STEMI.   Signed, Christell Faith, PA-C [facility] Pager: [phone] 03/14/2022, 7:59 AM

## 2022-03-14 NOTE — ED Notes (Signed)
Patient is sleeping.  No s/sx of distress.   Bp is noted to be lower.  She did receive xanax for her anxiety.  Dr. Charleen Kirks made aware of the current vital signs.

## 2022-03-14 NOTE — ED Notes (Signed)
Communication with provider regarding onging low bp.  Patient denies any shortness of breath and she denies chest pain.  She has requested an inhaler for use at night due to chronic cough.  She is alert and oriented.  Skin is warm and dry.  Cap refill is 3 seconds.  Family remains at bedside.  She is afebrile as well.

## 2022-03-14 NOTE — Progress Notes (Signed)
PROGRESS NOTE    Lisa Davila  VVO:[id number] DOB: 07-Oct-1975 DOA: 03/13/2022 PCP: Tracie Harrier, MD    Brief Narrative:  46 y.o. female with medical history significant of CAD c/b STEMI s/p DES RCA (February 2023), HFpEF, SVT, hyperlipidemia, generalized anxiety, fibromyalgia, who presents to the ED with complaints of palpitations, diaphoresis and chest pain.   Lisa Davila states she was in her normal state of health yesterday.  Today, she began to experience intermittent palpitations with associated shortness of breath.  Due to this, she went to take a nap and suddenly awoke at 4 PM on 11/6 with significant diaphoresis.  Due to concern this was an impending heart attack, she contacted EMS.  Once EMS arrived, she began to experience substernal chest pain that did not radiate.  Since arriving to the ED, she notes that her chest pain has improved with initiation of nitroglycerin.  She has not experienced any further episodes of palpitations.   Lisa Davila states she has been taking her medications as prescribed and has not missed any recent doses.  She continues to smoke approximately 1 pack/day.  11/7: LHC planned today  Assessment & Plan:   Principal Problem:   NSTEMI (non-ST elevated myocardial infarction) ([place]) Active Problems:   Hypotension   SVT (supraventricular tachycardia)   Coronary artery disease   Leukocytosis   Chronic systolic heart failure (HCC)   Generalized anxiety disorder   Hypertension   Tobacco user   Hyperlipidemia   NSTEMI (non-ST elevated myocardial infarction) [facility]) Patient presenting with 1 day history of intermittent palpitations, sudden onset diaphoresis and subsequent up with set of chest pain.  Troponins are markedly elevated with no evidence of ST elevation on EKG consistent with NSTEMI.  Patient is at high risk for  obstructive CAD given recent history of STEMI with RCA DES, continued daily tobacco use, uncontrolled hyperlipidemia.  Cardiology  has been consulted; discussed with Dr. Fletcher Anon and Dr. Haroldine Laws.  Recommending IV nitroglycerin and metoprolol as blood pressure tolerates, heparin infusion and plan for left heart cath tomorrow morning (11/7 AM).   Plan: IV heparin gtt LHC today  Hypotension Patient initially normotensive, however developed subsequent hypotension in the setting of IV nitroglycerin.  She is asymptomatic at this time  Plan: Nitro on hold due to hypotension Chest pain free Telemetry  SVT (supraventricular tachycardia) Patient has a history of prior SVTs with reported intermittent palpitations today. Plan: Telemetry   Coronary artery disease History of CAD C/B STEMI s/p DES to RCA.  Most recent heart cath in July 2023 demonstrated proximal RCA stent with mild in-stent restenosis, moderately reduced LV function with EF of 35% with global hypokinesis and minimal pulmonary hypertension. Plan: Hep gtt LHC Further management post cath   Leukocytosis Likely reactive in the setting of NSTEMI, however WBC has been elevated on the last 3 checks.  Recommend outpatient follow-up with PCP to ensure resolution.   Chronic systolic heart failure (HCC) Echocardiogram obtained in February 2023 demonstrated EF of 50-55% with no regional wall motion abnormalities, however left and right heart cath in July 2023 demonstrated EF of 35% with global hypokinesis, more prominent in the inferior apical area.  GDMT includes Farxiga, eplerenone, metoprolol.  Patient currently not on an ARB.  Patient appears euvolemic at this time.  Plan: GDMT on hold due to hypotension   Generalized anxiety disorder home Xanax at decreased dose due to hypotension   Hypertension Holding home antihypertensives due to hypotension   Tobacco user Discussed importance of smoking  cessation to prevent future cardiac complications.  Patient expressed understanding.   Hyperlipidemia Patient has a long-term history of hyperlipidemia dating back to  2013.  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  Currently on atorvastatin 80 mg daily for the past 5 months with LDL still elevated at 156.  I suspect patient may benefit from the addition of Zetia, and potentially even a PCSK9 inhibitor, however that is financially prohibitive at this time the setting of health insurance loss.  Plan: - Per patient preference, we will switch from high intensity atorvastatin to high intensity rosuvastatin - Consider addition of Zetia prior to discharge    DVT prophylaxis: IV heparin Code Status: FULL Family Communication:Family member at bedside 11/7 Disposition Plan: Status is: Inpatient Remains inpatient appropriate because: NSTEMI on heparin GTT   Level of care: Progressive  Consultants:  Cardiology- [facility]  Procedures:  LHC- 11/7  Antimicrobials: None   Subjective: Seen and examined. Stable.  CP free  Objective: Vitals:   03/14/22 1035 03/14/22 1104 03/14/22 1111 03/14/22 1146  BP:  98/75  (!) 93/54  Pulse: 84 80  79  Resp: '17 17  17  '$ Temp:   98.4 F (36.9 C) 98.8 F (37.1 C)  TempSrc:   Oral Oral  SpO2:   98% 96%  Weight:    84.4 kg  Height:    '5\' 1"'$  (1.549 m)   No intake or output data in the 24 hours ending 03/14/22 1440 Filed Weights   03/13/22 1800 03/14/22 1146  Weight: 84.4 kg 84.4 kg    Examination:  General exam: Appears calm and comfortable  Respiratory system: Clear to auscultation. Respiratory effort normal. Cardiovascular system: S1S2, RRR, no murmur Gastrointestinal system: Soft NTND +BS Central nervous system: Alert and oriented. No focal neurological deficits. Extremities: Symmetric 5 x 5 power. Skin: No rashes, lesions or ulcers Psychiatry: Judgement and insight appear normal. Mood & affect appropriate.     Data Reviewed: I have personally reviewed following labs and imaging studies  CBC: Recent Labs  Lab 03/13/22 1849 03/14/22 0619  WBC 15.9* 8.9  HGB 13.7 11.9*  HCT 40.2  34.7*  MCV 90.3 91.6  PLT 309 623   Basic Metabolic Panel: Recent Labs  Lab 03/13/22 1719 03/13/22 1849 03/14/22 0148 03/14/22 0619  NA 141  --  138 138  K 2.7*  --  4.3 4.2  CL 119*  --  113* 115*  CO2 17*  --  20* 20*  GLUCOSE 94  --  106* 98  BUN 6  --  6 6  CREATININE 0.65  --  0.75 0.76  CALCIUM 6.7*  --  8.5* 8.7*  MG  --  2.0  --   --    GFR: Estimated Creatinine Clearance: 86.6 mL/min (by C-G formula based on SCr of 0.76 mg/dL). Liver Function Tests: No results for input(s): "AST", "ALT", "ALKPHOS", "BILITOT", "PROT", "ALBUMIN" in the last 168 hours. No results for input(s): "LIPASE", "AMYLASE" in the last 168 hours. No results for input(s): "AMMONIA" in the last 168 hours. Coagulation Profile: Recent Labs  Lab 03/13/22 1849  INR 0.9   Cardiac Enzymes: No results for input(s): "CKTOTAL", "CKMB", "CKMBINDEX", "TROPONINI" in the last 168 hours. BNP (last 3 results) No results for input(s): "PROBNP" in the last 8760 hours. HbA1C: No results for input(s): "HGBA1C" in the last 72 hours. CBG: No results for input(s): "GLUCAP" in the last 168 hours. Lipid Profile: Recent Labs    03/14/22 0148  CHOL 201*  HDL 28*  LDLCALC 137*  TRIG 180*  CHOLHDL 7.2   Thyroid Function Tests: No results for input(s): "TSH", "T4TOTAL", "FREET4", "T3FREE", "THYROIDAB" in the last 72 hours. Anemia Panel: No results for input(s): "VITAMINB12", "FOLATE", "FERRITIN", "TIBC", "IRON", "RETICCTPCT" in the last 72 hours. Sepsis Labs: No results for input(s): "PROCALCITON", "LATICACIDVEN" in the last 168 hours.  No results found for this or any previous visit (from the past 240 hour(s)).       Radiology Studies: DG Chest 2 View  Result Date: 03/13/2022 CLINICAL DATA:  Chest pain EXAM: CHEST - 2 VIEW COMPARISON:  10/25/2021 FINDINGS: The heart size and mediastinal contours are within normal limits. Both lungs are clear. The visualized skeletal structures are unremarkable.  IMPRESSION: No active cardiopulmonary disease. Electronically Signed   By: Donavan Foil M.D.   On: 03/13/2022 17:40        Scheduled Meds:  [MAR Hold] aspirin EC  81 mg Oral Daily   [MAR Hold] clopidogrel  75 mg Oral Q breakfast   [MAR Hold] DULoxetine  30 mg Oral Daily   [MAR Hold] fentaNYL (SUBLIMAZE) injection  50 mcg Intravenous Once   methylPREDNISolone sodium succinate       methylPREDNISolone (SOLU-MEDROL) injection  40 mg Intravenous Q4H   [MAR Hold] metoprolol tartrate  2.5 mg Intravenous Once   [MAR Hold] nicotine  21 mg Transdermal Daily   [MAR Hold] rosuvastatin  20 mg Oral Daily   sodium chloride flush  3 mL Intravenous Q12H   sterile water (preservative free)       Continuous Infusions:  sodium chloride     [START ON 03/15/2022] sodium chloride 3 mL/kg/hr (03/14/22 1205)   Followed by   Derrill Memo ON 03/15/2022] sodium chloride     heparin Stopped (03/14/22 1205)   nitroGLYCERIN Stopped (03/13/22 2210)   tirofiban 0.15 mcg/kg/min (03/14/22 1411)     LOS: 1 day     Sidney Ace, MD [facility]   If 7PM-7AM, please contact night-coverage  03/14/2022, 2:40 PM

## 2022-03-14 NOTE — Assessment & Plan Note (Addendum)
Patient presenting with 1 day history of intermittent palpitations, sudden onset diaphoresis and subsequent up with set of chest pain.  Troponins are markedly elevated with no evidence of ST elevation on EKG consistent with NSTEMI.  Patient is at high risk for  obstructive CAD given recent history of STEMI with RCA DES, continued daily tobacco use, uncontrolled hyperlipidemia.  Cardiology has been consulted; discussed with Dr. Fletcher Anon and Dr. Haroldine Laws.  Recommending IV nitroglycerin and metoprolol as blood pressure tolerates, heparin infusion and plan for left heart cath tomorrow morning (11/7 AM).   - Heparin infusion per pharmacy protocol - Continue IV nitroglycerin as blood pressure tolerates.  Discontinue for MAP less than 65 - S/p one-time dose of IV metoprolol - Continue home aspirin and Plavix - Repeat EKG for any reoccurrence of chest pain - N.p.o. at midnight

## 2022-03-14 NOTE — Assessment & Plan Note (Signed)
   Holding home antihypertensives due to hypotension 

## 2022-03-14 NOTE — Assessment & Plan Note (Signed)
Patient has a history of prior SVTs with reported intermittent palpitations today.  - Telemetry monitoring

## 2022-03-14 NOTE — Assessment & Plan Note (Signed)
Likely reactive in the setting of NSTEMI, however WBC has been elevated on the last 3 checks.  Recommend outpatient follow-up with PCP to ensure resolution.

## 2022-03-14 NOTE — Assessment & Plan Note (Signed)
Patient initially normotensive, however developed subsequent hypotension in the setting of IV nitroglycerin.  She is asymptomatic at this time  - Continue IV nitroglycerin as blood pressure tolerates.  Hold for MAP < 65 - Hold home antihypertensives

## 2022-03-14 NOTE — Assessment & Plan Note (Addendum)
Echocardiogram obtained in February 2023 demonstrated EF of 50-55% with no regional wall motion abnormalities, however left and right heart cath in July 2023 demonstrated EF of 35% with global hypokinesis, more prominent in the inferior apical area.  GDMT includes Farxiga, eplerenone, metoprolol.  Patient currently not on an ARB.  Patient appears euvolemic at this time.  - Holding home GDMT in the setting of hypotension

## 2022-03-14 NOTE — Assessment & Plan Note (Signed)
-   Discussed importance of smoking cessation to prevent future cardiac complications.  Patient expressed understanding.

## 2022-03-14 NOTE — Assessment & Plan Note (Signed)
-   Restart home Xanax at decreased dose due to hypotension

## 2022-03-14 NOTE — H&P (View-Only) (Signed)
Cardiology Consultation:   Patient ID: Lisa Davila; [id number]; 1976-04-07   Admit date: 03/13/2022 Date of Consult: 03/14/2022  Primary Care Provider: Tracie Harrier, MD Primary Cardiologist: Fletcher Anon Primary Electrophysiologist:  None   Patient Profile:   Lisa Davila is a 46 y.o. female with a hx of CAD with inferior STEMI in 06/2021 s/p PCI/DES to the RCA, HFrEF secondary to ICM, SVT s/p EP study without inducible arrhythmia, HTN, HLD, fibromyalgia, anxiety, and ongoing tobacco abuse who is being seen today for the evaluation of NSTEMI at the request of Dr. Charleen Kirks.  History of Present Illness:   Lisa Davila was admitted to the hospital in 06/2021 with an inferior STEMI. Emergent LHC showed thrombotic occlusion of the proximal RCA which was treated with PCI/DES. There was mild nonobstructive CAD affecting the left coronary tree. Echo showed an EF of 50-55%. Brilinta was later transitioned to Plavix due to persistent dyspnea. She tested positive for Covid in 09/2021. Following this, she had recurrent chest pain with multiple ED visits. Repeat echo in 08/2021 Lisa Davila) showed an EF of 35%. Due to her cardiomyopathy, she underwent R/LHC in 11/2021 which showed mild in-stent restenosis and no evidence of obstructive disease of the left coronary arteries. EF was 35%. RHC showed mildly to moderately elevated filling pressures, minimal pulmonary hypertension and normal cardiac output. GDMT was escalated. Due to persistent palpitations, she underwent Zio monitoring which showed no evidence of arrhythmia with rare PACs and PVCs. She has continued to note SOB and abdominal bloating. She continues to smoke cigarettes.   She reported not feeling well on 11/6 with palpitations throughout the day. Due to symptoms, she laid down for a nap. She was woken up from her nap with persistent palpitations. She was without chest pain or dyspnea at that time. She took 4 baby aspirin at home. Due to  palpitations feeling similar to what she experienced at the time of her STEMI, she contacted EMS. While en route to the hospital with EMS, she developed chest pain, dyspnea, and diaphoresis. In the ED, BP has been soft at times into the 09N systolic. Currently stable in the mid 23F mmHg systolic in the setting of nitro gtt which has been stopped. EKG showed NSR, 85 bpm, prior inferior infarct, nonspecific st/t changes. HS-Tn 936 with a delta troponin of 2036, currently trended to 5742. CXR without acute cardiopulmonary process. Drug screen pending. Potassium 2.7 repleted to 4.2 currently. She denies missing any doses of DAPT. Currently without chest pain.     Past Medical History:  Diagnosis Date   Acute ethmoidal sinusitis    Acute ethmoidal sinusitis    Acute maxillary sinusitis    Acute upper respiratory infections of unspecified site    Anxiety state, unspecified    Chest pain, unspecified    CHF (congestive heart failure) ([place])    Coronary artery disease    Depression    Diarrhea    Esophageal reflux    Generalized anxiety disorder    Hyperlipidemia    Hypertension    Hypoglycemia, unspecified    Lumbago    Nausea with vomiting    Obstructive chronic bronchitis with exacerbation (HCC)    Other and unspecified hyperlipidemia    Other and unspecified peripheral vertigo(386.19)    Other bursitis disorders    Other malaise and fatigue    Other specified diseases due to viruses    Other vitamin B12 deficiency anemia    Pain in limb    Pernicious anemia  Sleep related hypoventilation/hypoxemia in conditions classifiable elsewhere    SVT (supraventricular tachycardia)    Swelling, mass, or lump in head and neck    Tobacco use disorder    Unspecified disorder of external ear    Unspecified disorder of skin and subcutaneous tissue    Unspecified otitis media     Past Surgical History:  Procedure Laterality Date   CESAREAN SECTION     CORONARY/GRAFT ACUTE MI REVASCULARIZATION  N/A 07/04/2021   Procedure: Coronary/Graft Acute MI Revascularization;  Surgeon: Wellington Hampshire, MD;  Location: [place] [facility];  Service: Cardiovascular;  Laterality: N/A;   LEFT HEART CATH AND CORONARY ANGIOGRAPHY N/A 07/04/2021   Procedure: LEFT HEART CATH AND CORONARY ANGIOGRAPHY;  Surgeon: Wellington Hampshire, MD;  Location: [place] [facility];  Service: Cardiovascular;  Laterality: N/A;   RIGHT/LEFT HEART CATH AND CORONARY ANGIOGRAPHY N/A 11/07/2021   Procedure: RIGHT/LEFT HEART CATH AND CORONARY ANGIOGRAPHY;  Surgeon: Wellington Hampshire, MD;  Location: [place] [facility];  Service: Cardiovascular;  Laterality: N/A;     Home Meds: Prior to Admission medications   Medication Sig Start Date End Date Taking? Authorizing Provider  aspirin 81 MG chewable tablet Chew 1 tablet (81 mg total) by mouth daily. 07/06/21  Yes Sreenath, Sudheer B, MD  atorvastatin (LIPITOR) 80 MG tablet Take 1 tablet (80 mg total) by mouth once daily 08/30/21  Yes   clopidogrel (PLAVIX) 75 MG tablet Take 1 tablet (75 mg total) by mouth daily. 01/10/22  Yes Wellington Hampshire, MD  cyanocobalamin (VITAMIN B12) 1000 MCG tablet Take 1 tablet (1,000 mcg total) by mouth once daily 01/27/22  Yes   dapagliflozin propanediol (FARXIGA) 10 MG TABS tablet Take 1 tablet (10 mg total) by mouth daily before breakfast. 12/22/21  Yes Hammock, Sheri, NP  DULoxetine (CYMBALTA) 30 MG capsule Take 1 capsule (30 mg total) by mouth once daily 12/06/21  Yes   eplerenone (INSPRA) 25 MG tablet Take 1 tablet (25 mg total) by mouth daily. 11/15/21  Yes Wellington Hampshire, MD  ergocalciferol (VITAMIN D2) 1.25 MG (50000 UT) capsule Take one capsule by mouth once a week 09/12/21  Yes   furosemide (LASIX) 40 MG tablet Take 1 tablet (40 mg total) by mouth daily. 01/10/22  Yes Wellington Hampshire, MD  ipratropium-albuterol (DUONEB) 0.5-2.5 (3) MG/3ML SOLN Take 3 mls by nebulization 4 (four) times daily for 30 days 07/29/21  Yes Fields, Glenda L, NP   medroxyPROGESTERone (DEPO-PROVERA) 150 MG/ML injection Inject into the muscle. 03/06/22  Yes [provider]  metoprolol succinate (TOPROL-XL) 50 MG 24 hr tablet Take 1 tablet (50 mg total) by mouth daily. 11/07/21  Yes Wellington Hampshire, MD  potassium chloride (KLOR-CON) 10 MEQ tablet Take 4 tablets (40 mEq total) by mouth daily. 01/18/22  Yes Hackney, Otila Kluver A, FNP  spironolactone (ALDACTONE) 25 MG tablet Take 25 mg by mouth daily. 09/12/21 09/12/22 Yes [provider]  topiramate (TOPAMAX) 25 MG tablet Take 25 mg by mouth daily. 07/26/21 07/26/22 Yes [provider]  albuterol (VENTOLIN HFA) 108 (90 Base) MCG/ACT inhaler Inhale 2 puffs into the lungs every 6 hours as needed for wheezing. 12/13/21     ALPRAZolam (XANAX) 0.5 MG tablet Take 1 tablet (0.5 mg total) by mouth 3 (three) times daily as needed for Sleep 01/27/22     nicotine (NICODERM CQ - DOSED IN MG/24 HOURS) 14 mg/24hr patch Place 1 patch onto the skin daily for 14 days Patient not taking: Reported  on 03/13/2022 10/04/21     nitroGLYCERIN (NITROSTAT) 0.4 MG SL tablet Place 1 tablet (0.4 mg total) under the tongue every 5 (five) minutes as needed for chest pain. Patient not taking: Reported on 03/13/2022 07/05/21   Sidney Ace, MD    Inpatient Medications: Scheduled Meds:  aspirin EC  81 mg Oral Daily   clopidogrel  75 mg Oral Q breakfast   diphenhydrAMINE  50 mg Oral Once   Or   diphenhydrAMINE  50 mg Intravenous Once   DULoxetine  30 mg Oral Daily   fentaNYL (SUBLIMAZE) injection  50 mcg Intravenous Once   methylPREDNISolone (SOLU-MEDROL) injection  40 mg Intravenous Q4H   metoprolol tartrate  2.5 mg Intravenous Once   nicotine  21 mg Transdermal Daily   rosuvastatin  20 mg Oral Daily   sodium chloride flush  3 mL Intravenous Q12H   Continuous Infusions:  sodium chloride     [START ON 03/15/2022] sodium chloride     Followed by   Derrill Memo ON 03/15/2022] sodium chloride     heparin 1,250 Units/hr  (03/14/22 0414)   nitroGLYCERIN Stopped (03/13/22 2210)   PRN Meds: sodium chloride, acetaminophen, albuterol, ALPRAZolam, ondansetron (ZOFRAN) IV, sodium chloride flush  Allergies:   Allergies  Allergen Reactions   Buspirone     Other reaction(s): Other (See Comments), Other (See Comments) Gi UPSET Gi UPSET Gi UPSET Other reaction(s): Other (See Comments) Gi UPSET Gi UPSET    Citalopram Nausea Only   Metrizamide Hives   Sulfa Antibiotics Hives   Contrast Media [Iodinated Contrast Media] Hives   Sulfa Drugs Cross Reactors    Sulfasalazine Other (See Comments)    Social History:   Social History   Socioeconomic History   Marital status: Single    Spouse name: Not on file   Number of children: Not on file   Years of education: Not on file   Highest education level: Not on file  Occupational History   Not on file  Tobacco Use   Smoking status: Every Day    Years: 30.00    Types: Cigarettes   Smokeless tobacco: Never   Tobacco comments:    Has been smoking for 30 years Wants to quit after she gets past all the heart stuff and changes in life.  Smoking 3 a day right now  Vaping Use   Vaping Use: Never used  Substance and Sexual Activity   Alcohol use: No   Drug use: Never   Sexual activity: Not Currently  Other Topics Concern   Not on file  Social History Narrative   Lives with fiance, Juluis Rainier.   Social Determinants of Health   Financial Resource Strain: Not on file  Food Insecurity: Not on file  Transportation Needs: Not on file  Physical Activity: Not on file  Stress: Not on file  Social Connections: Not on file  Intimate Partner Violence: Not on file     Family History:   Family History  Problem Relation Age of Onset   Depression Mother    Cancer Father        Father   Hypertension Sister    Breast cancer Maternal Grandmother    Cancer Paternal Grandfather        Breast     ROS:  Review of Systems  Constitutional:  Positive for  malaise/fatigue. Negative for chills, diaphoresis, fever and weight loss.  HENT:  Negative for congestion.   Eyes:  Negative for discharge and redness.  Respiratory:  Negative for cough, sputum production, shortness of breath and wheezing.   Cardiovascular:  Positive for chest pain and palpitations. Negative for orthopnea, claudication, leg swelling and PND.  Gastrointestinal:  Negative for abdominal pain, heartburn, nausea and vomiting.  Musculoskeletal:  Negative for falls and myalgias.  Skin:  Negative for rash.  Neurological:  Positive for weakness. Negative for dizziness, tingling, tremors, sensory change, speech change, focal weakness and loss of consciousness.  Endo/Heme/Allergies:  Does not bruise/bleed easily.  Psychiatric/Behavioral:  Negative for substance abuse. The patient is not nervous/anxious.   All other systems reviewed and are negative.     Physical Exam/Data:   Vitals:   03/14/22 0545 03/14/22 0600 03/14/22 0615 03/14/22 0645  BP: (!) 82/54 (!) 86/55 (!) 87/56 (!) 88/46  Pulse: 67 72 73 72  Resp: 19 (!) '23 20 20  '$ Temp:      TempSrc:      SpO2:      Weight:      Height:       No intake or output data in the 24 hours ending 03/14/22 0759 Filed Weights   03/13/22 1800  Weight: 84.4 kg   Body mass index is 35.14 kg/m.   Physical Exam: General: Well developed, well nourished, in no acute distress. Head: Normocephalic, atraumatic, sclera non-icteric, no xanthomas, nares without discharge.  Neck: Negative for carotid bruits. JVD not elevated. Lungs: Clear bilaterally to auscultation without wheezes, rales, or rhonchi. Breathing is unlabored. Heart: RRR with S1 S2. No murmurs, rubs, or gallops appreciated. Abdomen: Soft, non-tender, non-distended with normoactive bowel sounds. No hepatomegaly. No rebound/guarding. No obvious abdominal masses. Msk:  Strength and tone appear normal for age. Extremities: No clubbing or cyanosis. No edema. Distal pedal pulses are 2+  and equal bilaterally. Neuro: Alert and oriented X 3. No facial asymmetry. No focal deficit. Moves all extremities spontaneously. Psych:  Responds to questions appropriately with a normal affect.   EKG:  The EKG was personally reviewed and demonstrates: NSR, 85 bpm, prior inferior infarct, nonspecific st/t changes Telemetry:  Telemetry was personally reviewed and demonstrates: SR, rare PVCs, artifact  Weights: Filed Weights   03/13/22 1800  Weight: 84.4 kg    Relevant CV Studies:  [facility] 11/07/2021:   Mid LAD lesion is 30% stenosed.   Prox RCA lesion is 20% stenosed.   2nd Diag lesion is 50% stenosed.   There is moderate left ventricular systolic dysfunction.   LV end diastolic pressure is mildly elevated.   1.  Patent proximal RCA stent with mild in-stent restenosis.  No obstructive disease affecting the left coronary artery system. 2.  Moderately reduced LV systolic function with an EF of 35% with global hypokinesis that is more prominent in the inferior apical area. 3.  Right heart catheterization showed mildly to moderately elevated filling pressures, minimal pulmonary hypertension and normal cardiac output.   Recommendations: Continue medical therapy for coronary artery disease. Optimize heart failure treatment.  The patient is volume overloaded.  I increased Lasix to 40 mg once daily.  In addition, due to resting sinus tachycardia, increased Toprol to 50 mg once daily. We will plan on adding an ARB and SGLT2 inhibitor upon follow-up. __________  2D echo Lisa Davila) 08/29/2021: INTERPRETATION  MODERATE LV SYSTOLIC DYSFUNCTION (See above)  NORMAL RIGHT VENTRICULAR SYSTOLIC FUNCTION  TRIVIAL REGURGITATION NOTED (See above)  NO VALVULAR STENOSIS  TRIVIAL MR, TR  EF 35%   Laboratory Data:  Chemistry Recent Labs  Lab 03/13/22 1719 03/14/22 0148 03/14/22 0619  NA  141 138 138  K 2.7* 4.3 4.2  CL 119* 113* 115*  CO2 17* 20* 20*  GLUCOSE 94 106* 98  BUN '6 6 6   '$ CREATININE 0.65 0.75 0.76  CALCIUM 6.7* 8.5* 8.7*  GFRNONAA >60 >60 >60  ANIONGAP 5 5 3*    No results for input(s): "PROT", "ALBUMIN", "AST", "ALT", "ALKPHOS", "BILITOT" in the last 168 hours. Hematology Recent Labs  Lab 03/13/22 1849 03/14/22 0619  WBC 15.9* 8.9  RBC 4.45 3.79*  HGB 13.7 11.9*  HCT 40.2 34.7*  MCV 90.3 91.6  MCH 30.8 31.4  MCHC 34.1 34.3  RDW 12.5 12.7  PLT 309 264   Cardiac EnzymesNo results for input(s): "TROPONINI" in the last 168 hours. No results for input(s): "TROPIPOC" in the last 168 hours.  BNP Recent Labs  Lab 03/13/22 1849  BNP 92.8    DDimer No results for input(s): "DDIMER" in the last 168 hours.  Radiology/Studies:  DG Chest 2 View  Result Date: 03/13/2022 IMPRESSION: No active cardiopulmonary disease. Electronically Signed   By: Donavan Foil M.D.   On: 03/13/2022 17:40   US ARTERIAL LOWER EXTREMITY DUPLEX RIGHT(NON-ABI)  Result Date: 03/10/2022 IMPRESSION: Mild scattered atherosclerotic plaque without evidence of significant stenosis or occlusion. Signed, Criselda Peaches, MD, [facility] Electronically Signed   By: Jacqulynn Cadet M.D.   On: 03/10/2022 13:43    Assessment and Plan:   1. CAD involving the native coronary arteries with NSTEMI: -Currently chest pain free -HS-Tn has trended to 5742, cycle to peak -ASA/Plavix -Heparin gtt -NPO -Plan for LHC today -Echo pending -UDS pending -Aggressive risk factor modification   2. HFrEF secondary to ICM: -Appears euvolemic -Echo pending -Soft BP precludes GDMT at this time, escalate as able throughout admission and in follow up -BNP normal  3. Palpitations/PSVT: -Recent Zio patch in 11/2021 showed no evidence of arrhythmia -Monitor on tele -May need to repeat Zio patch if no significant arrhythmias are noted on tele -Hypokalemia repleted -Magnesium at goal -Recent TSH normal  4. HLD: -LDL  137 -Question if there is some nonadherence to Lipitor -Goal LDL ideally < 55 -Started on Crestor at admission -Follow up lipid panel in 2 months, if LDL remains above goal then would escalate therapy   5. Tobacco use: -Nicotine patch  -Complete cessation has been recommended to the patient  6. Hypokalemia: -Repleted    Shared Decision Making/Informed Consent{  The risks [stroke (1 in 1000), death (1 in 1000), kidney failure [usually temporary] (1 in 500), bleeding (1 in 200), allergic reaction [possibly serious] (1 in 200)], benefits (diagnostic support and management of coronary artery disease) and alternatives of a cardiac catheterization were discussed in detail with Ms. Grater and she is willing to proceed.    For questions or updates, please contact [place] Please consult [email or web address] for contact info under Cardiology/STEMI.   Signed, Christell Faith, PA-C [facility] Pager: [phone] 03/14/2022, 7:59 AM

## 2022-03-14 NOTE — Consult Note (Signed)
ANTICOAGULATION CONSULT NOTE - Follow Up Consult  Pharmacy Consult for heparin gtt Indication: chest pain/ACS  Allergies  Allergen Reactions   Buspirone     Other reaction(s): Other (See Comments), Other (See Comments) Gi UPSET Gi UPSET Gi UPSET Other reaction(s): Other (See Comments) Gi UPSET Gi UPSET    Citalopram Nausea Only   Metrizamide Hives   Sulfa Antibiotics Hives   Contrast Media [Iodinated Contrast Media] Hives   Sulfa Drugs Cross Reactors    Sulfasalazine Other (See Comments)    Patient Measurements: Height: '5\' 1"'$  (154.9 cm) Weight: 84.4 kg (186 lb) IBW/kg (Calculated) : 47.8 Heparin Dosing Weight: 67.1 kg  Vital Signs: Temp: 98.2 F (36.8 C) (11/07 0412) Temp Source: Oral (11/07 0412) BP: 100/70 (11/07 0730) Pulse Rate: 73 (11/07 0815)  Labs: Recent Labs    03/13/22 1719 03/13/22 1849 03/14/22 0148 03/14/22 0619 03/14/22 0805  HGB  --  13.7  --  11.9*  --   HCT  --  40.2  --  34.7*  --   PLT  --  309  --  264  --   APTT  --  26  --   --   --   LABPROT  --  12.5  --   --   --   INR  --  0.9  --   --   --   HEPARINUNFRC  --   --  0.53  --  0.42  CREATININE 0.65  --  0.75 0.76  --   TROPONINIHS 936* 2,036* 5,742*  --   --      Estimated Creatinine Clearance: 86.6 mL/min (by C-G formula based on SCr of 0.76 mg/dL).   Medications: Heparin Dosing Weight: 67.1 kg PTA: Plavix '75mg'$  QD, no AC PTA Inpatient: Plavix continued; +Heparin gtt  Assessment: 46yo F w/ h/o MDD, HLD, HTN, sysCHF, CAD (MI in 06/2021 s/p stent & Cath in 11/2021), & LBP presenting with c/o CP. Pt on APT with plavix, no history of AC in chart review. Pharmacy consulted for the initiation and mgmt of heparin gtt.       Baseline Labs: aPTT - 26; INR - 0.9 Hgb - 13.7; Plts - 309  Goal of Therapy:  Heparin level 0.3-0.7 units/ml Monitor platelets by anticoagulation protocol: Yes   Plan:  11/7:  HL @ 0148 = 0.53, therapeutic X 1 11/7:  HL @ 0805 = 0.42, therapeutic X  2 Will continue pt on current rate and recheck HL on 11/8 @ 0500.   Alison Murray 03/14/2022,10:24 AM

## 2022-03-14 NOTE — Brief Op Note (Signed)
BRIEF CARDIAC CATHETERIZATION NOTE  03/14/2022  3:09 PM  PATIENT:  Lisa Davila  46 y.o. female  PRE-OPERATIVE DIAGNOSIS:  non ST segment myocardial infarction  POST-OPERATIVE DIAGNOSIS:  Same  PROCEDURE:  Procedure(s): LEFT HEART CATH AND CORONARY ANGIOGRAPHY (N/A) CORONARY STENT INTERVENTION (N/A) Coronary Thrombectomy (N/A)  SURGEON:  Surgeon(s) and Role:    * Marguarite Markov, MD - Primary  FINDINGS: Severe single vessel CAD with occlusion of proximal/mid RCA with late stent thrombosis and heavy thrombus burden. Non-obstructive CAD observed in LCA. Mildly elevated left ventricular filling pressure (LVEDP 20 mmHg). Successful mechanical thrombectomy and drug-eluting stent placement to proximal/mid RCA with 0% residual stenosis and TIMI-3 flow.  RECOMMENDATIONS: Tirofiban infusion x 4 hours. Continue DAPT with aspirin and prasugrel for at least 12 months.  Check CYP 2C19 genotype to assess clopidogrel response. Follow-up echocardiogram. Aggressive secondary prevention of coronary artery disease and escalation of evidence-based heart failure therapy as blood pressure allows.  Nelva Bush, MD [facility]

## 2022-03-14 NOTE — ED Notes (Signed)
Patient resting quietly.  Awoke easily for morning labs.  She denies pain.  She is still inquiring on a nicotine patch.  I will message the MD again.

## 2022-03-14 NOTE — Interval H&P Note (Signed)
History and Physical Interval Note:  03/14/2022 1:03 PM  Lisa Davila  has presented today for surgery, with the diagnosis of non ST segment myocardial infarction.  The various methods of treatment have been discussed with the patient and family. After consideration of risks, benefits and other options for treatment, the patient has consented to  Procedure(s): LEFT HEART CATH AND CORONARY ANGIOGRAPHY (N/A) as a surgical intervention.  The patient's history has been reviewed, patient examined, no change in status, stable for surgery.  I have reviewed the patient's chart and labs.  Questions were answered to the patient's satisfaction.    Cath Lab Visit (complete for each Cath Lab visit)  Clinical Evaluation Leading to the Procedure:   ACS: Yes.    Non-ACS:  N/A  Bobbette Eakes

## 2022-03-14 NOTE — Consult Note (Signed)
ANTICOAGULATION CONSULT NOTE - Follow Up Consult  Pharmacy Consult for heparin gtt Indication: chest pain/ACS  Allergies  Allergen Reactions   Buspirone     Other reaction(s): Other (See Comments), Other (See Comments) Gi UPSET Gi UPSET Gi UPSET Other reaction(s): Other (See Comments) Gi UPSET Gi UPSET    Citalopram Nausea Only   Metrizamide Hives   Sulfa Antibiotics Hives   Contrast Media [Iodinated Contrast Media] Hives   Sulfa Drugs Cross Reactors    Sulfasalazine Other (See Comments)    Patient Measurements: Height: '5\' 1"'$  (154.9 cm) Weight: 84.4 kg (186 lb) IBW/kg (Calculated) : 47.8 Heparin Dosing Weight: 67.1 kg  Vital Signs: Temp: 98.1 F (36.7 C) (11/06 2224) Temp Source: Oral (11/06 2224) BP: 90/57 (11/07 0200) Pulse Rate: 79 (11/07 0200)  Labs: Recent Labs    03/13/22 1719 03/13/22 1849 03/14/22 0148  HGB  --  13.7  --   HCT  --  40.2  --   PLT  --  309  --   APTT  --  26  --   LABPROT  --  12.5  --   INR  --  0.9  --   HEPARINUNFRC  --   --  0.53  CREATININE 0.65  --  0.75  TROPONINIHS 936* 2,036* 5,742*     Estimated Creatinine Clearance: 86.6 mL/min (by C-G formula based on SCr of 0.75 mg/dL).   Medications: Heparin Dosing Weight: 67.1 kg PTA: Plavix '75mg'$  QD, no AC PTA Inpatient: Plavix continued; +Heparin gtt  Assessment: 46yo F w/ h/o MDD, HLD, HTN, sysCHF, CAD (MI in 06/2021 s/p stent & Cath in 11/2021), & LBP presenting with c/o CP. Pt on APT with plavix, no history of AC in chart review. Pharmacy consulted for the initiation and mgmt of heparin gtt.  Date Time aPTT/HL Rate/Comment       Baseline Labs: aPTT - ordered; INR - ordered Hgb - inprocess; Plts - pending  Goal of Therapy:  Heparin level 0.3-0.7 units/ml Monitor platelets by anticoagulation protocol: Yes   Plan:  11/7:  HL @ 0148 = 0.53, therapeutic X 1 Will continue pt on current rate and recheck HL on 11/7 @ 0800.   Weaver Tweed D 03/14/2022,3:00 AM

## 2022-03-14 NOTE — Assessment & Plan Note (Signed)
History of CAD C/B STEMI s/p DES to RCA.  Most recent heart cath in July 2023 demonstrated proximal RCA stent with mild in-stent restenosis, moderately reduced LV function with EF of 35% with global hypokinesis and minimal pulmonary hypertension.  - Management of NSTEMI as noted above

## 2022-03-14 NOTE — Assessment & Plan Note (Addendum)
Patient has a long-term history of hyperlipidemia dating back to 2013.  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  Currently on atorvastatin 80 mg daily for the past 5 months with LDL still elevated at 156.  I suspect patient may benefit from the addition of Zetia, and potentially even a PCSK9 inhibitor, however that is financially prohibitive at this time the setting of health insurance loss.  - Per patient preference, we will switch from high intensity atorvastatin to high intensity rosuvastatin - Consider addition of Zetia prior to discharge - Repeat lipid panel pending

## 2022-03-15 ENCOUNTER — Encounter: Payer: Self-pay | Admitting: Internal Medicine

## 2022-03-15 ENCOUNTER — Other Ambulatory Visit: Payer: Self-pay

## 2022-03-15 DIAGNOSIS — I214 Non-ST elevation (NSTEMI) myocardial infarction: Secondary | ICD-10-CM | POA: Diagnosis not present

## 2022-03-15 LAB — CBC
HCT: 35.7 % — ABNORMAL LOW (ref 36.0–46.0)
Hemoglobin: 12.3 g/dL (ref 12.0–15.0)
MCH: 31.7 pg (ref 26.0–34.0)
MCHC: 34.5 g/dL (ref 30.0–36.0)
MCV: 92 fL (ref 80.0–100.0)
Platelets: 306 10*3/uL (ref 150–400)
RBC: 3.88 MIL/uL (ref 3.87–5.11)
RDW: 12.9 % (ref 11.5–15.5)
WBC: 17.5 10*3/uL — ABNORMAL HIGH (ref 4.0–10.5)
nRBC: 0 % (ref 0.0–0.2)

## 2022-03-15 LAB — BASIC METABOLIC PANEL
Anion gap: 7 (ref 5–15)
BUN: 9 mg/dL (ref 6–20)
CO2: 20 mmol/L — ABNORMAL LOW (ref 22–32)
Calcium: 8.8 mg/dL — ABNORMAL LOW (ref 8.9–10.3)
Chloride: 110 mmol/L (ref 98–111)
Creatinine, Ser: 0.82 mg/dL (ref 0.44–1.00)
GFR, Estimated: >60 mL/min (ref >60)
Glucose, Bld: 118 mg/dL — ABNORMAL HIGH (ref 70–99)
Potassium: 4 mmol/L (ref 3.5–5.1)
Sodium: 137 mmol/L (ref 135–145)

## 2022-03-15 MED ORDER — METOPROLOL SUCCINATE ER 25 MG PO TB24: 25.0000 mg | ORAL_TABLET | Freq: Every day | ORAL | Status: DC

## 2022-03-15 MED ORDER — NICOTINE 14 MG/24HR TD PT24
MEDICATED_PATCH | TRANSDERMAL | 0 refills | Status: DC
  Filled 2022-03-15: qty 28, fill #0
  Filled 2022-08-10 (×2): qty 14, 14d supply, fill #0

## 2022-03-15 MED ORDER — ROSUVASTATIN CALCIUM 10 MG PO TABS: 40.0000 mg | ORAL_TABLET | Freq: Every day | ORAL | Status: DC

## 2022-03-15 MED ORDER — FUROSEMIDE 20 MG PO TABS
20.0000 mg | ORAL_TABLET | Freq: Every day | ORAL | 0 refills | Status: DC
  Filled 2022-03-15: qty 30, 30d supply, fill #0

## 2022-03-15 MED ORDER — ROSUVASTATIN CALCIUM 40 MG PO TABS
40.0000 mg | ORAL_TABLET | Freq: Every day | ORAL | 0 refills | Status: DC
  Filled 2022-03-15: qty 30, 30d supply, fill #0

## 2022-03-15 MED ORDER — METOPROLOL SUCCINATE ER 25 MG PO TB24
25.0000 mg | ORAL_TABLET | Freq: Every day | ORAL | 0 refills | Status: DC
  Filled 2022-03-15: qty 30, 30d supply, fill #0

## 2022-03-15 MED ORDER — PRASUGREL HCL 10 MG PO TABS
10.0000 mg | ORAL_TABLET | Freq: Every day | ORAL | 0 refills | Status: DC
  Filled 2022-03-15: qty 30, 30d supply, fill #0

## 2022-03-15 MED ORDER — POTASSIUM CHLORIDE ER 20 MEQ PO TBCR
20.0000 meq | EXTENDED_RELEASE_TABLET | Freq: Every day | ORAL | 0 refills | Status: DC
  Filled 2022-03-15: qty 30, 30d supply, fill #0

## 2022-03-15 MED ORDER — NITROGLYCERIN 0.4 MG SL SUBL
0.4000 mg | SUBLINGUAL_TABLET | SUBLINGUAL | 0 refills | Status: DC | PRN
  Filled 2022-03-15: qty 25, 8d supply, fill #0
  Filled 2022-08-10: qty 50, 1d supply, fill #0

## 2022-03-15 NOTE — Progress Notes (Signed)
Pt noted with low BP, asymptomatic, denies being light-headed or dizzy, ambulates to BR without issues. Will continue to monitor and handoff to oncoming RN. SRP RN

## 2022-03-15 NOTE — TOC Initial Note (Signed)
Transition of Care [facility]) - Initial/Assessment Note    Patient Details  Name: Lisa Davila MRN: [id number] Date of Birth: Jan 22, 1976  Transition of [facility]) CM/SW Contact:    Tiburcio Bash, LCSW Phone Number: 03/15/2022, 10:48 AM  Clinical Narrative:                  Readmission risk assessment completed at bedside, patient reports continuing to see PCP Dr. Ginette Pitman, uses [facility] employee pharmacy. Patient reports not having insurance now, [facility] has consulted pharmacy on this to ensure patient can get her medication from med management free of charge at dc. Patient reports no further dc needs at this time.   Expected Discharge Plan: Home/Self Care Barriers to Discharge: No Barriers Identified   Patient Goals and CMS Choice Patient states their goals for this hospitalization and ongoing recovery are:: to go home CMS Medicare.gov Compare Post Acute Care list provided to:: Patient    Expected Discharge Plan and Services Expected Discharge Plan: Home/Self Care       Living arrangements for the past 2 months: Single Family Home                                      Prior Living Arrangements/Services Living arrangements for the past 2 months: Single Family Home Lives with:: Self                   Activities of Daily Living Home Assistive Devices/Equipment: None ADL Screening (condition at time of admission) Patient's cognitive ability adequate to safely complete daily activities?: Yes Is the patient deaf or have difficulty hearing?: No Does the patient have difficulty seeing, even when wearing glasses/contacts?: No Does the patient have difficulty concentrating, remembering, or making decisions?: No Patient able to express need for assistance with ADLs?: Yes Does the patient have difficulty dressing or bathing?: No Independently performs ADLs?: Yes (appropriate for developmental age) Does the patient have difficulty walking or climbing stairs?: No Weakness of  Legs: None Weakness of Arms/Hands: None  Permission Sought/Granted                  Emotional Assessment       Orientation: : Oriented to Self, Oriented to Place, Oriented to  Time, Oriented to Situation Alcohol / Substance Use: Not Applicable Psych Involvement: No (comment)  Admission diagnosis:  NSTEMI (non-ST elevated myocardial infarction) [facility]) [I21.4] Patient Active Problem List   Diagnosis Date Noted   NSTEMI (non-ST elevated myocardial infarction) ([place]) 03/13/2022   Hypotension 38/18/2993   Chronic systolic heart failure (HCC)    Coronary artery disease    ST elevation myocardial infarction (STEMI) of inferior wall ([place]) 07/04/2021   SVT (supraventricular tachycardia) 07/04/2021   Depression with anxiety 07/04/2021   Hyperlipidemia 07/04/2021   Tobacco user 07/04/2021   Leukocytosis 07/04/2021   Diarrhea 07/04/2021   Hx of cervical cancer 06/01/2020   Hypertension 06/01/2020   Migraines 06/01/2020   Sialolithiasis 06/01/2020   Tobacco use disorder 06/01/2020   Hoarseness 04/06/2020   Oral thrush 04/06/2020   Leg cramping 03/25/2020   Iatrogenic adrenal insufficiency ([place]) 02/20/2020   Hypokalemia 01/25/2020   Weakness of both legs 01/25/2020   Nontraumatic incomplete tear of left rotator cuff 01/20/2019   Tendinitis of upper biceps tendon of left shoulder 01/20/2019   Rotator cuff tendinitis, left 12/30/2018   Palpitation 01/08/2018   Hypercholesteremia 01/08/2018   Stress at  work 01/08/2018   Trochanteric bursitis of left hip 11/30/2016   Benzodiazepine misuse 05/03/2016   Cervical cancer screening 05/03/2016   Chronic sinusitis 03/16/2015   Nasal obstruction 03/16/2015   High risk medication use 11/03/2014   Other long term (current) drug therapy 11/03/2014   Polypharmacy 11/03/2014   Arthralgia of temporomandibular joint, unspecified side 10/24/2013   Carpal tunnel syndrome 08/04/2013   Lateral epicondylitis 08/04/2013   Knee pain 02/03/2013    Muscle strain 02/03/2013   Pain in left knee 02/03/2013   Shoulder pain 02/03/2013   Dysphagia, pharyngeal 09/11/2012   Cardiac arrhythmia, unspecified 07/29/2012   Contraceptive management 07/29/2012   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: [phone]: [phone]    Social Determinants of Health (SDOH) Interventions    Readmission Risk Interventions     No data to display

## 2022-03-15 NOTE — Progress Notes (Signed)
Rounding Note    Patient Name: Lisa Davila Date of Encounter: 03/15/2022  [place] Cardiologist: Kathlyn Sacramento, MD   Subjective   Denies chest pain or shortness of breath, ready to go home.  Inpatient Medications    Scheduled Meds:  aspirin EC  81 mg Oral Daily   DULoxetine  30 mg Oral Daily   enoxaparin (LOVENOX) injection  40 mg Subcutaneous Q24H   fentaNYL (SUBLIMAZE) injection  50 mcg Intravenous Once   metoprolol succinate  25 mg Oral Daily   nicotine  21 mg Transdermal Daily   prasugrel  10 mg Oral Daily   rosuvastatin  40 mg Oral Daily   sodium chloride flush  3 mL Intravenous Q12H   Continuous Infusions:  sodium chloride     PRN Meds: sodium chloride, acetaminophen, albuterol, ALPRAZolam, alum & mag hydroxide-simeth, ondansetron (ZOFRAN) IV, sodium chloride flush   Vital Signs    Vitals:   03/14/22 2331 03/15/22 0431 03/15/22 0651 03/15/22 0733  BP: (!) 81/54 (!) 81/62 (!) 82/52 98/67  Pulse: 88 83 78 87  Resp: '19 20  14  '$ Temp: 98.3 F (36.8 C) 98 F (36.7 C)  97.9 F (36.6 C)  TempSrc:      SpO2: 99% 98%  98%  Weight:      Height:        Intake/Output Summary (Last 24 hours) at 03/15/2022 1310 Last data filed at 03/15/2022 0800 Gross per 24 hour  Intake 1086.76 ml  Output --  Net 1086.76 ml      03/14/2022   11:46 AM 03/13/2022    6:00 PM 03/04/2022    6:33 AM  Last 3 Weights  Weight (lbs) 186 lb 186 lb 186 lb  Weight (kg) 84.369 kg 84.369 kg 84.369 kg      Telemetry    Sinus rhythm- Personally Reviewed  ECG     - Personally Reviewed  Physical Exam   GEN: No acute distress.   Neck: No JVD Cardiac: RRR, no murmurs, rubs, or gallops.  Respiratory: Clear to auscultation bilaterally. GI: Soft, nontender, non-distended  MS: No edema; No deformity. Neuro:  Nonfocal  Psych: Normal affect   Labs    High Sensitivity Troponin:   Recent Labs  Lab 03/13/22 1719 03/13/22 1849 03/14/22 0148  TROPONINIHS 936*  2,036* 5,742*     Chemistry Recent Labs  Lab 03/13/22 1849 03/14/22 0148 03/14/22 0619 03/15/22 0504  NA  --  138 138 137  K  --  4.3 4.2 4.0  CL  --  113* 115* 110  CO2  --  20* 20* 20*  GLUCOSE  --  106* 98 118*  BUN  --  '6 6 9  '$ CREATININE  --  0.75 0.76 0.82  CALCIUM  --  8.5* 8.7* 8.8*  MG 2.0  --   --   --   GFRNONAA  --  >60 >60 >60  ANIONGAP  --  5 3* 7    Lipids  Recent Labs  Lab 03/14/22 0148  CHOL 201*  TRIG 180*  HDL 28*  LDLCALC 137*  CHOLHDL 7.2    Hematology Recent Labs  Lab 03/13/22 1849 03/14/22 0619 03/15/22 0504  WBC 15.9* 8.9 17.5*  RBC 4.45 3.79* 3.88  HGB 13.7 11.9* 12.3  HCT 40.2 34.7* 35.7*  MCV 90.3 91.6 92.0  MCH 30.8 31.4 31.7  MCHC 34.1 34.3 34.5  RDW 12.5 12.7 12.9  PLT 309 264 306   Thyroid No results  for input(s): "TSH", "FREET4" in the last 168 hours.  BNP Recent Labs  Lab 03/13/22 1849  BNP 92.8    DDimer No results for input(s): "DDIMER" in the last 168 hours.   Radiology    CARDIAC CATHETERIZATION  Result Date: 03/14/2022 Conclusions: Severe single vessel CAD with occlusion of proximal/mid RCA with late stent thrombosis and heavy thrombus burden. Non-obstructive coronary artery disease observed in the left coronary artery, similar to prior cath from 11/2021. Mildly elevated left ventricular filling pressure (LVEDP 20 mmHg). Successful mechanical thrombectomy and drug-eluting stent placement to proximal/mid RCA using Onyx Frontier 3.0 x 26 mm drug-eluting stent (postdilated up to 4.0 mm) with 0% residual stenosis and TIMI-3 flow.  Recommendations: Tirofiban infusion x 4 hours. Continue dual antiplatelet therapy with aspirin and prasugrel for at least 12 months.  Check CYP 2C19 genotype to assess clopidogrel response. Follow-up echocardiogram. Aggressive secondary prevention of coronary artery disease and escalation of evidence-based heart failure therapy as blood pressure allows. Nelva Bush, MD [facility]  ECHOCARDIOGRAM COMPLETE  Result Date: 03/14/2022    ECHOCARDIOGRAM REPORT   Patient Name:   Lisa Davila Date of Exam: 03/14/2022 Medical Rec #:  [id number]         Height:       61.0 in Accession #:    [id number]        Weight:       186.0 lb Date of Birth:  20-Feb-1976         BSA:          1.831 m Patient Age:    46 years          BP:           100/70 mmHg Patient Gender: F                 HR:           73 bpm. Exam Location:  [facility] Procedure: 2D Echo, Cardiac Doppler, Color Doppler and Intracardiac            Opacification Agent Indications:     NSTEMI I21.4  History:         Patient has prior history of Echocardiogram examinations, most                  recent 07/04/2021. CHF, Signs/Symptoms:Chest Pain; Risk                  Factors:Hypertension.  Sonographer:     Sherrie Sport Referring Phys:  [id number] [place] Diagnosing Phys: Nelva Bush MD  Sonographer Comments: Suboptimal apical window and no subcostal window. IMPRESSIONS  1. Left ventricular ejection fraction, by estimation, is 35 to 40%. The left ventricle has moderately decreased function. The left ventricle demonstrates regional wall motion abnormalities (see scoring diagram/findings for description). There is mild left ventricular hypertrophy. Left ventricular diastolic parameters are consistent with Grade I diastolic dysfunction (impaired relaxation). There is severe hypokinesis of the left ventricular, entire lateral wall and inferolateral wall.  2. Right ventricular systolic function is mildly reduced. The right ventricular size is normal.  3. The mitral valve is normal in structure. Trivial mitral valve regurgitation.  4. The aortic valve was not well visualized. Aortic valve regurgitation is not visualized. No aortic stenosis is present. FINDINGS  Left Ventricle: Left ventricular ejection fraction, by estimation, is 35 to 40%. The left ventricle has moderately decreased function. The left ventricle demonstrates regional wall  motion abnormalities. Severe hypokinesis of the  left ventricular, entire  lateral wall and inferolateral wall. Definity contrast agent was given IV to delineate the left ventricular endocardial borders. The left ventricular internal cavity size was normal in size. There is mild left ventricular hypertrophy. Left ventricular diastolic parameters are consistent with Grade I diastolic dysfunction (impaired relaxation). Right Ventricle: The right ventricular size is normal. No increase in right ventricular wall thickness. Right ventricular systolic function is mildly reduced. Left Atrium: Left atrial size was normal in size. Right Atrium: Right atrial size was normal in size. Pericardium: There is no evidence of pericardial effusion. Mitral Valve: The mitral valve is normal in structure. Trivial mitral valve regurgitation. Tricuspid Valve: The tricuspid valve is not well visualized. Tricuspid valve regurgitation is trivial. Aortic Valve: The aortic valve was not well visualized. Aortic valve regurgitation is not visualized. No aortic stenosis is present. Aortic valve mean gradient measures 4.3 mmHg. Aortic valve peak gradient measures 6.8 mmHg. Aortic valve area, by VTI measures 1.80 cm. Pulmonic Valve: The pulmonic valve was not well visualized. Pulmonic valve regurgitation is not visualized. No evidence of pulmonic stenosis. Aorta: The aortic root is normal in size and structure. Pulmonary Artery: The pulmonary artery is of normal size. IAS/Shunts: The interatrial septum was not well visualized.  LEFT VENTRICLE PLAX 2D LVIDd:         4.90 cm   Diastology LVIDs:         3.40 cm   LV e' medial:    7.40 cm/s LV PW:         1.10 cm   LV E/e' medial:  9.2 LV IVS:        1.10 cm   LV e' lateral:   8.38 cm/s LVOT diam:     1.90 cm   LV E/e' lateral: 8.1 LV SV:         46 LV SV Index:   25 LVOT Area:     2.84 cm  RIGHT VENTRICLE RV S prime:     10.70 cm/s TAPSE (M-mode): 2.5 cm LEFT ATRIUM             Index        RIGHT  ATRIUM           Index LA diam:        2.80 cm 1.53 cm/m   RA Area:     15.10 cm LA Vol (A2C):   32.6 ml 17.80 ml/m  RA Volume:   36.30 ml  19.82 ml/m LA Vol (A4C):   34.4 ml 18.78 ml/m LA Biplane Vol: 33.6 ml 18.35 ml/m  AORTIC VALVE AV Area (Vmax):    1.69 cm AV Area (Vmean):   1.53 cm AV Area (VTI):     1.80 cm AV Vmax:           130.00 cm/s AV Vmean:          94.700 cm/s AV VTI:            0.257 m AV Peak Grad:      6.8 mmHg AV Mean Grad:      4.3 mmHg LVOT Vmax:         77.40 cm/s LVOT Vmean:        51.000 cm/s LVOT VTI:          0.163 m LVOT/AV VTI ratio: 0.63  AORTA Ao Root diam: 3.27 cm MITRAL VALVE MV Area (PHT): 3.83 cm    SHUNTS MV Decel Time: 198 msec    Systemic VTI:  0.16 m MV E velocity: 68.10 cm/s  Systemic Diam: 1.90 cm MV A velocity: 92.20 cm/s MV E/A ratio:  0.74 Nelva Bush MD Electronically signed by Nelva Bush MD Signature Date/Time: 03/14/2022/3:40:34 PM    Final    DG Chest 2 View  Result Date: 03/13/2022 CLINICAL DATA:  Chest pain EXAM: CHEST - 2 VIEW COMPARISON:  10/25/2021 FINDINGS: The heart size and mediastinal contours are within normal limits. Both lungs are clear. The visualized skeletal structures are unremarkable. IMPRESSION: No active cardiopulmonary disease. Electronically Signed   By: Donavan Foil M.D.   On: 03/13/2022 17:40    Cardiac Studies   LHC 03/14/2022 Severe single vessel CAD with occlusion of proximal/mid RCA with late stent thrombosis and heavy thrombus burden. Non-obstructive coronary artery disease observed in the left coronary artery, similar to prior cath from 11/2021. Mildly elevated left ventricular filling pressure (LVEDP 20 mmHg). Successful mechanical thrombectomy and drug-eluting stent placement to proximal/mid RCA using Onyx Frontier 3.0 x 26 mm drug-eluting stent (postdilated up to 4.0 mm) with 0% residual stenosis and TIMI-3 flow.   Recommendations: Tirofiban infusion x 4 hours. Continue dual antiplatelet therapy with  aspirin and prasugrel for at least 12 months.  Check CYP 2C19 genotype to assess clopidogrel response. Follow-up echocardiogram. Aggressive secondary prevention of coronary artery disease and escalation of evidence-based heart failure therapy as blood pressure allows.  EF 35 to 40%.     Patient Profile     46 y.o. female CAD/PCI to RCA, ischemic cardiomyopathy, hypertension, hyperlipidemia, current smoker presenting with shortness of breath, palpitations, chest pain, diagnosed with NSTEMI.  Underwent left heart cath showing significant proximal to mid RCA stenosis.  S/p drug-eluting stent to look for CAD.  Assessment & Plan    NSTEMI -S/p PCI to RCA -Aspirin, Brilinta at least 1 year -Start Crestor 40 mg daily, Toprol-XL 25 mg daily  2.  Cardiomyopathy EF 35 to 40% -Toprol-XL 25 mg daily -Low blood pressures with systolics in the 75I preventing up titration of GDMT -Patient is euvolemic  3.  Current smoker -Cessation strongly advised  Okay for discharge from a cardiac perspective on above medications, close follow-up as outpatient advised.  Total encounter time more than 50 minutes  Greater than 50% was spent in counseling and coordination of care with the patient      Signed, Kate Sable, MD  03/15/2022, 1:10 PM

## 2022-03-15 NOTE — Discharge Summary (Signed)
Physician Discharge Summary  PENELOPI MIKRUT GYK:[id number] DOB: 1975-10-05 DOA: 03/13/2022  PCP: Tracie Harrier, MD  Admit date: 03/13/2022 Discharge date: 03/15/2022  Admitted From: Home Disposition:  Home  Recommendations for Outpatient Follow-up:  Follow up with PCP in 1-2 weeks Follow-up with cardiology 2 weeks  Home Health: No Equipment/Devices: None  Discharge Condition: Stable CODE STATUS: Full Diet recommendation: Heart healthy  Brief/Interim Summary:  46 y.o. female with medical history significant of CAD c/b STEMI s/p DES RCA (February 2023), HFpEF, SVT, hyperlipidemia, generalized anxiety, fibromyalgia, who presents to the ED with complaints of palpitations, diaphoresis and chest pain.   Ms. Dasher states she was in her normal state of health yesterday.  Today, she began to experience intermittent palpitations with associated shortness of breath.  Due to this, she went to take a nap and suddenly awoke at 4 PM on 11/6 with significant diaphoresis.  Due to concern this was an impending heart attack, she contacted EMS.  Once EMS arrived, she began to experience substernal chest pain that did not radiate.  Since arriving to the ED, she notes that her chest pain has improved with initiation of nitroglycerin.  She has not experienced any further episodes of palpitations.   Ms. Gargan states she has been taking her medications as prescribed and has not missed any recent doses.  She continues to smoke approximately 1 pack/day.   11/7: LHC planned today 11/8: Status post left heart catheterization.  Findings were single-vessel severe CAD with occlusion of proximal/mid RCA stent with late stent thrombosis and heavy thrombus burden.  Also observed nonobstructive coronary artery disease in LCA.  Elevated left ventricular filling pressure at 20 mmHg.  Status post successful mechanical thrombectomy and drug-eluting stent placement to proximal/mid RCA with 0% residual stenosis  Seen  by cardiology and discussed with them on time of discharge.  Recommend discontinuation of Plavix and initiation of Effient.  Stable for discharge home at this time.   Discharge Diagnoses:  Principal Problem:   NSTEMI (non-ST elevated myocardial infarction) ([place]) Active Problems:   Hypotension   SVT (supraventricular tachycardia)   Coronary artery disease   Leukocytosis   Chronic systolic heart failure (HCC)   Generalized anxiety disorder   Hypertension   Tobacco user   Hyperlipidemia   NSTEMI (non-ST elevated myocardial infarction) [facility]) Patient presenting with 1 day history of intermittent palpitations, sudden onset diaphoresis and subsequent up with set of chest pain.  Troponins are markedly elevated with no evidence of ST elevation on EKG consistent with NSTEMI.  Patient is at high risk for  obstructive CAD given recent history of STEMI with RCA DES, continued daily tobacco use, uncontrolled hyperlipidemia.  Cardiology has been consulted; discussed with Dr. Fletcher Anon and Dr. Haroldine Laws.  Recommending IV nitroglycerin and metoprolol as blood pressure tolerates, heparin infusion and plan for left heart cath tomorrow morning (11/7 AM).  -Status post left heart catheterization on 11/7.  Single-vessel CAD noted with stent thrombosis and heavy thrombus burden.  Status post successful revascularization with 0% residual stenosis  Plan: Discharge home.  Recommend dual antiplatelet therapy with aspirin and Effient.  Discontinue Plavix.  High intensity Crestor.  Diuretic reduced to 20 mg Lasix daily.  Aldactone held.  Follow-up with cardiology in 2 weeks.  Educated on smoking cessation.  Nicotine patches prescribed.  Discharge Instructions  Discharge Instructions     AMB Referral to Cardiac Rehabilitation - Phase II   Complete by: As directed    Diagnosis:  Coronary Stents NSTEMI  After initial evaluation and assessments completed: Virtual Based Care may be provided alone or in conjunction with  Phase 2 Cardiac Rehab based on patient barriers.: Yes   Intensive Cardiac Rehabilitation (ICR) [place] location only OR Traditional Cardiac Rehabilitation (TCR) *If criteria for ICR are not met will enroll in TCR [facility] only): Yes   Diet - low sodium heart healthy   Complete by: As directed    Increase activity slowly   Complete by: As directed       Allergies as of 03/15/2022       Reactions   Buspirone    Other reaction(s): Other (See Comments), Other (See Comments) Gi UPSET Gi UPSET Gi UPSET Other reaction(s): Other (See Comments) Gi UPSET Gi UPSET   Citalopram Nausea Only   Metrizamide Hives   Sulfa Antibiotics Hives   Contrast Media [iodinated Contrast Media] Hives   Sulfa Drugs Cross Reactors    Sulfasalazine Other (See Comments)        Medication List     STOP taking these medications    atorvastatin 80 MG tablet Commonly known as: LIPITOR   clopidogrel 75 MG tablet Commonly known as: PLAVIX   eplerenone 25 MG tablet Commonly known as: Inspra   spironolactone 25 MG tablet Commonly known as: ALDACTONE   topiramate 25 MG tablet Commonly known as: TOPAMAX       TAKE these medications    albuterol 108 (90 Base) MCG/ACT inhaler Commonly known as: VENTOLIN HFA Inhale 2 puffs into the lungs every 6 hours as needed for wheezing.   ALPRAZolam 0.5 MG tablet Commonly known as: XANAX Take 1 tablet (0.5 mg total) by mouth 3 (three) times daily as needed for Sleep   aspirin 81 MG chewable tablet Chew 1 tablet (81 mg total) by mouth daily.   cyanocobalamin 1000 MCG tablet Commonly known as: VITAMIN B12 Take 1 tablet (1,000 mcg total) by mouth once daily   DULoxetine 30 MG capsule Commonly known as: CYMBALTA Take 1 capsule (30 mg total) by mouth once daily   Farxiga 10 MG Tabs tablet Generic drug: dapagliflozin propanediol Take 1 tablet (10 mg total) by mouth daily before breakfast.   furosemide 20 MG tablet Commonly known as: LASIX Take 1 tablet (20 mg  total) by mouth daily. What changed:  medication strength how much to take   ipratropium-albuterol 0.5-2.5 (3) MG/3ML Soln Commonly known as: DUONEB Inhale 3 mls by nebulizer 4 times daily. (Take 3 mls by nebulization 4 (four) times daily for 30 days)   medroxyPROGESTERone 150 MG/ML injection Commonly known as: DEPO-PROVERA Inject into the muscle.   metoprolol succinate 25 MG 24 hr tablet Commonly known as: TOPROL-XL Take 1 tablet (25 mg total) by mouth daily. What changed:  medication strength how much to take   nicotine 14 mg/24hr patch Commonly known as: NICODERM CQ - dosed in mg/24 hours Place 1 patch onto the skin daily for 14 days   nitroGLYCERIN 0.4 MG SL tablet Commonly known as: NITROSTAT Place 1 tablet (0.4 mg total) under the tongue every 5 (five) minutes as needed for chest pain.   Potassium Chloride ER 20 MEQ Tbcr Take 1 tablet (20 mEq) by mouth daily. (Take 20 mEq by mouth daily.) What changed:  medication strength how much to take   prasugrel 10 MG Tabs tablet Commonly known as: EFFIENT Take 1 tablet (10 mg total) by mouth daily. Start taking on: March 16, 2022   rosuvastatin 40 MG tablet Commonly known as: CRESTOR Take 1  tablet (40 mg total) by mouth daily.   Vitamin D (Ergocalciferol) 1.25 MG (50000 UNIT) Caps capsule Commonly known as: DRISDOL Take one capsule by mouth once a week        Follow-up Information     Tracie Harrier, MD. Go in 1 week(s).   Specialty: Internal Medicine Why: Appointment on Friday, 03/17/2022 at 2:45pm. Contact information: [place] [address] [phone]                Allergies  Allergen Reactions   Buspirone     Other reaction(s): Other (See Comments), Other (See Comments) Gi UPSET Gi UPSET Gi UPSET Other reaction(s): Other (See Comments) Gi UPSET Gi UPSET    Citalopram Nausea Only   Metrizamide Hives   Sulfa Antibiotics Hives    Contrast Media [Iodinated Contrast Media] Hives   Sulfa Drugs Cross Reactors    Sulfasalazine Other (See Comments)    Consultations: Cardiology-[facility]   Procedures/Studies: CARDIAC CATHETERIZATION  Result Date: 03/14/2022 Conclusions: Severe single vessel CAD with occlusion of proximal/mid RCA with late stent thrombosis and heavy thrombus burden. Non-obstructive coronary artery disease observed in the left coronary artery, similar to prior cath from 11/2021. Mildly elevated left ventricular filling pressure (LVEDP 20 mmHg). Successful mechanical thrombectomy and drug-eluting stent placement to proximal/mid RCA using Onyx Frontier 3.0 x 26 mm drug-eluting stent (postdilated up to 4.0 mm) with 0% residual stenosis and TIMI-3 flow.  Recommendations: Tirofiban infusion x 4 hours. Continue dual antiplatelet therapy with aspirin and prasugrel for at least 12 months.  Check CYP 2C19 genotype to assess clopidogrel response. Follow-up echocardiogram. Aggressive secondary prevention of coronary artery disease and escalation of evidence-based heart failure therapy as blood pressure allows. Nelva Bush, MD [facility]  ECHOCARDIOGRAM COMPLETE  Result Date: 03/14/2022    ECHOCARDIOGRAM REPORT   Patient Name:   SHEALA DOSH Date of Exam: 03/14/2022 Medical Rec #:  [id number]         Height:       61.0 in Accession #:    [id number]        Weight:       186.0 lb Date of Birth:  1975/05/27         BSA:          1.831 m Patient Age:    45 years          BP:           100/70 mmHg Patient Gender: F                 HR:           73 bpm. Exam Location:  [facility] Procedure: 2D Echo, Cardiac Doppler, Color Doppler and Intracardiac            Opacification Agent Indications:     NSTEMI I21.4  History:         Patient has prior history of Echocardiogram examinations, most                  recent 07/04/2021. CHF, Signs/Symptoms:Chest Pain; Risk                  Factors:Hypertension.  Sonographer:     Sherrie Sport Referring  Phys:  [id number] [place] Diagnosing Phys: Nelva Bush MD  Sonographer Comments: Suboptimal apical window and no subcostal window. IMPRESSIONS  1. Left ventricular ejection fraction, by estimation, is 35 to 40%. The left ventricle has moderately decreased  function. The left ventricle demonstrates regional wall motion abnormalities (see scoring diagram/findings for description). There is mild left ventricular hypertrophy. Left ventricular diastolic parameters are consistent with Grade I diastolic dysfunction (impaired relaxation). There is severe hypokinesis of the left ventricular, entire lateral wall and inferolateral wall.  2. Right ventricular systolic function is mildly reduced. The right ventricular size is normal.  3. The mitral valve is normal in structure. Trivial mitral valve regurgitation.  4. The aortic valve was not well visualized. Aortic valve regurgitation is not visualized. No aortic stenosis is present. FINDINGS  Left Ventricle: Left ventricular ejection fraction, by estimation, is 35 to 40%. The left ventricle has moderately decreased function. The left ventricle demonstrates regional wall motion abnormalities. Severe hypokinesis of the left ventricular, entire  lateral wall and inferolateral wall. Definity contrast agent was given IV to delineate the left ventricular endocardial borders. The left ventricular internal cavity size was normal in size. There is mild left ventricular hypertrophy. Left ventricular diastolic parameters are consistent with Grade I diastolic dysfunction (impaired relaxation). Right Ventricle: The right ventricular size is normal. No increase in right ventricular wall thickness. Right ventricular systolic function is mildly reduced. Left Atrium: Left atrial size was normal in size. Right Atrium: Right atrial size was normal in size. Pericardium: There is no evidence of pericardial effusion. Mitral Valve: The mitral valve is normal in structure. Trivial mitral valve  regurgitation. Tricuspid Valve: The tricuspid valve is not well visualized. Tricuspid valve regurgitation is trivial. Aortic Valve: The aortic valve was not well visualized. Aortic valve regurgitation is not visualized. No aortic stenosis is present. Aortic valve mean gradient measures 4.3 mmHg. Aortic valve peak gradient measures 6.8 mmHg. Aortic valve area, by VTI measures 1.80 cm. Pulmonic Valve: The pulmonic valve was not well visualized. Pulmonic valve regurgitation is not visualized. No evidence of pulmonic stenosis. Aorta: The aortic root is normal in size and structure. Pulmonary Artery: The pulmonary artery is of normal size. IAS/Shunts: The interatrial septum was not well visualized.  LEFT VENTRICLE PLAX 2D LVIDd:         4.90 cm   Diastology LVIDs:         3.40 cm   LV e' medial:    7.40 cm/s LV PW:         1.10 cm   LV E/e' medial:  9.2 LV IVS:        1.10 cm   LV e' lateral:   8.38 cm/s LVOT diam:     1.90 cm   LV E/e' lateral: 8.1 LV SV:         46 LV SV Index:   25 LVOT Area:     2.84 cm  RIGHT VENTRICLE RV S prime:     10.70 cm/s TAPSE (M-mode): 2.5 cm LEFT ATRIUM             Index        RIGHT ATRIUM           Index LA diam:        2.80 cm 1.53 cm/m   RA Area:     15.10 cm LA Vol (A2C):   32.6 ml 17.80 ml/m  RA Volume:   36.30 ml  19.82 ml/m LA Vol (A4C):   34.4 ml 18.78 ml/m LA Biplane Vol: 33.6 ml 18.35 ml/m  AORTIC VALVE AV Area (Vmax):    1.69 cm AV Area (Vmean):   1.53 cm AV Area (VTI):     1.80 cm AV Vmax:  130.00 cm/s AV Vmean:          94.700 cm/s AV VTI:            0.257 m AV Peak Grad:      6.8 mmHg AV Mean Grad:      4.3 mmHg LVOT Vmax:         77.40 cm/s LVOT Vmean:        51.000 cm/s LVOT VTI:          0.163 m LVOT/AV VTI ratio: 0.63  AORTA Ao Root diam: 3.27 cm MITRAL VALVE MV Area (PHT): 3.83 cm    SHUNTS MV Decel Time: 198 msec    Systemic VTI:  0.16 m MV E velocity: 68.10 cm/s  Systemic Diam: 1.90 cm MV A velocity: 92.20 cm/s MV E/A ratio:  0.74 Harrell Gave End  MD Electronically signed by Nelva Bush MD Signature Date/Time: 03/14/2022/3:40:34 PM    Final    DG Chest 2 View  Result Date: 03/13/2022 CLINICAL DATA:  Chest pain EXAM: CHEST - 2 VIEW COMPARISON:  10/25/2021 FINDINGS: The heart size and mediastinal contours are within normal limits. Both lungs are clear. The visualized skeletal structures are unremarkable. IMPRESSION: No active cardiopulmonary disease. Electronically Signed   By: Donavan Foil M.D.   On: 03/13/2022 17:40   US ARTERIAL LOWER EXTREMITY DUPLEX RIGHT(NON-ABI)  Result Date: 03/10/2022 CLINICAL DATA:  Right leg pain EXAM: RIGHT LOWER EXTREMITY ARTERIAL DUPLEX SCAN TECHNIQUE: Gray-scale sonography as well as color Doppler and duplex ultrasound was performed to evaluate the lower extremity arteries including the common, superficial and profunda femoral arteries, popliteal artery and calf arteries. COMPARISON:  None Available. FINDINGS: Right lower Extremity Inflow: Normal common femoral arterial waveforms and velocities. No evidence of inflow (aortoiliac) disease. Outflow: Normal profunda femoral, superficial femoral and popliteal arterial waveforms and velocities. No focal elevation of the PSV to suggest stenosis. Runoff: Normal posterior and anterior tibial arterial waveforms and velocities. Vessels are patent to the ankle. posterior and anterior tibial arterial waveforms and velocities. Vessels are patent to the ankle. IMPRESSION: Mild scattered atherosclerotic plaque without evidence of significant stenosis or occlusion. Signed, Criselda Peaches, MD, [facility] Electronically Signed   By: Jacqulynn Cadet M.D.   On: 03/10/2022 13:43   DG Shoulder Right  Result Date: 03/04/2022 CLINICAL DATA:  Right shoulder pain EXAM: RIGHT SHOULDER - 2+ VIEW COMPARISON:  None Available. FINDINGS: There is no evidence of fracture or dislocation. There is no evidence of arthropathy or  other focal bone abnormality. Soft tissues are unremarkable. IMPRESSION: Negative. Electronically Signed   By: Jacqulynn Cadet M.D.   On: 03/04/2022 07:28      Subjective: Seen and examined on day of discharge.  Stable no distress.  Chest pain-free.  Stable for discharge home.  Discharge Exam: Vitals:   03/15/22 0651 03/15/22 0733  BP: (!) 82/52 98/67  Pulse: 78 87  Resp:  14  Temp:  97.9 F (36.6 C)  SpO2:  98%   Vitals:   03/14/22 2331 03/15/22 0431 03/15/22 0651 03/15/22 0733  BP: (!) 81/54 (!) 81/62 (!) 82/52 98/67  Pulse: 88 83 78 87  Resp: _0 Temp: 98.3 F (36.8 C) 98 F (36.7 C)  97.9 F (36.6 C)  TempSrc:      SpO2: 99% 98%  98%  Weight:      Height:        General: Pt is alert, awake, not in  acute distress Cardiovascular: RRR, S1/S2 +, no rubs, no gallops Respiratory: CTA bilaterally, no wheezing, no rhonchi Abdominal: Soft, NT, ND, bowel sounds + Extremities: no edema, no cyanosis    The results of significant diagnostics from this hospitalization (including imaging, microbiology, ancillary and laboratory) are listed below for reference.     Microbiology: No results found for this or any previous visit (from the past 240 hour(s)).   Labs: BNP (last 3 results) Recent Labs    11/11/21 1502 12/22/21 1147 03/13/22 1849  BNP 34.8 70.5 15.3   Basic Metabolic Panel: Recent Labs  Lab 03/13/22 1719 03/13/22 1849 03/14/22 0148 03/14/22 0619 03/15/22 0504  NA 141  --  138 138 137  K 2.7*  --  4.3 4.2 4.0  CL 119*  --  113* 115* 110  CO2 17*  --  20* 20* 20*  GLUCOSE 94  --  106* 98 118*  BUN 6  --  _0 CREATININE 0.65  --  0.75 0.76 0.82  CALCIUM 6.7*  --  8.5* 8.7* 8.8*  MG  --  2.0  --   --   --    Liver Function Tests: No results for input(s): "AST", "ALT", "ALKPHOS", "BILITOT", "PROT", "ALBUMIN" in the last 168 hours. No results for input(s): "LIPASE", "AMYLASE" in the last 168 hours. No results for input(s): "AMMONIA" in the  last 168 hours. CBC: Recent Labs  Lab 03/13/22 1849 03/14/22 0619 03/15/22 0504  WBC 15.9* 8.9 17.5*  HGB 13.7 11.9* 12.3  HCT 40.2 34.7* 35.7*  MCV 90.3 91.6 92.0  PLT 309 264 306   Cardiac Enzymes: No results for input(s): "CKTOTAL", "CKMB", "CKMBINDEX", "TROPONINI" in the last 168 hours. BNP: Invalid input(s): "POCBNP" CBG: No results for input(s): "GLUCAP" in the last 168 hours. D-Dimer No results for input(s): "DDIMER" in the last 72 hours. Hgb A1c No results for input(s): "HGBA1C" in the last 72 hours. Lipid Profile Recent Labs    03/14/22 0148  CHOL 201*  HDL 28*  LDLCALC 137*  TRIG 180*  CHOLHDL 7.2   Thyroid function studies No results for input(s): "TSH", "T4TOTAL", "T3FREE", "THYROIDAB" in the last 72 hours.  Invalid input(s): "FREET3" Anemia work up No results for input(s): "VITAMINB12", "FOLATE", "FERRITIN", "TIBC", "IRON", "RETICCTPCT" in the last 72 hours. Urinalysis    Component Value Date/Time   COLORURINE COLORLESS (A) 03/21/2016 0937   APPEARANCEUR CLEAR (A) 03/21/2016 0937   LABSPEC 1.002 (L) 03/21/2016 0937   PHURINE 6.0 03/21/2016 0937   GLUCOSEU NEGATIVE 03/21/2016 0937   HGBUR NEGATIVE 03/21/2016 0937   BILIRUBINUR NEGATIVE 03/21/2016 [place] 03/21/2016 0937   PROTEINUR NEGATIVE 03/21/2016 0937   NITRITE NEGATIVE 03/21/2016 0937   LEUKOCYTESUR NEGATIVE 03/21/2016 0937   Sepsis Labs Recent Labs  Lab 03/13/22 1849 03/14/22 0619 03/15/22 0504  WBC 15.9* 8.9 17.5*   Microbiology No results found for this or any previous visit (from the past 240 hour(s)).   Time coordinating discharge: Over 30 minutes  SIGNED:   Sidney Ace, MD  [facility] 03/15/2022, 11:47 AM Pager   If 7PM-7AM, please contact night-coverage

## 2022-03-15 NOTE — Plan of Care (Signed)
  Problem: Education: Goal: Understanding of cardiac disease, CV risk reduction, and recovery process will improve Outcome: Progressing Goal: Individualized Educational Video(s) Outcome: Progressing   Problem: Activity: Goal: Ability to tolerate increased activity will improve Outcome: Progressing   Problem: Cardiac: Goal: Ability to achieve and maintain adequate cardiovascular perfusion will improve Outcome: Progressing

## 2022-03-16 LAB — LIPOPROTEIN A (LPA): Lipoprotein (a): 81.7 nmol/L — ABNORMAL HIGH (ref <75.0)

## 2022-03-17 ENCOUNTER — Telehealth: Payer: Self-pay | Admitting: Cardiovascular Disease

## 2022-03-17 NOTE — Telephone Encounter (Signed)
Spoke with patient and she reports shortness of breath after her previous heart attack and they had to change her meds from [place] to plavix. This heart attack they placed her on Effient and she didn't know if that had anything to do with it. Advised that I would review with provider and give her a call back. She verbalized understanding with no further questions at this time.

## 2022-03-17 NOTE — Telephone Encounter (Signed)
Pt c/o Shortness Of Breath: STAT if SOB developed within the last 24 hours or pt is noticeably SOB on the phone  1. Are you currently SOB (can you hear that pt is SOB on the phone)?  No   2. How long have you been experiencing SOB?  Started on Thurday after being discharged  3. Are you SOB when sitting or when up moving around?  When up and moving around  4. Are you currently experiencing any other symptoms?  No aside from a cough

## 2022-03-17 NOTE — Telephone Encounter (Signed)
Reviewed Dr. Tyrell Antonio input in regards to her shortness of breath being from her recent procedure and that is to be expected. She also had concerns about medication changes that could be causing her SOB but provider states this is not from that medication. He approved having her come in sooner to be seen. Scheduled her to come in next Friday and encouraged her to monitor blood pressures and heart rates to bring into that appointment. She verbalized understanding of our conversation, agreement with plan, and had no further questions at this time.

## 2022-03-17 NOTE — Telephone Encounter (Signed)
Pamella states triage will review ED notes and give the patient a call back. informed patient.

## 2022-03-17 NOTE — Telephone Encounter (Signed)
Reviewed with Dr. Fletcher Anon here in the office. He states that it is not related to the medication and that this is to be expected after having a heart cath. Inquired if I could move her appointment up to next week and he was agreeable. Will reach back out to patient to review Dr. Tyrell Antonio review.

## 2022-03-20 ENCOUNTER — Telehealth: Payer: Self-pay | Admitting: Cardiovascular Disease

## 2022-03-20 NOTE — Telephone Encounter (Signed)
Pt calling stating since she left the hospital she has had trouble urinating. I could not hear pt anymore after she said that, wasn't sure if she could hear me or not but told her to call back if any other concerns.

## 2022-03-20 NOTE — Telephone Encounter (Signed)
Spoke w/ pt.  She reports that she is constipated, took a stool softener w/ no results. Advised her to try to increase her fiber and water for now (she is on fluid pill) to get things moving.  Advised her that she may need to try a laxative if she is having no relief. She states that she has some of the chocolate ex-lax that she will try. Recommended an enema if those don't work. She will call back if her sx don't improve. She is appreciative of the call.

## 2022-03-20 NOTE — Progress Notes (Signed)
Cardiology Clinic Note   Patient Name: Lisa Davila Date of Encounter: 03/24/2022  Primary Care Provider:  Tracie Harrier, MD Primary Cardiologist:  Kathlyn Sacramento, MD  Patient Profile    46 year old female with a past medical history of coronary artery disease with an inferior STEMI in 06/2021 status post PCI/DES to the RCA, HFrEF secondary to ICM, SVT status post EP study without inducible arrhythmia, hypertension, hyperlipidemia, fibromyalgia, anxiety, and tobacco abuse, who is here today for hospital follow-up status post NSTEMI.  Past Medical History    Past Medical History:  Diagnosis Date   Acute ethmoidal sinusitis    Acute ethmoidal sinusitis    Acute maxillary sinusitis    Acute upper respiratory infections of unspecified site    Anxiety state, unspecified    Chest pain, unspecified    CHF (congestive heart failure) ([place])    Coronary artery disease    Depression    Diarrhea    Esophageal reflux    Generalized anxiety disorder    Hyperlipidemia    Hypertension    Hypoglycemia, unspecified    Lumbago    Nausea with vomiting    Obstructive chronic bronchitis with exacerbation ([place])    Other and unspecified hyperlipidemia    Other and unspecified peripheral vertigo(386.19)    Other bursitis disorders    Other malaise and fatigue    Other specified diseases due to viruses    Other vitamin B12 deficiency anemia    Pain in limb    Pernicious anemia    Sleep related hypoventilation/hypoxemia in conditions classifiable elsewhere    SVT (supraventricular tachycardia)    Swelling, mass, or lump in head and neck    Tobacco use disorder    Unspecified disorder of external ear    Unspecified disorder of skin and subcutaneous tissue    Unspecified otitis media    Past Surgical History:  Procedure Laterality Date   CESAREAN SECTION     CORONARY STENT INTERVENTION N/A 03/14/2022   Procedure: CORONARY STENT INTERVENTION;  Surgeon: Nelva Bush, MD;   Location: [place] [facility];  Service: Cardiovascular;  Laterality: N/A;   CORONARY THROMBECTOMY N/A 03/14/2022   Procedure: Coronary Thrombectomy;  Surgeon: Nelva Bush, MD;  Location: [place] [facility];  Service: Cardiovascular;  Laterality: N/A;   CORONARY/GRAFT ACUTE MI REVASCULARIZATION N/A 07/04/2021   Procedure: Coronary/Graft Acute MI Revascularization;  Surgeon: Wellington Hampshire, MD;  Location: [place] [facility];  Service: Cardiovascular;  Laterality: N/A;   LEFT HEART CATH AND CORONARY ANGIOGRAPHY N/A 07/04/2021   Procedure: LEFT HEART CATH AND CORONARY ANGIOGRAPHY;  Surgeon: Wellington Hampshire, MD;  Location: [place] [facility];  Service: Cardiovascular;  Laterality: N/A;   LEFT HEART CATH AND CORONARY ANGIOGRAPHY N/A 03/14/2022   Procedure: LEFT HEART CATH AND CORONARY ANGIOGRAPHY;  Surgeon: Nelva Bush, MD;  Location: [place] [facility];  Service: Cardiovascular;  Laterality: N/A;   RIGHT/LEFT HEART CATH AND CORONARY ANGIOGRAPHY N/A 11/07/2021   Procedure: RIGHT/LEFT HEART CATH AND CORONARY ANGIOGRAPHY;  Surgeon: Wellington Hampshire, MD;  Location: [place] [facility];  Service: Cardiovascular;  Laterality: N/A;    Allergies  Allergies  Allergen Reactions   Buspirone     Other reaction(s): Other (See Comments), Other (See Comments) Gi UPSET Gi UPSET Gi UPSET Other reaction(s): Other (See Comments) Gi UPSET Gi UPSET    Citalopram Nausea Only   Metrizamide Hives   Sulfa Antibiotics Hives   Contrast Media [Iodinated Contrast Media] Hives  Sulfa Drugs Cross Reactors    Sulfasalazine Other (See Comments)    History of Present Illness    Lisa Davila is a 46 year old female with a history of coronary artery disease with an inferior STEMI in 06/2021 status post PCI/DES to the RCA, HFrEF secondary to ischemic cardiomyopathy, SVT status post EP study without inducible arrhythmia, hypertension, hyperlipidemia, fibromyalgia, anxiety, and recent smoking  cessation, who was recently admitted with chest pain and palpitations associated with elevated troponin consistent with NSTEMI.  She originally presented to the [facility] emergency department February 2020 with chest pain was found to be inferior ST elevated myocardial infarction.  Emergent cardiac catheterization was performed which showed thrombotic subtotal occlusion of the proximal right coronary artery which was treated successfully with PCI/DES.  There was mild nonobstructive disease affecting the left coronary arteries.  Echocardiogram revealed EF of 50-55%.  Brilinta was changed to clopidogrel due to persistent dyspnea.  Unfortunately she had COVID-19 infection in May 2023.  Have recurrent chest pain after that with multiple emergency department visits.  Repeat echocardiogram in April showed EF of 35%.  Due to the drop in her ejection fraction and her symptoms she underwent an elective right and left heart catheterization with Dr. Mariea Clonts.  The RCA stent was patent with mild in-stent restenosis with no evidence of obstructive disease affecting the left coronary arteries.  Ejection fraction again was noted to be 35%.  Right heart catheterization showed mildly to moderately elevated filling pressures, minimal pulmonary hypertension and normal cardiac output.  She presented again to the [facility] emergency department on 03/13/2022 with palpitations throughout the day.  She had woken up from a nap with persistent palpitations.  She was without chest pain or dyspnea at the time.  She took 4 baby aspirin at home.  Due to palpitations feeling similar to what she experienced at the time of her STEMI she can contacted EMS.  While in route to the hospital with EMS she developed chest pain, dyspnea, diaphoresis.  In the emergency department blood pressure was soft at times with systolic pressures in the 70s.  EKG showed normal sinus rhythm at 85 bpm prior inferior infarct nonspecific ST-T wave changes high-sensitivity troponin  was 936 with a delta troponin of 2036 and trended to 5742.  Potassium was 2.7 repleted to 4.2.  On 03/14/2022 she had repeat echocardiogram which revealed LVEF 35-40%, left trickle with mildly decreased function, demonstrated regional wall motion abnormalities, mild left ventricular hypertrophy, G1 DD, with trivial mitral regurgitation.  Left heart catheterization on 03/14/2022 revealed severe single-vessel CAD with occlusion of the proximal/mid RCA with late stent thrombosis with heavy thrombus burden, nonobstructive coronary artery disease observed in the left coronary artery similar to prior cath from 11/2021, mildly elevated left ventricular filling pressure with an LVEDP of 20 mmHg, successful mechanical thrombectomy and drug-eluting stent placement to the proximal/mid RCA with 0% residual stenosis and TIMI-3 flow.  Is recommended to continue on dual antiplatelet therapy with aspirin and prasugrel for at least 12 months also to check CYP 2C19 genotype to assess clopidogrel response, follow-up echocardiogram, and aggressive secondary prevention of coronary artery disease and escalation of evidence-based heart failure therapy as blood pressure allows.  She was noted to be hypotensive during the hospitalization and her Toprol-XL dosing was decreased from 50 mg to 25 mg daily and her [place]ade was discontinued, and her furosemide was decreased from 40 mg daily to 20 mg daily.  She was then considered stable and subsequently discharged from the facility  on 03/15/2022.  She returns to clinic today stating that she has been doing fairly well.  She has denied any chest discomfort but has noted some exertional dyspnea.  She states that it is unchanged.  She is concerned as she has noted at home that she has been tachycardic with metoprolol dosing being decreased.  She stated that they did discuss her starting cardiac rehab.  And that since discharge from the hospital she has stopped smoking.   Home Medications     Current Outpatient Medications  Medication Sig Dispense Refill   albuterol (VENTOLIN HFA) 108 (90 Base) MCG/ACT inhaler Inhale 2 puffs into the lungs every 6 hours as needed for wheezing. 18 g 5   ALPRAZolam (XANAX) 0.5 MG tablet Take 1 tablet (0.5 mg total) by mouth 3 (three) times daily as needed for Sleep 90 tablet 2   aspirin 81 MG chewable tablet Chew 1 tablet (81 mg total) by mouth daily.     cyanocobalamin (VITAMIN B12) 1000 MCG tablet Take 1 tablet (1,000 mcg total) by mouth once daily 90 tablet 1   ergocalciferol (VITAMIN D2) 1.25 MG (50000 UT) capsule Take one capsule by mouth once a week 12 capsule 1   ipratropium-albuterol (DUONEB) 0.5-2.5 (3) MG/3ML SOLN Take 3 mls by nebulization 4 (four) times daily for 30 days 360 mL 1   nicotine (NICODERM CQ - DOSED IN MG/24 HOURS) 14 mg/24hr patch Place 1 patch onto the skin daily for 14 days 28 patch 0   nitroGLYCERIN (NITROSTAT) 0.4 MG SL tablet Place 1 tablet (0.4 mg total) under the tongue every 5 (five) minutes as needed for chest pain. 50 tablet 0   Potassium Chloride ER 20 MEQ TBCR Take 20 mEq by mouth daily. 30 tablet 0   prasugrel (EFFIENT) 10 MG TABS tablet Take 1 tablet (10 mg total) by mouth daily. 30 tablet 0   rosuvastatin (CRESTOR) 40 MG tablet Take 1 tablet (40 mg total) by mouth daily. 30 tablet 0   dapagliflozin propanediol (FARXIGA) 10 MG TABS tablet Take 1 tablet (10 mg total) by mouth daily before breakfast. (Patient not taking: Reported on 03/24/2022) 30 tablet 3   DULoxetine (CYMBALTA) 30 MG capsule Take 1 capsule (30 mg total) by mouth once daily (Patient not taking: Reported on 03/24/2022) 90 capsule 1   furosemide (LASIX) 20 MG tablet Take 2 tablets (40 mg total) by mouth daily. 180 tablet 3   medroxyPROGESTERone (DEPO-PROVERA) 150 MG/ML injection Inject into the muscle. (Patient not taking: Reported on 03/24/2022)     metoprolol succinate (TOPROL-XL) 25 MG 24 hr tablet Take 2 tablets (50 mg total) by mouth daily. 180  tablet 3   No current facility-administered medications for this visit.     Family History    Family History  Problem Relation Age of Onset   Depression Mother    Cancer Father        Father   Hypertension Sister    Breast cancer Maternal Grandmother    Cancer Paternal Grandfather        Breast    She indicated that her mother is alive. She indicated that her father is deceased. She indicated that the status of her sister is unknown. She indicated that the status of her maternal grandmother is unknown. She indicated that the status of her paternal grandfather is unknown.  Social History    Social History   Socioeconomic History   Marital status: Single    Spouse name: Not on file  Number of children: Not on file   Years of education: Not on file   Highest education level: Not on file  Occupational History   Not on file  Tobacco Use   Smoking status: Every Day    Years: 30.00    Types: Cigarettes   Smokeless tobacco: Never   Tobacco comments:    Has been smoking for 30 years Wants to quit after she gets past all the heart stuff and changes in life.  Smoking 3 a day right now  Vaping Use   Vaping Use: Never used  Substance and Sexual Activity   Alcohol use: No   Drug use: Never   Sexual activity: Not Currently  Other Topics Concern   Not on file  Social History Narrative   Lives with fiance, Juluis Rainier.   Social Determinants of Health   Financial Resource Strain: Not on file  Food Insecurity: No Food Insecurity (03/14/2022)   Hunger Vital Sign    Worried About Running Out of Food in the Last Year: Never true    Ran Out of Food in the Last Year: Never true  Transportation Needs: No Transportation Needs (03/14/2022)   PRAPARE - Hydrologist (Medical): No    Lack of Transportation (Non-Medical): No  Physical Activity: Not on file  Stress: Not on file  Social Connections: Not on file  Intimate Partner Violence: Not At Risk (03/14/2022)    Humiliation, Afraid, Rape, and Kick questionnaire    Fear of Current or Ex-Partner: No    Emotionally Abused: No    Physically Abused: No    Sexually Abused: No     Review of Systems    General:  No chills, fever, night sweats or weight changes.  Endorses fatigue Cardiovascular:  No chest pain, endorses dyspnea on exertion, edema, orthopnea, palpitations, paroxysmal nocturnal dyspnea. Dermatological: No rash, lesions/masses Respiratory: No cough, endorses dyspnea Urologic: No hematuria, dysuria Abdominal:   No nausea, vomiting, diarrhea, bright red blood per rectum, melena, or hematemesis Neurologic:  No visual changes, wkns, changes in mental status. All other systems reviewed and are otherwise negative except as noted above.    Cardiac Rehabilitation Eligibility Assessment      Physical Exam    VS:  BP 100/68   Pulse (!) 106   Ht '5\' 1"'$  (1.549 m)   Wt 179 lb (81.2 kg)   SpO2 96%   BMI 33.82 kg/m  , BMI Body mass index is 33.82 kg/m.     GEN: Well nourished, well developed, in no acute distress. HEENT: normal. Neck: Supple, no JVD, carotid bruits, or masses. Cardiac: RRR, tachycardic no murmurs, rubs, or gallops. No clubbing, cyanosis, trace pretibial edema.  Radials/DP/PT 2+ and equal bilaterally.  Left radial cath site with 2+ radial pulse without pain, discomfort, bleeding, or hematoma. Respiratory:  Respirations regular and unlabored, clear to auscultation bilaterally. GI: Soft, nontender, mildly distended, BS + x 4. MS: no deformity or atrophy. Skin: warm and dry, no rash. Neuro:  Strength and sensation are intact. Psych: Normal affect.  Accessory Clinical Findings    ECG personally reviewed by me today-sinus tachycardia with a rate of 106, left axis deviation- No acute changes  Lab Results  Component Value Date   WBC 17.5 (H) 03/15/2022   HGB 12.3 03/15/2022   HCT 35.7 (L) 03/15/2022   MCV 92.0 03/15/2022   PLT 306 03/15/2022   Lab Results  Component  Value Date   CREATININE 0.82 03/15/2022  BUN 9 03/15/2022   NA 137 03/15/2022   K 4.0 03/15/2022   CL 110 03/15/2022   CO2 20 (L) 03/15/2022   Lab Results  Component Value Date   ALT 19 09/05/2021   AST 18 09/05/2021   ALKPHOS 85 09/05/2021   BILITOT 0.6 09/05/2021   Lab Results  Component Value Date   CHOL 201 (H) 03/14/2022   HDL 28 (L) 03/14/2022   LDLCALC 137 (H) 03/14/2022   TRIG 180 (H) 03/14/2022   CHOLHDL 7.2 03/14/2022    Lab Results  Component Value Date   HGBA1C 5.3 07/04/2021    Assessment & Plan   1.  Coronary artery disease involving native coronary arteries with other forms of angina.  She was recently hospitalized with NSTEMI and had successful PCI/DES of the proximal/mid RCA.  Left radial cath site is without issues.  She has been referred to cardiac rehab.  She is also been congratulated on her stopping smoking.  She has been continued on aspirin 81 mg daily, prasugrel 10 mg daily, rosuvastatin 40 mg daily, and sublingual nitro as needed.  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  So she was given a note to return to work today on 04/03/2022 without restriction.  2.  Chronic systolic heart failure secondary to ischemic cardiomyopathy with a last LVEF of 35-40%.  She complains of dyspnea and abdominal bloating today.  During her hospitalization her blood pressures were soft which limited the escalation of GDMT.  With her continued symptoms we will increase her Lasix back to 40 mg daily with a BMP in 2 weeks.  Metoprolol she will take 25 mg twice daily.  So we will see how she does with changes in medications as far as her blood pressure prior to escalating any further of GDMT.  She is also been advised to continue to see Darylene Price in heart failure clinic.  She also need repeat echocardiogram limited study in approximately 3 months to reevaluate her LVEF.  She has  been encouraged to watch her sodium intake, daily weights, and her fluid intake.  3.  History of paroxysmal SVT with recent Zio patch that revealed no significant arrhythmias and sinus tachycardia on EKG today.  Toprol-XL was increased from 25 mg daily to 25 mg twice daily.  With her having soft blood pressure during her hospitalization she was advised to take it twice daily to help offset pressure changes.  She has been encouraged to continue to monitor heart rate at home with her pulse ox.  4.  Hyperlipidemia she is continued on rosuvastatin 40 mg daily and will need repeat lipid and hepatic panel in 8 to 10 weeks.  5.  Hypokalemia.  On arrival to the hospital she was noted to have potassium of 2.7 that had to be repleted and was 4.0 On discharge.  She is continued on potassium supplements daily and a BMP in 2 weeks with changing of her furosemide.  6.  History of tobacco use.  Patient states that she is no longer smoking.  She has nicotine patches if needed.  7.  Patient is return to clinic to see MD/APP in 4 weeks or sooner if needed.  She is also encouraged to continue with follow-up with heart failure clinic.  Luisfernando Brightwell, NP 03/24/2022, 5:03 PM

## 2022-03-21 ENCOUNTER — Other Ambulatory Visit: Payer: Self-pay

## 2022-03-24 ENCOUNTER — Ambulatory Visit: Payer: 59 | Attending: Cardiology | Admitting: Cardiology

## 2022-03-24 ENCOUNTER — Encounter: Payer: Self-pay | Admitting: *Deleted

## 2022-03-24 ENCOUNTER — Encounter: Payer: Self-pay | Admitting: Cardiology

## 2022-03-24 ENCOUNTER — Other Ambulatory Visit: Payer: Self-pay

## 2022-03-24 VITALS — BP 100/68 | HR 106 | Ht 61.0 in | Wt 179.0 lb

## 2022-03-24 DIAGNOSIS — E876 Hypokalemia: Secondary | ICD-10-CM

## 2022-03-24 DIAGNOSIS — I25118 Atherosclerotic heart disease of native coronary artery with other forms of angina pectoris: Secondary | ICD-10-CM | POA: Diagnosis not present

## 2022-03-24 DIAGNOSIS — R002 Palpitations: Secondary | ICD-10-CM

## 2022-03-24 DIAGNOSIS — I5022 Chronic systolic (congestive) heart failure: Secondary | ICD-10-CM | POA: Diagnosis not present

## 2022-03-24 DIAGNOSIS — R0602 Shortness of breath: Secondary | ICD-10-CM | POA: Diagnosis not present

## 2022-03-24 DIAGNOSIS — I471 Supraventricular tachycardia, unspecified: Secondary | ICD-10-CM | POA: Diagnosis not present

## 2022-03-24 MED ORDER — METOPROLOL SUCCINATE ER 25 MG PO TB24
50.0000 mg | ORAL_TABLET | Freq: Every day | ORAL | 3 refills | Status: DC
  Filled 2022-03-24: qty 60, 30d supply, fill #0

## 2022-03-24 MED ORDER — FUROSEMIDE 20 MG PO TABS
40.0000 mg | ORAL_TABLET | Freq: Every day | ORAL | 3 refills | Status: DC
  Filled 2022-03-24: qty 60, 30d supply, fill #0

## 2022-03-24 NOTE — Patient Instructions (Signed)
Medication Instructions:  Your physician has recommended you make the following change in your medication:   INCREASE Toprol XL (metoprolol succinate) to 50 mg once daily INCREASE furosemide (Lasix) to 40 mg once daily  *If you need a refill on your cardiac medications before your next appointment, please call your pharmacy*   Lab Work: BMP in 2 weeks. No appointment is needed. Go to the following location:  [facility] Entrance at [facility] 1st desk on the right to check in (REGISTRATION)  Lab hours: Monday- Friday (7:30 am- 5:30 pm)  If you have labs (blood work) drawn today and your tests are completely normal, you will receive your results only by: [place] (if you have MyChart) OR A paper copy in the mail If you have any lab test that is abnormal or we need to change your treatment, we will call you to review the results.   Testing/Procedures: None   Follow-Up: At [facility], you and your health needs are our priority.  As part of our continuing mission to provide you with exceptional heart care, we have created designated Provider Care Teams.  These Care Teams include your primary Cardiologist (physician) and Advanced Practice Providers (APPs -  Physician Assistants and Nurse Practitioners) who all work together to provide you with the care you need, when you need it.   Your next appointment:   4 week(s)  The format for your next appointment:   In Person  Provider:   Kathlyn Sacramento, MD or Gerrie Nordmann, NP    Other Instructions Order has been entered for your cardiac rehab. It can take a couple of weeks before you hear from them. Their number is [phone]  Important Information About Sugar

## 2022-04-03 ENCOUNTER — Other Ambulatory Visit: Payer: Self-pay

## 2022-04-03 MED ORDER — AMOXICILLIN-POT CLAVULANATE 875-125 MG PO TABS
ORAL_TABLET | ORAL | 0 refills | Status: DC
  Filled 2022-04-03: qty 20, 10d supply, fill #0

## 2022-04-03 MED ORDER — NYSTATIN 100000 UNIT/ML MT SUSP
OROMUCOSAL | 0 refills | Status: DC
  Filled 2022-04-03: qty 125, 7d supply, fill #0
  Filled 2022-04-03: qty 75, 3d supply, fill #0

## 2022-04-03 MED ORDER — HYDROCOD POLI-CHLORPHE POLI ER 10-8 MG/5ML PO SUER
ORAL | 0 refills | Status: DC
  Filled 2022-04-03: qty 70, 14d supply, fill #0

## 2022-04-03 MED ORDER — PREDNISONE 20 MG PO TABS
ORAL_TABLET | ORAL | 0 refills | Status: DC
  Filled 2022-04-03: qty 5, 5d supply, fill #0

## 2022-04-04 ENCOUNTER — Other Ambulatory Visit: Payer: Self-pay

## 2022-04-04 ENCOUNTER — Encounter: Payer: Self-pay | Admitting: *Deleted

## 2022-04-04 LAB — CYTOCHROME P450 2C19

## 2022-04-05 ENCOUNTER — Ambulatory Visit: Payer: 59 | Admitting: Cardiology

## 2022-04-12 ENCOUNTER — Other Ambulatory Visit: Payer: Self-pay

## 2022-04-12 ENCOUNTER — Other Ambulatory Visit: Payer: Self-pay | Admitting: Internal Medicine

## 2022-04-12 ENCOUNTER — Other Ambulatory Visit
Admission: RE | Admit: 2022-04-12 | Discharge: 2022-04-12 | Disposition: A | Discharge status: Home / Self Care | Payer: 59 | Attending: Cardiology | Admitting: Cardiology

## 2022-04-12 ENCOUNTER — Telehealth: Payer: Self-pay | Admitting: Cardiology

## 2022-04-12 DIAGNOSIS — I471 Supraventricular tachycardia, unspecified: Secondary | ICD-10-CM

## 2022-04-12 DIAGNOSIS — Z79899 Other long term (current) drug therapy: Secondary | ICD-10-CM

## 2022-04-12 DIAGNOSIS — I5022 Chronic systolic (congestive) heart failure: Secondary | ICD-10-CM

## 2022-04-12 DIAGNOSIS — R002 Palpitations: Secondary | ICD-10-CM | POA: Diagnosis not present

## 2022-04-12 DIAGNOSIS — I25118 Atherosclerotic heart disease of native coronary artery with other forms of angina pectoris: Secondary | ICD-10-CM

## 2022-04-12 DIAGNOSIS — E876 Hypokalemia: Secondary | ICD-10-CM | POA: Diagnosis not present

## 2022-04-12 DIAGNOSIS — R0602 Shortness of breath: Secondary | ICD-10-CM

## 2022-04-12 LAB — BASIC METABOLIC PANEL
Anion gap: 6 (ref 5–15)
BUN: 5 mg/dL — ABNORMAL LOW (ref 6–20)
CO2: 26 mmol/L (ref 22–32)
Calcium: 9 mg/dL (ref 8.9–10.3)
Chloride: 109 mmol/L (ref 98–111)
Creatinine, Ser: 0.78 mg/dL (ref 0.44–1.00)
GFR, Estimated: >60 mL/min (ref >60)
Glucose, Bld: 92 mg/dL (ref 70–99)
Potassium: 2.7 mmol/L — CL (ref 3.5–5.1)
Sodium: 141 mmol/L (ref 135–145)

## 2022-04-12 MED ORDER — SACCHAROMYCES BOULARDII 250 MG PO CAPS: ORAL_CAPSULE | ORAL | 1 refills | Status: DC

## 2022-04-12 MED ORDER — PREDNISONE 10 MG PO TABS
ORAL_TABLET | ORAL | 0 refills | Status: DC
  Filled 2022-04-12: qty 20, 8d supply, fill #0

## 2022-04-12 MED ORDER — FLUCONAZOLE 100 MG PO TABS
100.0000 mg | ORAL_TABLET | Freq: Every day | ORAL | 0 refills | Status: DC
  Filled 2022-04-12: qty 3, 3d supply, fill #0

## 2022-04-12 MED ORDER — HYDROCOD POLI-CHLORPHE POLI ER 10-8 MG/5ML PO SUER
ORAL | 0 refills | Status: DC
  Filled 2022-04-14: qty 70, 7d supply, fill #0

## 2022-04-12 MED ORDER — POTASSIUM CHLORIDE CRYS ER 20 MEQ PO TBCR
40.0000 meq | EXTENDED_RELEASE_TABLET | Freq: Every day | ORAL | 3 refills | Status: DC
  Filled 2022-04-12: qty 60, 30d supply, fill #0
  Filled 2022-05-15: qty 60, 30d supply, fill #1
  Filled 2022-06-12: qty 60, 30d supply, fill #2
  Filled 2022-10-10: qty 60, 30d supply, fill #3

## 2022-04-12 MED ORDER — AZITHROMYCIN 250 MG PO TABS
ORAL_TABLET | ORAL | 0 refills | Status: DC
  Filled 2022-04-12: qty 6, 5d supply, fill #0

## 2022-04-12 MED FILL — Prasugrel HCl Tab 10 MG (Base Equiv): ORAL | 30 days supply | Qty: 30 | Fill #0 | Status: AC

## 2022-04-12 MED FILL — Rosuvastatin Calcium Tab 40 MG: ORAL | 30 days supply | Qty: 30 | Fill #0 | Status: AC

## 2022-04-12 NOTE — Telephone Encounter (Signed)
Received call from [facility] for critical potassium 2.7. Gerrie Nordmann, NP made aware and recommended pt take Potassium 60 meq tonight and then 40 meq thereafter. BMP in 1 week Pt made aware and verbalized understanding.

## 2022-04-12 NOTE — Telephone Encounter (Signed)
  [facility] lab calling to give critical result

## 2022-04-13 ENCOUNTER — Other Ambulatory Visit: Payer: Self-pay

## 2022-04-14 ENCOUNTER — Other Ambulatory Visit: Payer: Self-pay

## 2022-04-17 ENCOUNTER — Other Ambulatory Visit: Payer: Self-pay

## 2022-04-18 ENCOUNTER — Other Ambulatory Visit: Payer: Self-pay

## 2022-04-18 MED ORDER — FLUCONAZOLE 150 MG PO TABS
ORAL_TABLET | ORAL | 0 refills | Status: DC
  Filled 2022-04-18: qty 14, 14d supply, fill #0

## 2022-04-18 MED ORDER — NYSTATIN 100000 UNIT/ML MT SUSP
OROMUCOSAL | 1 refills | Status: DC
  Filled 2022-04-18: qty 200, 10d supply, fill #0

## 2022-04-19 ENCOUNTER — Other Ambulatory Visit (HOSPITAL_BASED_OUTPATIENT_CLINIC_OR_DEPARTMENT_OTHER): Payer: Self-pay

## 2022-04-25 ENCOUNTER — Other Ambulatory Visit: Payer: Self-pay

## 2022-04-25 ENCOUNTER — Ambulatory Visit: Payer: 59 | Attending: Cardiovascular Disease | Admitting: Cardiovascular Disease

## 2022-04-25 ENCOUNTER — Encounter: Payer: Self-pay | Admitting: Cardiovascular Disease

## 2022-04-25 VITALS — BP 110/76 | HR 110 | Ht 61.0 in | Wt 176.4 lb

## 2022-04-25 DIAGNOSIS — Z720 Tobacco use: Secondary | ICD-10-CM | POA: Diagnosis not present

## 2022-04-25 DIAGNOSIS — E785 Hyperlipidemia, unspecified: Secondary | ICD-10-CM

## 2022-04-25 DIAGNOSIS — I5022 Chronic systolic (congestive) heart failure: Secondary | ICD-10-CM

## 2022-04-25 DIAGNOSIS — I2510 Atherosclerotic heart disease of native coronary artery without angina pectoris: Secondary | ICD-10-CM | POA: Diagnosis not present

## 2022-04-25 MED ORDER — DAPAGLIFLOZIN PROPANEDIOL 10 MG PO TABS
10.0000 mg | ORAL_TABLET | Freq: Every day | ORAL | 3 refills | Status: DC
  Filled 2022-04-25 – 2022-05-15 (×2): qty 30, 30d supply, fill #0

## 2022-04-25 MED ORDER — METOPROLOL SUCCINATE ER 50 MG PO TB24
50.0000 mg | ORAL_TABLET | Freq: Every day | ORAL | 3 refills | Status: DC
  Filled 2022-04-25: qty 30, 30d supply, fill #0
  Filled 2022-05-25: qty 30, 30d supply, fill #1

## 2022-04-25 MED ORDER — FUROSEMIDE 20 MG PO TABS
20.0000 mg | ORAL_TABLET | Freq: Every day | ORAL | 3 refills | Status: DC
  Filled 2022-04-25: qty 30, 30d supply, fill #0
  Filled 2022-06-12: qty 30, 30d supply, fill #1

## 2022-04-25 MED ORDER — SPIRONOLACTONE 25 MG PO TABS
12.5000 mg | ORAL_TABLET | Freq: Every day | ORAL | 3 refills | Status: DC
  Filled 2022-04-25: qty 15, 30d supply, fill #0
  Filled 2022-06-12: qty 15, 30d supply, fill #1
  Filled 2022-09-12: qty 15, 30d supply, fill #2
  Filled 2022-10-10: qty 15, 30d supply, fill #3

## 2022-04-25 NOTE — Patient Instructions (Signed)
Medication Instructions:  Your physician recommends the following medication changes.  START TAKING: Farxiga 10 mg once daily Spironolactone 12.5 mg (half a tablet)  DECREASE: Furosemide to 20 mg once daily  *If you need a refill on your cardiac medications before your next appointment, please call your pharmacy*   Lab Work: Your provider would like for you to return in one week to have the following labs drawn: BMET.   Please go to the [facility] entrance and check in at the front desk.  You do not need an appointment.  They are open from 7am-6 pm.  You do not need to be fasting.  If you have labs (blood work) drawn today and your tests are completely normal, you will receive your results only by: [place] (if you have MyChart) OR A paper copy in the mail If you have any lab test that is abnormal or we need to change your treatment, we will call you to review the results.   Testing/Procedures: None ordered   Follow-Up: At [facility], you and your health needs are our priority.  As part of our continuing mission to provide you with exceptional heart care, we have created designated Provider Care Teams.  These Care Teams include your primary Cardiologist (physician) and Advanced Practice Providers (APPs -  Physician Assistants and Nurse Practitioners) who all work together to provide you with the care you need, when you need it.  We recommend signing up for the patient portal called "MyChart".  Sign up information is provided on this After Visit Summary.  MyChart is used to connect with patients for Virtual Visits (Telemedicine).  Patients are able to view lab/test results, encounter notes, upcoming appointments, etc.  Non-urgent messages can be sent to your provider as well.   To learn more about what you can do with MyChart, go to [email or web address].    Your next appointment:   2 month(s)  The format for your next appointment:   In  Person  Provider:   You may see Kathlyn Sacramento, MD or one of the following Advanced Practice Providers on your designated Care Team:   Murray Hodgkins, NP Christell Faith, PA-C Cadence Kathlen Mody, PA-C Gerrie Nordmann, NP

## 2022-04-25 NOTE — Progress Notes (Signed)
Cardiology Office Note   Date:  04/25/2022   ID:  Lisa Davila, DOB [birth year], MRN [id number]  PCP:  Tracie Harrier, MD  Cardiologist:   Kathlyn Sacramento, MD   Chief Complaint  Patient presents with   Follow-up    4 week f/u, concerns about new medication      History of Present Illness: Lisa Davila is a 46 y.o. female who is here today for a follow-up visit regarding coronary artery disease and chronic systolic heart failure.  She has known history of SVT status post previous EP study with no inducible arrhythmia, essential hypertension, hyperlipidemia, fibromyalgia, tobacco use, anxiety and hypokalemia. She presented in February 2023 with chest pain and was found to have inferior ST elevation.  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  There was mild nonobstructive disease affecting the left coronary arteries.  Echocardiogram showed an EF of 50 to 55%.  Brilinta was switched to clopidogrel due to persistent dyspnea. She had COVID-19 infection in May.  She had recurrent chest pain after that we will multiple emergency room visits. Repeat echocardiogram in April showed an EF of 35%.  Due to a drop in her ejection fraction and her symptoms, right and left cardiac catheterization was done in July which showed patent RCA stent with mild in-stent restenosis and no evidence of obstructive disease affecting the left coronary arteries.  Ejection fraction was 35%.  Right heart catheterization showed mildly to moderately elevated filling pressures, minimal pulmonary hypertension and normal cardiac output.   She presented in November with recurrent myocardial infarction.  Cardiac catheterization showed occluded RCA stent due to late stent thrombosis.  She underwent successful mechanical thrombectomy and drug eluting stent  placement.  She was switched from clopidogrel to prasugrel.  Genotype testing subsequently showed intermediate 0N-47 metabolic activity. Echocardiogram showed an EF of 35 to 40%.  She quit smoking since her second myocardial infarction.  She has been doing reasonably well with no recurrent chest pain.  Fortunately, she has been dealing with bronchitis and hoarseness.  She currently works at [employer] as a Psychologist, sport and exercise.    Past Medical History:  Diagnosis Date   Acute ethmoidal sinusitis    Acute ethmoidal sinusitis    Acute maxillary sinusitis    Acute upper respiratory infections of unspecified site    Anxiety state, unspecified    Chest pain, unspecified    CHF (congestive heart failure) ([place])    Coronary artery disease    Depression    Diarrhea    Esophageal reflux    Generalized anxiety disorder    Hyperlipidemia    Hypertension    Hypoglycemia, unspecified    Lumbago    Nausea with vomiting    Obstructive chronic bronchitis with exacerbation (HCC)    Other and unspecified hyperlipidemia    Other and unspecified peripheral vertigo(386.19)    Other bursitis disorders    Other malaise and fatigue    Other specified diseases due to viruses    Other vitamin B12 deficiency anemia    Pain in limb    Pernicious anemia    Sleep related hypoventilation/hypoxemia in conditions classifiable elsewhere    SVT (supraventricular tachycardia)    Swelling, mass, or lump in head and neck    Tobacco use disorder    Unspecified disorder of external ear    Unspecified disorder of skin and  subcutaneous tissue    Unspecified otitis media     Past Surgical History:  Procedure Laterality Date   CESAREAN SECTION     CORONARY STENT INTERVENTION N/A 03/14/2022   Procedure: CORONARY STENT INTERVENTION;  Surgeon: Nelva Bush, MD;  Location: [facility];  Service: Cardiovascular;  Laterality: N/A;   CORONARY THROMBECTOMY N/A 03/14/2022   Procedure: Coronary Thrombectomy;   Surgeon: Nelva Bush, MD;  Location: [facility];  Service: Cardiovascular;  Laterality: N/A;   CORONARY/GRAFT ACUTE MI REVASCULARIZATION N/A 07/04/2021   Procedure: Coronary/Graft Acute MI Revascularization;  Surgeon: Wellington Hampshire, MD;  Location: [facility];  Service: Cardiovascular;  Laterality: N/A;   LEFT HEART CATH AND CORONARY ANGIOGRAPHY N/A 07/04/2021   Procedure: LEFT HEART CATH AND CORONARY ANGIOGRAPHY;  Surgeon: Wellington Hampshire, MD;  Location: [facility];  Service: Cardiovascular;  Laterality: N/A;   LEFT HEART CATH AND CORONARY ANGIOGRAPHY N/A 03/14/2022   Procedure: LEFT HEART CATH AND CORONARY ANGIOGRAPHY;  Surgeon: Nelva Bush, MD;  Location: [facility];  Service: Cardiovascular;  Laterality: N/A;   RIGHT/LEFT HEART CATH AND CORONARY ANGIOGRAPHY N/A 11/07/2021   Procedure: RIGHT/LEFT HEART CATH AND CORONARY ANGIOGRAPHY;  Surgeon: Wellington Hampshire, MD;  Location: [facility];  Service: Cardiovascular;  Laterality: N/A;     Current Outpatient Medications  Medication Sig Dispense Refill   albuterol (VENTOLIN HFA) 108 (90 Base) MCG/ACT inhaler Inhale 2 puffs into the lungs every 6 hours as needed for wheezing. 18 g 5   ALPRAZolam (XANAX) 0.5 MG tablet Take 1 tablet (0.5 mg total) by mouth 3 (three) times daily as needed for Sleep 90 tablet 2   amoxicillin-clavulanate (AUGMENTIN) 875-125 MG tablet Take 1 tablet (875 mg total) by mouth every 12 (twelve) hours for 10 days 20 tablet 0   aspirin 81 MG chewable tablet Chew 1 tablet (81 mg total) by mouth daily.     azithromycin (ZITHROMAX) 250 MG tablet Take 2 tablets ('500mg'$ ) by mouth on Day 1, then take 1 tablet ('250mg'$ ) by mouth on Days 2-5. 6 tablet 0   chlorpheniramine-HYDROcodone (TUSSIONEX) 10-8 MG/5ML Take 5 mLs by mouth every 12 (twelve) hours as needed for up to 7 days 70 mL 0   Cholecalciferol 1.25 MG (50000 UT) TABS Take 1.25 tablets by mouth once a week.      cyanocobalamin (VITAMIN B12) 1000 MCG tablet Take 1 tablet (1,000 mcg total) by mouth once daily 90 tablet 1   ergocalciferol (VITAMIN D2) 1.25 MG (50000 UT) capsule Take one capsule by mouth once a week 12 capsule 1   fluconazole (DIFLUCAN) 100 MG tablet Take 1 tablet (100 mg total) by mouth daily for 3 days. 3 tablet 0   fluconazole (DIFLUCAN) 150 MG tablet Take 1 tablet (150 mg total) by mouth once daily for 14 doses 14 tablet 0   furosemide (LASIX) 20 MG tablet Take 2 tablets (40 mg total) by mouth daily. 180 tablet 3   ipratropium-albuterol (DUONEB) 0.5-2.5 (3) MG/3ML SOLN Take 3 mls by nebulization 4 (four) times daily for 30 days 360 mL 1   medroxyPROGESTERone (DEPO-PROVERA) 150 MG/ML injection Inject into the muscle.     metoprolol succinate (TOPROL-XL) 25 MG 24 hr tablet Take 2 tablets (50 mg total) by mouth daily. 180 tablet 3   nicotine (NICODERM CQ - DOSED IN MG/24 HOURS) 14 mg/24hr patch Place 1 patch onto the skin daily for 14 days 28 patch 0   nitroGLYCERIN (  NITROSTAT) 0.4 MG SL tablet Place 1 tablet (0.4 mg total) under the tongue every 5 (five) minutes as needed for chest pain. 50 tablet 0   nystatin (MYCOSTATIN) 100000 UNIT/ML suspension Swish and spit 5 mLs 4 (four) times daily for 10 days 200 mL 0   nystatin (MYCOSTATIN) 100000 UNIT/ML suspension Swish and swallow 5 mLs by mouth 4 (four) times daily for 10 days 200 mL 1   potassium chloride SA (KLOR-CON M) 20 MEQ tablet Take 2 tablets (40 mEq total) by mouth daily. 180 tablet 3   prasugrel (EFFIENT) 10 MG TABS tablet Take 1 tablet (10 mg total) by mouth daily. 30 tablet 0   predniSONE (DELTASONE) 10 MG tablet '40mg'$  x 2 days, '30mg'$  x 2days, '20mg'$  x 2days, '10mg'$  x 2days 20 tablet 0   predniSONE (DELTASONE) 20 MG tablet Take 1 tablet (20 mg total) by mouth once daily for 5 days 5 tablet 0   rosuvastatin (CRESTOR) 40 MG tablet Take 1 tablet (40 mg total) by mouth daily. 30 tablet 0   saccharomyces boulardii (FLORASTOR) 250 MG capsule  Take 1 capsule (250 mg total) by mouth 2 (two) times daily for 10 days 40 capsule 1   dapagliflozin propanediol (FARXIGA) 10 MG TABS tablet Take 1 tablet (10 mg total) by mouth daily before breakfast. (Patient not taking: Reported on 03/24/2022) 30 tablet 3   DULoxetine (CYMBALTA) 30 MG capsule Take 1 capsule (30 mg total) by mouth once daily (Patient not taking: Reported on 03/24/2022) 90 capsule 1   No current facility-administered medications for this visit.    Allergies:   Buspirone, Citalopram, Metrizamide, Sulfa antibiotics, Contrast media [iodinated contrast media], Sulfa drugs cross reactors, and Sulfasalazine    Social History:  The patient  reports that she quit smoking about 6 weeks ago. Her smoking use included cigarettes. She has never used smokeless tobacco. She reports that she does not drink alcohol and does not use drugs.   Family History:  The patient's family history includes Breast cancer in her maternal grandmother; Cancer in her father and paternal grandfather; Depression in her mother; Hypertension in her sister.    ROS:  Please see the history of present illness.   Otherwise, review of systems are positive for none.   All other systems are reviewed and negative.    PHYSICAL EXAM: VS:  BP 110/76 (BP Location: Left Arm, Patient Position: Sitting, Cuff Size: Normal)   Pulse (!) 110   Ht '5\' 1"'$  (1.549 m)   Wt 176 lb 6.4 oz (80 kg)   SpO2 97%   BMI 33.33 kg/m  , BMI Body mass index is 33.33 kg/m. GEN: Well nourished, well developed, in no acute distress  HEENT: normal  Neck: no JVD, carotid bruits, or masses Cardiac: RRR; no murmurs, rubs, or gallops,no edema  Respiratory:  clear to auscultation bilaterally, normal work of breathing GI: soft, nontender, nondistended, + BS MS: no deformity or atrophy  Skin: warm and dry, no rash Neuro:  Strength and sensation are intact Psych: euthymic mood, full affect    EKG:  EKG is ordered today. EKG showed sinus  tachycardia with prior inferior infarct.   Recent Labs: 09/05/2021: ALT 19 03/13/2022: B Natriuretic Peptide 92.8; Magnesium 2.0 03/15/2022: Hemoglobin 12.3; Platelets 306 04/12/2022: BUN 5; Creatinine, Ser 0.78; Potassium 2.7; Sodium 141    Lipid Panel    Component Value Date/Time   CHOL 201 (H) 03/14/2022 0148   CHOL 280 (H) 11/29/2011 1326   TRIG 180 (H)  03/14/2022 0148   TRIG 186 11/29/2011 1326   HDL 28 (L) 03/14/2022 0148   HDL 29 (L) 11/29/2011 1326   CHOLHDL 7.2 03/14/2022 0148   VLDL 36 03/14/2022 0148   VLDL 37 11/29/2011 1326   LDLCALC 137 (H) 03/14/2022 0148   LDLCALC 214 (H) 11/29/2011 1326      Wt Readings from Last 3 Encounters:  04/25/22 176 lb 6.4 oz (80 kg)  03/24/22 179 lb (81.2 kg)  03/14/22 186 lb (84.4 kg)          10/26/2021    3:07 PM  PAD Screen  Previous PAD dx? No  Previous surgical procedure? No  Pain with walking? No  Feet/toe relief with dangling? No  Painful, non-healing ulcers? No  Extremities discolored? No      ASSESSMENT AND PLAN:  1.  Coronary artery disease involving native coronary arteries without angina: She is doing well at the present time with no recurrent anginal symptoms.  Continue aspirin indefinitely and prasugrel for at least 1 year and preferably longer given her presentation with very late stent thrombosis.  She is a poor responder to clopidogrel.    2.  Chronic systolic heart failure: Most recent ejection fraction was 35 to 40%.  Continue Toprol 50 mg once daily.  I elected to resume Farxiga 10 mg once daily and decrease furosemide to 20 mg daily.  Given her issues with chronic hypokalemia, I added spironolactone 12.5 mg once daily.  Check basic metabolic profile in 1 week. Her blood pressure remains low and does not allow the addition of Entresto.  3.  Paroxysmal supraventricular tachycardia: She is mostly having issues with sinus tachycardia at the present time.  Continue Toprol 50 mg once daily and will consider  increasing the dose if blood pressure tolerates.  The other possibility would be ivabradine but cost might be an issue  4.  Hyperlipidemia: She was recent 3 switched from atorvastatin to rosuvastatin given that her LDL was above 100.  Will plan on rechecking lipid profile in the near future and consider adding ezetimibe.  5.  Tobacco use: She quit smoking last month and she is currently using a nicotine patch  6.  Hypokalemia: Continue potassium replacement.  In addition, I added small dose spironolactone and hopefully we can discontinue furosemide at some point in the future.   Disposition: Follow-up in 2 months.  Signed,  Kathlyn Sacramento, MD  04/25/2022 4:37 PM    [place]

## 2022-04-26 ENCOUNTER — Other Ambulatory Visit: Payer: Self-pay

## 2022-04-27 ENCOUNTER — Encounter: Payer: Self-pay | Admitting: Cardiovascular Disease

## 2022-04-28 ENCOUNTER — Other Ambulatory Visit: Payer: Self-pay

## 2022-05-15 ENCOUNTER — Other Ambulatory Visit: Payer: Self-pay | Admitting: Internal Medicine

## 2022-05-15 ENCOUNTER — Other Ambulatory Visit: Payer: Self-pay

## 2022-05-15 MED ORDER — PRASUGREL HCL 10 MG PO TABS
10.0000 mg | ORAL_TABLET | Freq: Every day | ORAL | 3 refills | Status: DC
  Filled 2022-05-15 – 2022-05-17 (×2): qty 30, 30d supply, fill #0
  Filled 2022-06-12: qty 30, 30d supply, fill #1
  Filled 2022-07-11: qty 30, 30d supply, fill #2
  Filled 2022-08-10: qty 30, 30d supply, fill #3
  Filled 2022-09-15: qty 30, 30d supply, fill #4
  Filled 2022-10-10: qty 30, 30d supply, fill #5
  Filled 2022-11-13: qty 30, 30d supply, fill #6
  Filled 2022-12-18: qty 30, 30d supply, fill #7
  Filled 2023-01-18 (×2): qty 30, 30d supply, fill #8

## 2022-05-15 MED ORDER — ROSUVASTATIN CALCIUM 40 MG PO TABS
40.0000 mg | ORAL_TABLET | Freq: Every day | ORAL | 3 refills | Status: DC
  Filled 2022-05-15 – 2022-05-17 (×2): qty 30, 30d supply, fill #0
  Filled 2022-06-12: qty 30, 30d supply, fill #1
  Filled 2022-07-11: qty 30, 30d supply, fill #2
  Filled 2022-08-11: qty 30, 30d supply, fill #3
  Filled 2022-09-12: qty 30, 30d supply, fill #4
  Filled 2022-10-10: qty 30, 30d supply, fill #5
  Filled 2022-11-13: qty 30, 30d supply, fill #6
  Filled 2022-12-18: qty 30, 30d supply, fill #7
  Filled 2023-01-18: qty 30, 30d supply, fill #8

## 2022-05-15 NOTE — Telephone Encounter (Signed)
Please see note below. 

## 2022-05-15 NOTE — Telephone Encounter (Signed)
Lattie Haw,  I don't see Effient on patients medication list but it looks like per MyChart 12/21 she should be on it.  Just wanted to confirm.

## 2022-05-16 ENCOUNTER — Other Ambulatory Visit: Payer: Self-pay

## 2022-05-16 ENCOUNTER — Telehealth: Payer: Self-pay | Admitting: Cardiology

## 2022-05-16 MED ORDER — ROSUVASTATIN CALCIUM 40 MG PO TABS: 40.0000 mg | ORAL_TABLET | Freq: Every day | ORAL | 3 refills | Status: DC

## 2022-05-16 MED ORDER — PRASUGREL HCL 10 MG PO TABS: 10.0000 mg | ORAL_TABLET | Freq: Every day | ORAL | 3 refills | Status: DC

## 2022-05-16 MED ORDER — FLUCONAZOLE 100 MG PO TABS
100.0000 mg | ORAL_TABLET | Freq: Every day | ORAL | 0 refills | Status: DC
  Filled 2022-05-16: qty 10, 10d supply, fill #0
  Filled 2022-05-16: qty 4, 4d supply, fill #0

## 2022-05-16 NOTE — Telephone Encounter (Signed)
Patient contacted the answering service stating that she was out of her Effient.  The pharmacy where she normally gets her prescriptions refilled is already closed the day.  Requested that refills of her Effient and Crestor [facility] [place], [place]. Verified pharmacy and refills sent.  Margie Billet, PA-C 05/16/2022 6:11 PM

## 2022-05-17 ENCOUNTER — Other Ambulatory Visit: Payer: Self-pay

## 2022-05-17 MED ORDER — ALPRAZOLAM 0.5 MG PO TABS
0.5000 mg | ORAL_TABLET | Freq: Three times a day (TID) | ORAL | 2 refills | Status: DC | PRN
  Filled 2022-05-17 – 2022-06-12 (×2): qty 90, 30d supply, fill #0
  Filled 2022-07-11: qty 90, 30d supply, fill #1
  Filled 2022-08-10: qty 90, 30d supply, fill #2

## 2022-05-25 ENCOUNTER — Encounter: Payer: Self-pay | Admitting: *Deleted

## 2022-06-02 ENCOUNTER — Encounter: Payer: Self-pay | Admitting: Emergency Medicine

## 2022-06-02 ENCOUNTER — Emergency Department
Admission: EM | Admit: 2022-06-02 | Discharge: 2022-06-02 | Disposition: A | Discharge status: Home / Self Care | Payer: 59 | Attending: Emergency Medicine | Admitting: Emergency Medicine

## 2022-06-02 ENCOUNTER — Emergency Department: Payer: 59

## 2022-06-02 ENCOUNTER — Other Ambulatory Visit: Payer: Self-pay

## 2022-06-02 DIAGNOSIS — I509 Heart failure, unspecified: Secondary | ICD-10-CM | POA: Diagnosis not present

## 2022-06-02 DIAGNOSIS — I2510 Atherosclerotic heart disease of native coronary artery without angina pectoris: Secondary | ICD-10-CM | POA: Insufficient documentation

## 2022-06-02 DIAGNOSIS — R072 Precordial pain: Secondary | ICD-10-CM | POA: Diagnosis not present

## 2022-06-02 DIAGNOSIS — I110 Hypertensive heart disease with heart failure: Secondary | ICD-10-CM | POA: Diagnosis not present

## 2022-06-02 DIAGNOSIS — R0789 Other chest pain: Secondary | ICD-10-CM | POA: Diagnosis not present

## 2022-06-02 DIAGNOSIS — R079 Chest pain, unspecified: Secondary | ICD-10-CM | POA: Diagnosis not present

## 2022-06-02 LAB — BASIC METABOLIC PANEL
Anion gap: 9 (ref 5–15)
BUN: 7 mg/dL (ref 6–20)
CO2: 25 mmol/L (ref 22–32)
Calcium: 9 mg/dL (ref 8.9–10.3)
Chloride: 106 mmol/L (ref 98–111)
Creatinine, Ser: 0.84 mg/dL (ref 0.44–1.00)
GFR, Estimated: >60 mL/min (ref >60)
Glucose, Bld: 125 mg/dL — ABNORMAL HIGH (ref 70–99)
Potassium: 3.2 mmol/L — ABNORMAL LOW (ref 3.5–5.1)
Sodium: 140 mmol/L (ref 135–145)

## 2022-06-02 LAB — CBC
HCT: 41.5 % (ref 36.0–46.0)
Hemoglobin: 13.9 g/dL (ref 12.0–15.0)
MCH: 31.4 pg (ref 26.0–34.0)
MCHC: 33.5 g/dL (ref 30.0–36.0)
MCV: 93.7 fL (ref 80.0–100.0)
Platelets: 343 10*3/uL (ref 150–400)
RBC: 4.43 MIL/uL (ref 3.87–5.11)
RDW: 13.2 % (ref 11.5–15.5)
WBC: 8.6 10*3/uL (ref 4.0–10.5)
nRBC: 0 % (ref 0.0–0.2)

## 2022-06-02 LAB — TROPONIN I (HIGH SENSITIVITY)
Troponin I (High Sensitivity): 7 ng/L (ref <18)
Troponin I (High Sensitivity): 8 ng/L (ref <18)

## 2022-06-02 NOTE — ED Provider Notes (Signed)
[facility] Provider Note    Event Date/Time   First MD Initiated Contact with Patient 06/02/22 1324     (approximate)   History   Chest Pain   HPI  Lisa Davila is a 47 y.o. female   Past medical history of CAD, CHF, hypertension, hyperlipidemia, fibromyalgia, chronic back pain who presents to the emergency department with chest pain at rest morning that was transient and resolved after she took aspirin and a nitro.  She had an NSTEMI in November with stent placement.  She has been compliant with medications.  Denies respiratory infectious symptoms, denies shortness of breath, leg pain or swelling.  No symptoms currently.   Substernal nonradiating.  Nonexertional.  External Medical Documents Reviewed: Discharge summary from November 2023 with NSTEMI and PCI      Physical Exam   Triage Vital Signs: ED Triage Vitals  Enc Vitals Group     BP 06/02/22 1056 119/80     Pulse Rate 06/02/22 1056 95     Resp 06/02/22 1056 18     Temp 06/02/22 1056 98 F (36.7 C)     Temp src --      SpO2 06/02/22 1056 100 %     Weight --      Height --      Head Circumference --      Peak Flow --      Pain Score 06/02/22 1055 8     Pain Loc --      Pain Edu? --      Excl. in [place]? --     Most recent vital signs: Vitals:   06/02/22 1056  BP: 119/80  Pulse: 95  Resp: 18  Temp: 98 F (36.7 C)  SpO2: 100%    General: Awake, no distress.  CV:  Good peripheral perfusion.  Resp:  Normal effort.  Abd:  No distention.  Other:  Awake alert oriented comfortable nontoxic-appearing.  Radial pulses intact bilaterally equal, lungs clear to auscultation without wheezing or focality, heart sounds without obvious murmurs abdomen soft and nontender.   ED Results / Procedures / Treatments   Labs (all labs ordered are listed, but only abnormal results are displayed) Labs Reviewed  BASIC METABOLIC PANEL - Abnormal; Notable for the following components:       Result Value   Potassium 3.2 (*)    Glucose, Bld 125 (*)    All other components within normal limits  CBC  POC URINE PREG, ED  TROPONIN I (HIGH SENSITIVITY)  TROPONIN I (HIGH SENSITIVITY)     I ordered and reviewed the above labs they are notable for troponin was 7, 8  EKG  ED ECG REPORT I, Lucillie Garfinkel, the attending physician, personally viewed and interpreted this ECG.   Date: 06/02/2022  EKG Time: 1054  Rate: 93  Rhythm: NSR  Axis: nl  Intervals:none  ST&T Change: T Wave inversions in inferolateral leads are not new, and they were present in the November 2023 EKG    RADIOLOGY I independently reviewed and interpreted chest x-ray see no obvious pneumothorax or focality   PROCEDURES:  Critical Care performed: No  Procedures   MEDICATIONS ORDERED IN ED: Medications - No data to display  IMPRESSION / MDM / [place] / ED COURSE  I reviewed the triage vital signs and the nursing notes.  Patient's presentation is most consistent with acute presentation with potential threat to life or bodily function.  Differential diagnosis includes, but is not limited to, ACS, PE, dissection, musculoskeletal pain, costochondritis   MDM: Patient with significant CAD history with atypical chest pain now resolved.  Fortunately EKG is nonischemic, and troponins have been flat x 2 and she remains asymptomatic in the emergency department.  Doubt ACS, dissection, pulmonary embolism, she will follow-up with her cardiologist and come back to the emergency department for any new or worsening symptoms.  Her back pain is unrelated chronic unchanged.  I considered hospitalization for admission or observation serial troponins ongoing monitoring but given troponins flat x 2 and has been asymptomatic for many hours now, outpatient monitoring and follow-up most appropriate at this time patient in agreement.        FINAL CLINICAL IMPRESSION(S) / ED  DIAGNOSES   Final diagnoses:  Atypical chest pain     Rx / DC Orders   ED Discharge Orders     None        Note:  This document was prepared using Dragon voice recognition software and may include unintentional dictation errors.    Lucillie Garfinkel, MD 06/02/22 [phone]

## 2022-06-02 NOTE — ED Notes (Signed)
Upon entering the room this RN informed pt that I was going to be drawing a second troponin. Pt immediately started questioning this RN stating "why didn't they do that out in triage two hours ago. I've been here for four hours. Why couldn't they have already done this?"   Attempted to start an IV while drawing second troponin. Angiocath would not advance due to valve in the vein. Attempted to flush past the valve. Pt reported pain. No IV inserted at this time.

## 2022-06-02 NOTE — Discharge Instructions (Signed)
Thank you for choosing us for your health care today!  Please see your primary doctor this week for a follow up appointment.   Sometimes, in the early stages of certain disease courses it is difficult to detect in the emergency department evaluation -- so, it is important that you continue to monitor your symptoms and call your doctor right away or return to the emergency department if you develop any new or worsening symptoms.  Please go to the following website to schedule new (and existing) patient appointments:   [email or web address]  If you do not have a primary doctor try calling the following clinics to establish care:  If you have insurance:  [facility] [phone] [address] [place] [address]   [facility]  [phone] [address] [place] [address] [place] [address]   If you do not have insurance:  [facility]  [phone] [address] [place] [address]   The following is another list of primary care offices in the area who are accepting new patients at this time.  Please reach out to one of them directly and let them know you would like to schedule an appointment to follow up on an Emergency Department visit, and/or to establish a new primary care provider (PCP).  There are likely other primary care clinics in the are who are accepting new patients, but this is an excellent place to start:  [facility] Lead physician: Dr Angela Bacigalupo [address] [place], [place] [address] [phone]  [facility] Lead Physician: Dr Krichna Sowles [address], [place], [place] [address] [phone]  [facility]  Lead Physician: Dr Megan Johnson [address], [place], [place] [address] [phone]  [facility] [place] [facility] Lead Physician: Dr Alex Karamalegos [address], [place], [place] [address] [phone]  [facility] [place] Lead Physician: Dr Laura Berglund [address], [place], [place] [address] [phone]   It was my pleasure to care for you today.   Livia Tarr S. Threasa Kinch, MD  

## 2022-06-02 NOTE — ED Notes (Signed)
Pt verbalizes understanding of discharge instructions. Opportunity for questioning and answers were provided. Pt discharged from ED to home.   ? ?

## 2022-06-12 ENCOUNTER — Other Ambulatory Visit: Payer: Self-pay

## 2022-06-13 NOTE — Telephone Encounter (Signed)
I don't mind doing one for her. Are there any specifics her landlord requires other than letterhead?

## 2022-06-15 ENCOUNTER — Other Ambulatory Visit: Payer: Self-pay

## 2022-06-15 ENCOUNTER — Ambulatory Visit: Payer: 59 | Admitting: Cardiovascular Disease

## 2022-06-15 MED ORDER — AZITHROMYCIN 250 MG PO TABS
ORAL_TABLET | ORAL | 0 refills | Status: DC
  Filled 2022-06-15: qty 6, 5d supply, fill #0

## 2022-06-15 MED ORDER — PREDNISONE 20 MG PO TABS
ORAL_TABLET | ORAL | 0 refills | Status: DC
  Filled 2022-06-15: qty 9, 6d supply, fill #0

## 2022-06-15 MED ORDER — BENZONATATE 200 MG PO CAPS
200.0000 mg | ORAL_CAPSULE | Freq: Three times a day (TID) | ORAL | 0 refills | Status: DC | PRN
  Filled 2022-06-15: qty 20, 7d supply, fill #0

## 2022-06-23 ENCOUNTER — Encounter: Payer: Self-pay | Admitting: Cardiovascular Disease

## 2022-06-23 ENCOUNTER — Emergency Department
Admission: EM | Admit: 2022-06-23 | Discharge: 2022-06-23 | Disposition: A | Discharge status: Home / Self Care | Payer: 59 | Source: Home / Self Care | Attending: Emergency Medicine | Admitting: Emergency Medicine

## 2022-06-23 ENCOUNTER — Other Ambulatory Visit: Payer: Self-pay

## 2022-06-23 ENCOUNTER — Emergency Department: Payer: 59

## 2022-06-23 ENCOUNTER — Ambulatory Visit: Payer: 59 | Attending: Cardiovascular Disease | Admitting: Cardiovascular Disease

## 2022-06-23 ENCOUNTER — Emergency Department
Admission: EM | Admit: 2022-06-23 | Discharge: 2022-06-23 | Disposition: A | Discharge status: Home / Self Care | Payer: 59 | Attending: Emergency Medicine | Admitting: Emergency Medicine

## 2022-06-23 ENCOUNTER — Telehealth: Payer: Self-pay | Admitting: Cardiovascular Disease

## 2022-06-23 VITALS — BP 110/70 | HR 116 | Ht 61.0 in | Wt 174.1 lb

## 2022-06-23 DIAGNOSIS — I110 Hypertensive heart disease with heart failure: Secondary | ICD-10-CM | POA: Insufficient documentation

## 2022-06-23 DIAGNOSIS — E785 Hyperlipidemia, unspecified: Secondary | ICD-10-CM

## 2022-06-23 DIAGNOSIS — R002 Palpitations: Secondary | ICD-10-CM

## 2022-06-23 DIAGNOSIS — I509 Heart failure, unspecified: Secondary | ICD-10-CM | POA: Diagnosis not present

## 2022-06-23 DIAGNOSIS — I25110 Atherosclerotic heart disease of native coronary artery with unstable angina pectoris: Secondary | ICD-10-CM | POA: Diagnosis not present

## 2022-06-23 DIAGNOSIS — Z5321 Procedure and treatment not carried out due to patient leaving prior to being seen by health care provider: Secondary | ICD-10-CM | POA: Diagnosis not present

## 2022-06-23 DIAGNOSIS — I498 Other specified cardiac arrhythmias: Secondary | ICD-10-CM

## 2022-06-23 DIAGNOSIS — I5022 Chronic systolic (congestive) heart failure: Secondary | ICD-10-CM

## 2022-06-23 DIAGNOSIS — R0789 Other chest pain: Secondary | ICD-10-CM | POA: Insufficient documentation

## 2022-06-23 DIAGNOSIS — R079 Chest pain, unspecified: Secondary | ICD-10-CM | POA: Diagnosis not present

## 2022-06-23 DIAGNOSIS — R0602 Shortness of breath: Secondary | ICD-10-CM | POA: Insufficient documentation

## 2022-06-23 LAB — CBC
HCT: 44.5 % (ref 36.0–46.0)
Hemoglobin: 15.3 g/dL — ABNORMAL HIGH (ref 12.0–15.0)
MCH: 31.5 pg (ref 26.0–34.0)
MCHC: 34.4 g/dL (ref 30.0–36.0)
MCV: 91.8 fL (ref 80.0–100.0)
Platelets: 374 10*3/uL (ref 150–400)
RBC: 4.85 MIL/uL (ref 3.87–5.11)
RDW: 12.7 % (ref 11.5–15.5)
WBC: 11.5 10*3/uL — ABNORMAL HIGH (ref 4.0–10.5)
nRBC: 0 % (ref 0.0–0.2)

## 2022-06-23 LAB — BASIC METABOLIC PANEL
Anion gap: 10 (ref 5–15)
BUN: 6 mg/dL (ref 6–20)
CO2: 27 mmol/L (ref 22–32)
Calcium: 9.1 mg/dL (ref 8.9–10.3)
Chloride: 102 mmol/L (ref 98–111)
Creatinine, Ser: 0.91 mg/dL (ref 0.44–1.00)
GFR, Estimated: >60 mL/min (ref >60)
Glucose, Bld: 100 mg/dL — ABNORMAL HIGH (ref 70–99)
Potassium: 3 mmol/L — ABNORMAL LOW (ref 3.5–5.1)
Sodium: 139 mmol/L (ref 135–145)

## 2022-06-23 LAB — TROPONIN I (HIGH SENSITIVITY): Troponin I (High Sensitivity): 8 ng/L (ref <18)

## 2022-06-23 LAB — PROTIME-INR
INR: 1 (ref 0.8–1.2)
Prothrombin Time: 13.2 seconds (ref 11.4–15.2)

## 2022-06-23 NOTE — Patient Instructions (Signed)
Medication Instructions:  No changes *If you need a refill on your cardiac medications before your next appointment, please call your pharmacy*   Lab Work: None ordered If you have labs (blood work) drawn today and your tests are completely normal, you will receive your results only by: [place] (if you have MyChart) OR A paper copy in the mail If you have any lab test that is abnormal or we need to change your treatment, we will call you to review the results.   Testing/Procedures: None ordered   Follow-Up: At [facility], you and your health needs are our priority.  As part of our continuing mission to provide you with exceptional heart care, we have created designated Provider Care Teams.  These Care Teams include your primary Cardiologist (physician) and Advanced Practice Providers (APPs -  Physician Assistants and Nurse Practitioners) who all work together to provide you with the care you need, when you need it.  We recommend signing up for the patient portal called "MyChart".  Sign up information is provided on this After Visit Summary.  MyChart is used to connect with patients for Virtual Visits (Telemedicine).  Patients are able to view lab/test results, encounter notes, upcoming appointments, etc.  Non-urgent messages can be sent to your provider as well.   To learn more about what you can do with MyChart, go to [email or web address].    Your next appointment:   Sent to the ED

## 2022-06-23 NOTE — ED Triage Notes (Signed)
Pt comes with c/o cp. Pt states hx of stemi. Pt was here earlier and had left to go home and get clothes. Pt was advised that she would have to check back in again.   Pt understood and is now back to be seen by EDP.

## 2022-06-23 NOTE — Telephone Encounter (Signed)
Called the patient. She has been added today at 2:40

## 2022-06-23 NOTE — ED Notes (Signed)
Pt called and apparently went home to get clothes

## 2022-06-23 NOTE — Telephone Encounter (Signed)
Spoke with patient of Dr. Fletcher Anon [facility]). She has been having SOB, chest tightness for at least 2 weeks. She had COVID last week. She reports the chest pressure is worse than it was when she went to ED on 1/26. She said she "feels weird". She gets dizzy, feels like she will pass out. Said she felt like she was having a heart attack yesterday. She reports feeling the same way as she did prior to her NSTEMI in November. She was at work yesterday and thought this may have been anxiety and took anxiolytic but still had chest tightness. She did not think to go to ED yesterday. She would like to make sure that her symptoms are not due to a blockage. Will send to MD/RN

## 2022-06-23 NOTE — Telephone Encounter (Signed)
Pt c/o Shortness Of Breath: STAT if SOB developed within the last 24 hours or pt is noticeably SOB on the phone  1. Are you currently SOB (can you hear that pt is SOB on the phone)?   No  2. How long have you been experiencing SOB?   Over a week, due to COVID  3. Are you SOB when sitting or when up moving around?   Both sitting and moving around  4. Are you currently experiencing any other symptoms?   A little tiredness and blurred vision  Patient stated she is having tightness in her chest and she feels "different".

## 2022-06-23 NOTE — ED Provider Notes (Signed)
[facility] Provider Note    Event Date/Time   First MD Initiated Contact with Patient 06/23/22 1736     (approximate)  History   Chief Complaint: Chest Pain  HPI  Lisa Davila is a 47 y.o. female with a past medical history of prior MI x 2 with 2 stents, hypertension, hyperlipidemia, CHF, presents to the emergency department for an irregular EKG.  According to the patient she was diagnosed with COVID, symptoms for approximately last 12 days.  Patient states he had been feeling better but has been experiencing some chest tightness at times.  Patient saw her cardiologist (Dr. Fletcher Anon), and was referred to the emergency department for an abnormal EKG.  Here patient states she believes she was just anxious earlier, took her metoprolol to 40 and states she feels better since then.  Patient came to the emergency department earlier today after cardiology appointment but had to leave to take care of a few things and then returned this evening.  Patient states she feels fine denies any chest pain or shortness of breath denies any palpitations or symptoms currently.  Physical Exam   Triage Vital Signs: ED Triage Vitals  Enc Vitals Group     BP 06/23/22 1657 110/79     Pulse Rate 06/23/22 1657 100     Resp 06/23/22 1657 18     Temp 06/23/22 1657 97.8 F (36.6 C)     Temp Source 06/23/22 1657 Oral     SpO2 06/23/22 1657 96 %     Weight --      Height --      Head Circumference --      Peak Flow --      Pain Score 06/23/22 1652 3     Pain Loc --      Pain Edu? --      Excl. in [place]? --     Most recent vital signs: Vitals:   06/23/22 1657  BP: 110/79  Pulse: 100  Resp: 18  Temp: 97.8 F (36.6 C)  SpO2: 96%    General: Awake, no distress.  CV:  Good peripheral perfusion.  Regular rate and rhythm  Resp:  Normal effort.  Equal breath sounds bilaterally.  Abd:  No distention.  Soft, nontender.  No rebound or guarding.  ED Results / Procedures /  Treatments   EKG  EKG viewed and interpreted by myself shows a normal sinus rhythm at 90 bpm with a narrow QRS, normal axis, normal intervals besides slight QTc prolongation, nonspecific but no concerning ST changes.  RADIOLOGY  X-ray from earlier today is negative for acute abnormality.   MEDICATIONS ORDERED IN ED: Medications - No data to display   IMPRESSION / MDM / [place] / ED COURSE  I reviewed the triage vital signs and the nursing notes.  Patient's presentation is most consistent with acute presentation with potential threat to life or bodily function.  Patient presents to the emergency department for palpitations abnormal EKG sent from her cardiology office.  Patient is approximate 12 days into her COVID illness states she is feeling much better from that standpoint.  Overall the patient appears well, reassuring physical exam and reassuring vital signs.  I reviewed the patient's workup from earlier today her lab work including CBC chemistry and troponin were reassuring.  Chest x-ray was clear.  However at that time patient's EKG did appear to show bigeminy.  Repeat EKG performed during this visit shows normal sinus rhythm.  As the patient's workup is reassuring she denies any symptoms currently and her EKG is normal we will have the patient follow-up with her cardiologist.  Provided my typical chest pain return precautions.  Patient agreeable to plan of care.  FINAL CLINICAL IMPRESSION(S) / ED DIAGNOSES   Palpitations  Note:  This document was prepared using Dragon voice recognition software and may include unintentional dictation errors.   Harvest Dark, MD 06/23/22 [phone]

## 2022-06-23 NOTE — Progress Notes (Signed)
LHC is scheduled for 06/26/2022 at 12:30 PM. The patient has a contrast allergy with prophylaxis signed and held as part of the cath order set. The patient will need her prophylaxis started 13 hours PRIOR to the start of her cath, which would be 23:30 on 06/25/2022. I will convey this information to the covering RN on 2/18 as well.

## 2022-06-23 NOTE — Progress Notes (Signed)
Cardiology Office Note   Date:  06/23/2022   ID:  Lisa Davila, DOB 1976/04/27, MRN [id number]  PCP:  Tracie Harrier, MD  Cardiologist:   Kathlyn Sacramento, MD   Chief Complaint  Patient presents with   OTHER    Chest pain/sob. Pt c/o being prescribed 2 new med's but never receiving new Rx to have them filled. Please advise. Meds reviewed verbally with pt.      History of Present Illness: Lisa Davila is a 47 y.o. female who is here today for a follow-up visit regarding coronary artery disease and chronic systolic heart failure.  She has known history of SVT status post previous EP study with no inducible arrhythmia, essential hypertension, hyperlipidemia, fibromyalgia, tobacco use, anxiety and hypokalemia. She presented in February 2023 with chest pain and was found to have inferior ST elevation.  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  There was mild nonobstructive disease affecting the left coronary arteries.  Echocardiogram showed an EF of 50 to 55%.  Brilinta was switched to clopidogrel due to persistent dyspnea. She had COVID-19 infection in May.  She had recurrent chest pain after that we will multiple emergency room visits. Repeat echocardiogram in April showed an EF of 35%.  Due to a drop in her ejection fraction and her symptoms, right and left cardiac catheterization was done in July, 2023 which showed patent RCA stent with mild in-stent restenosis and no evidence of obstructive disease affecting the left coronary arteries.  Ejection fraction was 35%.  Right heart catheterization showed mildly to moderately elevated filling pressures, minimal pulmonary hypertension and normal cardiac output.   She presented in November, 2023 with recurrent myocardial infarction.  Cardiac catheterization showed occluded RCA  stent due to late stent thrombosis.  She underwent successful mechanical thrombectomy and drug eluting stent placement.  She was switched from clopidogrel to prasugrel.  Genotype testing subsequently showed intermediate 99991111 metabolic activity. Echocardiogram showed an EF of 35 to 40%.  She quit smoking since her second myocardial infarction.    She was diagnosed with COVID-19 last week and had symptoms that included vomiting, diarrhea, fever and bodyaches.  Shortly after that, she started having substernal chest tightness but the episodes initially were short-lived.  However, she had a prolonged episode yesterday that lasted more than 30 minutes and was very similar to her most recent myocardial infarction.  She has not felt well since then and she continues to have chest tightness today.   Past Medical History:  Diagnosis Date   Acute ethmoidal sinusitis    Acute ethmoidal sinusitis    Acute maxillary sinusitis    Acute upper respiratory infections of unspecified site    Anxiety state, unspecified    Chest pain, unspecified    CHF (congestive heart failure) ([place])    Coronary artery disease    Depression    Diarrhea    Esophageal reflux    Generalized anxiety disorder    Hyperlipidemia    Hypertension    Hypoglycemia, unspecified    Lumbago    Nausea with vomiting    Obstructive chronic bronchitis with exacerbation (HCC)    Other and unspecified hyperlipidemia    Other and unspecified peripheral vertigo(386.19)    Other bursitis disorders    Other malaise and fatigue    Other specified diseases due to viruses    Other vitamin B12 deficiency  anemia    Pain in limb    Pernicious anemia    Sleep related hypoventilation/hypoxemia in conditions classifiable elsewhere    SVT (supraventricular tachycardia)    Swelling, mass, or lump in head and neck    Tobacco use disorder    Unspecified disorder of external ear    Unspecified disorder of skin and subcutaneous tissue     Unspecified otitis media     Past Surgical History:  Procedure Laterality Date   CESAREAN SECTION     CORONARY STENT INTERVENTION N/A 03/14/2022   Procedure: CORONARY STENT INTERVENTION;  Surgeon: Nelva Bush, MD;  Location: [place] [facility];  Service: Cardiovascular;  Laterality: N/A;   CORONARY THROMBECTOMY N/A 03/14/2022   Procedure: Coronary Thrombectomy;  Surgeon: Nelva Bush, MD;  Location: [place] [facility];  Service: Cardiovascular;  Laterality: N/A;   CORONARY/GRAFT ACUTE MI REVASCULARIZATION N/A 07/04/2021   Procedure: Coronary/Graft Acute MI Revascularization;  Surgeon: Wellington Hampshire, MD;  Location: [place] [facility];  Service: Cardiovascular;  Laterality: N/A;   LEFT HEART CATH AND CORONARY ANGIOGRAPHY N/A 07/04/2021   Procedure: LEFT HEART CATH AND CORONARY ANGIOGRAPHY;  Surgeon: Wellington Hampshire, MD;  Location: [place] [facility];  Service: Cardiovascular;  Laterality: N/A;   LEFT HEART CATH AND CORONARY ANGIOGRAPHY N/A 03/14/2022   Procedure: LEFT HEART CATH AND CORONARY ANGIOGRAPHY;  Surgeon: Nelva Bush, MD;  Location: [place] [facility];  Service: Cardiovascular;  Laterality: N/A;   RIGHT/LEFT HEART CATH AND CORONARY ANGIOGRAPHY N/A 11/07/2021   Procedure: RIGHT/LEFT HEART CATH AND CORONARY ANGIOGRAPHY;  Surgeon: Wellington Hampshire, MD;  Location: [place] [facility];  Service: Cardiovascular;  Laterality: N/A;     Current Outpatient Medications  Medication Sig Dispense Refill   albuterol (VENTOLIN HFA) 108 (90 Base) MCG/ACT inhaler Inhale 2 puffs into the lungs every 6 hours as needed for wheezing. 18 g 5   ALPRAZolam (XANAX) 0.5 MG tablet Take 1 tablet (0.5 mg total) by mouth 3 (three) times daily as needed for Sleep 90 tablet 2   aspirin 81 MG chewable tablet Chew 1 tablet (81 mg total) by mouth daily.     chlorpheniramine-HYDROcodone (TUSSIONEX) 10-8 MG/5ML Take 5 mLs by mouth every 12 (twelve) hours as needed for up to 7 days 70 mL 0    Cholecalciferol 1.25 MG (50000 UT) TABS Take 1.25 tablets by mouth once a week.     cyanocobalamin (VITAMIN B12) 1000 MCG tablet Take 1 tablet (1,000 mcg total) by mouth once daily 90 tablet 1   DULoxetine (CYMBALTA) 30 MG capsule Take 1 capsule (30 mg total) by mouth once daily 90 capsule 1   ergocalciferol (VITAMIN D2) 1.25 MG (50000 UT) capsule Take one capsule by mouth once a week 12 capsule 1   furosemide (LASIX) 20 MG tablet Take 1 tablet (20 mg total) by mouth daily. 90 tablet 3   ipratropium-albuterol (DUONEB) 0.5-2.5 (3) MG/3ML SOLN Take 3 mls by nebulization 4 (four) times daily for 30 days 360 mL 1   medroxyPROGESTERone (DEPO-PROVERA) 150 MG/ML injection Inject into the muscle.     metoprolol succinate (TOPROL-XL) 50 MG 24 hr tablet Take 1 tablet (50 mg total) by mouth daily. 90 tablet 3   nicotine (NICODERM CQ - DOSED IN MG/24 HOURS) 14 mg/24hr patch Place 1 patch onto the skin daily for 14 days 28 patch 0   nitroGLYCERIN (NITROSTAT) 0.4 MG SL tablet Place 1 tablet (0.4 mg total) under the tongue every 5 (five) minutes  as needed for chest pain. 50 tablet 0   potassium chloride SA (KLOR-CON M) 20 MEQ tablet Take 2 tablets (40 mEq total) by mouth daily. 180 tablet 3   prasugrel (EFFIENT) 10 MG TABS tablet Take 1 tablet (10 mg total) by mouth daily. 90 tablet 3   rosuvastatin (CRESTOR) 40 MG tablet Take 1 tablet (40 mg total) by mouth daily. 90 tablet 3   spironolactone (ALDACTONE) 25 MG tablet Take 0.5 tablets (12.5 mg total) by mouth daily. 45 tablet 3   No current facility-administered medications for this visit.    Allergies:   Buspirone, Citalopram, Metrizamide, Sulfa antibiotics, Contrast media [iodinated contrast media], Sulfa drugs cross reactors, and Sulfasalazine    Social History:  The patient  reports that she quit smoking about 3 months ago. Her smoking use included cigarettes. She has never used smokeless tobacco. She reports that she does not drink alcohol and does not  use drugs.   Family History:  The patient's family history includes Breast cancer in her maternal grandmother; Cancer in her father and paternal grandfather; Depression in her mother; Hypertension in her sister.    ROS:  Please see the history of present illness.   Otherwise, review of systems are positive for none.   All other systems are reviewed and negative.    PHYSICAL EXAM: VS:  BP 110/70 (BP Location: Left Arm, Patient Position: Sitting, Cuff Size: Normal)   Pulse (!) 116   Ht 5' 1"$  (1.549 m)   Wt 174 lb 2 oz (79 kg)   SpO2 99%   BMI 32.90 kg/m  , BMI Body mass index is 32.9 kg/m. GEN: Well nourished, well developed, in no acute distress  HEENT: normal  Neck: no JVD, carotid bruits, or masses Cardiac: RRR; no murmurs, rubs, or gallops,no edema  Respiratory:  clear to auscultation bilaterally, normal work of breathing GI: soft, nontender, nondistended, + BS MS: no deformity or atrophy  Skin: warm and dry, no rash Neuro:  Strength and sensation are intact Psych: euthymic mood, full affect Radial pulses normal bilaterally.   EKG:  EKG is ordered today. EKG showed sinus tachycardia with prior inferior infarct.  Ventricular bigeminy   Recent Labs: 09/05/2021: ALT 19 03/13/2022: B Natriuretic Peptide 92.8; Magnesium 2.0 06/02/2022: BUN 7; Creatinine, Ser 0.84; Hemoglobin 13.9; Platelets 343; Potassium 3.2; Sodium 140    Lipid Panel    Component Value Date/Time   CHOL 201 (H) 03/14/2022 0148   CHOL 280 (H) 11/29/2011 1326   TRIG 180 (H) 03/14/2022 0148   TRIG 186 11/29/2011 1326   HDL 28 (L) 03/14/2022 0148   HDL 29 (L) 11/29/2011 1326   CHOLHDL 7.2 03/14/2022 0148   VLDL 36 03/14/2022 0148   VLDL 37 11/29/2011 1326   LDLCALC 137 (H) 03/14/2022 0148   LDLCALC 214 (H) 11/29/2011 1326      Wt Readings from Last 3 Encounters:  06/23/22 174 lb 2 oz (79 kg)  06/02/22 176 lb 5.9 oz (80 kg)  04/25/22 176 lb 6.4 oz (80 kg)          10/26/2021    3:07 PM  PAD  Screen  Previous PAD dx? No  Previous surgical procedure? No  Pain with walking? No  Feet/toe relief with dangling? No  Painful, non-healing ulcers? No  Extremities discolored? No      ASSESSMENT AND PLAN:  1.  Coronary artery disease involving native coronary arteries with unstable angina: Unfortunately, the patient is having chest tightness at rest  that has been persistent since yesterday.  This is very concerning given her known history of previous inferior MI and very late stent thrombosis.  She continues to be on dual antiplatelet therapy with aspirin and prasugrel.   Her EKG today shows ventricular bigeminy and she is tachycardic. I recommend transferring the patient to the ED with anticipated hospital admission.  Recommend starting heparin drip and plan on left heart catheterization on Monday or earlier if she becomes unstable.   2.  Chronic systolic heart failure: Most recent ejection fraction was 35 to 40%.  Continue Toprol 50 mg once daily.  She reports that Wilder Glade was not covered by her insurance.  She is not on Entresto due to low blood pressure but we might need to consider a small dose ARB.  However, the priority might be to try to increase the dose of Toprol given her resting tachycardia.  3.  Paroxysmal supraventricular tachycardia: She is mostly having issues with sinus tachycardia at the present time.  Continue Toprol 50 mg once daily.  Consider ivabradine  4.  Hyperlipidemia: Continue rosuvastatin.  5.  Prior tobacco use: She quit smoking after her most recent myocardial infarction with no relapse.  6.  Ventricular bigeminy: Will check electrolytes as she has known history of hypokalemia that possibly worsened in the setting of recent GI symptoms and diarrhea.  In addition, this might be due to an ischemic etiology.   Disposition: Transfer to the ED for evaluation with intention of admission.  Recommend starting heparin drip with anticipation of cardiac  catheterization on Monday.  Signed,  Kathlyn Sacramento, MD  06/23/2022 3:19 PM    [place]

## 2022-06-23 NOTE — ED Triage Notes (Signed)
  Pt comes with c/o cp that started this am. Pt states hx of NSTEMI. Pt is on thinners. Pt states left side. Pt stats some sob.

## 2022-06-23 NOTE — Telephone Encounter (Signed)
Add her to my schedule today at 2:40 pm

## 2022-06-23 NOTE — Telephone Encounter (Signed)
Patient called again to follow-up on next steps.

## 2022-06-23 NOTE — H&P (View-Only) (Signed)
Cardiology Office Note   Date:  06/23/2022   ID:  Lisa Davila, DOB [birth year], MRN [id number]  PCP:  Tracie Harrier, MD  Cardiologist:   Kathlyn Sacramento, MD   Chief Complaint  Patient presents with   OTHER    Chest pain/sob. Pt c/o being prescribed 2 new med's but never receiving new Rx to have them filled. Please advise. Meds reviewed verbally with pt.      History of Present Illness: Lisa Davila is a 47 y.o. female who is here today for a follow-up visit regarding coronary artery disease and chronic systolic heart failure.  She has known history of SVT status post previous EP study with no inducible arrhythmia, essential hypertension, hyperlipidemia, fibromyalgia, tobacco use, anxiety and hypokalemia. She presented in February 2023 with chest pain and was found to have inferior ST elevation.  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  There was mild nonobstructive disease affecting the left coronary arteries.  Echocardiogram showed an EF of 50 to 55%.  Brilinta was switched to clopidogrel due to persistent dyspnea. She had COVID-19 infection in May.  She had recurrent chest pain after that we will multiple emergency room visits. Repeat echocardiogram in April showed an EF of 35%.  Due to a drop in her ejection fraction and her symptoms, right and left cardiac catheterization was done in July, 2023 which showed patent RCA stent with mild in-stent restenosis and no evidence of obstructive disease affecting the left coronary arteries.  Ejection fraction was 35%.  Right heart catheterization showed mildly to moderately elevated filling pressures, minimal pulmonary hypertension and normal cardiac output.   She presented in November, 2023 with recurrent myocardial infarction.  Cardiac catheterization showed occluded RCA  stent due to late stent thrombosis.  She underwent successful mechanical thrombectomy and drug eluting stent placement.  She was switched from clopidogrel to prasugrel.  Genotype testing subsequently showed intermediate [id number] metabolic activity. Echocardiogram showed an EF of 35 to 40%.  She quit smoking since her second myocardial infarction.    She was diagnosed with COVID-19 last week and had symptoms that included vomiting, diarrhea, fever and bodyaches.  Shortly after that, she started having substernal chest tightness but the episodes initially were short-lived.  However, she had a prolonged episode yesterday that lasted more than 30 minutes and was very similar to her most recent myocardial infarction.  She has not felt well since then and she continues to have chest tightness today.   Past Medical History:  Diagnosis Date   Acute ethmoidal sinusitis    Acute ethmoidal sinusitis    Acute maxillary sinusitis    Acute upper respiratory infections of unspecified site    Anxiety state, unspecified    Chest pain, unspecified    CHF (congestive heart failure) ([facility])    Coronary artery disease    Depression    Diarrhea    Esophageal reflux    Generalized anxiety disorder    Hyperlipidemia    Hypertension    Hypoglycemia, unspecified    Lumbago    Nausea with vomiting    Obstructive chronic bronchitis with exacerbation (HCC)    Other and unspecified hyperlipidemia    Other and unspecified peripheral vertigo(386.19)    Other bursitis disorders    Other malaise and fatigue    Other specified diseases due to viruses    Other vitamin B12 deficiency  anemia    Pain in limb    Pernicious anemia    Sleep related hypoventilation/hypoxemia in conditions classifiable elsewhere    SVT (supraventricular tachycardia)    Swelling, mass, or lump in head and neck    Tobacco use disorder    Unspecified disorder of external ear    Unspecified disorder of skin and subcutaneous tissue     Unspecified otitis media     Past Surgical History:  Procedure Laterality Date   CESAREAN SECTION     CORONARY STENT INTERVENTION N/A 03/14/2022   Procedure: CORONARY STENT INTERVENTION;  Surgeon: Nelva Bush, MD;  Location: [facility];  Service: Cardiovascular;  Laterality: N/A;   CORONARY THROMBECTOMY N/A 03/14/2022   Procedure: Coronary Thrombectomy;  Surgeon: Nelva Bush, MD;  Location: [facility];  Service: Cardiovascular;  Laterality: N/A;   CORONARY/GRAFT ACUTE MI REVASCULARIZATION N/A 07/04/2021   Procedure: Coronary/Graft Acute MI Revascularization;  Surgeon: Wellington Hampshire, MD;  Location: [facility];  Service: Cardiovascular;  Laterality: N/A;   LEFT HEART CATH AND CORONARY ANGIOGRAPHY N/A 07/04/2021   Procedure: LEFT HEART CATH AND CORONARY ANGIOGRAPHY;  Surgeon: Wellington Hampshire, MD;  Location: [facility];  Service: Cardiovascular;  Laterality: N/A;   LEFT HEART CATH AND CORONARY ANGIOGRAPHY N/A 03/14/2022   Procedure: LEFT HEART CATH AND CORONARY ANGIOGRAPHY;  Surgeon: Nelva Bush, MD;  Location: [facility];  Service: Cardiovascular;  Laterality: N/A;   RIGHT/LEFT HEART CATH AND CORONARY ANGIOGRAPHY N/A 11/07/2021   Procedure: RIGHT/LEFT HEART CATH AND CORONARY ANGIOGRAPHY;  Surgeon: Wellington Hampshire, MD;  Location: [facility];  Service: Cardiovascular;  Laterality: N/A;     Current Outpatient Medications  Medication Sig Dispense Refill   albuterol (VENTOLIN HFA) 108 (90 Base) MCG/ACT inhaler Inhale 2 puffs into the lungs every 6 hours as needed for wheezing. 18 g 5   ALPRAZolam (XANAX) 0.5 MG tablet Take 1 tablet (0.5 mg total) by mouth 3 (three) times daily as needed for Sleep 90 tablet 2   aspirin 81 MG chewable tablet Chew 1 tablet (81 mg total) by mouth daily.     chlorpheniramine-HYDROcodone (TUSSIONEX) 10-8 MG/5ML Take 5 mLs by mouth every 12 (twelve) hours as needed for up to 7 days 70 mL 0    Cholecalciferol 1.25 MG (50000 UT) TABS Take 1.25 tablets by mouth once a week.     cyanocobalamin (VITAMIN B12) 1000 MCG tablet Take 1 tablet (1,000 mcg total) by mouth once daily 90 tablet 1   DULoxetine (CYMBALTA) 30 MG capsule Take 1 capsule (30 mg total) by mouth once daily 90 capsule 1   ergocalciferol (VITAMIN D2) 1.25 MG (50000 UT) capsule Take one capsule by mouth once a week 12 capsule 1   furosemide (LASIX) 20 MG tablet Take 1 tablet (20 mg total) by mouth daily. 90 tablet 3   ipratropium-albuterol (DUONEB) 0.5-2.5 (3) MG/3ML SOLN Take 3 mls by nebulization 4 (four) times daily for 30 days 360 mL 1   medroxyPROGESTERone (DEPO-PROVERA) 150 MG/ML injection Inject into the muscle.     metoprolol succinate (TOPROL-XL) 50 MG 24 hr tablet Take 1 tablet (50 mg total) by mouth daily. 90 tablet 3   nicotine (NICODERM CQ - DOSED IN MG/24 HOURS) 14 mg/24hr patch Place 1 patch onto the skin daily for 14 days 28 patch 0   nitroGLYCERIN (NITROSTAT) 0.4 MG SL tablet Place 1 tablet (0.4 mg total) under the tongue every 5 (five) minutes  as needed for chest pain. 50 tablet 0   potassium chloride SA (KLOR-CON M) 20 MEQ tablet Take 2 tablets (40 mEq total) by mouth daily. 180 tablet 3   prasugrel (EFFIENT) 10 MG TABS tablet Take 1 tablet (10 mg total) by mouth daily. 90 tablet 3   rosuvastatin (CRESTOR) 40 MG tablet Take 1 tablet (40 mg total) by mouth daily. 90 tablet 3   spironolactone (ALDACTONE) 25 MG tablet Take 0.5 tablets (12.5 mg total) by mouth daily. 45 tablet 3   No current facility-administered medications for this visit.    Allergies:   Buspirone, Citalopram, Metrizamide, Sulfa antibiotics, Contrast media [iodinated contrast media], Sulfa drugs cross reactors, and Sulfasalazine    Social History:  The patient  reports that she quit smoking about 3 months ago. Her smoking use included cigarettes. She has never used smokeless tobacco. She reports that she does not drink alcohol and does not  use drugs.   Family History:  The patient's family history includes Breast cancer in her maternal grandmother; Cancer in her father and paternal grandfather; Depression in her mother; Hypertension in her sister.    ROS:  Please see the history of present illness.   Otherwise, review of systems are positive for none.   All other systems are reviewed and negative.    PHYSICAL EXAM: VS:  BP 110/70 (BP Location: Left Arm, Patient Position: Sitting, Cuff Size: Normal)   Pulse (!) 116   Ht 5' 1"$  (1.549 m)   Wt 174 lb 2 oz (79 kg)   SpO2 99%   BMI 32.90 kg/m  , BMI Body mass index is 32.9 kg/m. GEN: Well nourished, well developed, in no acute distress  HEENT: normal  Neck: no JVD, carotid bruits, or masses Cardiac: RRR; no murmurs, rubs, or gallops,no edema  Respiratory:  clear to auscultation bilaterally, normal work of breathing GI: soft, nontender, nondistended, + BS MS: no deformity or atrophy  Skin: warm and dry, no rash Neuro:  Strength and sensation are intact Psych: euthymic mood, full affect Radial pulses normal bilaterally.   EKG:  EKG is ordered today. EKG showed sinus tachycardia with prior inferior infarct.  Ventricular bigeminy   Recent Labs: 09/05/2021: ALT 19 03/13/2022: B Natriuretic Peptide 92.8; Magnesium 2.0 06/02/2022: BUN 7; Creatinine, Ser 0.84; Hemoglobin 13.9; Platelets 343; Potassium 3.2; Sodium 140    Lipid Panel    Component Value Date/Time   CHOL 201 (H) 03/14/2022 0148   CHOL 280 (H) 11/29/2011 1326   TRIG 180 (H) 03/14/2022 0148   TRIG 186 11/29/2011 1326   HDL 28 (L) 03/14/2022 0148   HDL 29 (L) 11/29/2011 1326   CHOLHDL 7.2 03/14/2022 0148   VLDL 36 03/14/2022 0148   VLDL 37 11/29/2011 1326   LDLCALC 137 (H) 03/14/2022 0148   LDLCALC 214 (H) 11/29/2011 1326      Wt Readings from Last 3 Encounters:  06/23/22 174 lb 2 oz (79 kg)  06/02/22 176 lb 5.9 oz (80 kg)  04/25/22 176 lb 6.4 oz (80 kg)          10/26/2021    3:07 PM  PAD  Screen  Previous PAD dx? No  Previous surgical procedure? No  Pain with walking? No  Feet/toe relief with dangling? No  Painful, non-healing ulcers? No  Extremities discolored? No      ASSESSMENT AND PLAN:  1.  Coronary artery disease involving native coronary arteries with unstable angina: Unfortunately, the patient is having chest tightness at rest  that has been persistent since yesterday.  This is very concerning given her known history of previous inferior MI and very late stent thrombosis.  She continues to be on dual antiplatelet therapy with aspirin and prasugrel.   Her EKG today shows ventricular bigeminy and she is tachycardic. I recommend transferring the patient to the ED with anticipated hospital admission.  Recommend starting heparin drip and plan on left heart catheterization on Monday or earlier if she becomes unstable.   2.  Chronic systolic heart failure: Most recent ejection fraction was 35 to 40%.  Continue Toprol 50 mg once daily.  She reports that Wilder Glade was not covered by her insurance.  She is not on Entresto due to low blood pressure but we might need to consider a small dose ARB.  However, the priority might be to try to increase the dose of Toprol given her resting tachycardia.  3.  Paroxysmal supraventricular tachycardia: She is mostly having issues with sinus tachycardia at the present time.  Continue Toprol 50 mg once daily.  Consider ivabradine  4.  Hyperlipidemia: Continue rosuvastatin.  5.  Prior tobacco use: She quit smoking after her most recent myocardial infarction with no relapse.  6.  Ventricular bigeminy: Will check electrolytes as she has known history of hypokalemia that possibly worsened in the setting of recent GI symptoms and diarrhea.  In addition, this might be due to an ischemic etiology.   Disposition: Transfer to the ED for evaluation with intention of admission.  Recommend starting heparin drip with anticipation of cardiac  catheterization on Monday.  Signed,  Kathlyn Sacramento, MD  06/23/2022 3:19 PM    [place]

## 2022-06-24 ENCOUNTER — Other Ambulatory Visit: Payer: Self-pay | Admitting: Physician Assistant

## 2022-06-24 ENCOUNTER — Telehealth: Payer: Self-pay | Admitting: Physician Assistant

## 2022-06-24 DIAGNOSIS — Z91041 Radiographic dye allergy status: Secondary | ICD-10-CM

## 2022-06-24 MED ORDER — PREDNISONE 50 MG PO TABS: ORAL_TABLET | ORAL | 0 refills | Status: DC

## 2022-06-24 NOTE — Telephone Encounter (Signed)
Spoke with patient, she has continued to have some intermittent chest tightness.  After discussing the case with the patient's primary cardiologist, we will proceed with cardiac cath on Monday, 06/26/2022.  Patient was advised to arrive in specials at 10:30 AM.  She does have a contrast allergy and will be premedicated with prednisone 50 mg starting at 2330 on 2/18 with her next dose being at 0530 on 2/19 and her last dose on 2/19 at 1130.  She will also take 50 mg of Benadryl at 1130 AM.  Her last dose of prednisone and Benadryl will need to be administered in the specials.  She was advised to hold furosemide on the day of her cardiac cath.  She was advised to take an additional 40 mEq of KCl today.  Shared Decision Making/Informed Consent{  The risks [stroke (1 in 1000), death (1 in 1000), kidney failure [usually temporary] (1 in 500), bleeding (1 in 200), allergic reaction [possibly serious] (1 in 200)], benefits (diagnostic support and management of coronary artery disease) and alternatives of a cardiac catheterization were discussed in detail with Lisa Davila and she is willing to proceed.

## 2022-06-24 NOTE — Progress Notes (Signed)
Prednisone 50 mg #3 was sent into the patient's pharmacy ([facility] in [place]) for contrast allergy premedication.

## 2022-06-26 ENCOUNTER — Other Ambulatory Visit: Payer: Self-pay

## 2022-06-26 ENCOUNTER — Encounter
Admission: RE | Disposition: A | Discharge status: Home / Self Care | Payer: Self-pay | Source: Ambulatory Visit | Attending: Cardiovascular Disease

## 2022-06-26 ENCOUNTER — Encounter: Payer: Self-pay | Admitting: Cardiovascular Disease

## 2022-06-26 ENCOUNTER — Ambulatory Visit
Admission: RE | Admit: 2022-06-26 | Discharge: 2022-06-26 | Disposition: A | Discharge status: Home / Self Care | Payer: 59 | Source: Ambulatory Visit | Attending: Cardiovascular Disease | Admitting: Cardiovascular Disease

## 2022-06-26 DIAGNOSIS — R008 Other abnormalities of heart beat: Secondary | ICD-10-CM | POA: Diagnosis not present

## 2022-06-26 DIAGNOSIS — I25110 Atherosclerotic heart disease of native coronary artery with unstable angina pectoris: Secondary | ICD-10-CM | POA: Diagnosis not present

## 2022-06-26 DIAGNOSIS — Z7982 Long term (current) use of aspirin: Secondary | ICD-10-CM | POA: Insufficient documentation

## 2022-06-26 DIAGNOSIS — Z79899 Other long term (current) drug therapy: Secondary | ICD-10-CM | POA: Diagnosis not present

## 2022-06-26 DIAGNOSIS — I4719 Other supraventricular tachycardia: Secondary | ICD-10-CM | POA: Diagnosis not present

## 2022-06-26 DIAGNOSIS — I110 Hypertensive heart disease with heart failure: Secondary | ICD-10-CM | POA: Diagnosis not present

## 2022-06-26 DIAGNOSIS — Z7902 Long term (current) use of antithrombotics/antiplatelets: Secondary | ICD-10-CM | POA: Diagnosis not present

## 2022-06-26 DIAGNOSIS — I5022 Chronic systolic (congestive) heart failure: Secondary | ICD-10-CM | POA: Insufficient documentation

## 2022-06-26 DIAGNOSIS — I252 Old myocardial infarction: Secondary | ICD-10-CM | POA: Insufficient documentation

## 2022-06-26 DIAGNOSIS — Z955 Presence of coronary angioplasty implant and graft: Secondary | ICD-10-CM | POA: Insufficient documentation

## 2022-06-26 DIAGNOSIS — Z87891 Personal history of nicotine dependence: Secondary | ICD-10-CM | POA: Insufficient documentation

## 2022-06-26 HISTORY — PX: LEFT HEART CATH AND CORONARY ANGIOGRAPHY: CATH118249

## 2022-06-26 SURGERY — LEFT HEART CATH AND CORONARY ANGIOGRAPHY
Anesthesia: Moderate Sedation

## 2022-06-26 MED ORDER — MIDAZOLAM HCL 2 MG/2ML IJ SOLN
INTRAMUSCULAR | Status: DC | PRN
  Administered 2022-06-26: 2 mg via INTRAVENOUS

## 2022-06-26 MED ORDER — METOPROLOL SUCCINATE ER 100 MG PO TB24: 100.0000 mg | ORAL_TABLET | Freq: Every day | ORAL | 3 refills | Status: DC

## 2022-06-26 MED ORDER — METHYLPREDNISOLONE SODIUM SUCC 125 MG IJ SOLR: 125.0000 mg | Freq: Once | INTRAMUSCULAR | Status: DC

## 2022-06-26 MED ORDER — IOHEXOL 300 MG/ML  SOLN
INTRAMUSCULAR | Status: DC | PRN
  Administered 2022-06-26: 41 mL

## 2022-06-26 MED ORDER — ACETAMINOPHEN 325 MG PO TABS: 650.0000 mg | ORAL_TABLET | ORAL | Status: DC | PRN

## 2022-06-26 MED ORDER — SODIUM CHLORIDE 0.9 % IV SOLN
250.0000 mL | INTRAVENOUS | Status: DC | PRN
  Administered 2022-06-26: 1000 mL via INTRAVENOUS

## 2022-06-26 MED ORDER — SODIUM CHLORIDE 0.9% FLUSH: 3.0000 mL | Freq: Two times a day (BID) | INTRAVENOUS | Status: DC

## 2022-06-26 MED ORDER — HEPARIN SODIUM (PORCINE) 1000 UNIT/ML IJ SOLN
INTRAMUSCULAR | Status: AC
  Filled 2022-06-26: qty 10

## 2022-06-26 MED ORDER — ASPIRIN 81 MG PO CHEW: 81.0000 mg | CHEWABLE_TABLET | ORAL | Status: DC

## 2022-06-26 MED ORDER — SODIUM CHLORIDE 0.9% FLUSH: 3.0000 mL | INTRAVENOUS | Status: DC | PRN

## 2022-06-26 MED ORDER — VERAPAMIL HCL 2.5 MG/ML IV SOLN
INTRAVENOUS | Status: AC
  Filled 2022-06-26: qty 2

## 2022-06-26 MED ORDER — DIPHENHYDRAMINE HCL 50 MG/ML IJ SOLN
50.0000 mg | Freq: Once | INTRAMUSCULAR | Status: AC
  Administered 2022-06-26: 50 mg via INTRAVENOUS

## 2022-06-26 MED ORDER — HEPARIN (PORCINE) IN NACL 1000-0.9 UT/500ML-% IV SOLN
INTRAVENOUS | Status: AC
  Filled 2022-06-26: qty 1000

## 2022-06-26 MED ORDER — VERAPAMIL HCL 2.5 MG/ML IV SOLN
INTRAVENOUS | Status: DC | PRN
  Administered 2022-06-26: 2.5 mg via INTRA_ARTERIAL

## 2022-06-26 MED ORDER — PREDNISONE 50 MG PO TABS: 50.0000 mg | ORAL_TABLET | Freq: Four times a day (QID) | ORAL | Status: DC

## 2022-06-26 MED ORDER — SODIUM CHLORIDE 0.9 % IV SOLN: INTRAVENOUS | Status: DC

## 2022-06-26 MED ORDER — SODIUM CHLORIDE 0.9 % IV SOLN: INTRAVENOUS | Status: AC

## 2022-06-26 MED ORDER — FENTANYL CITRATE (PF) 100 MCG/2ML IJ SOLN
INTRAMUSCULAR | Status: DC | PRN
  Administered 2022-06-26: 25 ug via INTRAVENOUS

## 2022-06-26 MED ORDER — DIPHENHYDRAMINE HCL 50 MG/ML IJ SOLN
INTRAMUSCULAR | Status: AC
  Filled 2022-06-26: qty 1

## 2022-06-26 MED ORDER — DIPHENHYDRAMINE HCL 25 MG PO CAPS: 50.0000 mg | ORAL_CAPSULE | Freq: Once | ORAL | Status: AC

## 2022-06-26 MED ORDER — METOPROLOL SUCCINATE ER 100 MG PO TB24
100.0000 mg | ORAL_TABLET | Freq: Every day | ORAL | 3 refills | Status: DC
  Filled 2022-06-26: qty 90, 90d supply, fill #0

## 2022-06-26 MED ORDER — ONDANSETRON HCL 4 MG/2ML IJ SOLN: 4.0000 mg | Freq: Four times a day (QID) | INTRAMUSCULAR | Status: DC | PRN

## 2022-06-26 MED ORDER — MIDAZOLAM HCL 2 MG/2ML IJ SOLN
INTRAMUSCULAR | Status: AC
  Filled 2022-06-26: qty 2

## 2022-06-26 MED ORDER — SODIUM CHLORIDE 0.9 % IV SOLN
INTRAVENOUS | Status: DC | PRN
  Administered 2022-06-26: 100 mL/h via INTRAVENOUS

## 2022-06-26 MED ORDER — FENTANYL CITRATE (PF) 100 MCG/2ML IJ SOLN
INTRAMUSCULAR | Status: AC
  Filled 2022-06-26: qty 2

## 2022-06-26 MED ORDER — HEPARIN (PORCINE) IN NACL 1000-0.9 UT/500ML-% IV SOLN
INTRAVENOUS | Status: DC | PRN
  Administered 2022-06-26 (×2): 500 mL

## 2022-06-26 MED ORDER — SODIUM CHLORIDE 0.9 % IV SOLN: 250.0000 mL | INTRAVENOUS | Status: DC | PRN

## 2022-06-26 MED ORDER — DIPHENHYDRAMINE HCL 50 MG/ML IJ SOLN: 50.0000 mg | Freq: Once | INTRAMUSCULAR | Status: DC

## 2022-06-26 MED ORDER — METHYLPREDNISOLONE SODIUM SUCC 125 MG IJ SOLR
INTRAMUSCULAR | Status: AC
  Administered 2022-06-26: 125 mg
  Filled 2022-06-26: qty 2

## 2022-06-26 MED ORDER — HEPARIN SODIUM (PORCINE) 1000 UNIT/ML IJ SOLN
INTRAMUSCULAR | Status: DC | PRN
  Administered 2022-06-26: 3500 [IU] via INTRAVENOUS

## 2022-06-26 SURGICAL SUPPLY — 12 items
BAND ZEPHYR COMPRESS 30 LONG (HEMOSTASIS) IMPLANT
CATH INFINITI 5 FR JL3.5 (CATHETERS) IMPLANT
CATH INFINITI 5FR JK (CATHETERS) IMPLANT
CATH INFINITI JR4 5F (CATHETERS) IMPLANT
DRAPE BRACHIAL (DRAPES) IMPLANT
GLIDESHEATH SLEND SS 6F .021 (SHEATH) IMPLANT
GUIDEWIRE INQWIRE 1.5J.035X260 (WIRE) IMPLANT
INQWIRE 1.5J .035X260CM (WIRE) ×1
PACK CARDIAC CATH (CUSTOM PROCEDURE TRAY) ×1 IMPLANT
PROTECTION STATION PRESSURIZED (MISCELLANEOUS) ×1
SET ATX SIMPLICITY (MISCELLANEOUS) IMPLANT
STATION PROTECTION PRESSURIZED (MISCELLANEOUS) IMPLANT

## 2022-06-26 NOTE — Interval H&P Note (Signed)
History and Physical Interval Note:  06/26/2022 11:19 AM  Lisa Davila  has presented today for surgery, with the diagnosis of unstable angina.  The various methods of treatment have been discussed with the patient and family. After consideration of risks, benefits and other options for treatment, the patient has consented to  Procedure(s): LEFT HEART CATH AND CORONARY ANGIOGRAPHY (N/A) as a surgical intervention.  The patient's history has been reviewed, patient examined, no change in status, stable for surgery.  I have reviewed the patient's chart and labs.  Questions were answered to the patient's satisfaction.     Kathlyn Sacramento

## 2022-06-27 ENCOUNTER — Encounter: Payer: Self-pay | Admitting: Cardiovascular Disease

## 2022-06-29 ENCOUNTER — Encounter: Payer: Self-pay | Admitting: Cardiovascular Disease

## 2022-06-29 NOTE — Telephone Encounter (Signed)
Called and spoke to the patient. She stated that since the Metoprolol Succinate has been increased to the 100 mg once daily, that her blood pressure and heart rate have dropped. Today it was 90/70 and heart rate was 82. She stated that at rest her heart rate is in the 50's and when she ambulates it will be in the 80's. Her systolic is not getting over the 90's.  She takes her Metoprolol at lunch daily. She wants to know if she can take 50 mg once daily again.

## 2022-07-06 ENCOUNTER — Other Ambulatory Visit: Payer: Self-pay

## 2022-07-06 ENCOUNTER — Other Ambulatory Visit: Payer: Self-pay | Admitting: Cardiovascular Disease

## 2022-07-06 MED ORDER — METOPROLOL SUCCINATE ER 50 MG PO TB24: 50.0000 mg | ORAL_TABLET | Freq: Every day | ORAL | 3 refills | Status: DC

## 2022-07-06 NOTE — Telephone Encounter (Signed)
Pharmacy requesting new Rx. Ok to send as '50MG'$ ?  Was filled as '100MG'$   Receipt confirmed by pharmacy (06/26/2022 12:05 PM EST)?

## 2022-07-10 ENCOUNTER — Other Ambulatory Visit: Payer: Self-pay | Admitting: Cardiovascular Disease

## 2022-07-10 ENCOUNTER — Other Ambulatory Visit: Payer: Self-pay

## 2022-07-10 MED ORDER — METOPROLOL SUCCINATE ER 50 MG PO TB24
50.0000 mg | ORAL_TABLET | Freq: Every day | ORAL | 3 refills | Status: DC
  Filled 2022-07-10: qty 30, 30d supply, fill #0
  Filled 2022-08-10: qty 23, 23d supply, fill #1
  Filled 2022-08-10: qty 7, 7d supply, fill #1
  Filled 2022-09-12: qty 30, 30d supply, fill #2
  Filled 2022-10-10: qty 30, 30d supply, fill #3

## 2022-07-11 ENCOUNTER — Other Ambulatory Visit: Payer: Self-pay | Admitting: Cardiovascular Disease

## 2022-07-11 ENCOUNTER — Other Ambulatory Visit: Payer: Self-pay

## 2022-07-15 ENCOUNTER — Ambulatory Visit
Admission: EM | Admit: 2022-07-15 | Discharge: 2022-07-15 | Disposition: A | Discharge status: Home / Self Care | Payer: 59 | Attending: Emergency Medicine | Admitting: Emergency Medicine

## 2022-07-15 DIAGNOSIS — R21 Rash and other nonspecific skin eruption: Secondary | ICD-10-CM

## 2022-07-15 MED ORDER — PREDNISONE 10 MG (21) PO TBPK: ORAL_TABLET | Freq: Every day | ORAL | 0 refills | Status: DC

## 2022-07-15 NOTE — Discharge Instructions (Addendum)
Take the prednisone as directed.    Take Benadryl or Zyrtec as directed.    Follow up with your primary care provider.

## 2022-07-15 NOTE — ED Triage Notes (Signed)
Patient to Urgent Care with complaints of rash that started yesterday. Started on abdomen. Rash has spread to thighs/ legs/ arms.   Denies any known exposure to allergens. Taking benadryl and using benadryl cream. States her skin feels like it's burning/ itching.

## 2022-07-15 NOTE — ED Provider Notes (Signed)
Lisa Davila    CSN: [id number] Arrival date & time: 07/15/22  1439      History   Chief Complaint Chief Complaint  Patient presents with   Rash    HPI Lisa Davila is a 47 y.o. female. Patient presents with 1 day history of pruritic tender rash on her trunk and extremities.  The rash started on her abdomen and has spread to her extremities.  No new products, medications, foods.  Treatment attempted with Benadryl and Benadryl cream.  She denies fever, chills,sore throat, shortness of breath, or other symptoms.     The history is provided by the patient and medical records.    Past Medical History:  Diagnosis Date   Acute ethmoidal sinusitis    Acute ethmoidal sinusitis    Acute maxillary sinusitis    Acute upper respiratory infections of unspecified site    Anxiety state, unspecified    Chest pain, unspecified    CHF (congestive heart failure) ([place])    Coronary artery disease    Depression    Diarrhea    Esophageal reflux    Generalized anxiety disorder    Hyperlipidemia    Hypertension    Hypoglycemia, unspecified    Lumbago    Nausea with vomiting    Obstructive chronic bronchitis with exacerbation ([place])    Other and unspecified hyperlipidemia    Other and unspecified peripheral vertigo(386.19)    Other bursitis disorders    Other malaise and fatigue    Other specified diseases due to viruses    Other vitamin B12 deficiency anemia    Pain in limb    Pernicious anemia    Sleep related hypoventilation/hypoxemia in conditions classifiable elsewhere    SVT (supraventricular tachycardia)    Swelling, mass, or lump in head and neck    Tobacco use disorder    Unspecified disorder of external ear    Unspecified disorder of skin and subcutaneous tissue    Unspecified otitis media     Patient Active Problem List   Diagnosis Date Noted   NSTEMI (non-ST elevated myocardial infarction) ([place]) 03/13/2022   Hypotension 0000000   Chronic systolic heart  failure ([place])    Coronary artery disease    ST elevation myocardial infarction (STEMI) of inferior wall ([place]) 07/04/2021   SVT (supraventricular tachycardia) 07/04/2021   Depression with anxiety 07/04/2021   Hyperlipidemia 07/04/2021   Tobacco user 07/04/2021   Leukocytosis 07/04/2021   Diarrhea 07/04/2021   Hx of cervical cancer 06/01/2020   Hypertension 06/01/2020   Migraines 06/01/2020   Sialolithiasis 06/01/2020   Tobacco use disorder 06/01/2020   Hoarseness 04/06/2020   Oral thrush 04/06/2020   Leg cramping 03/25/2020   Iatrogenic adrenal insufficiency ([place]) 02/20/2020   Hypokalemia 01/25/2020   Weakness of both legs 01/25/2020   Nontraumatic incomplete tear of left rotator cuff 01/20/2019   Tendinitis of upper biceps tendon of left shoulder 01/20/2019   Rotator cuff tendinitis, left 12/30/2018   Palpitation 01/08/2018   Hypercholesteremia 01/08/2018   Stress at work 01/08/2018   Trochanteric bursitis of left hip 11/30/2016   Benzodiazepine misuse 05/03/2016   Cervical cancer screening 05/03/2016   Chronic sinusitis 03/16/2015   Nasal obstruction 03/16/2015   High risk medication use 11/03/2014   Other long term (current) drug therapy 11/03/2014   Polypharmacy 11/03/2014   Arthralgia of temporomandibular joint, unspecified side 10/24/2013   Carpal tunnel syndrome 08/04/2013   Lateral epicondylitis 08/04/2013   Knee pain 02/03/2013   Muscle strain 02/03/2013  Pain in left knee 02/03/2013   Shoulder pain 02/03/2013   Dysphagia, pharyngeal 09/11/2012   Cardiac arrhythmia, unspecified 07/29/2012   Contraceptive management 07/29/2012   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   Contraception 06/18/2012   Fatigue 06/18/2012   Stye 03/21/2012   Nausea 02/23/2012   Fibromyalgia 01/17/2012   Myalgia and myositis 01/17/2012   Deficiency of other specified B group vitamins 07/19/2011   Depression 07/19/2011   Gastro-esophageal reflux disease without  esophagitis 07/19/2011   Generalized anxiety disorder 07/19/2011   Low back pain 07/19/2011    Past Surgical History:  Procedure Laterality Date   CESAREAN SECTION     CORONARY STENT INTERVENTION N/A 03/14/2022   Procedure: CORONARY STENT INTERVENTION;  Surgeon: Nelva Bush, MD;  Location: [facility];  Service: Cardiovascular;  Laterality: N/A;   CORONARY THROMBECTOMY N/A 03/14/2022   Procedure: Coronary Thrombectomy;  Surgeon: Nelva Bush, MD;  Location: [facility];  Service: Cardiovascular;  Laterality: N/A;   CORONARY/GRAFT ACUTE MI REVASCULARIZATION N/A 07/04/2021   Procedure: Coronary/Graft Acute MI Revascularization;  Surgeon: Wellington Hampshire, MD;  Location: [facility];  Service: Cardiovascular;  Laterality: N/A;   LEFT HEART CATH AND CORONARY ANGIOGRAPHY N/A 07/04/2021   Procedure: LEFT HEART CATH AND CORONARY ANGIOGRAPHY;  Surgeon: Wellington Hampshire, MD;  Location: [facility];  Service: Cardiovascular;  Laterality: N/A;   LEFT HEART CATH AND CORONARY ANGIOGRAPHY N/A 03/14/2022   Procedure: LEFT HEART CATH AND CORONARY ANGIOGRAPHY;  Surgeon: Nelva Bush, MD;  Location: [facility];  Service: Cardiovascular;  Laterality: N/A;   LEFT HEART CATH AND CORONARY ANGIOGRAPHY N/A 06/26/2022   Procedure: LEFT HEART CATH AND CORONARY ANGIOGRAPHY;  Surgeon: Wellington Hampshire, MD;  Location: [facility];  Service: Cardiovascular;  Laterality: N/A;   RIGHT/LEFT HEART CATH AND CORONARY ANGIOGRAPHY N/A 11/07/2021   Procedure: RIGHT/LEFT HEART CATH AND CORONARY ANGIOGRAPHY;  Surgeon: Wellington Hampshire, MD;  Location: [facility];  Service: Cardiovascular;  Laterality: N/A;    OB History   No obstetric history on file.      Home Medications    Prior to Admission medications   Medication Sig Start Date End Date Taking? Authorizing Provider  predniSONE (STERAPRED UNI-PAK 21 TAB) 10 MG (21) TBPK tablet Take by mouth  daily. As directed 07/15/22  Yes Sharion Balloon, NP  albuterol (VENTOLIN HFA) 108 (90 Base) MCG/ACT inhaler Inhale 2 puffs into the lungs every 6 hours as needed for wheezing. 12/13/21     ALPRAZolam (XANAX) 0.5 MG tablet Take 1 tablet (0.5 mg total) by mouth 3 (three) times daily as needed for Sleep 01/27/22     ALPRAZolam (XANAX) 0.5 MG tablet Take 1 tablet (0.5 mg total) by mouth 3 (three) times daily as needed for sleep. Patient not taking: Reported on 06/23/2022 05/17/22     aspirin 81 MG chewable tablet Chew 1 tablet (81 mg total) by mouth daily. 07/06/21   Sidney Ace, MD  chlorpheniramine-HYDROcodone (TUSSIONEX) 10-8 MG/5ML Take 5 mLs by mouth every 12 (twelve) hours as needed for up to 7 days Patient not taking: Reported on 06/26/2022 04/12/22     Cholecalciferol 1.25 MG (50000 UT) TABS Take 1.25 tablets by mouth once a week.    [provider]  cyanocobalamin (VITAMIN B12) 1000 MCG tablet Take 1 tablet (1,000 mcg total) by mouth once daily 01/27/22     ergocalciferol (VITAMIN D2) 1.25 MG (50000 UT) capsule  Take one capsule by mouth once a week Patient not taking: Reported on 06/26/2022 09/12/21     ipratropium-albuterol (DUONEB) 0.5-2.5 (3) MG/3ML SOLN Take 3 mls by nebulization 4 (four) times daily for 30 days 07/29/21   Peggye Form, NP  metoprolol succinate (TOPROL-XL) 50 MG 24 hr tablet Take 1 tablet (50 mg total) by mouth daily. 07/06/22 07/01/23  Wellington Hampshire, MD  metoprolol succinate (TOPROL-XL) 50 MG 24 hr tablet Take 1 tablet (50 mg total) by mouth daily. 07/10/22   Wellington Hampshire, MD  nicotine (NICODERM CQ - DOSED IN MG/24 HOURS) 14 mg/24hr patch Place 1 patch onto the skin daily for 14 days 03/15/22   Ralene Muskrat B, MD  nitroGLYCERIN (NITROSTAT) 0.4 MG SL tablet Place 1 tablet (0.4 mg total) under the tongue every 5 (five) minutes as needed for chest pain. 03/15/22   Ralene Muskrat B, MD  potassium chloride SA (KLOR-CON M) 20 MEQ tablet Take 2 tablets (40 mEq  total) by mouth daily. 04/12/22   Gerrie Nordmann, NP  prasugrel (EFFIENT) 10 MG TABS tablet Take 1 tablet (10 mg total) by mouth daily. Patient not taking: Reported on 06/23/2022 05/15/22 05/10/23  Wellington Hampshire, MD  prasugrel (EFFIENT) 10 MG TABS tablet Take 1 tablet (10 mg total) by mouth daily. 05/16/22 05/11/23  Margie Billet, PA-C  rosuvastatin (CRESTOR) 40 MG tablet Take 1 tablet (40 mg total) by mouth daily. Patient not taking: Reported on 06/23/2022 05/15/22 05/10/23  Wellington Hampshire, MD  rosuvastatin (CRESTOR) 40 MG tablet Take 1 tablet (40 mg total) by mouth daily. 05/16/22 05/11/23  Margie Billet, PA-C  spironolactone (ALDACTONE) 25 MG tablet Take 0.5 tablets (12.5 mg total) by mouth daily. 04/25/22 04/20/23  Wellington Hampshire, MD    Family History Family History  Problem Relation Age of Onset   Depression Mother    Cancer Father        Father   Hypertension Sister    Breast cancer Maternal Grandmother    Cancer Paternal Grandfather        Breast     Social History Social History   Tobacco Use   Smoking status: Former    Years: 30.00    Types: Cigarettes    Quit date: 03/13/2022    Years since quitting: 0.3   Smokeless tobacco: Never   Tobacco comments:    Has been smoking for 30 years Wants to quit after she gets past all the heart stuff and changes in life.  Smoking 3 a day right now  Vaping Use   Vaping Use: Never used  Substance Use Topics   Alcohol use: No   Drug use: Never     Allergies   Buspirone, Citalopram, Metrizamide, Sulfa antibiotics, Contrast media [iodinated contrast media], Sulfa drugs cross reactors, and Sulfasalazine   Review of Systems Review of Systems  Constitutional:  Negative for chills and fever.  HENT:  Negative for ear pain and sore throat.   Respiratory:  Negative for cough and shortness of breath.   Cardiovascular:  Negative for chest pain and palpitations.  Gastrointestinal:  Negative for diarrhea and vomiting.  Skin:   Positive for rash.  All other systems reviewed and are negative.    Physical Exam Triage Vital Signs ED Triage Vitals  Enc Vitals Group     BP 07/15/22 1502 110/77     Pulse Rate 07/15/22 1451 90     Resp 07/15/22 1451 18     Temp  07/15/22 1451 98.2 F (36.8 C)     Temp src --      SpO2 07/15/22 1451 95 %     Weight --      Height --      Head Circumference --      Peak Flow --      Pain Score 07/15/22 1450 7     Pain Loc --      Pain Edu? --      Excl. in [place]? --    No data found.  Updated Vital Signs BP 110/77   Pulse 90   Temp 98.2 F (36.8 C)   Resp 18   SpO2 95%   Visual Acuity Right Eye Distance:   Left Eye Distance:   Bilateral Distance:    Right Eye Near:   Left Eye Near:    Bilateral Near:     Physical Exam Vitals and nursing note reviewed.  Constitutional:      General: She is not in acute distress.    Appearance: Normal appearance. She is well-developed. She is not ill-appearing.  HENT:     Right Ear: Tympanic membrane normal.     Left Ear: Tympanic membrane normal.     Nose: Nose normal.     Mouth/Throat:     Mouth: Mucous membranes are moist.     Pharynx: Oropharynx is clear.  Cardiovascular:     Rate and Rhythm: Normal rate and regular rhythm.     Heart sounds: Normal heart sounds.  Pulmonary:     Effort: Pulmonary effort is normal. No respiratory distress.     Breath sounds: Normal breath sounds.  Musculoskeletal:     Cervical back: Neck supple.  Skin:    General: Skin is warm and dry.     Findings: Rash present.     Comments: Erythematous maculopapular rash on abdomen and bilateral thighs.    Neurological:     Mental Status: She is alert.  Psychiatric:        Mood and Affect: Mood normal.        Behavior: Behavior normal.      UC Treatments / Results  Labs (all labs ordered are listed, but only abnormal results are displayed) Labs Reviewed - No data to display  EKG   Radiology No results  found.  Procedures Procedures (including critical care time)  Medications Ordered in UC Medications - No data to display  Initial Impression / Assessment and Plan / UC Course  I have reviewed the triage vital signs and the nursing notes.  Pertinent labs & imaging results that were available during my care of the patient were reviewed by me and considered in my medical decision making (see chart for details).    Rash.  Afebrile and vital signs are stable.  Treating with prednisone taper.  Discussed Benadryl or Zyrtec; precautions for drowsiness discussed.  Education provided on rash.  Instructed patient to follow-up with her PCP.  She agrees to plan of care.  Final Clinical Impressions(s) / UC Diagnoses   Final diagnoses:  Rash     Discharge Instructions      Take the prednisone as directed.    Take Benadryl or Zyrtec as directed.    Follow up with your primary care provider.        ED Prescriptions     Medication Sig Dispense Auth. Provider   predniSONE (STERAPRED UNI-PAK 21 TAB) 10 MG (21) TBPK tablet Take by mouth daily. As directed 21 tablet Hall Busing,  Fredderick Phenix, NP      PDMP not reviewed this encounter.   Sharion Balloon, NP 07/15/22 1530

## 2022-07-19 ENCOUNTER — Other Ambulatory Visit: Payer: Self-pay

## 2022-07-19 ENCOUNTER — Telehealth: Payer: Self-pay | Admitting: Cardiovascular Disease

## 2022-07-19 MED ORDER — ONDANSETRON 4 MG PO TBDP
4.0000 mg | ORAL_TABLET | Freq: Three times a day (TID) | ORAL | 0 refills | Status: DC | PRN
  Filled 2022-07-19: qty 18, 21d supply, fill #0

## 2022-07-19 NOTE — Telephone Encounter (Signed)
Patient called stating she had lab work done at her PCP's office today, she states her white blood count, red blood count and hemoglobin were high.  She just wants to make sure that's not going cause a blood clot, and wants to make sure everything is okay.

## 2022-07-19 NOTE — Telephone Encounter (Signed)
Pt c/o of Chest Pain: STAT if CP now or developed within 24 hours  1. Are you having CP right now? Yes   2. Are you experiencing any other symptoms (ex. SOB, nausea, vomiting, sweating)? Nausea and vomiting this morning   3. How long have you been experiencing CP? Tightness in chest started yesterday   4. Is your CP continuous or coming and going? Coming and going   5. Have you taken Nitroglycerin? yes  ?

## 2022-07-19 NOTE — Telephone Encounter (Signed)
Initial Assessment Questions   1. LOCATION: "Where does it hurt?" "I'm having chest tightness"  2. RADIATION: "Does the pain go anywhere else?" "No"  3. ONSET: "When did the chest pain begin?" (Minutes, hours or days) "Its started yesterday"  4. PATTERN: "Does the pain come and go, or has it been constant since it started?"  "Does it get worse with exertion?" "It comes and goes it doesn't really get better or worse"  5. DURATION: "How long does it last" (e.g., seconds, minutes, hours) "It lasts about 10-15 minutes"  6. SEVERITY: "How bad is the pain?"  (e.g., Scale 1-10; mild, moderate, or severe)    - MILD (1-3): doesn't interfere with normal activities    - MODERATE (4-7): interferes with normal activities or awakens from sleep    - SEVERE (8-10): excruciating pain, unable to do any normal activities   "Its about 4-5"  7. CARDIAC RISK FACTORS: "Do you have any history of heart problems or risk factors for heart disease?" (e.g., angina, prior heart attack; diabetes, high blood pressure, high cholesterol, smoker, or strong family history of heart disease) HTN, hyperlipidemia, SVT, CAD. [place]  8. PULMONARY RISK FACTORS: "Do you have any history of lung disease?"  (e.g., blood clots in lung, asthma, emphysema, birth control pills) Asthma  9. CAUSE: "What do you think is causing the chest pain?" "I'm not sure it just feels tight"  10. OTHER SYMPTOMS: "Do you have any other symptoms?" (e.g., dizziness, nausea, vomiting, sweating, fever, difficulty breathing, cough) "I'm having some shortness of breath and nausea and vomiting"  11. PREGNANCY: "Is there any chance you are pregnant?" "When was your last menstrual period?" N/A  Care Advice:  1: GO TO ED NOW: * You need to be seen in the Emergency Department. * Go to the ED at nearest [place] now. Drive carefully.  2: BRING MEDICINES: * Bring a list of your current medicines when you go to the Emergency Department (ER). * Bring  the pill bottles too. This will help the doctor (or NP/PA) to make certain you are taking the right medicines and the right dose.  3: NOTHING BY MOUTH: * Do not eat or drink anything for now.  4: CALL EMS IF: * Severe difficulty breathing occurs * Passes out or becomes too weak to stand * You become worse

## 2022-07-20 ENCOUNTER — Encounter: Payer: Self-pay | Admitting: Cardiovascular Disease

## 2022-07-21 NOTE — Telephone Encounter (Signed)
Attempted to reach the patient. Phone call was unable to be completed.

## 2022-07-21 NOTE — Telephone Encounter (Signed)
Attempted to reach the patient. Phone call was unable to be completed. Attempt made times 3.

## 2022-07-26 ENCOUNTER — Emergency Department: Payer: 59

## 2022-07-26 ENCOUNTER — Encounter: Payer: Self-pay | Admitting: Emergency Medicine

## 2022-07-26 ENCOUNTER — Emergency Department
Admission: EM | Admit: 2022-07-26 | Discharge: 2022-07-26 | Disposition: A | Discharge status: Home / Self Care | Payer: 59 | Attending: Emergency Medicine | Admitting: Emergency Medicine

## 2022-07-26 ENCOUNTER — Other Ambulatory Visit: Payer: Self-pay

## 2022-07-26 DIAGNOSIS — I10 Essential (primary) hypertension: Secondary | ICD-10-CM | POA: Insufficient documentation

## 2022-07-26 DIAGNOSIS — R079 Chest pain, unspecified: Secondary | ICD-10-CM | POA: Insufficient documentation

## 2022-07-26 DIAGNOSIS — E876 Hypokalemia: Secondary | ICD-10-CM | POA: Diagnosis not present

## 2022-07-26 DIAGNOSIS — R0789 Other chest pain: Secondary | ICD-10-CM | POA: Diagnosis not present

## 2022-07-26 DIAGNOSIS — R072 Precordial pain: Secondary | ICD-10-CM | POA: Diagnosis not present

## 2022-07-26 LAB — CBC
HCT: 44.9 % (ref 36.0–46.0)
Hemoglobin: 14.8 g/dL (ref 12.0–15.0)
MCH: 30.8 pg (ref 26.0–34.0)
MCHC: 33 g/dL (ref 30.0–36.0)
MCV: 93.3 fL (ref 80.0–100.0)
Platelets: 339 10*3/uL (ref 150–400)
RBC: 4.81 MIL/uL (ref 3.87–5.11)
RDW: 12.4 % (ref 11.5–15.5)
WBC: 10.6 10*3/uL — ABNORMAL HIGH (ref 4.0–10.5)
nRBC: 0 % (ref 0.0–0.2)

## 2022-07-26 LAB — TROPONIN I (HIGH SENSITIVITY): Troponin I (High Sensitivity): 5 ng/L (ref <18)

## 2022-07-26 LAB — BASIC METABOLIC PANEL
Anion gap: 5 (ref 5–15)
BUN: <5 mg/dL — ABNORMAL LOW (ref 6–20)
CO2: 27 mmol/L (ref 22–32)
Calcium: 9 mg/dL (ref 8.9–10.3)
Chloride: 107 mmol/L (ref 98–111)
Creatinine, Ser: 0.94 mg/dL (ref 0.44–1.00)
GFR, Estimated: >60 mL/min (ref >60)
Glucose, Bld: 121 mg/dL — ABNORMAL HIGH (ref 70–99)
Potassium: 2.8 mmol/L — ABNORMAL LOW (ref 3.5–5.1)
Sodium: 139 mmol/L (ref 135–145)

## 2022-07-26 LAB — PROTIME-INR
INR: 1 (ref 0.8–1.2)
Prothrombin Time: 13.1 seconds (ref 11.4–15.2)

## 2022-07-26 LAB — POC URINE PREG, ED: Preg Test, Ur: NEGATIVE

## 2022-07-26 MED ORDER — LORAZEPAM 1 MG PO TABS
1.0000 mg | ORAL_TABLET | Freq: Once | ORAL | Status: DC
  Filled 2022-07-26: qty 1

## 2022-07-26 NOTE — ED Notes (Signed)
Pt to ED for L chest pressure since last night. Mildly dizzy since this morning. Pt was concerned because in 2/23 had STEMI and 11/23 had NSTEMI. When she had the NSTEMI, only symptom was diaphoresis and "not feeling right". Due to this hx she wanted to be evaluated. EKG was done in triage and pt placed now on cardiac monitor. Skin is dry, well perfused. Pt is not SOB.

## 2022-07-26 NOTE — ED Provider Notes (Signed)
[facility] Provider Note    Event Date/Time   First MD Initiated Contact with Patient 07/26/22 [phone]     (approximate)  History   Chief Complaint: Chest Pain  HPI  Lisa Davila is a 47 y.o. female with a past medical history of anxiety, MI with 2 stents, depression, hypertension, hyperlipidemia, presents to the emergency department for chest pain.  According to the patient she has had a prior MI with 2 stents, follows up with Dr. Fletcher Anon.  States since yesterday she has been experiencing a very mild vague discomfort in her chest.  Patient also states a history of anxiety oftentimes with chest pain but since her MI, she said she would "rather be safe than sorry."  Denies any shortness of breath nausea or diaphoresis.  Physical Exam   Triage Vital Signs: ED Triage Vitals  Enc Vitals Group     BP 07/26/22 0734 109/85     Pulse Rate 07/26/22 0734 98     Resp 07/26/22 0734 18     Temp 07/26/22 0734 98.3 F (36.8 C)     Temp Source 07/26/22 0734 Oral     SpO2 07/26/22 0734 98 %     Weight --      Height --      Head Circumference --      Peak Flow --      Pain Score 07/26/22 0732 4     Pain Loc --      Pain Edu? --      Excl. in [place]? --     Most recent vital signs: Vitals:   07/26/22 0734  BP: 109/85  Pulse: 98  Resp: 18  Temp: 98.3 F (36.8 C)  SpO2: 98%    General: Awake, no distress.  Mildly anxious appearing. CV:  Good peripheral perfusion.  Regular rate and rhythm  Resp:  Normal effort.  Equal breath sounds bilaterally.  Abd:  No distention.  Soft, nontender.  No rebound or guarding.  ED Results / Procedures / Treatments   EKG  EKG viewed and interpreted by myself shows a normal sinus rhythm at 101 bpm with a narrow QRS, normal axis, normal intervals, nonspecific ST changes no ST elevation.  Occasional PVC.  RADIOLOGY  I have reviewed and interpreted the chest x-ray images.  No consolidation seen on my evaluation. Radiology has  read the x-ray as negative.   MEDICATIONS ORDERED IN ED: Medications  LORazepam (ATIVAN) tablet 1 mg (has no administration in time range)     IMPRESSION / MDM / ASSESSMENT AND PLAN / ED COURSE  I reviewed the triage vital signs and the nursing notes.  Patient's presentation is most consistent with acute presentation with potential threat to life or bodily function.  Patient presents emergency department for chest pain.  Overall the patient appears well, no distress, mild anxiety.  Is requesting something for her anxiety which I believe is reasonable to treat while we are awaiting lab work including cardiac enzymes.  EKG shows no concerning findings, chest x-ray is clear.  Physical exam reassuring.  Patient's chemistry shows mild hypokalemia at 2.8, CBC is normal, troponin is normal, INR is normal.  Chest x-ray is clear, EKG reassuring.  Given the patient's reassuring physical exam and lab work including cardiac enzymes I believe the patient is safe for discharge home with PCP/cardiology follow-up.  Discussed this plan of care with the patient she is agreeable.  Patient states she is already on potassium supplements but  forgot to take them over the last few days and will restart.  Patient will follow-up with her cardiologist.  FINAL CLINICAL IMPRESSION(S) / ED DIAGNOSES   Chest pain   Note:  This document was prepared using Dragon voice recognition software and may include unintentional dictation errors.   Harvest Dark, MD 07/26/22 [phone]

## 2022-07-26 NOTE — ED Triage Notes (Signed)
Patient to ED via POV for mid sternal chest pain that started last night. No radiating but feels like pressure. Hx of 2 MI. Also, dizziness.

## 2022-07-26 NOTE — ED Notes (Signed)
Waiting to give the ativan when pt has a ride home. Currently attempting to get a ride.

## 2022-08-01 ENCOUNTER — Other Ambulatory Visit: Payer: Self-pay

## 2022-08-10 ENCOUNTER — Other Ambulatory Visit: Payer: Self-pay

## 2022-08-11 ENCOUNTER — Other Ambulatory Visit: Payer: Self-pay

## 2022-08-18 ENCOUNTER — Encounter: Payer: Self-pay | Admitting: Cardiovascular Disease

## 2022-08-22 ENCOUNTER — Telehealth (HOSPITAL_COMMUNITY): Payer: Self-pay

## 2022-08-22 NOTE — Telephone Encounter (Signed)
Patient called back about information for in-home device?  It looks like you might be handling this .  The information is:  Science writer - [phone]  Custom Equipment - [phone]  DME Supply - [phone]  [employer]ME [employer] - [phone]  Thanks!

## 2022-08-23 ENCOUNTER — Other Ambulatory Visit: Payer: Self-pay

## 2022-08-23 MED ORDER — ALPRAZOLAM 0.5 MG PO TABS
0.5000 mg | ORAL_TABLET | Freq: Three times a day (TID) | ORAL | 2 refills | Status: DC | PRN
  Filled 2022-10-10: qty 90, 30d supply, fill #0
  Filled 2022-11-13: qty 90, 30d supply, fill #1
  Filled 2022-12-18: qty 90, 30d supply, fill #2

## 2022-08-24 ENCOUNTER — Other Ambulatory Visit: Payer: Self-pay

## 2022-08-24 MED ORDER — SEMAGLUTIDE-WEIGHT MANAGEMENT 0.25 MG/0.5ML ~~LOC~~ SOAJ
0.2500 mg | SUBCUTANEOUS | 0 refills | Status: DC
  Filled 2022-08-24: qty 2, 28d supply, fill #0

## 2022-08-29 ENCOUNTER — Other Ambulatory Visit: Payer: Self-pay

## 2022-08-29 ENCOUNTER — Emergency Department: Payer: 59

## 2022-08-29 ENCOUNTER — Emergency Department
Admission: EM | Admit: 2022-08-29 | Discharge: 2022-08-29 | Disposition: A | Discharge status: Home / Self Care | Payer: 59 | Attending: Emergency Medicine | Admitting: Emergency Medicine

## 2022-08-29 DIAGNOSIS — R079 Chest pain, unspecified: Secondary | ICD-10-CM | POA: Diagnosis not present

## 2022-08-29 DIAGNOSIS — R0602 Shortness of breath: Secondary | ICD-10-CM | POA: Insufficient documentation

## 2022-08-29 DIAGNOSIS — I110 Hypertensive heart disease with heart failure: Secondary | ICD-10-CM | POA: Insufficient documentation

## 2022-08-29 DIAGNOSIS — I509 Heart failure, unspecified: Secondary | ICD-10-CM | POA: Diagnosis not present

## 2022-08-29 DIAGNOSIS — R0789 Other chest pain: Secondary | ICD-10-CM | POA: Diagnosis not present

## 2022-08-29 LAB — BASIC METABOLIC PANEL
Anion gap: 9 (ref 5–15)
BUN: 5 mg/dL — ABNORMAL LOW (ref 6–20)
CO2: 28 mmol/L (ref 22–32)
Calcium: 9 mg/dL (ref 8.9–10.3)
Chloride: 103 mmol/L (ref 98–111)
Creatinine, Ser: 0.86 mg/dL (ref 0.44–1.00)
GFR, Estimated: >60 mL/min (ref >60)
Glucose, Bld: 139 mg/dL — ABNORMAL HIGH (ref 70–99)
Potassium: 3.5 mmol/L (ref 3.5–5.1)
Sodium: 140 mmol/L (ref 135–145)

## 2022-08-29 LAB — CBC
HCT: 46.2 % — ABNORMAL HIGH (ref 36.0–46.0)
Hemoglobin: 15.2 g/dL — ABNORMAL HIGH (ref 12.0–15.0)
MCH: 31.2 pg (ref 26.0–34.0)
MCHC: 32.9 g/dL (ref 30.0–36.0)
MCV: 94.9 fL (ref 80.0–100.0)
Platelets: 325 10*3/uL (ref 150–400)
RBC: 4.87 MIL/uL (ref 3.87–5.11)
RDW: 12.5 % (ref 11.5–15.5)
WBC: 8.5 10*3/uL (ref 4.0–10.5)
nRBC: 0 % (ref 0.0–0.2)

## 2022-08-29 LAB — TROPONIN I (HIGH SENSITIVITY)
Troponin I (High Sensitivity): 6 ng/L (ref <18)
Troponin I (High Sensitivity): 4 ng/L (ref <18)

## 2022-08-29 MED ORDER — MORPHINE SULFATE (PF) 4 MG/ML IV SOLN
4.0000 mg | Freq: Once | INTRAVENOUS | Status: DC
  Filled 2022-08-29: qty 1

## 2022-08-29 MED ORDER — ONDANSETRON HCL 4 MG PO TABS
4.0000 mg | ORAL_TABLET | Freq: Three times a day (TID) | ORAL | 0 refills | Status: DC | PRN
  Filled 2022-08-29: qty 18, 21d supply, fill #0

## 2022-08-29 MED ORDER — ONDANSETRON 4 MG PO TBDP
4.0000 mg | ORAL_TABLET | Freq: Once | ORAL | Status: AC
  Administered 2022-08-29: 4 mg via ORAL
  Filled 2022-08-29: qty 1

## 2022-08-29 MED ORDER — ONDANSETRON HCL 4 MG PO TABS: 4.0000 mg | ORAL_TABLET | Freq: Three times a day (TID) | ORAL | 0 refills | Status: DC | PRN

## 2022-08-29 NOTE — ED Provider Notes (Signed)
[facility] Provider Note    Event Date/Time   First MD Initiated Contact with Patient 08/29/22 [phone]     (approximate)  History   Chief Complaint: Chest Pain  HPI  Lisa Davila is a 47 y.o. female with a past medical history of anxiety, CHF, hypertension, hyperlipidemia, prior NSTEMI, presents emergency department for chest pain.  According to the patient approximately 1 to 2 hours prior to arrival she developed central chest pain.  Patient states she has had a prior NSTEMI, states anytime she gets chest pain she comes to the emergency department for evaluation.  Patient states she did feel somewhat nauseated this morning but no obvious shortness of breath or diaphoresis.  Patient states she does get chest pain fairly frequently and comes to the emergency department for each event as she does not want to take any risk given her NSTEMI several years ago.  Physical Exam   Triage Vital Signs: ED Triage Vitals  Enc Vitals Group     BP 08/29/22 0843 113/82     Pulse Rate 08/29/22 0843 97     Resp 08/29/22 0843 18     Temp 08/29/22 0843 98 F (36.7 C)     Temp src --      SpO2 08/29/22 0843 100 %     Weight --      Height --      Head Circumference --      Peak Flow --      Pain Score 08/29/22 0842 10     Pain Loc --      Pain Edu? --      Excl. in GC? --     Most recent vital signs: Vitals:   08/29/22 0843 08/29/22 1045  BP: 113/82 102/70  Pulse: 97 73  Resp: 18 18  Temp: 98 F (36.7 C)   SpO2: 100% 93%    General: Awake, no distress.  CV:  Good peripheral perfusion.  Regular rate and rhythm  Resp:  Normal effort.  Equal breath sounds bilaterally.  Abd:  No distention.  Soft, nontender.  No rebound or guarding.  ED Results / Procedures / Treatments   EKG  EKG viewed and interpreted by myself shows a normal sinus rhythm at 98 bpm with a narrow QRS, normal axis, normal intervals, no concerning ST changes.  No ST  elevation.  RADIOLOGY  I have reviewed and interpreted the chest x-ray images.  No consolidation seen on my evaluation. Radiology has read the chest x-ray as negative.   MEDICATIONS ORDERED IN ED: Medications  morphine (PF) 4 MG/ML injection 4 mg (0 mg Intramuscular Hold 08/29/22 0935)  ondansetron (ZOFRAN-ODT) disintegrating tablet 4 mg (4 mg Oral Given 08/29/22 0934)     IMPRESSION / MDM / ASSESSMENT AND PLAN / ED COURSE  I reviewed the triage vital signs and the nursing notes.  Patient's presentation is most consistent with acute presentation with potential threat to life or bodily function.  Patient presents to the emergency department for chest pain.  Patient has a history of an NSTEMI several years ago.  Patient states 1 to 2 hours prior to arrival and developed moderate central chest pain.  Patient states she gets chest pain fairly frequently.  She is prescribed nitroglycerin to use for the chest pain but she does not use it because it gives her a headache per patient.  Here patient appears well, reassuring vitals, reassuring physical exam with no concerning findings.  EKG shows  no significant findings either.  Patient's lab work is reassuring with a normal CBC with a normal white blood cell count, chemistry is normal including normal renal function.  Troponin is negative x 2.  Given the patient's reassuring workup negative troponin x 2 reassuring physical exam and vitals I believe the patient is safe for discharge home from an emergency standpoint.  Patient to follow-up with her cardiologist.  Discussed my typical chest pain return precautions.  Patient agreeable to plan of care.  FINAL CLINICAL IMPRESSION(S) / ED DIAGNOSES   Chest pain   Note:  This document was prepared using Dragon voice recognition software and may include unintentional dictation errors.   Minna Antis, MD 08/29/22 1134

## 2022-08-29 NOTE — ED Triage Notes (Signed)
Pt comes with c/o mid sternal cp. Pt states it just started this morning while getting ready for work. Pt states hx of 2 MIs in past. Pt states sob.

## 2022-09-08 ENCOUNTER — Encounter (HOSPITAL_COMMUNITY): Payer: Self-pay

## 2022-09-08 ENCOUNTER — Ambulatory Visit: Payer: Medicaid Other | Attending: Physician Assistant | Admitting: Physician Assistant

## 2022-09-08 ENCOUNTER — Encounter: Payer: Self-pay | Admitting: Physician Assistant

## 2022-09-08 ENCOUNTER — Other Ambulatory Visit: Payer: Self-pay

## 2022-09-08 VITALS — BP 97/72 | HR 77 | Ht 61.0 in | Wt 177.8 lb

## 2022-09-08 DIAGNOSIS — Z87891 Personal history of nicotine dependence: Secondary | ICD-10-CM

## 2022-09-08 DIAGNOSIS — I25118 Atherosclerotic heart disease of native coronary artery with other forms of angina pectoris: Secondary | ICD-10-CM | POA: Diagnosis not present

## 2022-09-08 DIAGNOSIS — E785 Hyperlipidemia, unspecified: Secondary | ICD-10-CM | POA: Diagnosis not present

## 2022-09-08 DIAGNOSIS — R072 Precordial pain: Secondary | ICD-10-CM

## 2022-09-08 DIAGNOSIS — I255 Ischemic cardiomyopathy: Secondary | ICD-10-CM

## 2022-09-08 DIAGNOSIS — I471 Supraventricular tachycardia, unspecified: Secondary | ICD-10-CM | POA: Diagnosis not present

## 2022-09-08 DIAGNOSIS — I5022 Chronic systolic (congestive) heart failure: Secondary | ICD-10-CM | POA: Diagnosis not present

## 2022-09-08 MED ORDER — REPATHA SURECLICK 140 MG/ML ~~LOC~~ SOAJ
140.0000 mg | SUBCUTANEOUS | 2 refills | Status: DC
  Filled 2022-09-08 – 2022-10-10 (×3): qty 2, 28d supply, fill #0

## 2022-09-08 NOTE — Progress Notes (Signed)
Cardiology Office Note    Date:  09/08/2022   ID:  ZALA DINALLO, DOB Jul 19, 1975, MRN [id number]  PCP:  Barbette Reichmann, MD  Cardiologist:  Lorine Bears, MD  Electrophysiologist:  None   Chief Complaint: ED follow-up  History of Present Illness:   Lisa Davila is a 47 y.o. female with history of CAD with inferior ST elevation MI status post PCI/DES to the RCA in 06/2021 status post mechanical thrombectomy and DES placement in 03/2022 for late stent thrombosis, HFrEF, SVT status post prior EP study with no inducible arrhythmia, fibromyalgia, HTN, HLD, tobacco use, anxiety, and hypokalemia who presents for ED follow-up.  She presented in 06/2021 with an inferior ST elevation MI with emergent LHC showing thrombotic subtotal occlusion of the proximal RCA which was treated successfully with PCI/DES.  There was mild nonobstructive disease affecting the left coronary arteries.  Echo showed an EF of 50 to 55%.  Brilinta was transition to clopidogrel secondary to persistent dyspnea.  Following diagnosis of COVID, she had had recurrent chest pain with multiple emergency room visits following her MI.  Repeat echo in 08/2021 showed an EF of 35%.  Due to her drop in EF and symptoms, R/LHC was performed in 11/2021 which showed patent RCA stent with mild in-stent restenosis and no evidence of obstructive disease affecting the left coronary arteries.  EF was 35%.  RHC showed mildly to moderately elevated filling pressures, minimal pulmonary hypertension, and normal cardiac output.  She presented to the hospital in 03/2022 with recurrent MI.  LHC in 03/2022 showed severe single-vessel CAD with occlusion of proximal/mid RCA with late stent thrombosis and heavy thrombus burden.  Nonobstructive CAD was observed in the left coronary artery similar to cath from 11/2021.  She underwent successful mechanical thrombectomy and DES placement to the proximal/mid RCA.  Echo in 03/2022 showed an EF of 35 to 40%,  hypokinesis of the left ventricular lateral wall and inferolateral wall, mild LVH, grade 1 diastolic dysfunction, mildly reduced RV systolic function with normal ventricular cavity size, and trivial mitral regurgitation.  She was seen in 06/2022 after having been recently diagnosed with COVID and reported chest tightness similar to prior angina.  Given this, she underwent LHC on 06/26/2022 that showed widely patent RCA stents with no significant restenosis with otherwise nonobstructive disease as outlined below.  LVEF 45 to 50% by visual estimate.  Medical therapy was recommended along with titration of Toprol-XL to 100 mg daily for improvement in sinus tachycardia.  She presented to the ED on 07/26/2022 and 08/29/2022 with intermittent chest discomfort with reassuring workup including multiple negative high-sensitivity troponins.  She comes in today continuing to note intermittent substernal chest tightness that radiates to the right anterior chest with associated nausea and shortness of breath.  Symptoms feel similar to what she has been experiencing since undergoing PCI for NSTEMI in 03/2022.  She reports continued abstinence from tobacco use and is adherent to cardiac medications.  No falls or symptoms concerning for bleeding.  No dizziness, presyncope, or syncope.  She also reports underlying anxiety since 2 MIs and does wonder if this is playing a role in her symptomology as well.   Labs independently reviewed: 08/2022 - Hgb 15.2, PLT 325, potassium 3.5, BUN 5, serum creatinine 0.86 07/2022 - albumin 4.2, AST/ALT normal 03/2022 - LP(a) 81.7, TC 201, TG 180, HDL 28, LDL 137, magnesium 2.0 01/2022 - A1c 5.9, TSH normal  Past Medical History:  Diagnosis Date   Acute ethmoidal sinusitis  Acute ethmoidal sinusitis    Acute maxillary sinusitis    Acute upper respiratory infections of unspecified site    Anxiety state, unspecified    Chest pain, unspecified    CHF (congestive heart failure) (HCC)     Coronary artery disease    Depression    Diarrhea    Esophageal reflux    Generalized anxiety disorder    Hyperlipidemia    Hypertension    Hypoglycemia, unspecified    Lumbago    Nausea with vomiting    Obstructive chronic bronchitis with exacerbation (HCC)    Other and unspecified hyperlipidemia    Other and unspecified peripheral vertigo(386.19)    Other bursitis disorders    Other malaise and fatigue    Other specified diseases due to viruses    Other vitamin B12 deficiency anemia    Pain in limb    Pernicious anemia    Sleep related hypoventilation/hypoxemia in conditions classifiable elsewhere    SVT (supraventricular tachycardia)    Swelling, mass, or lump in head and neck    Tobacco use disorder    Unspecified disorder of external ear    Unspecified disorder of skin and subcutaneous tissue    Unspecified otitis media     Past Surgical History:  Procedure Laterality Date   CESAREAN SECTION     CORONARY STENT INTERVENTION N/A 03/14/2022   Procedure: CORONARY STENT INTERVENTION;  Surgeon: Yvonne Kendall, MD;  Location: [facility];  Service: Cardiovascular;  Laterality: N/A;   CORONARY THROMBECTOMY N/A 03/14/2022   Procedure: Coronary Thrombectomy;  Surgeon: Yvonne Kendall, MD;  Location: [facility];  Service: Cardiovascular;  Laterality: N/A;   CORONARY/GRAFT ACUTE MI REVASCULARIZATION N/A 07/04/2021   Procedure: Coronary/Graft Acute MI Revascularization;  Surgeon: Iran Ouch, MD;  Location: [facility];  Service: Cardiovascular;  Laterality: N/A;   LEFT HEART CATH AND CORONARY ANGIOGRAPHY N/A 07/04/2021   Procedure: LEFT HEART CATH AND CORONARY ANGIOGRAPHY;  Surgeon: Iran Ouch, MD;  Location: [facility] CV [facility];  Service: Cardiovascular;  Laterality: N/A;   LEFT HEART CATH AND CORONARY ANGIOGRAPHY N/A 03/14/2022   Procedure: LEFT HEART CATH AND CORONARY ANGIOGRAPHY;  Surgeon: Yvonne Kendall, MD;  Location: [facility] CV  [facility];  Service: Cardiovascular;  Laterality: N/A;   LEFT HEART CATH AND CORONARY ANGIOGRAPHY N/A 06/26/2022   Procedure: LEFT HEART CATH AND CORONARY ANGIOGRAPHY;  Surgeon: Iran Ouch, MD;  Location: [facility];  Service: Cardiovascular;  Laterality: N/A;   RIGHT/LEFT HEART CATH AND CORONARY ANGIOGRAPHY N/A 11/07/2021   Procedure: RIGHT/LEFT HEART CATH AND CORONARY ANGIOGRAPHY;  Surgeon: Iran Ouch, MD;  Location: [facility];  Service: Cardiovascular;  Laterality: N/A;    Current Medications: Current Meds  Medication Sig   albuterol (VENTOLIN HFA) 108 (90 Base) MCG/ACT inhaler Inhale 2 puffs into the lungs every 6 hours as needed for wheezing.   ALPRAZolam (XANAX) 0.5 MG tablet Take 1 tablet (0.5 mg total) by mouth 3 (three) times daily as needed for Sleep   aspirin 81 MG chewable tablet Chew 1 tablet (81 mg total) by mouth daily.   Cholecalciferol 1.25 MG (50000 UT) TABS Take 1.25 tablets by mouth once a week.   cyanocobalamin (VITAMIN B12) 1000 MCG tablet Take 1 tablet (1,000 mcg total) by mouth once daily   Evolocumab (REPATHA SURECLICK) 140 MG/ML SOAJ Inject 140 mg into the skin every 14 (fourteen) days.   ipratropium-albuterol (DUONEB) 0.5-2.5 (3) MG/3ML SOLN Take 3  mls by nebulization 4 (four) times daily for 30 days   metoprolol succinate (TOPROL-XL) 50 MG 24 hr tablet Take 1 tablet (50 mg total) by mouth daily.   nicotine (NICODERM CQ - DOSED IN MG/24 HOURS) 14 mg/24hr patch Place 1 patch onto the skin daily for 14 days   nitroGLYCERIN (NITROSTAT) 0.4 MG SL tablet Place 1 tablet (0.4 mg total) under the tongue every 5 (five) minutes as needed for chest pain.   potassium chloride SA (KLOR-CON M) 20 MEQ tablet Take 2 tablets (40 mEq total) by mouth daily.   prasugrel (EFFIENT) 10 MG TABS tablet Take 1 tablet (10 mg total) by mouth daily.   rosuvastatin (CRESTOR) 40 MG tablet Take 1 tablet (40 mg total) by mouth daily.    Allergies:   Buspirone, Citalopram,  Metrizamide, Sulfa antibiotics, Contrast media [iodinated contrast media], Sulfa drugs cross reactors, and Sulfasalazine   Social History   Socioeconomic History   Marital status: Single    Spouse name: Not on file   Number of children: Not on file   Years of education: Not on file   Highest education level: Not on file  Occupational History   Not on file  Tobacco Use   Smoking status: Former    Years: 30    Types: Cigarettes    Quit date: 03/13/2022    Years since quitting: 0.4   Smokeless tobacco: Never   Tobacco comments:    Has been smoking for 30 years Wants to quit after she gets past all the heart stuff and changes in life.  Smoking 3 a day right now  Vaping Use   Vaping Use: Never used  Substance and Sexual Activity   Alcohol use: No   Drug use: Never   Sexual activity: Not Currently  Other Topics Concern   Not on file  Social History Narrative   Lives with fiance, Mariel Kansky.   Social Determinants of Health   Financial Resource Strain: Not on file  Food Insecurity: No Food Insecurity (03/14/2022)   Hunger Vital Sign    Worried About Running Out of Food in the Last Year: Never true    Ran Out of Food in the Last Year: Never true  Transportation Needs: No Transportation Needs (03/14/2022)   PRAPARE - Administrator, Civil Service (Medical): No    Lack of Transportation (Non-Medical): No  Physical Activity: Not on file  Stress: Not on file  Social Connections: Not on file     Family History:  The patient's family history includes Breast cancer in her maternal grandmother; Cancer in her father and paternal grandfather; Depression in her mother; Hypertension in her sister.  ROS:   12-point review of systems is negative unless otherwise noted in the HPI.   EKGs/Labs/Other Studies Reviewed:    Studies reviewed were summarized above. The additional studies were reviewed today:  LHC 06/26/2022:   Mid LAD lesion is 30% stenosed.   2nd Diag lesion is 50%  stenosed.   Mid RCA lesion is 30% stenosed.   Non-stenotic Prox RCA to Mid RCA lesion was previously treated.   There is mild left ventricular systolic dysfunction.   LV end diastolic pressure is normal.   The left ventricular ejection fraction is 45-50% by visual estimate.   1.  Widely patent RCA stents with no significant restenosis. 2.  Mildly reduced LV systolic function with normal LVEDP.   Recommendations: Continue aggressive medical therapy. Increase Toprol to 100 mg once daily for better  control of sinus tachycardia. __________  LHC 03/14/2022: Conclusions: Severe single vessel CAD with occlusion of proximal/mid RCA with late stent thrombosis and heavy thrombus burden. Non-obstructive coronary artery disease observed in the left coronary artery, similar to prior cath from 11/2021. Mildly elevated left ventricular filling pressure (LVEDP 20 mmHg). Successful mechanical thrombectomy and drug-eluting stent placement to proximal/mid RCA using Onyx Frontier 3.0 x 26 mm drug-eluting stent (postdilated up to 4.0 mm) with 0% residual stenosis and TIMI-3 flow.   Recommendations: Tirofiban infusion x 4 hours. Continue dual antiplatelet therapy with aspirin and prasugrel for at least 12 months.  Check CYP 2C19 genotype to assess clopidogrel response. Follow-up echocardiogram. Aggressive secondary prevention of coronary artery disease and escalation of evidence-based heart failure therapy as blood pressure allows. __________  2D echo 03/14/2022: 1. Left ventricular ejection fraction, by estimation, is 35 to 40%. The  left ventricle has moderately decreased function. The left ventricle  demonstrates regional wall motion abnormalities (see scoring  diagram/findings for description). There is mild  left ventricular hypertrophy. Left ventricular diastolic parameters are  consistent with Grade I diastolic dysfunction (impaired relaxation). There  is severe hypokinesis of the left ventricular,  entire lateral wall and  inferolateral wall.   2. Right ventricular systolic function is mildly reduced. The right  ventricular size is normal.   3. The mitral valve is normal in structure. Trivial mitral valve  regurgitation.   4. The aortic valve was not well visualized. Aortic valve regurgitation  is not visualized. No aortic stenosis is present.  __________  [facility] patch 12/2021: Patient had a min HR of 59 bpm, max HR of 123 bpm, and avg HR of 89 bpm. Predominant underlying rhythm was Sinus Rhythm.  Rare PACs and rare PVCs. No significant arrhythmia overall. __________  [facility] 11/07/2021:   Mid LAD lesion is 30% stenosed.   Prox RCA lesion is 20% stenosed.   2nd Diag lesion is 50% stenosed.   There is moderate left ventricular systolic dysfunction.   LV end diastolic pressure is mildly elevated.   1.  Patent proximal RCA stent with mild in-stent restenosis.  No obstructive disease affecting the left coronary artery system. 2.  Moderately reduced LV systolic function with an EF of 35% with global hypokinesis that is more prominent in the inferior apical area. 3.  Right heart catheterization showed mildly to moderately elevated filling pressures, minimal pulmonary hypertension and normal cardiac output.   Recommendations: Continue medical therapy for coronary artery disease. Optimize heart failure treatment.  The patient is volume overloaded.  I increased Lasix to 40 mg once daily.  In addition, due to resting sinus tachycardia, increased Toprol to 50 mg once daily. We will plan on adding an ARB and SGLT2 inhibitor upon follow-up. __________  2D echo 07/04/2021: 1. Left ventricular ejection fraction, by estimation, is 50 to 55%. The  left ventricle has low normal function. The left ventricle has no regional  wall motion abnormalities. There is mild left ventricular hypertrophy.  Left ventricular diastolic  parameters are indeterminate.   2. Right ventricular systolic function is  normal. The right ventricular  size is normal.   3. The mitral valve is normal in structure. Trivial mitral valve  regurgitation. No evidence of mitral stenosis.   4. The aortic valve is normal in structure. Aortic valve regurgitation is  not visualized. No aortic stenosis is present.   5. The inferior vena cava is normal in size with greater than 50%  respiratory variability, suggesting right atrial pressure  of 3 mmHg.  __________  [facility] 07/04/2021:   Prox RCA lesion is 99% stenosed.   Mid LAD lesion is 30% stenosed.   A drug-eluting stent was successfully placed using a STENT ONYX FRONTIER 4.0X22.   Post intervention, there is a 0% residual stenosis.   There is mild left ventricular systolic dysfunction.   LV end diastolic pressure is mildly elevated.   The left ventricular ejection fraction is 45-50% by visual estimate.   1.  Severe one-vessel coronary artery disease with thrombotic subtotal occlusion of the proximal right coronary artery which is the culprit for inferior ST elevation myocardial infarction.  Mild disease affecting the left coronary system. 2.  Mildly reduced LV systolic function and mildly elevated left ventricular end-diastolic pressure at 17 mmHg. 3.  Successful angioplasty and drug-eluting stent placement to the right coronary artery.   Recommendations: Given large thrombus burden, continue Aggrastat infusion for 12 hours. Dual antiplatelet therapy is recommended for at least 12 months. Aggressive treatment of risk factors and smoking cessation.   EKG:  EKG is ordered today.  The EKG ordered today demonstrates NSR, 77 bpm, low voltage QRS, prior inferior infarct, anterolateral T wave inversion more pronounced with today when compared to prior tracing  Recent Labs: 03/13/2022: B Natriuretic Peptide 92.8; Magnesium 2.0 08/29/2022: BUN 5; Creatinine, Ser 0.86; Hemoglobin 15.2; Platelets 325; Potassium 3.5; Sodium 140  Recent Lipid Panel    Component Value Date/Time    CHOL 201 (H) 03/14/2022 0148   CHOL 280 (H) 11/29/2011 1326   TRIG 180 (H) 03/14/2022 0148   TRIG 186 11/29/2011 1326   HDL 28 (L) 03/14/2022 0148   HDL 29 (L) 11/29/2011 1326   CHOLHDL 7.2 03/14/2022 0148   VLDL 36 03/14/2022 0148   VLDL 37 11/29/2011 1326   LDLCALC 137 (H) 03/14/2022 0148   LDLCALC 214 (H) 11/29/2011 1326    PHYSICAL EXAM:    VS:  BP 97/72 (BP Location: Right Arm, Patient Position: Sitting, Cuff Size: Normal)   Pulse 77   Ht 5\' 1"  (1.549 m)   Wt 177 lb 12.8 oz (80.6 kg)   SpO2 95%   BMI 33.60 kg/m   BMI: Body mass index is 33.6 kg/m.  Physical Exam Vitals reviewed.  Constitutional:      Appearance: She is well-developed.  HENT:     Head: Normocephalic and atraumatic.     Comments: Xanthomas noted along the bilateral medial aspect of the upper eye. Eyes:     General:        Right eye: No discharge.        Left eye: No discharge.  Neck:     Vascular: No JVD.  Cardiovascular:     Rate and Rhythm: Normal rate and regular rhythm.     Heart sounds: Normal heart sounds, S1 normal and S2 normal. Heart sounds not distant. No midsystolic click and no opening snap. No murmur heard.    No friction rub.  Pulmonary:     Effort: Pulmonary effort is normal. No respiratory distress.     Breath sounds: Normal breath sounds. No decreased breath sounds, wheezing or rales.  Chest:     Chest wall: No tenderness.  Abdominal:     General: There is no distension.  Musculoskeletal:     Cervical back: Normal range of motion.     Right lower leg: No edema.     Left lower leg: No edema.  Skin:    General: Skin is warm and dry.  Nails: There is no clubbing.  Neurological:     Mental Status: She is alert and oriented to person, place, and time.  Psychiatric:        Speech: Speech normal.        Behavior: Behavior normal.        Thought Content: Thought content normal.        Judgment: Judgment normal.     Wt Readings from Last 3 Encounters:  09/08/22 177 lb  12.8 oz (80.6 kg)  06/26/22 160 lb (72.6 kg)  06/23/22 174 lb 2 oz (79 kg)     ASSESSMENT & PLAN:   CAD involving the native coronary arteries with stable angina: She continues to report intermittent randomly occurring substernal chest tightness that radiates to the right anterior chest that dates back to her PCI in 03/2022.  Most recent LHC from 06/2022 demonstrated patent RCA stent with otherwise nonobstructive disease involving the left coronary tree.  EKG today is notable for new anterolateral T wave inversion.  Schedule Lexiscan MPI to evaluate for high risk ischemia.  Continue aggressive risk factor modification and secondary prevention including aspirin, Effient oral XL, rosuvastatin, and as needed SL NTG.  Relative hypotension precludes escalation of antianginal therapy at this time.  HFrEF: Euvolemic and well compensated.  Most recent evaluation of LV systolic function via LV gram in 06/2022 demonstrated improvement in her EF up to 45 to 50%.  Remains on Toprol-XL and spironolactone.  Relative hypotension precludes escalation of GDMT.  Update echo.  PSVT: Quiescent on Toprol-XL.  HLD: LDL 137 in 03/2022 with LP(a) 81.7.  She remains on rosuvastatin 40 mg daily.  Add Repatha 140 mg every 2 weeks given elevated LP(a).  Prior tobacco use: She continues to abstain.   Shared Decision Making/Informed Consent{  The risks [chest pain, shortness of breath, cardiac arrhythmias, dizziness, blood pressure fluctuations, myocardial infarction, stroke/transient ischemic attack, nausea, vomiting, allergic reaction, radiation exposure, metallic taste sensation and life-threatening complications (estimated to be 1 in 10,000)], benefits (risk stratification, diagnosing coronary artery disease, treatment guidance) and alternatives of a nuclear stress test were discussed in detail with Ms. Vanderpoel and she agrees to proceed.     Disposition: F/u with Dr. Kirke Corin or an APP after testing.   Medication  Adjustments/Labs and Tests Ordered: Current medicines are reviewed at length with the patient today.  Concerns regarding medicines are outlined above. Medication changes, Labs and Tests ordered today are summarized above and listed in the Patient Instructions accessible in Encounters.   Signed, Eula Listen, PA-C 09/08/2022 12:50 PM     [facility] [place] [address] [place], [place] [address] [phone]

## 2022-09-08 NOTE — Patient Instructions (Signed)
Medication Instructions:  Your physician has recommended you make the following change in your medication:   START Repatha 140 mg once every 2 weeks.   *If you need a refill on your cardiac medications before your next appointment, please call your pharmacy*   Lab Work: None  If you have labs (blood work) drawn today and your tests are completely normal, you will receive your results only by: MyChart Message (if you have MyChart) OR A paper copy in the mail If you have any lab test that is abnormal or we need to change your treatment, we will call you to review the results.   Testing/Procedures: Your physician has requested that you have an echocardiogram. Echocardiography is a painless test that uses sound waves to create images of your heart. It provides your doctor with information about the size and shape of your heart and how well your heart's chambers and valves are working. This procedure takes approximately one hour. There are no restrictions for this procedure. Please do NOT wear cologne, perfume, aftershave, or lotions (deodorant is allowed). Please arrive 15 minutes prior to your appointment time.  [facility] MYOVIEW  Your Provider has ordered a Stress Test with nuclear imaging. The purpose of this test is to evaluate the blood supply to your heart muscle. This procedure is referred to as a "Non-Invasive Stress Test." This is because other than having an IV started in your vein, nothing is inserted or "invades" your body. Cardiac stress tests are done to find areas of poor blood flow to the heart by determining the extent of coronary artery disease (CAD). Some patients exercise on a treadmill, which naturally increases the blood flow to your heart, while others who are unable to walk on a treadmill due to physical limitations have a pharmacologic/chemical stress agent called Lexiscan. This medicine will mimic walking on a treadmill by temporarily increasing your coronary blood flow.      REPORT TO [facility] MEDICAL MALL ENTRANCE  **Proceed to the 1st desk on the right, REGISTRATION, to check in**  Please note: this test may take anywhere between 2-4 hours to complete    Instructions regarding medication:   XX_:   You may take all of your regular morning medications the day of your test unless listed below.    _XX___:  Hold spironolactone the morning of your test.      How to prepare for your Myoview test:  Do not eat or drink for 6 hours prior to the test No caffeine for 24 hours prior to the test No smoking 24 hours prior to the test. Ladies, please do not wear dresses.  Skirts or pants are appropriate. Please wear a short sleeve shirt. No perfume, cologne or lotion. Wear comfortable walking shoes. No heels!   PLEASE NOTIFY THE OFFICE AT LEAST 24 HOURS IN ADVANCE IF YOU ARE UNABLE TO KEEP YOUR APPOINTMENT.  [phone] AND  PLEASE NOTIFY NUCLEAR MEDICINE AT [facility] AT LEAST 24 HOURS IN ADVANCE IF YOU ARE UNABLE TO KEEP YOUR APPOINTMENT. [phone]       Follow-Up: At [facility], you and your health needs are our priority.  As part of our continuing mission to provide you with exceptional heart care, we have created designated Provider Care Teams.  These Care Teams include your primary Cardiologist (physician) and Advanced Practice Providers (APPs -  Physician Assistants and Nurse Practitioners) who all work together to provide you with the care you need, when you need it.    Your  next appointment:   Follow up after testing has been completed.   Provider:   Lorine Bears, MD ONLY

## 2022-09-09 DIAGNOSIS — R519 Headache, unspecified: Secondary | ICD-10-CM | POA: Diagnosis not present

## 2022-09-09 DIAGNOSIS — M542 Cervicalgia: Secondary | ICD-10-CM | POA: Diagnosis not present

## 2022-09-11 ENCOUNTER — Other Ambulatory Visit (HOSPITAL_COMMUNITY): Payer: Self-pay

## 2022-09-11 ENCOUNTER — Telehealth: Payer: Self-pay

## 2022-09-11 NOTE — Telephone Encounter (Signed)
Pharmacy Patient Advocate Encounter   Received notification from [facility]  (RN PAMELA ALLEN)that prior authorization for Repatha 140mg /ml is required/requested.   PA submitted on 5.6.24 to (ins) [employer] via CoverMyMeds  Key or (Medicaid) confirmation # [id number]  Status is pending

## 2022-09-12 ENCOUNTER — Other Ambulatory Visit (HOSPITAL_COMMUNITY): Payer: Self-pay

## 2022-09-12 ENCOUNTER — Other Ambulatory Visit: Payer: Self-pay

## 2022-09-12 MED ORDER — NICOTINE 14 MG/24HR TD PT24
MEDICATED_PATCH | TRANSDERMAL | 0 refills | Status: DC
  Filled 2022-09-12: qty 28, 28d supply, fill #0

## 2022-09-12 MED ORDER — ERGOCALCIFEROL 1.25 MG (50000 UT) PO CAPS
50000.0000 [IU] | ORAL_CAPSULE | ORAL | 1 refills | Status: DC
  Filled 2022-09-12: qty 12, fill #0
  Filled 2022-10-10: qty 4, 28d supply, fill #0

## 2022-09-12 NOTE — Telephone Encounter (Signed)
Can we appeal?  We prescribed PCSK9 inhibitor due to elevated LP(a) in an effort to further mitigate cardiovascular risk.  Vytorin will not treat this.

## 2022-09-12 NOTE — Telephone Encounter (Signed)
Pharmacy Patient Advocate Encounter  Received notification from [facility] that the request for prior authorization for Repatha 140mg /ml has been denied due to .      Pt will need to try a combo drug such as vytorin before insurance will cover repatha or another pcsk9i inhibitor

## 2022-09-14 NOTE — Telephone Encounter (Signed)
Can you start an appeal?  Patient has also had an MI.

## 2022-09-15 ENCOUNTER — Other Ambulatory Visit: Payer: Self-pay

## 2022-09-18 ENCOUNTER — Other Ambulatory Visit (HOSPITAL_COMMUNITY): Payer: Self-pay

## 2022-09-18 ENCOUNTER — Telehealth: Payer: Self-pay

## 2022-09-20 ENCOUNTER — Emergency Department
Admission: EM | Admit: 2022-09-20 | Discharge: 2022-09-20 | Disposition: A | Discharge status: Home / Self Care | Payer: 59 | Attending: Emergency Medicine | Admitting: Emergency Medicine

## 2022-09-20 ENCOUNTER — Emergency Department: Payer: 59

## 2022-09-20 ENCOUNTER — Other Ambulatory Visit: Payer: Self-pay

## 2022-09-20 ENCOUNTER — Telehealth: Payer: Self-pay | Admitting: Cardiovascular Disease

## 2022-09-20 DIAGNOSIS — I110 Hypertensive heart disease with heart failure: Secondary | ICD-10-CM | POA: Diagnosis not present

## 2022-09-20 DIAGNOSIS — R0789 Other chest pain: Secondary | ICD-10-CM | POA: Insufficient documentation

## 2022-09-20 DIAGNOSIS — I509 Heart failure, unspecified: Secondary | ICD-10-CM | POA: Insufficient documentation

## 2022-09-20 DIAGNOSIS — R079 Chest pain, unspecified: Secondary | ICD-10-CM

## 2022-09-20 DIAGNOSIS — E876 Hypokalemia: Secondary | ICD-10-CM | POA: Insufficient documentation

## 2022-09-20 LAB — CBC
HCT: 41.1 % (ref 36.0–46.0)
Hemoglobin: 14.2 g/dL (ref 12.0–15.0)
MCH: 32.4 pg (ref 26.0–34.0)
MCHC: 34.5 g/dL (ref 30.0–36.0)
MCV: 93.8 fL (ref 80.0–100.0)
Platelets: 318 10*3/uL (ref 150–400)
RBC: 4.38 MIL/uL (ref 3.87–5.11)
RDW: 12.6 % (ref 11.5–15.5)
WBC: 9.5 10*3/uL (ref 4.0–10.5)
nRBC: 0 % (ref 0.0–0.2)

## 2022-09-20 LAB — BASIC METABOLIC PANEL
Anion gap: 7 (ref 5–15)
BUN: 6 mg/dL (ref 6–20)
CO2: 28 mmol/L (ref 22–32)
Calcium: 8.9 mg/dL (ref 8.9–10.3)
Chloride: 104 mmol/L (ref 98–111)
Creatinine, Ser: 0.79 mg/dL (ref 0.44–1.00)
GFR, Estimated: >60 mL/min (ref >60)
Glucose, Bld: 116 mg/dL — ABNORMAL HIGH (ref 70–99)
Potassium: 2.9 mmol/L — ABNORMAL LOW (ref 3.5–5.1)
Sodium: 139 mmol/L (ref 135–145)

## 2022-09-20 LAB — TROPONIN I (HIGH SENSITIVITY): Troponin I (High Sensitivity): 5 ng/L (ref <18)

## 2022-09-20 MED ORDER — POTASSIUM CHLORIDE CRYS ER 20 MEQ PO TBCR: 40.0000 meq | EXTENDED_RELEASE_TABLET | Freq: Once | ORAL | Status: DC

## 2022-09-20 NOTE — Discharge Instructions (Signed)
You were seen in the Emergency Department today for evaluation of your chest pain. Fortunately, your labs, EKG, and chest x-Zailyn Rowser were overall reassuring against a emergency cause for your pain.  Your potassium level was low, please make sure you take your potassium when you return home and continue to take it as directed.  Please keep your scheduled follow-up tomorrow for completion of a stress test.  Return to the ER for any new or worsening symptoms including worsening chest pain, difficulty breathing, or any other new or concerning symptoms that you believe warrants immediate attention.

## 2022-09-20 NOTE — ED Notes (Signed)
Looked in lobby and outside, unable to locate pt to room

## 2022-09-20 NOTE — ED Notes (Signed)
MD Ray at bedside. 

## 2022-09-20 NOTE — ED Triage Notes (Signed)
Pt to ED with c/o midsternal chest pain that began this am with sob. Hx of 2 MI's.

## 2022-09-20 NOTE — ED Provider Notes (Signed)
[facility] Provider Note    Event Date/Time   First MD Initiated Contact with Patient 09/20/22 1710     (approximate)   History   Chest Pain   HPI  Lisa Davila is a 47 y.o. female with history of anxiety, CHF, hypertension, prior NSTEMI presenting to the emergency department for evaluation of chest pain.  Patient reports that since 7 this morning she has had chest pain described as a pressure in the center of her chest.  Does report history of similar.  Is actually scheduled to have an outpatient stress test tomorrow for further evaluation.  Took a baby aspirin.  Has nitro, but says this does not make her feel good, so she has not taken it.    Physical Exam   Triage Vital Signs: ED Triage Vitals [09/20/22 1556]  Enc Vitals Group     BP 134/88     Pulse Rate 94     Resp 18     Temp 98.1 F (36.7 C)     Temp Source Oral     SpO2 99 %     Weight 176 lb 5.9 oz (80 kg)     Height 5\' 1"  (1.549 m)     Head Circumference      Peak Flow      Pain Score 10     Pain Loc      Pain Edu?      Excl. in GC?     Most recent vital signs: Vitals:   09/20/22 1556  BP: 134/88  Pulse: 94  Resp: 18  Temp: 98.1 F (36.7 C)  SpO2: 99%     General: Awake, interactive  CV:  Regular rate, good peripheral perfusion.  Resp:  Lungs clear, unlabored respirations.  Abd:  Soft, nondistended.  Neuro:  Symmetric facial movement, fluid speech   ED Results / Procedures / Treatments   Labs (all labs ordered are listed, but only abnormal results are displayed) Labs Reviewed  BASIC METABOLIC PANEL - Abnormal; Notable for the following components:      Result Value   Potassium 2.9 (*)    Glucose, Bld 116 (*)    All other components within normal limits  CBC  POC URINE PREG, ED  TROPONIN I (HIGH SENSITIVITY)     EKG EKG independently reviewed interpreted by myself (ER attending) demonstrates:  EKG demonstrates normal sinus rhythm at a rate of 100, PR  180, QRS 80  RADIOLOGY Imaging independently reviewed and interpreted by myself demonstrates:  Chest x-Cybill Uriegas without evidence of pneumonia  PROCEDURES:  Critical Care performed: No  Procedures   MEDICATIONS ORDERED IN ED: Medications - No data to display   IMPRESSION / MDM / ASSESSMENT AND PLAN / ED COURSE  I reviewed the triage vital signs and the nursing notes.  Differential diagnosis includes, but is not limited to, ACS, pneumonia, pneumothorax, low risk PE and PERC negative, anemia, stress mediated response  Patient's presentation is most consistent with acute presentation with potential threat to life or bodily function.  47 year old female presenting with chest pain.  Lab work reassuring.  Potassium low at 2.9.  Potassium initially ordered, but patient reports she has this at home and would like to take it there.  EKG without acute ischemic changes.  Patient's pain has been ongoing for several hours.  I did discuss a repeat troponin, but patient would like to hold off which I do think is reasonable given much longer than 3 hours  of symptoms between onset of symptoms and initial blood draw.  She does have risk factors, but does have a stress test scheduled for tomorrow already.  With this, do think discharge is reasonable.  Strict return precautions were provided.  Patient was discharged in stable condition.      FINAL CLINICAL IMPRESSION(S) / ED DIAGNOSES   Final diagnoses:  Nonspecific chest pain     Rx / DC Orders   ED Discharge Orders     None        Note:  This document was prepared using Dragon voice recognition software and may include unintentional dictation errors.   Trinna Post, MD 09/20/22 [phone]

## 2022-09-20 NOTE — Telephone Encounter (Signed)
Pt called stating she is currently experiencing chest pressure that started this morning but report symptoms are now more intense.  Pt denies lightheadedness, dizziness, or any other symptoms. Pt very tearful on the phone and stated she feels her anxiety may have worsened symptoms as she reported she is concerned about the chest pressure.  Based on current symptoms, nurse recommended pt report to ER for further evaluations.  Pt verbalized understanding.

## 2022-09-20 NOTE — Telephone Encounter (Signed)
Pt c/o of Chest Pain: STAT if CP now or developed within 24 hours  1. Are you having CP right now? Yes, chest tightness   2. Are you experiencing any other symptoms (ex. SOB, nausea, vomiting, sweating)? No   3. How long have you been experiencing CP? 5 to 6 weeks   4. Is your CP continuous or coming and going? Coming and going   5. Have you taken Nitroglycerin? No    ?

## 2022-09-21 ENCOUNTER — Encounter
Admission: RE | Admit: 2022-09-21 | Discharge: 2022-09-21 | Disposition: A | Discharge status: Home / Self Care | Payer: 59 | Source: Ambulatory Visit | Attending: Physician Assistant | Admitting: Physician Assistant

## 2022-09-21 DIAGNOSIS — R072 Precordial pain: Secondary | ICD-10-CM | POA: Insufficient documentation

## 2022-09-25 ENCOUNTER — Encounter: Admission: RE | Admit: 2022-09-25 | Payer: 59 | Source: Ambulatory Visit

## 2022-09-28 ENCOUNTER — Encounter: Admission: RE | Admit: 2022-09-28 | Payer: 59 | Source: Ambulatory Visit

## 2022-10-04 ENCOUNTER — Other Ambulatory Visit: Payer: Self-pay

## 2022-10-04 MED ORDER — FLUCONAZOLE 100 MG PO TABS
100.0000 mg | ORAL_TABLET | Freq: Every day | ORAL | 0 refills | Status: DC
  Filled 2022-10-04: qty 14, 14d supply, fill #0

## 2022-10-09 ENCOUNTER — Ambulatory Visit: Payer: 59 | Attending: Physician Assistant

## 2022-10-10 ENCOUNTER — Other Ambulatory Visit: Payer: Self-pay

## 2022-10-10 ENCOUNTER — Other Ambulatory Visit: Payer: Self-pay | Admitting: Cardiovascular Disease

## 2022-10-10 ENCOUNTER — Ambulatory Visit: Payer: 59 | Attending: Cardiovascular Disease | Admitting: Cardiovascular Disease

## 2022-10-10 NOTE — Progress Notes (Deleted)
Cardiology Office Note   Date:  10/10/2022   ID:  Lisa Davila, DOB 09/03/1975, MRN [id number]  PCP:  Barbette Reichmann, MD  Cardiologist:   Lorine Bears, MD   No chief complaint on file.     History of Present Illness: Lisa Davila is a 47 y.o. female who is here today for a follow-up visit regarding coronary artery disease and chronic systolic heart failure.  She has known history of SVT status post previous EP study with no inducible arrhythmia, essential hypertension, hyperlipidemia, fibromyalgia, tobacco use, anxiety and hypokalemia. She presented in February 2023 with chest pain and was found to have inferior ST elevation.  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  There was mild nonobstructive disease affecting the left coronary arteries.  Echocardiogram showed an EF of 50 to 55%.  Brilinta was switched to clopidogrel due to persistent dyspnea. She had COVID-19 infection in May.  She had recurrent chest pain after that we will multiple emergency room visits. Repeat echocardiogram in April showed an EF of 35%.  Due to a drop in her ejection fraction and her symptoms, right and left cardiac catheterization was done in July, 2023 which showed patent RCA stent with mild in-stent restenosis and no evidence of obstructive disease affecting the left coronary arteries.  Ejection fraction was 35%.  Right heart catheterization showed mildly to moderately elevated filling pressures, minimal pulmonary hypertension and normal cardiac output.   She presented in November, 2023 with recurrent myocardial infarction.  Cardiac catheterization showed occluded RCA stent due to late stent thrombosis.  She underwent successful mechanical thrombectomy and drug eluting stent placement.  She was switched from clopidogrel to prasugrel.   Genotype testing subsequently showed intermediate 2C-19 metabolic activity. Echocardiogram showed an EF of 35 to 40%.  She quit smoking since her second myocardial infarction.    She was diagnosed with COVID-19 last week and had symptoms that included vomiting, diarrhea, fever and bodyaches.  Shortly after that, she started having substernal chest tightness but the episodes initially were short-lived.  However, she had a prolonged episode yesterday that lasted more than 30 minutes and was very similar to her most recent myocardial infarction.  She has not felt well since then and she continues to have chest tightness today.   Past Medical History:  Diagnosis Date   Acute ethmoidal sinusitis    Acute ethmoidal sinusitis    Acute maxillary sinusitis    Acute upper respiratory infections of unspecified site    Anxiety state, unspecified    Chest pain, unspecified    CHF (congestive heart failure) (HCC)    Coronary artery disease    Depression    Diarrhea    Esophageal reflux    Generalized anxiety disorder    Hyperlipidemia    Hypertension    Hypoglycemia, unspecified    Lumbago    Nausea with vomiting    Obstructive chronic bronchitis with exacerbation (HCC)    Other and unspecified hyperlipidemia    Other and unspecified peripheral vertigo(386.19)    Other bursitis disorders    Other malaise and fatigue    Other specified diseases due to viruses    Other vitamin B12 deficiency anemia    Pain in limb    Pernicious anemia    Sleep related hypoventilation/hypoxemia in conditions classifiable elsewhere    SVT (supraventricular tachycardia)    Swelling, mass,  or lump in head and neck    Tobacco use disorder    Unspecified disorder of external ear    Unspecified disorder of skin and subcutaneous tissue    Unspecified otitis media     Past Surgical History:  Procedure Laterality Date   CESAREAN SECTION     CORONARY STENT INTERVENTION N/A 03/14/2022   Procedure: CORONARY STENT  INTERVENTION;  Surgeon: Yvonne Kendall, MD;  Location: [facility];  Service: Cardiovascular;  Laterality: N/A;   CORONARY THROMBECTOMY N/A 03/14/2022   Procedure: Coronary Thrombectomy;  Surgeon: Yvonne Kendall, MD;  Location: [facility];  Service: Cardiovascular;  Laterality: N/A;   CORONARY/GRAFT ACUTE MI REVASCULARIZATION N/A 07/04/2021   Procedure: Coronary/Graft Acute MI Revascularization;  Surgeon: Iran Ouch, MD;  Location: [facility];  Service: Cardiovascular;  Laterality: N/A;   LEFT HEART CATH AND CORONARY ANGIOGRAPHY N/A 07/04/2021   Procedure: LEFT HEART CATH AND CORONARY ANGIOGRAPHY;  Surgeon: Iran Ouch, MD;  Location: [facility];  Service: Cardiovascular;  Laterality: N/A;   LEFT HEART CATH AND CORONARY ANGIOGRAPHY N/A 03/14/2022   Procedure: LEFT HEART CATH AND CORONARY ANGIOGRAPHY;  Surgeon: Yvonne Kendall, MD;  Location: [facility];  Service: Cardiovascular;  Laterality: N/A;   LEFT HEART CATH AND CORONARY ANGIOGRAPHY N/A 06/26/2022   Procedure: LEFT HEART CATH AND CORONARY ANGIOGRAPHY;  Surgeon: Iran Ouch, MD;  Location: [facility];  Service: Cardiovascular;  Laterality: N/A;   RIGHT/LEFT HEART CATH AND CORONARY ANGIOGRAPHY N/A 11/07/2021   Procedure: RIGHT/LEFT HEART CATH AND CORONARY ANGIOGRAPHY;  Surgeon: Iran Ouch, MD;  Location: [facility];  Service: Cardiovascular;  Laterality: N/A;     Current Outpatient Medications  Medication Sig Dispense Refill   albuterol (VENTOLIN HFA) 108 (90 Base) MCG/ACT inhaler Inhale 2 puffs into the lungs every 6 hours as needed for wheezing. 18 g 5   ALPRAZolam (XANAX) 0.5 MG tablet Take 1 tablet (0.5 mg total) by mouth 3 (three) times daily as needed for Sleep 90 tablet 2   ALPRAZolam (XANAX) 0.5 MG tablet Take 1 tablet (0.5 mg total) by mouth 3 (three) times daily as needed for sleep. (Patient not taking: Reported on 09/08/2022) 90 tablet 2    aspirin 81 MG chewable tablet Chew 1 tablet (81 mg total) by mouth daily.     chlorpheniramine-HYDROcodone (TUSSIONEX) 10-8 MG/5ML Take 5 mLs by mouth every 12 (twelve) hours as needed for up to 7 days (Patient not taking: Reported on 06/26/2022) 70 mL 0   Cholecalciferol 1.25 MG (50000 UT) TABS Take 1.25 tablets by mouth once a week.     cyanocobalamin (VITAMIN B12) 1000 MCG tablet Take 1 tablet (1,000 mcg total) by mouth once daily 90 tablet 1   ergocalciferol (VITAMIN D2) 1.25 MG (50000 UT) capsule Take 1 capsule (50,000 Units total) by mouth once a week. 12 capsule 1   Evolocumab (REPATHA SURECLICK) 140 MG/ML SOAJ Inject 140 mg into the skin every 14 (fourteen) days. 2 mL 2   fluconazole (DIFLUCAN) 100 MG tablet Take 1 tablet (100 mg total) by mouth daily for 14 days. 14 tablet 0   ipratropium-albuterol (DUONEB) 0.5-2.5 (3) MG/3ML SOLN Take 3 mls by nebulization 4 (four) times daily for 30 days 360 mL 1   metoprolol succinate (TOPROL-XL) 50 MG 24 hr tablet Take 1 tablet (50 mg total) by mouth daily. 90 tablet 3   metoprolol succinate (TOPROL-XL) 50 MG 24 hr tablet Take  1 tablet (50 mg total) by mouth daily. (Patient not taking: Reported on 09/08/2022) 30 tablet 3   nicotine (NICODERM CQ - DOSED IN MG/24 HOURS) 14 mg/24hr patch Place 1 patch onto the skin daily 28 patch 0   nitroGLYCERIN (NITROSTAT) 0.4 MG SL tablet Place 1 tablet (0.4 mg total) under the tongue every 5 (five) minutes as needed for chest pain. 50 tablet 0   ondansetron (ZOFRAN) 4 MG tablet Take 1 tablet (4 mg total) by mouth every 8 (eight) hours as needed for nausea for up to 7 days (Patient not taking: Reported on 09/08/2022) 20 tablet 0   ondansetron (ZOFRAN) 4 MG tablet Take 1 tablet (4 mg total) by mouth every 8 (eight) hours as needed for Nausea for up to 7 days (Patient not taking: Reported on 09/08/2022) 20 tablet 0   ondansetron (ZOFRAN-ODT) 4 MG disintegrating tablet Take 1 tablet (4 mg total) by mouth every 8 (eight) hours as  needed for nausea (Patient not taking: Reported on 09/08/2022) 20 tablet 0   potassium chloride SA (KLOR-CON M) 20 MEQ tablet Take 2 tablets (40 mEq total) by mouth daily. 180 tablet 3   prasugrel (EFFIENT) 10 MG TABS tablet Take 1 tablet (10 mg total) by mouth daily. (Patient not taking: Reported on 06/23/2022) 90 tablet 3   prasugrel (EFFIENT) 10 MG TABS tablet Take 1 tablet (10 mg total) by mouth daily. 90 tablet 3   predniSONE (STERAPRED UNI-PAK 21 TAB) 10 MG (21) TBPK tablet Take by mouth daily. As directed (Patient not taking: Reported on 09/08/2022) 21 tablet 0   rosuvastatin (CRESTOR) 40 MG tablet Take 1 tablet (40 mg total) by mouth daily. 90 tablet 3   rosuvastatin (CRESTOR) 40 MG tablet Take 1 tablet (40 mg total) by mouth daily. (Patient not taking: Reported on 09/08/2022) 90 tablet 3   spironolactone (ALDACTONE) 25 MG tablet Take 0.5 tablets (12.5 mg total) by mouth daily. 45 tablet 3   No current facility-administered medications for this visit.    Allergies:   Buspirone, Citalopram, Metrizamide, Sulfa antibiotics, Contrast media [iodinated contrast media], Sulfa drugs cross reactors, and Sulfasalazine    Social History:  The patient  reports that she quit smoking about 6 months ago. Her smoking use included cigarettes. She has never used smokeless tobacco. She reports that she does not drink alcohol and does not use drugs.   Family History:  The patient's family history includes Breast cancer in her maternal grandmother; Cancer in her father and paternal grandfather; Depression in her mother; Hypertension in her sister.    ROS:  Please see the history of present illness.   Otherwise, review of systems are positive for none.   All other systems are reviewed and negative.    PHYSICAL EXAM: VS:  There were no vitals taken for this visit. , BMI There is no height or weight on file to calculate BMI. GEN: Well nourished, well developed, in no acute distress  HEENT: normal  Neck: no JVD,  carotid bruits, or masses Cardiac: RRR; no murmurs, rubs, or gallops,no edema  Respiratory:  clear to auscultation bilaterally, normal work of breathing GI: soft, nontender, nondistended, + BS MS: no deformity or atrophy  Skin: warm and dry, no rash Neuro:  Strength and sensation are intact Psych: euthymic mood, full affect Radial pulses normal bilaterally.   EKG:  EKG is ordered today. EKG showed sinus tachycardia with prior inferior infarct.  Ventricular bigeminy   Recent Labs: 03/13/2022: B Natriuretic Peptide 92.8;  Magnesium 2.0 09/20/2022: BUN 6; Creatinine, Ser 0.79; Hemoglobin 14.2; Platelets 318; Potassium 2.9; Sodium 139    Lipid Panel    Component Value Date/Time   CHOL 201 (H) 03/14/2022 0148   CHOL 280 (H) 11/29/2011 1326   TRIG 180 (H) 03/14/2022 0148   TRIG 186 11/29/2011 1326   HDL 28 (L) 03/14/2022 0148   HDL 29 (L) 11/29/2011 1326   CHOLHDL 7.2 03/14/2022 0148   VLDL 36 03/14/2022 0148   VLDL 37 11/29/2011 1326   LDLCALC 137 (H) 03/14/2022 0148   LDLCALC 214 (H) 11/29/2011 1326      Wt Readings from Last 3 Encounters:  09/20/22 176 lb 5.9 oz (80 kg)  09/08/22 177 lb 12.8 oz (80.6 kg)  06/26/22 160 lb (72.6 kg)          10/26/2021    3:07 PM  PAD Screen  Previous PAD dx? No  Previous surgical procedure? No  Pain with walking? No  Feet/toe relief with dangling? No  Painful, non-healing ulcers? No  Extremities discolored? No      ASSESSMENT AND PLAN:  1.  Coronary artery disease involving native coronary arteries with unstable angina: Unfortunately, the patient is having chest tightness at rest that has been persistent since yesterday.  This is very concerning given her known history of previous inferior MI and very late stent thrombosis.  She continues to be on dual antiplatelet therapy with aspirin and prasugrel.   Her EKG today shows ventricular bigeminy and she is tachycardic. I recommend transferring the patient to the ED with anticipated  hospital admission.  Recommend starting heparin drip and plan on left heart catheterization on Monday or earlier if she becomes unstable.   2.  Chronic systolic heart failure: Most recent ejection fraction was 35 to 40%.  Continue Toprol 50 mg once daily.  She reports that Marcelline Deist was not covered by her insurance.  She is not on Entresto due to low blood pressure but we might need to consider a small dose ARB.  However, the priority might be to try to increase the dose of Toprol given her resting tachycardia.  3.  Paroxysmal supraventricular tachycardia: She is mostly having issues with sinus tachycardia at the present time.  Continue Toprol 50 mg once daily.  Consider ivabradine  4.  Hyperlipidemia: Continue rosuvastatin.  5.  Prior tobacco use: She quit smoking after her most recent myocardial infarction with no relapse.  6.  Ventricular bigeminy: Will check electrolytes as she has known history of hypokalemia that possibly worsened in the setting of recent GI symptoms and diarrhea.  In addition, this might be due to an ischemic etiology.   Disposition: Transfer to the ED for evaluation with intention of admission.  Recommend starting heparin drip with anticipation of cardiac catheterization on Monday.  Signed,  Lorine Bears, MD  10/10/2022 3:34 PM    [facility]

## 2022-10-11 ENCOUNTER — Other Ambulatory Visit: Payer: Self-pay

## 2022-10-12 ENCOUNTER — Other Ambulatory Visit: Payer: Self-pay

## 2022-10-12 ENCOUNTER — Encounter: Payer: Self-pay | Admitting: Cardiovascular Disease

## 2022-10-12 ENCOUNTER — Telehealth: Payer: Self-pay | Admitting: Student

## 2022-10-12 NOTE — Telephone Encounter (Signed)
   Patient called the Answering Service with concerns about weight gain in edema.  Called and spoke with patient.  She states she has gained 20 pounds in the last week and describes lower extremity edema and abdominal bloating/distention.  She also reports some mild dyspnea on exertion if she is walking too fast as well as some occasional orthopnea and PND which she states has been more frequent over the last week.  She denies any chest pain.  She does not sound short of breath on the phone.  She was previously on Lasix but says this was stopped after her most recent heart attack.  It does sound like she is significantly volume overloaded; however, I do not feel comfortable restarting her PO Lasix because her potassium was 2.9 at her recent ED visit on 09/20/2022.  We do not have any repeat labs since that time.  Of note, she is also on Spironolactone and KCl.  I explained that if she truly has had a 20 pound weight gain due to fluid in the last week, she likely needs IV diuresis.  However, she would really like to avoid going to the hospital.  Given she is not currently having any short of breath, I will see whether we can arrange an office visit in the next couple of days to assess her.I did explain to her that she still may end up needing to go to the hospital for IV diuresis.  She voiced understanding.    The earliest appointment I was able to find in our [place] office was for 10/20/2022 which I think is too far from now.  I will route this message to the triage pool to see if they are able to find anything sooner - preferable early next week (she can go to our [place] office if that is the only place with any opening).  Discussed the importance of going to the emergency department if she develops any acute shortness of breath or new onset of chest pain before office visit.  Patient voiced understanding and agreed.  Corrin Parker, PA-C 10/12/2022 6:31 PM

## 2022-10-13 ENCOUNTER — Other Ambulatory Visit: Payer: Self-pay

## 2022-10-13 NOTE — Telephone Encounter (Signed)
Called the patient to follow up. She stated that she has gained 20 pounds in one week of fluid around her abdomen. She was not audibly short of breath on the phone.   She wants an appointment today to be seen and has been advised that we do not have anything. She had an appointment on 6/4 with Dr. Kirke Corin that she no showed. She stated that she canceled it because she couldn't make it. She was advised that this was not canceled and that she also no showed her echo appointment on 6/3.   She stated that she was told that we could get her in for an appointment and has been advised that according to two separate notes that she was advised to go to the ED to be seen as her potassium was 2.9 on 5/15 and she may need IV diuretics. She stated that she refuses to go to the ED.  An appointment was made with Dr. Kirke Corin on 6/12 but she was again advised to go to the ED since she has gained 20 pounds in one week. Patient refused.

## 2022-10-16 ENCOUNTER — Encounter: Payer: Self-pay | Admitting: Emergency Medicine

## 2022-10-16 ENCOUNTER — Emergency Department: Payer: 59

## 2022-10-16 ENCOUNTER — Other Ambulatory Visit: Payer: Self-pay

## 2022-10-16 ENCOUNTER — Emergency Department
Admission: EM | Admit: 2022-10-16 | Discharge: 2022-10-17 | Disposition: A | Discharge status: Home / Self Care | Payer: 59 | Attending: Emergency Medicine | Admitting: Emergency Medicine

## 2022-10-16 DIAGNOSIS — R079 Chest pain, unspecified: Secondary | ICD-10-CM | POA: Diagnosis not present

## 2022-10-16 DIAGNOSIS — Z8541 Personal history of malignant neoplasm of cervix uteri: Secondary | ICD-10-CM | POA: Diagnosis not present

## 2022-10-16 DIAGNOSIS — I110 Hypertensive heart disease with heart failure: Secondary | ICD-10-CM | POA: Diagnosis not present

## 2022-10-16 DIAGNOSIS — R0789 Other chest pain: Secondary | ICD-10-CM | POA: Insufficient documentation

## 2022-10-16 DIAGNOSIS — E7800 Pure hypercholesterolemia, unspecified: Secondary | ICD-10-CM | POA: Diagnosis not present

## 2022-10-16 DIAGNOSIS — E876 Hypokalemia: Secondary | ICD-10-CM | POA: Diagnosis not present

## 2022-10-16 DIAGNOSIS — I2510 Atherosclerotic heart disease of native coronary artery without angina pectoris: Secondary | ICD-10-CM | POA: Diagnosis not present

## 2022-10-16 DIAGNOSIS — E785 Hyperlipidemia, unspecified: Secondary | ICD-10-CM | POA: Diagnosis not present

## 2022-10-16 DIAGNOSIS — I5022 Chronic systolic (congestive) heart failure: Secondary | ICD-10-CM | POA: Insufficient documentation

## 2022-10-16 DIAGNOSIS — K625 Hemorrhage of anus and rectum: Secondary | ICD-10-CM | POA: Diagnosis not present

## 2022-10-16 LAB — BASIC METABOLIC PANEL
Anion gap: 8 (ref 5–15)
BUN: 8 mg/dL (ref 6–20)
CO2: 27 mmol/L (ref 22–32)
Calcium: 9.2 mg/dL (ref 8.9–10.3)
Chloride: 105 mmol/L (ref 98–111)
Creatinine, Ser: 0.85 mg/dL (ref 0.44–1.00)
GFR, Estimated: >60 mL/min (ref >60)
Glucose, Bld: 98 mg/dL (ref 70–99)
Potassium: 3 mmol/L — ABNORMAL LOW (ref 3.5–5.1)
Sodium: 140 mmol/L (ref 135–145)

## 2022-10-16 LAB — TROPONIN I (HIGH SENSITIVITY): Troponin I (High Sensitivity): 5 ng/L (ref <18)

## 2022-10-16 LAB — CBC
HCT: 40 % (ref 36.0–46.0)
Hemoglobin: 13.4 g/dL (ref 12.0–15.0)
MCH: 31.5 pg (ref 26.0–34.0)
MCHC: 33.5 g/dL (ref 30.0–36.0)
MCV: 93.9 fL (ref 80.0–100.0)
Platelets: 304 10*3/uL (ref 150–400)
RBC: 4.26 MIL/uL (ref 3.87–5.11)
RDW: 12.6 % (ref 11.5–15.5)
WBC: 8.7 10*3/uL (ref 4.0–10.5)
nRBC: 0 % (ref 0.0–0.2)

## 2022-10-16 MED ORDER — POTASSIUM CHLORIDE CRYS ER 20 MEQ PO TBCR
40.0000 meq | EXTENDED_RELEASE_TABLET | Freq: Once | ORAL | Status: AC
  Administered 2022-10-16: 40 meq via ORAL
  Filled 2022-10-16: qty 2

## 2022-10-16 NOTE — ED Notes (Signed)
No answer when called several times from lobby 

## 2022-10-16 NOTE — ED Provider Notes (Signed)
[facility] Provider Note    Event Date/Time   First MD Initiated Contact with Patient 10/16/22 2314     (approximate)   History   No chief complaint on file.   HPI  Lisa Davila is a 47 y.o. female with past medical history of CHF NSTEMI who presents because of chest patient noticed this about 2 hours prior to.  She was sitting down not doing anything when it started describes a tight feeling in the right side of her chest that does not radiate.  She did feel somewhat short of breath when it first started.  The tightness has improved and just has a mild residual pressure just this feels similar to the episodes of chest tightness that she has had over the last several months.  Tells this feels very different from when she had her NSTEMI she was very sweaty.  Says every time she has an episode that she does come to the ED because she is worried about having another NSTEMI.  Patient is also concerned about increasing abdominal girth and bloating feeling.  This has been going on for several days.  She does feel like her right ankle is more swollen than the left.  She did talk to her doctor who did not want to put her on Lasix because her potassium was low.  She has not been on Lasix since she had her last NSTEMI in November.  She does endorse some dyspnea on exertion but this is more chronic issue.  Think she is gained about 10 pounds in a week.   Reviewed patient's last cardiology visit on 09/08/2022.  Patient has had 2 NTEMIS requiring PCI.  Has had multiple ED presentations in the last several months.  Last cath was performed on 06/26/2022 after patient had recurrent visits for chest tightness and did not show any significant restenosis.    Past Medical History:  Diagnosis Date   Acute ethmoidal sinusitis    Acute ethmoidal sinusitis    Acute maxillary sinusitis    Acute upper respiratory infections of unspecified site    Anxiety state, unspecified    Chest  pain, unspecified    CHF (congestive heart failure) (HCC)    Coronary artery disease    Depression    Diarrhea    Esophageal reflux    Generalized anxiety disorder    Hyperlipidemia    Hypertension    Hypoglycemia, unspecified    Lumbago    Nausea with vomiting    Obstructive chronic bronchitis with exacerbation (HCC)    Other and unspecified hyperlipidemia    Other and unspecified peripheral vertigo(386.19)    Other bursitis disorders    Other malaise and fatigue    Other specified diseases due to viruses    Other vitamin B12 deficiency anemia    Pain in limb    Pernicious anemia    Sleep related hypoventilation/hypoxemia in conditions classifiable elsewhere    SVT (supraventricular tachycardia)    Swelling, mass, or lump in head and neck    Tobacco use disorder    Unspecified disorder of external ear    Unspecified disorder of skin and subcutaneous tissue    Unspecified otitis media     Patient Active Problem List   Diagnosis Date Noted   NSTEMI (non-ST elevated myocardial infarction) (HCC) 03/13/2022   Hypotension 03/13/2022   Chronic systolic heart failure (HCC)    Coronary artery disease    ST elevation myocardial infarction (STEMI) of inferior wall (  HCC) 07/04/2021   SVT (supraventricular tachycardia) 07/04/2021   Depression with anxiety 07/04/2021   Hyperlipidemia 07/04/2021   Tobacco user 07/04/2021   Leukocytosis 07/04/2021   Diarrhea 07/04/2021   Hx of cervical cancer 06/01/2020   Hypertension 06/01/2020   Migraines 06/01/2020   Sialolithiasis 06/01/2020   Tobacco use disorder 06/01/2020   Hoarseness 04/06/2020   Oral thrush 04/06/2020   Leg cramping 03/25/2020   Iatrogenic adrenal insufficiency (HCC) 02/20/2020   Hypokalemia 01/25/2020   Weakness of both legs 01/25/2020   Nontraumatic incomplete tear of left rotator cuff 01/20/2019   Tendinitis of upper biceps tendon of left shoulder 01/20/2019   Rotator cuff tendinitis, left 12/30/2018    Palpitation 01/08/2018   Hypercholesteremia 01/08/2018   Stress at work 01/08/2018   Trochanteric bursitis of left hip 11/30/2016   Benzodiazepine misuse 05/03/2016   Cervical cancer screening 05/03/2016   Chronic sinusitis 03/16/2015   Nasal obstruction 03/16/2015   High risk medication use 11/03/2014   Other long term (current) drug therapy 11/03/2014   Polypharmacy 11/03/2014   Arthralgia of temporomandibular joint, unspecified side 10/24/2013   Carpal tunnel syndrome 08/04/2013   Lateral epicondylitis 08/04/2013   Knee pain 02/03/2013   Muscle strain 02/03/2013   Pain in left knee 02/03/2013   Shoulder pain 02/03/2013   Dysphagia, pharyngeal 09/11/2012   Cardiac arrhythmia, unspecified 07/29/2012   Contraceptive management 07/29/2012   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   Contraception 06/18/2012   Fatigue 06/18/2012   Stye 03/21/2012   Nausea 02/23/2012   Fibromyalgia 01/17/2012   Myalgia and myositis 01/17/2012   Deficiency of other specified B group vitamins 07/19/2011   Depression 07/19/2011   Gastro-esophageal reflux disease without esophagitis 07/19/2011   Generalized anxiety disorder 07/19/2011   Low back pain 07/19/2011     Physical Exam  Triage Vital Signs: ED Triage Vitals  Enc Vitals Group     BP 10/16/22 2215 113/79     Pulse Rate 10/16/22 2215 81     Resp 10/16/22 2215 18     Temp 10/16/22 2215 98.2 F (36.8 C)     Temp Source 10/16/22 2215 Oral     SpO2 10/16/22 2215 98 %     Weight 10/16/22 2209 160 lb (72.6 kg)     Height 10/16/22 2209 5\' 1"  (1.549 m)     Head Circumference --      Peak Flow --      Pain Score 10/16/22 2209 7     Pain Loc --      Pain Edu? --      Excl. in GC? --     Most recent vital signs: Vitals:   10/16/22 2215 10/17/22 0115  BP: 113/79 109/72  Pulse: 81 71  Resp: 18 16  Temp: 98.2 F (36.8 C) 98.2 F (36.8 C)  SpO2: 98% 98%     General: Awake, no distress.  CV:  Good peripheral  perfusion. No pitting edema Resp:  Normal effort. Lungs are clear Abd:  No distention. Soft, nontender  Neuro:             Awake, Alert, Oriented x 3  Other:     ED Results / Procedures / Treatments  Labs (all labs ordered are listed, but only abnormal results are displayed) Labs Reviewed  BASIC METABOLIC PANEL - Abnormal; Notable for the following components:      Result Value   Potassium 3.0 (*)    All other components within normal limits  CBC  TROPONIN I (HIGH SENSITIVITY)  TROPONIN I (HIGH SENSITIVITY)     EKG  The patient EKG which shows sinus rhythm.  Q waves and T wave inversions in 3 aVF V3 V4 V5, similar to prior 09/20/22   RADIOLOGY I reviewed and interpreted the CXR which does not show any acute cardiopulmonary process    PROCEDURES:  Critical Care performed: No  Procedures  The patient is on the cardiac monitor to evaluate for evidence of arrhythmia and/or significant heart rate changes.   MEDICATIONS ORDERED IN ED: Medications  potassium chloride SA (KLOR-CON M) CR tablet 40 mEq (40 mEq Oral Given 10/16/22 2355)     IMPRESSION / MDM / ASSESSMENT AND PLAN / ED COURSE  I reviewed the triage vital signs and the nursing notes.                              Patient's presentation is most consistent with acute presentation with potential threat to life or bodily function.  Differential diagnosis includes, but is not limited to, stable angina, unstable angina, CHF, noncardiac pain, including musculoskeletal, GI related, anxiety  The patient is a 47 year old female with past medical history of HFrEF and NSTEMI x 2 female who presents because of chest pain.  This started about 2 hours prior to arrival and 8 PM.  Started while at rest.  Describes as a tight feeling which is significantly improved and is just having some subtle pressure.  He feels short of breath initially but this is resolved no diaphoresis or nausea.  Patient tells me this feels similar to  recent episodes of chest tightness for which she has come to the ED for.  Patient back in February after presentations for chest pain did not show any culprit lesion.  She was last seen in May by cardiology and there was noted to be some more prominent anterior precordial T wave inversions that she is scheduled for a stress test which she tells me she had 2 weeks ago, but does not know the results.  I do see that patient no showed for the initial stress test and I am not able to see the results of the stress test that was performed.  Patient is more concerned today about increasing abdominal girth over the last week or so.  She is concerned about this.  Tells me she has gained 10 pounds in 1 week.  Patient's weight today is 72 kg and about a month ago in the ED was 80kg, so I am convinced that she has had significant weight gain.  She looks euvolemic lung sounds are clear she has no pitting edema.  Abdomen mildly distended but unclear what her baseline is.  It is nontender. Patient's chest x-ray does not show any pulm edema.  EKG appears similar to prior.  Patient's troponin is negative.  Potassium is low at 3.  She is on potassium supplementation was told today by her primary doctor to double the dose.  At this point my suspicion for an acute episode of ischemic chest pain is low.  Seems like patient has had multiple similar presentations for chest discomfort recently,  and had a similar symptoms prior to cath, but cath but did not show any culprit lesion.  Additionally her EKG looks very similar today to what it looked like when she was in the office last month.  Patient thinks that her abdominal distention is related to her CHF  but she does not have objective weight gain and appears euvolemic with no pulmonary edema on chest x-ray.  Given her hypokalemia I feel we should avoid  Lasix and explained this to her.  I do think that she should have cardiology follow-up as there was plan for a nuclear stress test  which she never followed up to have performed.  Patient was resistant to obtaining repeat troponin but I was able to convince her to stay for repeat troponin.  Repeat troponin is negative.  Patient has no new or worsening chest pain.  Tells me she is seeing cardiology in 2 days which is reassuring.     FINAL CLINICAL IMPRESSION(S) / ED DIAGNOSES   Final diagnoses:  Hypokalemia  Chest pain, unspecified type     Rx / DC Orders   ED Discharge Orders     None        Note:  This document was prepared using Dragon voice recognition software and may include unintentional dictation errors.   Georga Hacking, MD 10/17/22 [phone]

## 2022-10-16 NOTE — ED Triage Notes (Signed)
Patient ambulatory to triage with steady gait, without difficulty or distress noted; pt reports mid CP nonradiating with no accomp symptoms

## 2022-10-17 DIAGNOSIS — R0789 Other chest pain: Secondary | ICD-10-CM | POA: Diagnosis not present

## 2022-10-17 LAB — TROPONIN I (HIGH SENSITIVITY): Troponin I (High Sensitivity): 5 ng/L (ref <18)

## 2022-10-17 NOTE — Discharge Instructions (Signed)
Your potassium level is 3.  Please increase your potassium supplementation as recommended by your doctor.  Please follow-up with cardiology.  Your heart enzymes were reassuring as was your EKG.  Your chest x-ray did not show any evidence of fluid in your lungs therefore we did not start you on Lasix.

## 2022-10-18 ENCOUNTER — Ambulatory Visit: Payer: 59 | Attending: Cardiovascular Disease | Admitting: Cardiovascular Disease

## 2022-10-18 ENCOUNTER — Other Ambulatory Visit: Payer: Self-pay

## 2022-10-18 ENCOUNTER — Encounter: Payer: Self-pay | Admitting: Cardiovascular Disease

## 2022-10-18 VITALS — BP 104/64 | HR 88 | Ht 61.0 in | Wt 178.5 lb

## 2022-10-18 DIAGNOSIS — R072 Precordial pain: Secondary | ICD-10-CM

## 2022-10-18 DIAGNOSIS — I5022 Chronic systolic (congestive) heart failure: Secondary | ICD-10-CM | POA: Diagnosis not present

## 2022-10-18 DIAGNOSIS — I25118 Atherosclerotic heart disease of native coronary artery with other forms of angina pectoris: Secondary | ICD-10-CM | POA: Diagnosis not present

## 2022-10-18 DIAGNOSIS — I471 Supraventricular tachycardia, unspecified: Secondary | ICD-10-CM | POA: Diagnosis not present

## 2022-10-18 DIAGNOSIS — E785 Hyperlipidemia, unspecified: Secondary | ICD-10-CM

## 2022-10-18 MED ORDER — SPIRONOLACTONE 25 MG PO TABS
25.0000 mg | ORAL_TABLET | Freq: Every day | ORAL | 0 refills | Status: DC
  Filled 2022-10-18 – 2022-11-13 (×2): qty 30, 30d supply, fill #0
  Filled 2022-12-18: qty 30, 30d supply, fill #1
  Filled 2023-03-20: qty 30, 30d supply, fill #2

## 2022-10-18 NOTE — Patient Instructions (Addendum)
Medication Instructions:  INCREASE the Spironolactone to 25 mg once daily  *If you need a refill on your cardiac medications before your next appointment, please call your pharmacy*   Lab Work: None ordered If you have labs (blood work) drawn today and your tests are completely normal, you will receive your results only by: MyChart Message (if you have MyChart) OR A paper copy in the mail If you have any lab test that is abnormal or we need to change your treatment, we will call you to review the results.   Testing/Procedures: Your provider has ordered a Exercise Myoview Stress test. This will take place at [facility]. Please report to the [facility] medical mall entrance. The volunteers at the first desk will direct you where to go.  [facility] MYOVIEW  Your provider has ordered a Stress Test with nuclear imaging. The purpose of this test is to evaluate the blood supply to your heart muscle. This procedure is referred to as a "Non-Invasive Stress Test." This is because other than having an IV started in your vein, nothing is inserted or "invades" your body. Cardiac stress tests are done to find areas of poor blood flow to the heart by determining the extent of coronary artery disease (CAD). Some patients exercise on a treadmill, which naturally increases the blood flow to your heart, while others who are unable to walk on a treadmill due to physical limitations will have a pharmacologic/chemical stress agent called Lexiscan . This medicine will mimic walking on a treadmill by temporarily increasing your coronary blood flow.   Please note: these test may take anywhere between 2-4 hours to complete  How to prepare for your Myoview test:  Nothing to eat for 6 hours prior to the test No caffeine for 24 hours prior to test No smoking 24 hours prior to test. Your medication may be taken with water.  If your doctor stopped a medication because of this test, do not take that medication. Ladies, please do not wear  dresses.  Skirts or pants are appropriate. Please wear a short sleeve shirt. No perfume, cologne or lotion. Wear comfortable walking shoes. No heels! Hold the Metoprolol the morning of the test   PLEASE NOTIFY THE OFFICE AT LEAST 24 HOURS IN ADVANCE IF YOU ARE UNABLE TO KEEP YOUR APPOINTMENT.  [phone] AND  PLEASE NOTIFY NUCLEAR MEDICINE AT [facility] AT LEAST 24 HOURS IN ADVANCE IF YOU ARE UNABLE TO KEEP YOUR APPOINTMENT. [phone]    Follow-Up: At [facility], you and your health needs are our priority.  As part of our continuing mission to provide you with exceptional heart care, we have created designated Provider Care Teams.  These Care Teams include your primary Cardiologist (physician) and Advanced Practice Providers (APPs -  Physician Assistants and Nurse Practitioners) who all work together to provide you with the care you need, when you need it.  We recommend signing up for the patient portal called "MyChart".  Sign up information is provided on this After Visit Summary.  MyChart is used to connect with patients for Virtual Visits (Telemedicine).  Patients are able to view lab/test results, encounter notes, upcoming appointments, etc.  Non-urgent messages can be sent to your provider as well.   To learn more about what you can do with MyChart, go to [email or web address].    Your next appointment:   1 month(s)  Provider:   You may see Lorine Bears, MD or one of the following Advanced Practice Providers on your designated Care Team:  Nicolasa Ducking, NP Eula Listen, PA-C Cadence Fransico Michael, PA-C Charlsie Quest, NP

## 2022-10-18 NOTE — Progress Notes (Signed)
Cardiology Office Note   Date:  10/18/2022   ID:  CHAUNTAE Davila, DOB [birth year], MRN [id number]  PCP:  Barbette Reichmann, MD  Cardiologist:   Lorine Bears, MD   Chief Complaint  Patient presents with   Follow-up    Chest pain/tightness and Edema. Meds reviewed verbally with pt.      History of Present Illness: Lisa Davila is a 47 y.o. female who is here today for a follow-up visit regarding coronary artery disease and chronic systolic heart failure.  She has known history of SVT status post previous EP study with no inducible arrhythmia, essential hypertension, hyperlipidemia, fibromyalgia, tobacco use, anxiety and hypokalemia. She presented in February 2023 with chest pain and was found to have inferior ST elevation.  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  There was mild nonobstructive disease affecting the left coronary arteries.  Echocardiogram showed an EF of 50 to 55%.  Brilinta was switched to clopidogrel due to persistent dyspnea.  She had recurrent chest pain after that we will multiple emergency room visits. Repeat echocardiogram in April of 2023 showed an EF of 35%.  Due to a drop in her ejection fraction and her symptoms, right and left cardiac catheterization was done in July, 2023 which showed patent RCA stent with mild in-stent restenosis and no evidence of obstructive disease affecting the left coronary arteries.  Ejection fraction was 35%.  Right heart catheterization showed mildly to moderately elevated filling pressures, minimal pulmonary hypertension and normal cardiac output.   She presented in November, 2023 with recurrent myocardial infarction.  Cardiac catheterization showed occluded RCA stent due to late stent thrombosis.  She underwent successful mechanical thrombectomy and drug eluting  stent placement.  She was switched from clopidogrel to prasugrel.  Genotype testing subsequently showed intermediate 2C-19 metabolic activity. Echocardiogram showed an EF of 35 to 40%.  She quit smoking since her second myocardial infarction.    She had recurrent chest pain in February of this year and underwent repeat left heart catheterization which showed widely patent RCA stents with no significant restenosis, mildly reduced LV systolic function with an EF of 45 to 50% with normal left ventricular end-diastolic pressure.  She continues to struggle with anxiety and reports worsening substernal chest pain and tightness which happens both at rest and with exertion.  She had a recent emergency room visit and her troponin was normal.  She also complains of abdominal bloating and few pounds of weight gain but no lower extremity edema.   Past Medical History:  Diagnosis Date   Acute ethmoidal sinusitis    Acute ethmoidal sinusitis    Acute maxillary sinusitis    Acute upper respiratory infections of unspecified site    Anxiety state, unspecified    Chest pain, unspecified    CHF (congestive heart failure) (HCC)    Coronary artery disease    Depression    Diarrhea    Esophageal reflux    Generalized anxiety disorder    Hyperlipidemia    Hypertension    Hypoglycemia, unspecified    Lumbago    Nausea with vomiting    Obstructive chronic bronchitis with exacerbation (HCC)    Other and unspecified hyperlipidemia    Other and unspecified peripheral vertigo(386.19)    Other bursitis disorders    Other malaise and fatigue    Other specified diseases due to viruses  Other vitamin B12 deficiency anemia    Pain in limb    Pernicious anemia    Sleep related hypoventilation/hypoxemia in conditions classifiable elsewhere    SVT (supraventricular tachycardia)    Swelling, mass, or lump in head and neck    Tobacco use disorder    Unspecified disorder of external ear    Unspecified disorder  of skin and subcutaneous tissue    Unspecified otitis media     Past Surgical History:  Procedure Laterality Date   CESAREAN SECTION     CORONARY STENT INTERVENTION N/A 03/14/2022   Procedure: CORONARY STENT INTERVENTION;  Surgeon: Yvonne Kendall, MD;  Location: [facility];  Service: Cardiovascular;  Laterality: N/A;   CORONARY THROMBECTOMY N/A 03/14/2022   Procedure: Coronary Thrombectomy;  Surgeon: Yvonne Kendall, MD;  Location: [facility];  Service: Cardiovascular;  Laterality: N/A;   CORONARY/GRAFT ACUTE MI REVASCULARIZATION N/A 07/04/2021   Procedure: Coronary/Graft Acute MI Revascularization;  Surgeon: Iran Ouch, MD;  Location: [facility];  Service: Cardiovascular;  Laterality: N/A;   LEFT HEART CATH AND CORONARY ANGIOGRAPHY N/A 07/04/2021   Procedure: LEFT HEART CATH AND CORONARY ANGIOGRAPHY;  Surgeon: Iran Ouch, MD;  Location: [facility];  Service: Cardiovascular;  Laterality: N/A;   LEFT HEART CATH AND CORONARY ANGIOGRAPHY N/A 03/14/2022   Procedure: LEFT HEART CATH AND CORONARY ANGIOGRAPHY;  Surgeon: Yvonne Kendall, MD;  Location: [facility];  Service: Cardiovascular;  Laterality: N/A;   LEFT HEART CATH AND CORONARY ANGIOGRAPHY N/A 06/26/2022   Procedure: LEFT HEART CATH AND CORONARY ANGIOGRAPHY;  Surgeon: Iran Ouch, MD;  Location: [facility];  Service: Cardiovascular;  Laterality: N/A;   RIGHT/LEFT HEART CATH AND CORONARY ANGIOGRAPHY N/A 11/07/2021   Procedure: RIGHT/LEFT HEART CATH AND CORONARY ANGIOGRAPHY;  Surgeon: Iran Ouch, MD;  Location: [facility];  Service: Cardiovascular;  Laterality: N/A;     Current Outpatient Medications  Medication Sig Dispense Refill   albuterol (VENTOLIN HFA) 108 (90 Base) MCG/ACT inhaler Inhale 2 puffs into the lungs every 6 hours as needed for wheezing. 18 g 5   ALPRAZolam (XANAX) 0.5 MG tablet Take 1 tablet (0.5 mg total) by mouth 3 (three)  times daily as needed for Sleep 90 tablet 2   aspirin 81 MG chewable tablet Chew 1 tablet (81 mg total) by mouth daily.     cyanocobalamin (VITAMIN B12) 1000 MCG tablet Take 1 tablet (1,000 mcg total) by mouth once daily 90 tablet 1   fluconazole (DIFLUCAN) 100 MG tablet Take 1 tablet (100 mg total) by mouth daily for 14 days. 14 tablet 0   ipratropium-albuterol (DUONEB) 0.5-2.5 (3) MG/3ML SOLN Take 3 mls by nebulization 4 (four) times daily for 30 days 360 mL 1   metoprolol succinate (TOPROL-XL) 50 MG 24 hr tablet Take 1 tablet (50 mg total) by mouth daily. 90 tablet 3   nicotine (NICODERM CQ - DOSED IN MG/24 HOURS) 14 mg/24hr patch Place 1 patch onto the skin daily 28 patch 0   nitroGLYCERIN (NITROSTAT) 0.4 MG SL tablet Place 1 tablet (0.4 mg total) under the tongue every 5 (five) minutes as needed for chest pain. 50 tablet 0   potassium chloride SA (KLOR-CON M) 20 MEQ tablet Take 2 tablets (40 mEq total) by mouth daily. 180 tablet 3   prasugrel (EFFIENT) 10 MG TABS tablet Take 1 tablet (10 mg total) by mouth daily. 90 tablet 3   rosuvastatin (CRESTOR) 40 MG  tablet Take 1 tablet (40 mg total) by mouth daily. 90 tablet 3   spironolactone (ALDACTONE) 25 MG tablet Take 0.5 tablets (12.5 mg total) by mouth daily. 45 tablet 3   VITAMIN D PO Take by mouth daily at 12 noon.     Evolocumab (REPATHA SURECLICK) 140 MG/ML SOAJ Inject 140 mg into the skin every 14 (fourteen) days. (Patient not taking: Reported on 10/18/2022) 2 mL 2   No current facility-administered medications for this visit.    Allergies:   Buspirone, Citalopram, Metrizamide, Sulfa antibiotics, Contrast media [iodinated contrast media], Sulfa drugs cross reactors, and Sulfasalazine    Social History:  The patient  reports that she quit smoking about 7 months ago. Her smoking use included cigarettes. She has never used smokeless tobacco. She reports that she does not drink alcohol and does not use drugs.   Family History:  The patient's  family history includes Breast cancer in her maternal grandmother; Cancer in her father and paternal grandfather; Depression in her mother; Hypertension in her sister.    ROS:  Please see the history of present illness.   Otherwise, review of systems are positive for none.   All other systems are reviewed and negative.    PHYSICAL EXAM: VS:  BP 104/64 (BP Location: Left Arm, Patient Position: Sitting, Cuff Size: Large)   Pulse 88   Ht 5\' 1"  (1.549 m)   Wt 178 lb 8 oz (81 kg)   SpO2 97%   BMI 33.73 kg/m  , BMI Body mass index is 33.73 kg/m. GEN: Well nourished, well developed, in no acute distress  HEENT: normal  Neck: no JVD, carotid bruits, or masses Cardiac: RRR; no murmurs, rubs, or gallops,no edema  Respiratory:  clear to auscultation bilaterally, normal work of breathing GI: soft, nontender, nondistended, + BS MS: no deformity or atrophy  Skin: warm and dry, no rash Neuro:  Strength and sensation are intact Psych: euthymic mood, full affect Radial pulses normal bilaterally.   EKG:  EKG is ordered today. EKG showed normal sinus rhythm with low voltage, old inferior infarct and anterior T wave changes suggestive of ischemia.   Recent Labs: 03/13/2022: B Natriuretic Peptide 92.8; Magnesium 2.0 10/16/2022: BUN 8; Creatinine, Ser 0.85; Hemoglobin 13.4; Platelets 304; Potassium 3.0; Sodium 140    Lipid Panel    Component Value Date/Time   CHOL 201 (H) 03/14/2022 0148   CHOL 280 (H) 11/29/2011 1326   TRIG 180 (H) 03/14/2022 0148   TRIG 186 11/29/2011 1326   HDL 28 (L) 03/14/2022 0148   HDL 29 (L) 11/29/2011 1326   CHOLHDL 7.2 03/14/2022 0148   VLDL 36 03/14/2022 0148   VLDL 37 11/29/2011 1326   LDLCALC 137 (H) 03/14/2022 0148   LDLCALC 214 (H) 11/29/2011 1326      Wt Readings from Last 3 Encounters:  10/18/22 178 lb 8 oz (81 kg)  10/16/22 160 lb (72.6 kg)  09/20/22 176 lb 5.9 oz (80 kg)          10/26/2021    3:07 PM  PAD Screen  Previous PAD dx? No   Previous surgical procedure? No  Pain with walking? No  Feet/toe relief with dangling? No  Painful, non-healing ulcers? No  Extremities discolored? No      ASSESSMENT AND PLAN:  1.  Coronary artery disease involving native coronary arteries with other forms of angina: Most recent cardiac catheterization in February showed patent RCA stents with no other obstructive disease.  She reports worsening angina  but also her anxiety is not controlled which might be contributing.  Her EKG continues to show anterior T wave changes which seem to be new.  I recommend evaluation with a treadmill nuclear stress test.  Continue dual antiplatelet therapy with aspirin and prasugrel.    2.  Chronic systolic heart failure: Most recent ejection fraction was 45 %.  Continue Toprol 50 mg once daily.  She reports that Marcelline Deist was not covered by her insurance.  Her blood pressure continues to be on the low side and thus she is not on an ACE inhibitor or ARB.  Continue Toprol and spironolactone. Given persistent hypokalemia and bloating, I elected to increase spironolactone to 25 mg once daily.  3.  Paroxysmal supraventricular tachycardia: She is mostly having issues with sinus tachycardia at the present time.  Continue Toprol 50 mg once daily.    4.  Hyperlipidemia: Continue rosuvastatin 40 mg daily.  She had recent lipid profile which showed an LDL of 64.  Thus, no need for a PCSK9 inhibitor at this time.  5.  Prior tobacco use: She quit smoking after her most recent myocardial infarction with no relapse.     Disposition: Follow-up in 1 month.  Signed,  Lorine Bears, MD  10/18/2022 8:10 AM    [facility]

## 2022-10-19 ENCOUNTER — Other Ambulatory Visit: Payer: Self-pay

## 2022-10-19 ENCOUNTER — Other Ambulatory Visit (HOSPITAL_COMMUNITY): Payer: Self-pay

## 2022-10-19 NOTE — Telephone Encounter (Signed)
Spoke with the pts plan about an ''appeal update'' I was told the appeal has been expedited and a determination will be made in 24hrs  [phone] [phone]

## 2022-10-26 NOTE — Telephone Encounter (Signed)
Spoke with patient and she states that last time she saw Dr. Kirke Corin he stated that her cholesterol numbers were much better and didn't require any further medications. Advised that I would check with provider and give her a call back with that information.   10/18/22 Dr. Kirke Corin Hyperlipidemia: Continue rosuvastatin 40 mg daily.  She had recent lipid profile which showed an LDL of 64.  Thus, no need for a PCSK9 inhibitor at this time.

## 2022-10-26 NOTE — Telephone Encounter (Signed)
My apologies.  For some reason, my Epic chart review stopped at my last office visit and I did not realize she had just seen Dr. Kirke Corin. No need for additional therapy.

## 2022-10-26 NOTE — Telephone Encounter (Signed)
Spoke with patient and reviewed provider reply. She verbalized understanding with no further questions at this time.

## 2022-10-26 NOTE — Telephone Encounter (Signed)
Received word from our pharmacy team that the patient's insurance denied initiation of PCSK9 inhibitor, including appeal despite the patient's history of CAD with inferior ST elevation MI status post PCI/DES to the RCA in 2023 status post mechanical thrombectomy and DES placement in 03/2022 for late stent thrombosis.  She has been maintained on maximally tolerated rosuvastatin 40 mg and continues to have an LDL above goal at 64 most recently.  Recommendations: -Start Leqvio 284 mg every 3 months x 2 doses followed by 1 dose every 6 months thereafter -Continue rosuvastatin 40 mg daily -Follow-up fasting lipid panel after second dose of Leqvio

## 2022-11-02 ENCOUNTER — Other Ambulatory Visit: Payer: Self-pay

## 2022-11-03 ENCOUNTER — Ambulatory Visit: Payer: 59

## 2022-11-03 ENCOUNTER — Other Ambulatory Visit: Payer: Self-pay

## 2022-11-03 DIAGNOSIS — E559 Vitamin D deficiency, unspecified: Secondary | ICD-10-CM | POA: Diagnosis not present

## 2022-11-03 DIAGNOSIS — K625 Hemorrhage of anus and rectum: Secondary | ICD-10-CM | POA: Diagnosis not present

## 2022-11-03 DIAGNOSIS — J019 Acute sinusitis, unspecified: Secondary | ICD-10-CM | POA: Diagnosis not present

## 2022-11-03 DIAGNOSIS — R059 Cough, unspecified: Secondary | ICD-10-CM | POA: Diagnosis not present

## 2022-11-03 DIAGNOSIS — E7800 Pure hypercholesterolemia, unspecified: Secondary | ICD-10-CM | POA: Diagnosis not present

## 2022-11-03 DIAGNOSIS — R058 Other specified cough: Secondary | ICD-10-CM | POA: Diagnosis not present

## 2022-11-03 DIAGNOSIS — E538 Deficiency of other specified B group vitamins: Secondary | ICD-10-CM | POA: Diagnosis not present

## 2022-11-03 DIAGNOSIS — E785 Hyperlipidemia, unspecified: Secondary | ICD-10-CM | POA: Diagnosis not present

## 2022-11-03 DIAGNOSIS — J4540 Moderate persistent asthma, uncomplicated: Secondary | ICD-10-CM | POA: Diagnosis not present

## 2022-11-03 DIAGNOSIS — R202 Paresthesia of skin: Secondary | ICD-10-CM | POA: Diagnosis not present

## 2022-11-03 DIAGNOSIS — I2510 Atherosclerotic heart disease of native coronary artery without angina pectoris: Secondary | ICD-10-CM | POA: Diagnosis not present

## 2022-11-03 DIAGNOSIS — E876 Hypokalemia: Secondary | ICD-10-CM | POA: Diagnosis not present

## 2022-11-03 DIAGNOSIS — R5383 Other fatigue: Secondary | ICD-10-CM | POA: Diagnosis not present

## 2022-11-03 DIAGNOSIS — M797 Fibromyalgia: Secondary | ICD-10-CM | POA: Diagnosis not present

## 2022-11-03 MED ORDER — AMOXICILLIN 875 MG PO TABS
875.0000 mg | ORAL_TABLET | Freq: Two times a day (BID) | ORAL | 0 refills | Status: DC
  Filled 2022-11-03: qty 14, 7d supply, fill #0

## 2022-11-03 MED ORDER — PREDNISONE 10 MG PO TABS
ORAL_TABLET | ORAL | 0 refills | Status: AC
  Filled 2022-11-03: qty 12, 8d supply, fill #0

## 2022-11-03 NOTE — Telephone Encounter (Signed)
I spoke with the patients plan and was told the appeal was denied and the letter was/will only be sent to the  member   Phone# [phone] [phone], case [phone]

## 2022-11-06 ENCOUNTER — Other Ambulatory Visit: Payer: Self-pay

## 2022-11-06 MED ORDER — CYANOCOBALAMIN 1000 MCG PO TABS
1000.0000 ug | ORAL_TABLET | Freq: Every day | ORAL | 1 refills | Status: AC
  Filled 2022-11-06: qty 90, 90d supply, fill #0

## 2022-11-10 ENCOUNTER — Other Ambulatory Visit: Payer: Self-pay

## 2022-11-10 DIAGNOSIS — J45901 Unspecified asthma with (acute) exacerbation: Secondary | ICD-10-CM | POA: Diagnosis not present

## 2022-11-10 DIAGNOSIS — I2510 Atherosclerotic heart disease of native coronary artery without angina pectoris: Secondary | ICD-10-CM | POA: Diagnosis not present

## 2022-11-10 DIAGNOSIS — E538 Deficiency of other specified B group vitamins: Secondary | ICD-10-CM | POA: Diagnosis not present

## 2022-11-10 DIAGNOSIS — J40 Bronchitis, not specified as acute or chronic: Secondary | ICD-10-CM | POA: Diagnosis not present

## 2022-11-10 MED ORDER — PREDNISONE 10 MG PO TABS
ORAL_TABLET | ORAL | 0 refills | Status: AC
  Filled 2022-11-10: qty 20, 8d supply, fill #0

## 2022-11-10 MED ORDER — LEVOFLOXACIN 500 MG PO TABS
500.0000 mg | ORAL_TABLET | Freq: Every day | ORAL | 0 refills | Status: DC
  Filled 2022-11-10: qty 7, 7d supply, fill #0

## 2022-11-13 ENCOUNTER — Other Ambulatory Visit: Payer: Self-pay

## 2022-11-13 ENCOUNTER — Other Ambulatory Visit: Payer: Self-pay | Admitting: Cardiovascular Disease

## 2022-11-13 MED FILL — Metoprolol Succinate Tab ER 24HR 50 MG (Tartrate Equiv): ORAL | 30 days supply | Qty: 30 | Fill #0 | Status: AC

## 2022-11-14 DIAGNOSIS — R079 Chest pain, unspecified: Secondary | ICD-10-CM | POA: Diagnosis not present

## 2022-11-14 DIAGNOSIS — R0602 Shortness of breath: Secondary | ICD-10-CM | POA: Diagnosis not present

## 2022-11-16 ENCOUNTER — Other Ambulatory Visit: Payer: Self-pay

## 2022-11-17 ENCOUNTER — Other Ambulatory Visit: Payer: Self-pay

## 2022-11-17 ENCOUNTER — Encounter: Admission: RE | Admit: 2022-11-17 | Payer: 59 | Source: Ambulatory Visit

## 2022-11-21 ENCOUNTER — Encounter: Payer: 59 | Attending: Cardiovascular Disease

## 2022-11-28 ENCOUNTER — Ambulatory Visit: Payer: 59 | Attending: Cardiovascular Disease | Admitting: Cardiovascular Disease

## 2022-11-29 ENCOUNTER — Encounter: Payer: Self-pay | Admitting: Cardiovascular Disease

## 2022-11-29 ENCOUNTER — Other Ambulatory Visit: Payer: Self-pay

## 2022-11-29 MED ORDER — CEFUROXIME AXETIL 250 MG PO TABS
ORAL_TABLET | ORAL | 0 refills | Status: DC
  Filled 2022-11-29: qty 10, 5d supply, fill #0

## 2022-12-07 ENCOUNTER — Encounter: Payer: 59 | Attending: Cardiovascular Disease

## 2022-12-14 ENCOUNTER — Other Ambulatory Visit: Payer: Self-pay

## 2022-12-18 ENCOUNTER — Other Ambulatory Visit: Payer: Self-pay

## 2022-12-18 ENCOUNTER — Other Ambulatory Visit: Payer: Self-pay | Admitting: Cardiovascular Disease

## 2022-12-18 MED ORDER — METOPROLOL SUCCINATE ER 50 MG PO TB24
50.0000 mg | ORAL_TABLET | Freq: Every day | ORAL | 0 refills | Status: DC
  Filled 2022-12-18: qty 30, 30d supply, fill #0

## 2022-12-19 ENCOUNTER — Other Ambulatory Visit: Payer: Self-pay

## 2022-12-19 MED ORDER — ALBUTEROL SULFATE HFA 108 (90 BASE) MCG/ACT IN AERS
2.0000 | INHALATION_SPRAY | Freq: Four times a day (QID) | RESPIRATORY_TRACT | 5 refills | Status: DC | PRN
  Filled 2022-12-19 – 2023-02-19 (×2): qty 18, 25d supply, fill #0
  Filled 2023-03-20: qty 18, 25d supply, fill #1
  Filled 2023-04-30: qty 18, 25d supply, fill #2
  Filled 2023-05-21: qty 18, 25d supply, fill #3
  Filled 2023-06-18: qty 18, 25d supply, fill #4
  Filled 2023-07-20: qty 18, 25d supply, fill #5

## 2022-12-21 ENCOUNTER — Other Ambulatory Visit: Payer: Self-pay

## 2022-12-21 DIAGNOSIS — R0981 Nasal congestion: Secondary | ICD-10-CM | POA: Diagnosis not present

## 2022-12-21 DIAGNOSIS — J40 Bronchitis, not specified as acute or chronic: Secondary | ICD-10-CM | POA: Diagnosis not present

## 2022-12-21 DIAGNOSIS — Z20818 Contact with and (suspected) exposure to other bacterial communicable diseases: Secondary | ICD-10-CM | POA: Diagnosis not present

## 2022-12-21 MED ORDER — AMOXICILLIN-POT CLAVULANATE 875-125 MG PO TABS
ORAL_TABLET | ORAL | 0 refills | Status: DC
  Filled 2022-12-21: qty 14, 7d supply, fill #0

## 2022-12-21 MED ORDER — PREDNISONE 10 MG PO TABS
ORAL_TABLET | ORAL | 0 refills | Status: DC
  Filled 2022-12-21: qty 20, 8d supply, fill #0

## 2023-01-03 ENCOUNTER — Other Ambulatory Visit: Payer: Self-pay

## 2023-01-11 ENCOUNTER — Other Ambulatory Visit: Payer: Self-pay

## 2023-01-11 MED ORDER — ALPRAZOLAM 0.5 MG PO TABS
0.5000 mg | ORAL_TABLET | Freq: Three times a day (TID) | ORAL | 2 refills | Status: AC | PRN
  Filled 2023-01-19: qty 90, 30d supply, fill #0
  Filled 2023-02-19: qty 90, 30d supply, fill #1
  Filled 2023-03-20: qty 90, 30d supply, fill #2

## 2023-01-15 ENCOUNTER — Other Ambulatory Visit: Payer: Self-pay

## 2023-01-15 DIAGNOSIS — H01004 Unspecified blepharitis left upper eyelid: Secondary | ICD-10-CM | POA: Diagnosis not present

## 2023-01-15 MED ORDER — AMOXICILLIN-POT CLAVULANATE 875-125 MG PO TABS
1.0000 | ORAL_TABLET | Freq: Two times a day (BID) | ORAL | 0 refills | Status: DC
  Filled 2023-01-15: qty 14, 7d supply, fill #0

## 2023-01-18 ENCOUNTER — Other Ambulatory Visit: Payer: Self-pay

## 2023-01-18 ENCOUNTER — Other Ambulatory Visit: Payer: Self-pay | Admitting: Cardiovascular Disease

## 2023-01-18 DIAGNOSIS — L03213 Periorbital cellulitis: Secondary | ICD-10-CM | POA: Diagnosis not present

## 2023-01-18 MED ORDER — OFLOXACIN 0.3 % OP SOLN
1.0000 [drp] | Freq: Four times a day (QID) | OPHTHALMIC | 0 refills | Status: AC
  Filled 2023-01-18: qty 5, 10d supply, fill #0

## 2023-01-18 MED ORDER — AZITHROMYCIN 250 MG PO TABS
ORAL_TABLET | ORAL | 0 refills | Status: AC
  Filled 2023-01-18: qty 6, 5d supply, fill #0

## 2023-01-19 ENCOUNTER — Other Ambulatory Visit: Payer: Self-pay

## 2023-01-19 ENCOUNTER — Telehealth: Payer: Self-pay | Admitting: Cardiovascular Disease

## 2023-01-19 NOTE — Telephone Encounter (Signed)
*  STAT* If patient is at the pharmacy, call can be transferred to refill team.   1. Which medications need to be refilled? (please list name of each medication and dose if known) metoprolol succinate (TOPROL-XL) 50 MG 24 hr tablet    2. Would you like to learn more about the convenience, safety, & potential cost savings by using the [facility]?no    3. Are you open to using the [facility]  ).   4. Which pharmacy/location (including street and city if local pharmacy) is medication to be sent to? [place] [facility]     5. Do they need a 30 day or 90 day supply? 90   Pt states she is completely out

## 2023-01-19 NOTE — Telephone Encounter (Signed)
Please contact pt for future appointment. Pt overdue for 1 month f/u. Pt needing refills.

## 2023-01-19 NOTE — Telephone Encounter (Signed)
Awaiting patient to call and schedule past due follow up appt.   Kendrick Fries, CMA  to Lisa Davila     01/19/23  8:12 AM Note Please contact pt for future appointment. Pt overdue for 1 month f/u. Pt needing refills.

## 2023-01-22 ENCOUNTER — Other Ambulatory Visit: Payer: Self-pay

## 2023-01-22 MED FILL — Metoprolol Succinate Tab ER 24HR 50 MG (Tartrate Equiv): ORAL | 30 days supply | Qty: 30 | Fill #0 | Status: AC

## 2023-01-23 ENCOUNTER — Other Ambulatory Visit: Payer: Self-pay

## 2023-01-24 ENCOUNTER — Encounter: Payer: Self-pay | Admitting: Cardiovascular Disease

## 2023-02-13 ENCOUNTER — Ambulatory Visit: Payer: 59 | Attending: Medical | Admitting: Cardiology

## 2023-02-13 ENCOUNTER — Encounter: Payer: Self-pay | Admitting: Cardiology

## 2023-02-13 ENCOUNTER — Other Ambulatory Visit: Payer: Self-pay

## 2023-02-13 VITALS — BP 110/72 | HR 80 | Ht 61.0 in | Wt 181.2 lb

## 2023-02-13 DIAGNOSIS — I471 Supraventricular tachycardia, unspecified: Secondary | ICD-10-CM

## 2023-02-13 DIAGNOSIS — Z720 Tobacco use: Secondary | ICD-10-CM

## 2023-02-13 DIAGNOSIS — R600 Localized edema: Secondary | ICD-10-CM

## 2023-02-13 DIAGNOSIS — I25118 Atherosclerotic heart disease of native coronary artery with other forms of angina pectoris: Secondary | ICD-10-CM | POA: Diagnosis not present

## 2023-02-13 DIAGNOSIS — I5022 Chronic systolic (congestive) heart failure: Secondary | ICD-10-CM | POA: Diagnosis not present

## 2023-02-13 DIAGNOSIS — I255 Ischemic cardiomyopathy: Secondary | ICD-10-CM

## 2023-02-13 DIAGNOSIS — R072 Precordial pain: Secondary | ICD-10-CM | POA: Diagnosis not present

## 2023-02-13 DIAGNOSIS — R0602 Shortness of breath: Secondary | ICD-10-CM

## 2023-02-13 DIAGNOSIS — E785 Hyperlipidemia, unspecified: Secondary | ICD-10-CM | POA: Diagnosis not present

## 2023-02-13 MED ORDER — NICOTINE 14 MG/24HR TD PT24
14.0000 mg | MEDICATED_PATCH | Freq: Every day | TRANSDERMAL | 0 refills | Status: AC
  Filled 2023-02-13: qty 28, 28d supply, fill #0

## 2023-02-13 MED ORDER — METOPROLOL SUCCINATE ER 50 MG PO TB24
50.0000 mg | ORAL_TABLET | Freq: Every day | ORAL | 3 refills | Status: DC
  Filled 2023-02-13: qty 30, 30d supply, fill #0
  Filled 2023-03-20: qty 30, 30d supply, fill #1
  Filled 2023-04-20: qty 30, 30d supply, fill #2
  Filled 2023-05-21: qty 30, 30d supply, fill #3
  Filled 2023-06-18: qty 30, 30d supply, fill #4
  Filled 2023-07-20: qty 30, 30d supply, fill #5
  Filled 2023-08-24 (×2): qty 30, 30d supply, fill #6

## 2023-02-13 MED ORDER — PRASUGREL HCL 10 MG PO TABS
10.0000 mg | ORAL_TABLET | Freq: Every day | ORAL | 3 refills | Status: DC
  Filled 2023-02-13: qty 30, 30d supply, fill #0
  Filled 2023-03-20: qty 30, 30d supply, fill #1
  Filled 2023-04-20: qty 30, 30d supply, fill #2
  Filled 2023-05-21: qty 30, 30d supply, fill #3
  Filled 2023-06-18: qty 30, 30d supply, fill #4
  Filled 2023-07-20 (×2): qty 30, 30d supply, fill #5

## 2023-02-13 MED ORDER — ROSUVASTATIN CALCIUM 40 MG PO TABS
40.0000 mg | ORAL_TABLET | Freq: Every day | ORAL | 3 refills | Status: DC
  Filled 2023-02-13: qty 30, 30d supply, fill #0
  Filled 2023-03-20: qty 30, 30d supply, fill #1
  Filled 2023-04-20: qty 30, 30d supply, fill #2
  Filled 2023-05-21: qty 30, 30d supply, fill #3
  Filled 2023-06-18: qty 30, 30d supply, fill #4
  Filled 2023-07-20: qty 30, 30d supply, fill #5
  Filled 2023-08-24: qty 30, 30d supply, fill #6

## 2023-02-13 NOTE — Patient Instructions (Signed)
Medication Instructions:  Your physician recommends that you continue on your current medications as directed. Please refer to the Current Medication list given to you today.  *If you need a refill on your cardiac medications before your next appointment, please call your pharmacy*  Lab Work: -None ordered  Testing/Procedures: Your physician has requested that you have an echocardiogram. Echocardiography is a painless test that uses sound waves to create images of your heart. It provides your doctor with information about the size and shape of your heart and how well your heart's chambers and valves are working. This procedure takes approximately one hour. There are no restrictions for this procedure. Please do NOT wear cologne, perfume, aftershave, or lotions (deodorant is allowed). Please arrive 15 minutes prior to your appointment time.   Follow-Up: At [facility], you and your health needs are our priority.  As part of our continuing mission to provide you with exceptional heart care, we have created designated Provider Care Teams.  These Care Teams include your primary Cardiologist (physician) and Advanced Practice Providers (APPs -  Physician Assistants and Nurse Practitioners) who all work together to provide you with the care you need, when you need it.  Your next appointment:   After echocardiogram   Provider:   Charlsie Quest, NP    Other Instructions -None

## 2023-02-13 NOTE — Progress Notes (Signed)
Cardiology Office Note:  .   Date:  02/13/2023  ID:  Lisa Davila, DOB 29-Jan-1976, MRN [id number] PCP: Barbette Reichmann, MD  [facility] Providers Cardiologist:  Lorine Bears, MD    History of Present Illness: .   Lisa Davila is a 47 y.o. female with a past medical history of coronary disease status post inferior ST elevated myocardial infarction status post PCI/DES to the RCA in 06/2021 status post mechanical thrombectomy and DES placement in 03/2022 for late stent thrombosis, HFrEF, PSVT status post prior EP study with no inducible arrhythmia, fibromyalgia, hypertension, hyperlipidemia, tobacco abuse, anxiety, hypokalemia, who presents today for follow-up.  She originally presented in 06/2021 with inferior ST elevated myocardial infarction with emergent left heart cath revealing thrombotic subtotal occlusion of the proximal RCA.  This was treated successfully with PCI/DES.  There was mild nonobstructive disease affecting the left coronary arteries.  Echo at that time revealed an EF of 50-55%.  The Brilinta was transitioned to clopidogrel secondary to persistent dyspnea.  Following the diagnosis of COVID, she had recurrent chest pain multiple emergency room visits following her MI.  Repeat echo in 08/2021 showed EF of 35%.  Due to her drop in EF and symptoms, MR/LHC was performed in 11/2021 which showed patent RCA stent and mild in-stent restenosis with no evidence of obstructive disease affecting the left coronary arteries.  EF was 35%.  RHC showed mildly to moderately elevated filling pressures, minimal pulmonary hypertension, normal cardiac output.  She presented to the hospital on 03/2022 with a recurrent MI.  Left heart catheterization 03/2022 showed severe single-vessel CAD with occlusion of the proximal/mid RCA with late stent thrombosis and heavy thrombus burden.  Nonobstructive CAD was observed in the left coronary artery similar to catheter 11/2021.  She underwent successful  mechanical thrombectomy and DES placement to the proximal/mid RCA.  Echo in 03/2022 showed an EF of 35-40%, hypokinesis of the left ventricular lateral wall and inferior lateral wall, mild LVH, G1 DD trivial mitral regurgitation.  She was seen 06/2018 for having been recently diagnosed with COVID and reported chest tightness similar to her prior angina.  Given that she underwent left heart catheterization in 06/26/2022 that showed widely patent RCA stents with no significant restenosis with otherwise nonobstructive disease as outlined.  LVEF 45-50% by visual estimate.  Medical therapy was recommended along with titration of metoprolol XL 20 mg daily for improvement in sinus tachycardia.  She presented to the emergency department in 07/2022 in 08/2022 with intermittent chest discomfort with reassuring workup including multiple negative high-sensitivity troponins.  She was last seen in clinic 10/18/2022 continues to struggle with anxiety reports worsening substernal chest pain and tightness whichat rest and with exertion.  She had had several recent emergency room visits with normal troponins.  She also complained of abdominal bloating and weight gain but no lower extremity edema.  At that time she was scheduled for [facility] but unfortunately no showed on 3 separate occasions and the test was ultimately canceled.  She returns to clinic today with complaints of continued chest tightness with this chest pressure that is substernal that happens with rest or exertion but is not required the use of any Nitrostat.  She has called the life-saving heart on several occasions for reassurance.  She is unsure if it is anxiety or an actual issue with her heart.  She also endorses shortness of breath, abdominal bloating, and peripheral edema which is new for her.  Unfortunately she has also started  smoking again.  She states that she has been compliant with her current medications.  ROS: 10 point review of systems has  been reviewed and considered negative with exception of what is been listed in the HPI  Studies Reviewed: Marland Kitchen   EKG Interpretation Date/Time:  Tuesday February 13 2023 15:52:23 EDT Ventricular Rate:  80 PR Interval:  172 QRS Duration:  82 QT Interval:  408 QTC Calculation: 470 R Axis:   -10  Text Interpretation: Normal sinus rhythm Inferior infarct (cited on or before 02-Jun-2022) When compared with ECG of 16-Oct-2022 22:12, Borderline criteria for Anterior infarct are no longer Present Borderline criteria for Anterolateral infarct are no longer Present Confirmed by Charlsie Quest ([id number]) on 02/13/2023 4:03:58 PM    LHC 06/26/2022:   Mid LAD lesion is 30% stenosed.   2nd Diag lesion is 50% stenosed.   Mid RCA lesion is 30% stenosed.   Non-stenotic Prox RCA to Mid RCA lesion was previously treated.   There is mild left ventricular systolic dysfunction.   LV end diastolic pressure is normal.   The left ventricular ejection fraction is 45-50% by visual estimate.   1.  Widely patent RCA stents with no significant restenosis. 2.  Mildly reduced LV systolic function with normal LVEDP.   Recommendations: Continue aggressive medical therapy. Increase Toprol to 100 mg once daily for better control of sinus tachycardia. __________   [facility] 03/14/2022: Conclusions: Severe single vessel CAD with occlusion of proximal/mid RCA with late stent thrombosis and heavy thrombus burden. Non-obstructive coronary artery disease observed in the left coronary artery, similar to prior cath from 11/2021. Mildly elevated left ventricular filling pressure (LVEDP 20 mmHg). Successful mechanical thrombectomy and drug-eluting stent placement to proximal/mid RCA using Onyx Frontier 3.0 x 26 mm drug-eluting stent (postdilated up to 4.0 mm) with 0% residual stenosis and TIMI-3 flow.   Recommendations: Tirofiban infusion x 4 hours. Continue dual antiplatelet therapy with aspirin and prasugrel for at least 12 months.   Check CYP 2C19 genotype to assess clopidogrel response. Follow-up echocardiogram. Aggressive secondary prevention of coronary artery disease and escalation of evidence-based heart failure therapy as blood pressure allows. __________   2D echo 03/14/2022: 1. Left ventricular ejection fraction, by estimation, is 35 to 40%. The  left ventricle has moderately decreased function. The left ventricle  demonstrates regional wall motion abnormalities (see scoring  diagram/findings for description). There is mild  left ventricular hypertrophy. Left ventricular diastolic parameters are  consistent with Grade I diastolic dysfunction (impaired relaxation). There  is severe hypokinesis of the left ventricular, entire lateral wall and  inferolateral wall.   2. Right ventricular systolic function is mildly reduced. The right  ventricular size is normal.   3. The mitral valve is normal in structure. Trivial mitral valve  regurgitation.   4. The aortic valve was not well visualized. Aortic valve regurgitation  is not visualized. No aortic stenosis is present.  __________   [facility] patch 12/2021: Patient had a min HR of 59 bpm, max HR of 123 bpm, and avg HR of 89 bpm. Predominant underlying rhythm was Sinus Rhythm.  Rare PACs and rare PVCs. No significant arrhythmia overall. __________   [facility] 11/07/2021:   Mid LAD lesion is 30% stenosed.   Prox RCA lesion is 20% stenosed.   2nd Diag lesion is 50% stenosed.   There is moderate left ventricular systolic dysfunction.   LV end diastolic pressure is mildly elevated.   1.  Patent proximal RCA stent with mild in-stent restenosis.  No obstructive disease affecting the left coronary artery system. 2.  Moderately reduced LV systolic function with an EF of 35% with global hypokinesis that is more prominent in the inferior apical area. 3.  Right heart catheterization showed mildly to moderately elevated filling pressures, minimal pulmonary hypertension and normal  cardiac output.   Recommendations: Continue medical therapy for coronary artery disease. Optimize heart failure treatment.  The patient is volume overloaded.  I increased Lasix to 40 mg once daily.  In addition, due to resting sinus tachycardia, increased Toprol to 50 mg once daily. We will plan on adding an ARB and SGLT2 inhibitor upon follow-up. __________   2D echo 07/04/2021: 1. Left ventricular ejection fraction, by estimation, is 50 to 55%. The  left ventricle has low normal function. The left ventricle has no regional  wall motion abnormalities. There is mild left ventricular hypertrophy.  Left ventricular diastolic  parameters are indeterminate.   2. Right ventricular systolic function is normal. The right ventricular  size is normal.   3. The mitral valve is normal in structure. Trivial mitral valve  regurgitation. No evidence of mitral stenosis.   4. The aortic valve is normal in structure. Aortic valve regurgitation is  not visualized. No aortic stenosis is present.   5. The inferior vena cava is normal in size with greater than 50%  respiratory variability, suggesting right atrial pressure of 3 mmHg.  __________   LHC 07/04/2021:   Prox RCA lesion is 99% stenosed.   Mid LAD lesion is 30% stenosed.   A drug-eluting stent was successfully placed using a STENT ONYX FRONTIER 4.0X22.   Post intervention, there is a 0% residual stenosis.   There is mild left ventricular systolic dysfunction.   LV end diastolic pressure is mildly elevated.   The left ventricular ejection fraction is 45-50% by visual estimate.   1.  Severe one-vessel coronary artery disease with thrombotic subtotal occlusion of the proximal right coronary artery which is the culprit for inferior ST elevation myocardial infarction.  Mild disease affecting the left coronary system. 2.  Mildly reduced LV systolic function and mildly elevated left ventricular end-diastolic pressure at 17 mmHg. 3.  Successful  angioplasty and drug-eluting stent placement to the right coronary artery.   Recommendations: Given large thrombus burden, continue Aggrastat infusion for 12 hours. Dual antiplatelet therapy is recommended for at least 12 months. Aggressive treatment of risk factors and smoking cessation.   Risk Assessment/Calculations:             Physical Exam:   VS:  BP 110/72 (BP Location: Left Arm, Patient Position: Sitting, Cuff Size: Normal)   Pulse 80   Ht 5\' 1"  (1.549 m)   Wt 181 lb 3.2 oz (82.2 kg)   SpO2 100%   BMI 34.24 kg/m    Wt Readings from Last 3 Encounters:  02/13/23 181 lb 3.2 oz (82.2 kg)  10/18/22 178 lb 8 oz (81 kg)  10/16/22 160 lb (72.6 kg)    GEN: Well nourished, well developed in no acute distress NECK: No JVD; No carotid bruits CARDIAC: RRR, no murmurs, rubs, gallops RESPIRATORY:  Clear to auscultation without rales, wheezing or rhonchi  ABDOMEN: Soft, non-tender, non-distended EXTREMITIES: Trace pretibial edema; No deformity   ASSESSMENT AND PLAN: .   Coronary artery disease involving native coronary arteries with stable angina.  She continues to report intermittent randomly recurrent substernal chest tightness without radiation that is short-lived.  She states is not the same chest discomfort that she  had from previous PCI and heart attacks.  Last left heart catheterization was 2/24 demonstrated patent RCA stent otherwise nonobstructive disease involving the left coronary tree.  EKG today reveals sinus rhythm with a rate of 80 no acute changes noted today.  She is continued on aspirin 81 mg daily, Effient 10 mg daily and rosuvastatin 40 mg daily.  She had previously been on Repatha but is no longer on this medication due to cost.  HFrEF with last LVEF of visualized 45-50% milligram revealed 35 to 40% in 03/2022.  Previously she had been well compensated.  Unfortunately she complains of abdominal bloating, shortness of breath, and now peripheral edema which is new for  her.  She has been scheduled for an updated echocardiogram.  She is continued on spironolactone 25 mg daily and Toprol-XL 50 mg daily which she requires refills today.  Will continue to escalate GDMT as tolerated.  Previously had been on [place] but stated it was not covered by insurance.  She is not currently on ACE or ARB due to soft blood pressures.  Hyperlipidemia with an LDL of 64 in 10/2022 previous LP(a) 81.7.  She had previously been continued on rosuvastatin 40 mg daily but is no longer on Repatha due to cost constraints.  PSVT which is quiescent on Toprol-XL.  Tobacco which she is requesting nicotine patches today..  Total cessation is recommended.       Dispo: Patient to return to clinic to see MD/APP once testing is completed or sooner if needed for reevaluation of symptoms  Signed, Jakylah Bassinger, NP  10 mg daily

## 2023-02-14 ENCOUNTER — Other Ambulatory Visit: Payer: Self-pay

## 2023-02-14 ENCOUNTER — Telehealth: Payer: Self-pay | Admitting: Cardiovascular Disease

## 2023-02-14 NOTE — Telephone Encounter (Signed)
Called patient, advised of message from NP.  °Patient verbalized understanding.  ° °

## 2023-02-14 NOTE — Telephone Encounter (Signed)
Called patient, she states that she has had a 6 lb weight gain in about 24 hours. She states her legs are swelling and painful, she is not short of breath, she does elevate the legs but no changes to the legs occurs, she denied any diet changes- she was seen in office yesterday by Lavonna Rua, NP- I advised with patient I would check with her and see if she has any recommendations.   Patient verbalized understanding.

## 2023-02-14 NOTE — Telephone Encounter (Signed)
Pt c/o swelling: STAT is pt has developed SOB within 24 hours  If swelling, where is the swelling located? Both legs   How much weight have you gained and in what time span? 6 pounds in 24 hrs   Have you gained 3 pounds in a day or 5 pounds in a week? Yes   Do you have a log of your daily weights (if so, list)? Yes, 190, 181,187  Are you currently taking a fluid pill? No   Are you currently SOB? No   Have you traveled recently? No

## 2023-02-19 ENCOUNTER — Other Ambulatory Visit: Payer: Self-pay

## 2023-02-27 ENCOUNTER — Ambulatory Visit: Payer: 59 | Admitting: Medical

## 2023-03-01 ENCOUNTER — Ambulatory Visit: Payer: 59 | Attending: Cardiology

## 2023-03-01 ENCOUNTER — Telehealth: Payer: Self-pay | Admitting: Cardiology

## 2023-03-01 ENCOUNTER — Other Ambulatory Visit: Payer: Self-pay | Admitting: Emergency Medicine

## 2023-03-01 DIAGNOSIS — I5022 Chronic systolic (congestive) heart failure: Secondary | ICD-10-CM

## 2023-03-01 NOTE — Telephone Encounter (Signed)
New Message:      Patient missed her Echo today. She wants to have an order to have it at the hospital asap please. She did not want to wait until our next available time.

## 2023-03-01 NOTE — Telephone Encounter (Signed)
Order has been placed for Echo at [facility] per patient request.

## 2023-03-12 ENCOUNTER — Ambulatory Visit: Payer: 59 | Admitting: Cardiology

## 2023-03-13 ENCOUNTER — Other Ambulatory Visit: Payer: Self-pay

## 2023-03-13 MED ORDER — POTASSIUM CHLORIDE CRYS ER 10 MEQ PO TBCR
40.0000 meq | EXTENDED_RELEASE_TABLET | Freq: Every day | ORAL | 3 refills | Status: DC
  Filled 2023-03-13: qty 60, 30d supply, fill #0
  Filled 2023-03-16: qty 120, 30d supply, fill #0

## 2023-03-13 NOTE — Telephone Encounter (Signed)
It is reasonable to change to taking the 10 mEq tablets versus the 20 mEq to equal the ordered dose of 40 mEq. The 10 mEq are smaller in size she will just have to take more pills at a time to equal her prescribed dose.

## 2023-03-13 NOTE — Addendum Note (Signed)
Addended by: Parke Poisson on: 03/13/2023 03:26 PM   Modules accepted: Orders

## 2023-03-16 ENCOUNTER — Other Ambulatory Visit: Payer: Self-pay

## 2023-03-16 ENCOUNTER — Ambulatory Visit: Payer: 59 | Attending: Cardiology

## 2023-03-16 DIAGNOSIS — I471 Supraventricular tachycardia, unspecified: Secondary | ICD-10-CM

## 2023-03-16 DIAGNOSIS — R072 Precordial pain: Secondary | ICD-10-CM | POA: Diagnosis not present

## 2023-03-16 LAB — ECHOCARDIOGRAM COMPLETE
AR max vel: 3.5 cm2
AV Area VTI: 3.46 cm2
AV Area mean vel: 3.17 cm2
AV Mean grad: 3 mm[Hg]
AV Peak grad: 5.3 mm[Hg]
Ao pk vel: 1.15 m/s
Area-P 1/2: 4.15 cm2
S' Lateral: 3.6 cm

## 2023-03-16 MED ORDER — PERFLUTREN LIPID MICROSPHERE
1.0000 mL | INTRAVENOUS | Status: AC | PRN
Start: 2023-03-16 — End: 2023-03-16
  Administered 2023-03-16: 2 mL via INTRAVENOUS

## 2023-03-17 NOTE — Progress Notes (Signed)
Heart squeeze is 40-45%, mild to moderately decreased function is noted.  This is some muscle stiffness related to likely blood pressure.  There were no valvular abnormalities that were noted.Study was largely unchanged from prior study.

## 2023-03-19 ENCOUNTER — Telehealth: Payer: Self-pay | Admitting: Cardiovascular Disease

## 2023-03-19 ENCOUNTER — Other Ambulatory Visit: Payer: Self-pay

## 2023-03-19 ENCOUNTER — Ambulatory Visit: Payer: 59

## 2023-03-19 ENCOUNTER — Ambulatory Visit
Admission: EM | Admit: 2023-03-19 | Discharge: 2023-03-19 | Disposition: A | Discharge status: Home / Self Care | Payer: 59 | Attending: Emergency Medicine | Admitting: Emergency Medicine

## 2023-03-19 DIAGNOSIS — E785 Hyperlipidemia, unspecified: Secondary | ICD-10-CM | POA: Insufficient documentation

## 2023-03-19 DIAGNOSIS — Z1152 Encounter for screening for COVID-19: Secondary | ICD-10-CM | POA: Diagnosis not present

## 2023-03-19 DIAGNOSIS — R509 Fever, unspecified: Secondary | ICD-10-CM | POA: Insufficient documentation

## 2023-03-19 DIAGNOSIS — I5022 Chronic systolic (congestive) heart failure: Secondary | ICD-10-CM | POA: Diagnosis not present

## 2023-03-19 DIAGNOSIS — R059 Cough, unspecified: Secondary | ICD-10-CM | POA: Diagnosis not present

## 2023-03-19 DIAGNOSIS — I509 Heart failure, unspecified: Secondary | ICD-10-CM | POA: Diagnosis not present

## 2023-03-19 DIAGNOSIS — F419 Anxiety disorder, unspecified: Secondary | ICD-10-CM | POA: Insufficient documentation

## 2023-03-19 DIAGNOSIS — Z955 Presence of coronary angioplasty implant and graft: Secondary | ICD-10-CM | POA: Diagnosis not present

## 2023-03-19 DIAGNOSIS — I110 Hypertensive heart disease with heart failure: Secondary | ICD-10-CM | POA: Insufficient documentation

## 2023-03-19 DIAGNOSIS — I2510 Atherosclerotic heart disease of native coronary artery without angina pectoris: Secondary | ICD-10-CM | POA: Insufficient documentation

## 2023-03-19 DIAGNOSIS — E876 Hypokalemia: Secondary | ICD-10-CM | POA: Diagnosis not present

## 2023-03-19 DIAGNOSIS — R0789 Other chest pain: Secondary | ICD-10-CM | POA: Diagnosis not present

## 2023-03-19 DIAGNOSIS — R918 Other nonspecific abnormal finding of lung field: Secondary | ICD-10-CM | POA: Diagnosis not present

## 2023-03-19 LAB — GROUP A STREP BY PCR: Group A Strep by PCR: NOT DETECTED

## 2023-03-19 LAB — RESP PANEL BY RT-PCR (FLU A&B, COVID) ARPGX2
Influenza A by PCR: NEGATIVE
Influenza B by PCR: NEGATIVE
SARS Coronavirus 2 by RT PCR: NEGATIVE

## 2023-03-19 LAB — BASIC METABOLIC PANEL
Anion gap: 9 (ref 5–15)
BUN: 5 mg/dL — ABNORMAL LOW (ref 6–20)
CO2: 25 mmol/L (ref 22–32)
Calcium: 8.8 mg/dL — ABNORMAL LOW (ref 8.9–10.3)
Chloride: 104 mmol/L (ref 98–111)
Creatinine, Ser: 0.7 mg/dL (ref 0.44–1.00)
GFR, Estimated: >60 mL/min (ref >60)
Glucose, Bld: 90 mg/dL (ref 70–99)
Potassium: 3.3 mmol/L — ABNORMAL LOW (ref 3.5–5.1)
Sodium: 138 mmol/L (ref 135–145)

## 2023-03-19 LAB — BRAIN NATRIURETIC PEPTIDE: B Natriuretic Peptide: 75.9 pg/mL (ref 0.0–100.0)

## 2023-03-19 MED ORDER — FUROSEMIDE 40 MG PO TABS
40.0000 mg | ORAL_TABLET | Freq: Every day | ORAL | 0 refills | Status: DC
Start: 2023-03-19 — End: 2023-06-12
  Filled 2023-03-19: qty 3, 3d supply, fill #0

## 2023-03-19 NOTE — ED Triage Notes (Signed)
Patient present to UC for body aches since Saturday. Sore throat, HA, fever, cough, bilateral ear pain since yesterday.  Thursday and Friday she had diarrhea.  days. Treating symptoms with tylenol and mucinex. Fever (99.8-101.7). States she had a covid test this morning and it had a faint line. States she is doing a covid test through her employer.

## 2023-03-19 NOTE — Telephone Encounter (Signed)
Patient is calling back about mychart message she sent.   Went to urgent care just left. There was told to contact you. I called the number. I'm still waiting on someone to call me back, but the doctor there did x-rays and shows mild fluid buildup in my heart. He says congestive heart failure he did prescribe some Lasix to take that off. I was advised to contact your office to see what you guys wanted me to do. Could you please get back with me or call me and let me know what's going on? Like as soon as possible

## 2023-03-19 NOTE — Telephone Encounter (Signed)
Spoke with patient and she reports going to urgent care due to congestion, coughing, and chest discomfort which she feels all of this is from a cold. She said the doctor there told her she had fluid around her heart and mentioned acute congestive heart failure. He put her on Lasix 40 mg once daily for 1 week and also potasium because her potassium was 3.3. She feels that all of this is due to a cold which she reports that he did not address at that visit which is what she went to see them about. She reports being negative for COVID & strep. Confirmed her appointment for Thursday and advised that provider can review both her echo and chest xray with her at that time. Reviewed strict ED precautions for the chest pain and discouraged her from taking anything with Pseudoephedrine. She verbalized understanding of our conversation, agreeable with plan, and had no further questions at this time.

## 2023-03-19 NOTE — Discharge Instructions (Addendum)
As we discussed, your chest x-ray shows mild fluid buildup which is concerning for acute exacerbation of your congestive heart failure.  Your potassium was also found to be low and want you to continue taking your 40 mEq of potassium replacement 3 times a day.  I am going to add on 40 mg of Lasix once daily for the next 3 days to help pull off excess fluid.  You should keep your follow-up appointment with cardiology on Thursday as previously scheduled.  I would contact your cardiologist when you leave here to let them know that you are being treated outpatient for congestive heart failure in case they want to see you sooner.  In any event you need to have repeat labs drawn in 3 days to reevaluate your potassium.  If you develop any worsening shortness of breath, chest pain, or dizziness you need to call 911 and go to the ER.

## 2023-03-19 NOTE — ED Provider Notes (Signed)
[facility]    CSN: [id number] Arrival date & time: 03/19/23  0940      History   Chief Complaint Chief Complaint  Patient presents with   Fever   Sore Throat   Generalized Body Aches    HPI Lisa Davila is a 47 y.o. female.   HPI  47 year old female with a significant past medical history to include CHF, hyperlipidemia, pernicious anemia, anxiety, hypertension, CAD, SVT, and GERD presents for evaluation of respiratory symptoms which began 2 days ago.  She took a home COVID test and reports a faintly positive line.  She came today because she developed chest tightness and is using her albuterol inhaler more than normal.  She is also endorsing headache, bilateral ear pain, sore throat, and fever that started yesterday.  She reports a Tmax of one 101.7.  She has been using Tylenol and Mucinex.  Past Medical History:  Diagnosis Date   Acute ethmoidal sinusitis    Acute ethmoidal sinusitis    Acute maxillary sinusitis    Acute upper respiratory infections of unspecified site    Anxiety state, unspecified    Chest pain, unspecified    CHF (congestive heart failure) (HCC)    Coronary artery disease    Depression    Diarrhea    Esophageal reflux    Generalized anxiety disorder    Hyperlipidemia    Hypertension    Hypoglycemia, unspecified    Lumbago    Nausea with vomiting    Obstructive chronic bronchitis with exacerbation (HCC)    Other and unspecified hyperlipidemia    Other and unspecified peripheral vertigo(386.19)    Other bursitis disorders    Other malaise and fatigue    Other specified diseases due to viruses    Other vitamin B12 deficiency anemia    Pain in limb    Pernicious anemia    Sleep related hypoventilation/hypoxemia in conditions classifiable elsewhere    SVT (supraventricular tachycardia) (HCC)    Swelling, mass, or lump in head and neck    Tobacco use disorder    Unspecified disorder of external ear    Unspecified disorder of  skin and subcutaneous tissue    Unspecified otitis media     Patient Active Problem List   Diagnosis Date Noted   NSTEMI (non-ST elevated myocardial infarction) (HCC) 03/13/2022   Hypotension 03/13/2022   Chronic systolic heart failure (HCC)    Coronary artery disease    ST elevation myocardial infarction (STEMI) of inferior wall (HCC) 07/04/2021   SVT (supraventricular tachycardia) (HCC) 07/04/2021   Depression with anxiety 07/04/2021   Hyperlipidemia 07/04/2021   Tobacco user 07/04/2021   Leukocytosis 07/04/2021   Diarrhea 07/04/2021   Hx of cervical cancer 06/01/2020   Hypertension 06/01/2020   Migraines 06/01/2020   Sialolithiasis 06/01/2020   Tobacco use disorder 06/01/2020   Hoarseness 04/06/2020   Oral thrush 04/06/2020   Leg cramping 03/25/2020   Iatrogenic adrenal insufficiency (HCC) 02/20/2020   Hypokalemia 01/25/2020   Weakness of both legs 01/25/2020   Nontraumatic incomplete tear of left rotator cuff 01/20/2019   Tendinitis of upper biceps tendon of left shoulder 01/20/2019   Rotator cuff tendinitis, left 12/30/2018   Palpitation 01/08/2018   Hypercholesteremia 01/08/2018   Stress at work 01/08/2018   Trochanteric bursitis of left hip 11/30/2016   Benzodiazepine misuse 05/03/2016   Cervical cancer screening 05/03/2016   Chronic sinusitis 03/16/2015   Nasal obstruction 03/16/2015   High risk medication use 11/03/2014   Other long  term (current) drug therapy 11/03/2014   Polypharmacy 11/03/2014   Arthralgia of temporomandibular joint, unspecified side 10/24/2013   Carpal tunnel syndrome 08/04/2013   Lateral epicondylitis 08/04/2013   Knee pain 02/03/2013   Muscle strain 02/03/2013   Pain in left knee 02/03/2013   Shoulder pain 02/03/2013   Dysphagia, pharyngeal 09/11/2012   Cardiac arrhythmia, unspecified 07/29/2012   Contraceptive management 07/29/2012   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   Contraception 06/18/2012   Fatigue  06/18/2012   Stye 03/21/2012   Nausea 02/23/2012   Fibromyalgia 01/17/2012   Myalgia and myositis 01/17/2012   Deficiency of other specified B group vitamins 07/19/2011   Depression 07/19/2011   Gastro-esophageal reflux disease without esophagitis 07/19/2011   Generalized anxiety disorder 07/19/2011   Low back pain 07/19/2011    Past Surgical History:  Procedure Laterality Date   CESAREAN SECTION     CORONARY STENT INTERVENTION N/A 03/14/2022   Procedure: CORONARY STENT INTERVENTION;  Surgeon: Yvonne Kendall, MD;  Location: [facility];  Service: Cardiovascular;  Laterality: N/A;   CORONARY THROMBECTOMY N/A 03/14/2022   Procedure: Coronary Thrombectomy;  Surgeon: Yvonne Kendall, MD;  Location: [facility];  Service: Cardiovascular;  Laterality: N/A;   CORONARY/GRAFT ACUTE MI REVASCULARIZATION N/A 07/04/2021   Procedure: Coronary/Graft Acute MI Revascularization;  Surgeon: Iran Ouch, MD;  Location: [facility];  Service: Cardiovascular;  Laterality: N/A;   LEFT HEART CATH AND CORONARY ANGIOGRAPHY N/A 07/04/2021   Procedure: LEFT HEART CATH AND CORONARY ANGIOGRAPHY;  Surgeon: Iran Ouch, MD;  Location: [facility];  Service: Cardiovascular;  Laterality: N/A;   LEFT HEART CATH AND CORONARY ANGIOGRAPHY N/A 03/14/2022   Procedure: LEFT HEART CATH AND CORONARY ANGIOGRAPHY;  Surgeon: Yvonne Kendall, MD;  Location: [facility];  Service: Cardiovascular;  Laterality: N/A;   LEFT HEART CATH AND CORONARY ANGIOGRAPHY N/A 06/26/2022   Procedure: LEFT HEART CATH AND CORONARY ANGIOGRAPHY;  Surgeon: Iran Ouch, MD;  Location: [facility];  Service: Cardiovascular;  Laterality: N/A;   RIGHT/LEFT HEART CATH AND CORONARY ANGIOGRAPHY N/A 11/07/2021   Procedure: RIGHT/LEFT HEART CATH AND CORONARY ANGIOGRAPHY;  Surgeon: Iran Ouch, MD;  Location: [facility];  Service: Cardiovascular;  Laterality: N/A;    OB History    No obstetric history on file.      Home Medications    Prior to Admission medications   Medication Sig Start Date End Date Taking? Authorizing Provider  furosemide (LASIX) 40 MG tablet Take 1 tablet (40 mg total) by mouth daily. 03/19/23  Yes Becky Augusta, NP  albuterol (VENTOLIN HFA) 108 (90 Base) MCG/ACT inhaler Inhale 2 puffs into the lungs every 6 (six) hours as needed. 12/19/22     ALPRAZolam (XANAX) 0.5 MG tablet Take 1 tablet (0.5 mg total) by mouth 3 (three) times daily as needed for Sleep Patient not taking: Reported on 02/13/2023 01/27/22     ALPRAZolam (XANAX) 0.5 MG tablet Take 1 tablet (0.5 mg total) by mouth 3 (three) times daily as needed for sleep. 01/11/23     aspirin 81 MG chewable tablet Chew 1 tablet (81 mg total) by mouth daily. 07/06/21   Tresa Moore, MD  cyanocobalamin (VITAMIN B12) 1000 MCG tablet Take 1 tablet (1,000 mcg total) by mouth once daily 01/27/22     cyanocobalamin 1000 MCG tablet Take 1 tablet (1,000 mcg total) by mouth daily. Patient not taking: Reported on 02/13/2023 11/06/22  Evolocumab (REPATHA SURECLICK) 140 MG/ML SOAJ Inject 140 mg into the skin every 14 (fourteen) days. Patient not taking: Reported on 10/18/2022 09/08/22   Sondra Barges, PA-C  fluconazole (DIFLUCAN) 100 MG tablet Take 1 tablet (100 mg total) by mouth daily for 14 days. Patient not taking: Reported on 02/13/2023 10/04/22     ipratropium-albuterol (DUONEB) 0.5-2.5 (3) MG/3ML SOLN Take 3 mls by nebulization 4 (four) times daily for 30 days 07/29/21   Alm Bustard, NP  metoprolol succinate (TOPROL-XL) 50 MG 24 hr tablet Take 1 tablet (50 mg total) by mouth daily. Overdue follow up.  PLEASE CALL OFFICE TO SCHEDULE APPOINTMENT PRIOR TO NEXT REFILL 02/13/23 02/08/24  Charlsie Quest, NP  nicotine (NICODERM CQ - DOSED IN MG/24 HOURS) 14 mg/24hr patch Place 1 patch (14 mg total) onto the skin daily. 02/13/23   Charlsie Quest, NP  nitroGLYCERIN (NITROSTAT) 0.4 MG SL tablet Place 1 tablet (0.4 mg  total) under the tongue every 5 (five) minutes as needed for chest pain. 03/15/22   Tresa Moore, MD  ofloxacin (OCUFLOX) 0.3 % ophthalmic solution Place 1 drop into the left eye 4 (four) times daily. Patient not taking: Reported on 02/13/2023 01/18/23     potassium chloride (KLOR-CON M) 10 MEQ tablet Take 4 tablets (40 mEq total) by mouth daily. 03/13/23   Charlsie Quest, NP  prasugrel (EFFIENT) 10 MG TABS tablet Take 1 tablet (10 mg total) by mouth daily. 02/13/23 02/08/24  Charlsie Quest, NP  rosuvastatin (CRESTOR) 40 MG tablet Take 1 tablet (40 mg total) by mouth daily. 02/13/23 02/08/24  Charlsie Quest, NP  spironolactone (ALDACTONE) 25 MG tablet Take 1 tablet (25 mg total) by mouth daily. 10/18/22 10/13/23  Iran Ouch, MD  VITAMIN D PO Take by mouth daily at 12 noon.    [provider]    Family History Family History  Problem Relation Age of Onset   Depression Mother    Cancer Father        Father   Hypertension Sister    Breast cancer Maternal Grandmother    Cancer Paternal Grandfather        Breast     Social History Social History   Tobacco Use   Smoking status: Former    Current packs/day: 0.00    Types: Cigarettes    Start date: 03/13/1992    Quit date: 03/13/2022    Years since quitting: 1.0   Smokeless tobacco: Never   Tobacco comments:    Has been smoking for 30 years Wants to quit after she gets past all the heart stuff and changes in life.  Smoking 3 a day right now  Vaping Use   Vaping status: Never Used  Substance Use Topics   Alcohol use: No   Drug use: Never     Allergies   Buspirone, Citalopram, Metrizamide, Sulfa antibiotics, Contrast media [iodinated contrast media], Sulfa drugs cross reactors, and Sulfasalazine   Review of Systems Review of Systems  Constitutional:  Positive for fever.  HENT:  Positive for congestion, ear pain, rhinorrhea and sore throat.   Respiratory:  Positive for cough, chest tightness, shortness of breath and  wheezing.   Cardiovascular:  Positive for chest pain.  Neurological:  Positive for headaches.     Physical Exam Triage Vital Signs ED Triage Vitals  Encounter Vitals Group     BP      Systolic BP Percentile      Diastolic BP Percentile  Pulse      Resp      Temp      Temp src      SpO2      Weight      Height      Head Circumference      Peak Flow      Pain Score      Pain Loc      Pain Education      Exclude from Growth Chart    No data found.  Updated Vital Signs BP 106/74 (BP Location: Left Arm)   Pulse 85   Temp 98.4 F (36.9 C) (Oral)   SpO2 94%   Visual Acuity Right Eye Distance:   Left Eye Distance:   Bilateral Distance:    Right Eye Near:   Left Eye Near:    Bilateral Near:     Physical Exam Vitals and nursing note reviewed.  Constitutional:      Appearance: Normal appearance. She is not ill-appearing.  HENT:     Head: Normocephalic and atraumatic.     Right Ear: Tympanic membrane, ear canal and external ear normal. There is no impacted cerumen.     Left Ear: Tympanic membrane, ear canal and external ear normal. There is no impacted cerumen.     Nose: Congestion and rhinorrhea present.     Comments: Patient mucosa is edematous erythematous with clear discharge in both nares.    Mouth/Throat:     Mouth: Mucous membranes are moist.     Pharynx: Oropharynx is clear. Posterior oropharyngeal erythema present. No oropharyngeal exudate.     Comments: Bilateral tonsillar pillars are 2+ edematous and erythematous but free of exudate.  Posterior oropharynx demonstrates erythema. Cardiovascular:     Rate and Rhythm: Normal rate and regular rhythm.     Pulses: Normal pulses.     Heart sounds: Normal heart sounds. No murmur heard.    No friction rub. No gallop.  Pulmonary:     Effort: Pulmonary effort is normal.     Breath sounds: Normal breath sounds. No wheezing, rhonchi or rales.  Musculoskeletal:     Cervical back: Normal range of motion and neck  supple. No tenderness.  Lymphadenopathy:     Cervical: No cervical adenopathy.  Skin:    General: Skin is warm and dry.     Capillary Refill: Capillary refill takes less than 2 seconds.     Findings: No rash.  Neurological:     General: No focal deficit present.     Mental Status: She is alert and oriented to person, place, and time.      UC Treatments / Results  Labs (all labs ordered are listed, but only abnormal results are displayed) Labs Reviewed  BASIC METABOLIC PANEL - Abnormal; Notable for the following components:      Result Value   Potassium 3.3 (*)    BUN 5 (*)    Calcium 8.8 (*)    All other components within normal limits  GROUP A STREP BY PCR  RESP PANEL BY RT-PCR (FLU A&B, COVID) [id number]  BRAIN NATRIURETIC PEPTIDE    EKG Sinus rhythm with occasional PVCs. Ventricular rate 80 bpm PR interval 106 ms QRS duration 88 ms QT/QTc 410/496 ms ST and T wave abnormalities noted.   Radiology No results found.  Procedures Procedures (including critical care time)  Medications Ordered in UC Medications - No data to display  Initial Impression / Assessment and Plan / UC Course  I have reviewed  the triage vital signs and the nursing notes.  Pertinent labs & imaging results that were available during my care of the patient were reviewed by me and considered in my medical decision making (see chart for details).   Is a nontoxic-appearing 47 year old female with a significant cardiovascular history presenting for evaluation of 2 days with respiratory symptoms and fever as well as a positive home COVID test.  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  She is able speak in full sentence with dyspnea or tachypnea though her room air oxygen saturation is 92%.  She is also endorsing chest pain and chest tightness which started this morning.  Her cardiopulmonary exam reveals S1-S2 heart sounds with regular rate and  rhythm and lung sounds that are clear to auscultation in all fields.  Given that she works in healthcare and has significant cluster of symptoms I will order a PCR to evaluate for the presence of influenza, strep PCR, BMP, and EKG.  I will also do a chest x-ray to evaluate for any acute cardiopulmonary process.  Patient reports that she would prefer to have confirmatory COVID test performed by her employer, however, our respiratory panel automatically comes with a COVID so we will have confirmatory testing prior to discharge.  If patient's confirmatory test is positive we will plan to discharge home on Paxlovid.  EKG shows sinus rhythm with occasional PVCs.  There is a flattening of the ST segment in precordial and lateral leads.  This is similar to EKG from 02/13/2023.  Strep PCR is negative.  BMP shows mild hypokalemia with potassium 3.3.  Renal function is normal with a BUN of 5 and a creatinine 0.7.  Chest x-ray independently reviewed and evaluated by me.  Impression: Patient has patchy haziness to bilateral bases as well as blunting of the costophrenic angle.  No definitive infiltrate identified.  Radiology overread is pending.  Patient reports that she has been experiencing swelling of both of her lower extremities.  On exam now she does not have any appreciable swelling and her DP and PT pulses are 2+ bilaterally.  Cap refills less than 3 seconds.  Patient did have an echocardiogram 3 days ago which shows her left ventricular ejection fraction being between 40 and 45%.  Right ventricular function is normal left and right atrial sizes are also normal.  No pericardial effusion.,  Mitral, tricuspid, and pulmonic valves were all normal in appearance without any regurgitation.  The aortic valve is not well-visualized but there is no evidence of regurgitation.  Respiratory panel is negative for both COVID or influenza.  I discussed the patient's case with Dr. Rachael Darby and she has added on a proBNP as  she is in agreement that her chest x-ray looks mildly fluid overloaded.  Patient is scheduled to see cardiology on Thursday of this week.  She is currently on potassium replacement orally with 40 mill equivalents 3 times daily that she started yesterday.  She reports that the previous potassium that she was ordered were too big for her to swallow.  I will discharge her home with a diagnosis of CHF and put her on 40 mg of Lasix once daily for 3 days.  She needs to follow-up with her cardiologist as scheduled on Thursday for repeat evaluation and lab work.  She should contact her cardiologist when she leaves to notify her that she has been treated for a CHF exacerbation outpatient.   Final Clinical  Impressions(s) / UC Diagnoses   Final diagnoses:  Acute on chronic congestive heart failure, unspecified heart failure type (HCC)  Hypokalemia     Discharge Instructions      As we discussed, your chest x-ray shows mild fluid buildup which is concerning for acute exacerbation of your congestive heart failure.  Your potassium was also found to be low and want you to continue taking your 40 mEq of potassium replacement 3 times a day.  I am going to add on 40 mg of Lasix once daily for the next 3 days to help pull off excess fluid.  You should keep your follow-up appointment with cardiology on Thursday as previously scheduled.  I would contact your cardiologist when you leave here to let them know that you are being treated outpatient for congestive heart failure in case they want to see you sooner.  In any event you need to have repeat labs drawn in 3 days to reevaluate your potassium.  If you develop any worsening shortness of breath, chest pain, or dizziness you need to call 911 and go to the ER.     ED Prescriptions     Medication Sig Dispense Auth. Provider   furosemide (LASIX) 40 MG tablet Take 1 tablet (40 mg total) by mouth daily. 3 tablet Becky Augusta, NP      PDMP not reviewed this  encounter.   Becky Augusta, NP 03/19/23 1149

## 2023-03-20 ENCOUNTER — Other Ambulatory Visit: Payer: Self-pay

## 2023-03-20 DIAGNOSIS — I471 Supraventricular tachycardia, unspecified: Secondary | ICD-10-CM

## 2023-03-20 DIAGNOSIS — I214 Non-ST elevation (NSTEMI) myocardial infarction: Secondary | ICD-10-CM

## 2023-03-20 MED ORDER — POTASSIUM CHLORIDE ER 8 MEQ PO TBCR
64.0000 meq | EXTENDED_RELEASE_TABLET | Freq: Every day | ORAL | 3 refills | Status: DC
  Filled 2023-03-20: qty 540, 90d supply, fill #0
  Filled 2023-03-22: qty 200, 25d supply, fill #0
  Filled 2023-03-22: qty 40, 5d supply, fill #0
  Filled 2023-04-03: qty 200, 25d supply, fill #1
  Filled 2023-04-20: qty 350, 44d supply, fill #1
  Filled 2023-04-20: qty 370, 30d supply, fill #1

## 2023-03-20 MED ORDER — CEFUROXIME AXETIL 250 MG PO TABS
250.0000 mg | ORAL_TABLET | Freq: Two times a day (BID) | ORAL | 0 refills | Status: AC
  Filled 2023-03-20: qty 20, 10d supply, fill #0

## 2023-03-20 MED ORDER — PREDNISONE 10 MG PO TABS
ORAL_TABLET | ORAL | 0 refills | Status: AC
  Filled 2023-03-20: qty 18, 9d supply, fill #0

## 2023-03-20 MED ORDER — DULOXETINE HCL 30 MG PO CPEP
30.0000 mg | ORAL_CAPSULE | Freq: Every day | ORAL | 1 refills | Status: AC
  Filled 2023-03-20 (×2): qty 30, 30d supply, fill #0

## 2023-03-20 MED ORDER — HYDROCOD POLI-CHLORPHE POLI ER 10-8 MG/5ML PO SUER
5.0000 mL | Freq: Two times a day (BID) | ORAL | 0 refills | Status: AC | PRN
  Filled 2023-03-20 – 2023-03-22 (×2): qty 115, 12d supply, fill #0

## 2023-03-20 NOTE — Telephone Encounter (Signed)
Please schedule her for some follow up labs after being on Lasix with her upcoming echo scheduled this week. The echo will give better information as far as the heart failure. Please make sure she had a follow up scheduled after the echo has been completed.

## 2023-03-21 ENCOUNTER — Other Ambulatory Visit: Payer: Self-pay

## 2023-03-21 ENCOUNTER — Telehealth: Payer: Self-pay | Admitting: Cardiovascular Disease

## 2023-03-21 ENCOUNTER — Emergency Department: Payer: 59

## 2023-03-21 ENCOUNTER — Encounter: Payer: Self-pay | Admitting: Emergency Medicine

## 2023-03-21 ENCOUNTER — Emergency Department
Admission: EM | Admit: 2023-03-21 | Discharge: 2023-03-21 | Disposition: A | Discharge status: Home / Self Care | Payer: 59 | Attending: Emergency Medicine | Admitting: Emergency Medicine

## 2023-03-21 DIAGNOSIS — I509 Heart failure, unspecified: Secondary | ICD-10-CM | POA: Diagnosis not present

## 2023-03-21 DIAGNOSIS — F17200 Nicotine dependence, unspecified, uncomplicated: Secondary | ICD-10-CM | POA: Diagnosis not present

## 2023-03-21 DIAGNOSIS — R0902 Hypoxemia: Secondary | ICD-10-CM | POA: Diagnosis not present

## 2023-03-21 DIAGNOSIS — I2510 Atherosclerotic heart disease of native coronary artery without angina pectoris: Secondary | ICD-10-CM | POA: Diagnosis not present

## 2023-03-21 DIAGNOSIS — E876 Hypokalemia: Secondary | ICD-10-CM

## 2023-03-21 DIAGNOSIS — R9431 Abnormal electrocardiogram [ECG] [EKG]: Secondary | ICD-10-CM

## 2023-03-21 DIAGNOSIS — R079 Chest pain, unspecified: Secondary | ICD-10-CM | POA: Diagnosis not present

## 2023-03-21 DIAGNOSIS — J4541 Moderate persistent asthma with (acute) exacerbation: Secondary | ICD-10-CM

## 2023-03-21 DIAGNOSIS — R0600 Dyspnea, unspecified: Secondary | ICD-10-CM

## 2023-03-21 DIAGNOSIS — R0789 Other chest pain: Secondary | ICD-10-CM | POA: Diagnosis not present

## 2023-03-21 DIAGNOSIS — R0602 Shortness of breath: Secondary | ICD-10-CM | POA: Diagnosis not present

## 2023-03-21 LAB — BASIC METABOLIC PANEL
Anion gap: 10 (ref 5–15)
BUN: 7 mg/dL (ref 6–20)
CO2: 28 mmol/L (ref 22–32)
Calcium: 9 mg/dL (ref 8.9–10.3)
Chloride: 99 mmol/L (ref 98–111)
Creatinine, Ser: 0.93 mg/dL (ref 0.44–1.00)
GFR, Estimated: >60 mL/min (ref >60)
Glucose, Bld: 150 mg/dL — ABNORMAL HIGH (ref 70–99)
Potassium: 2.9 mmol/L — ABNORMAL LOW (ref 3.5–5.1)
Sodium: 137 mmol/L (ref 135–145)

## 2023-03-21 LAB — CBC
HCT: 43.3 % (ref 36.0–46.0)
Hemoglobin: 14.4 g/dL (ref 12.0–15.0)
MCH: 31.8 pg (ref 26.0–34.0)
MCHC: 33.3 g/dL (ref 30.0–36.0)
MCV: 95.6 fL (ref 80.0–100.0)
Platelets: 301 10*3/uL (ref 150–400)
RBC: 4.53 MIL/uL (ref 3.87–5.11)
RDW: 12.4 % (ref 11.5–15.5)
WBC: 10.7 10*3/uL — ABNORMAL HIGH (ref 4.0–10.5)
nRBC: 0 % (ref 0.0–0.2)

## 2023-03-21 LAB — D-DIMER, QUANTITATIVE: D-Dimer, Quant: 0.41 ug{FEU}/mL (ref 0.00–0.50)

## 2023-03-21 LAB — TROPONIN I (HIGH SENSITIVITY)
Troponin I (High Sensitivity): 4 ng/L (ref <18)
Troponin I (High Sensitivity): 10 ng/L (ref <18)

## 2023-03-21 LAB — MAGNESIUM: Magnesium: 2.1 mg/dL (ref 1.7–2.4)

## 2023-03-21 LAB — BRAIN NATRIURETIC PEPTIDE: B Natriuretic Peptide: 76.1 pg/mL (ref 0.0–100.0)

## 2023-03-21 MED ORDER — POTASSIUM CHLORIDE 10 MEQ/100ML IV SOLN: 10.0000 meq | Freq: Once | INTRAVENOUS | Status: DC

## 2023-03-21 MED ORDER — POTASSIUM CHLORIDE CRYS ER 20 MEQ PO TBCR: 40.0000 meq | EXTENDED_RELEASE_TABLET | Freq: Once | ORAL | Status: DC

## 2023-03-21 MED ORDER — ALBUTEROL SULFATE HFA 108 (90 BASE) MCG/ACT IN AERS: 2.0000 | INHALATION_SPRAY | Freq: Four times a day (QID) | RESPIRATORY_TRACT | 2 refills | Status: AC | PRN

## 2023-03-21 MED ORDER — METHYLPREDNISOLONE SODIUM SUCC 125 MG IJ SOLR: 125.0000 mg | Freq: Once | INTRAMUSCULAR | Status: DC

## 2023-03-21 MED ORDER — IPRATROPIUM-ALBUTEROL 0.5-2.5 (3) MG/3ML IN SOLN
6.0000 mL | Freq: Once | RESPIRATORY_TRACT | Status: AC
  Administered 2023-03-21: 6 mL via RESPIRATORY_TRACT
  Filled 2023-03-21: qty 6

## 2023-03-21 MED ORDER — AMOXICILLIN-POT CLAVULANATE 875-125 MG PO TABS
1.0000 | ORAL_TABLET | Freq: Two times a day (BID) | ORAL | 0 refills | Status: AC
Start: 2023-03-21 — End: 2023-03-29
  Filled 2023-03-22: qty 14, 7d supply, fill #0

## 2023-03-21 MED ORDER — AEROCHAMBER PLUS FLO-VU LARGE MISC: 1.0000 | Freq: Once | 0 refills | Status: AC

## 2023-03-21 MED ORDER — SODIUM CHLORIDE 0.9 % IV BOLUS: 500.0000 mL | Freq: Once | INTRAVENOUS | Status: DC

## 2023-03-21 MED ORDER — CEFDINIR 300 MG PO CAPS
300.0000 mg | ORAL_CAPSULE | Freq: Two times a day (BID) | ORAL | 0 refills | Status: DC
  Filled 2023-03-21 – 2023-03-22 (×2): qty 20, 10d supply, fill #0

## 2023-03-21 NOTE — Telephone Encounter (Signed)
The patient has been made aware and will go to the ED

## 2023-03-21 NOTE — ED Notes (Signed)
Pt states that they started having URI symptoms on Sunday and went to urgent care on Monday and was prescribed lasix because they were told they have fluid around their heart. Pt has taken the dose as prescribed (1x/day @ 40mg ). Pt states that they feel some relief with the tightness in their chest after they take the lasix, and with the use of their rescue inhaler. Pt is also on prednisone from her PCP yesterday. Pt is seeing their cardiologist tomorrow (has Hx of 2 MI's last year) and was told by cardiologist to come to the ER to get checked out just in case. Pt has a cough that is congested sounding and reports coughing up green colored mucous. Pt reports not feeling CP, but pressure on their chest when they cough.

## 2023-03-21 NOTE — ED Notes (Signed)
Pt reports to this RN she and Dr. Arnoldo Morale had agreed to her discharge with the understanding she would take her potassium at home.

## 2023-03-21 NOTE — Telephone Encounter (Signed)
Sounds like with her symptoms she would benefit from being evaluated in the emergency department. Sounds like she is or is on the way to being in heart failure and will likely require diuresis.  She has a strong history of coronary artery disease with multiple catheterizations.  She will likely need further ischemic workup as well.

## 2023-03-21 NOTE — ED Provider Notes (Signed)
[facility] Provider Note    Event Date/Time   First MD Initiated Contact with Patient 03/21/23 1635     (approximate)   History   Chest Pain   HPI  Lisa Davila is a 47 y.o. female past medical history significant for tobacco abuse, CAD, CHF with an EF of 45%, history of adrenal insufficiency presents to the emergency department shortness of breath.  Patient endorses ongoing shortness of breath that started on Friday.  States that she had routine follow-up with her cardiologist on Friday for repeat echocardiogram.  EF had improved from 35% to 45%.  States the following day she had significantly shortness of breath and cough.  Had a fever up to 101 on Saturday, stated seated with cough and congestion.  Evaluated and was negative for COVID, RSV and influenza.  Returned yesterday for a follow-up visit for shortness of breath and cough.  Started on cefdinir for presumed pneumonia.  States that she was having tingling sensation to her lips and was concerned that she was having an allergic reaction to the cefdinir.  Took steroids earlier today.  Worsening shortness of breath and chest pressure which brought her into the emergency department today.  Intermittently has leg swelling to the left leg.  Diagnosed with heart failure exacerbation and given p.o. Lasix.  Called cardiology today and was told to come to the emergency department for ongoing symptoms.  On arrival patient was found to be hypoxic with an oxygen saturation in the high 80s.  Patient states that she was having trouble with her oxygen dropping in the 80s yesterday.     Physical Exam   Triage Vital Signs: ED Triage Vitals  Encounter Vitals Group     BP 03/21/23 1551 113/78     Systolic BP Percentile --      Diastolic BP Percentile --      Pulse Rate 03/21/23 1551 (!) 102     Resp 03/21/23 1551 18     Temp 03/21/23 1551 98 F (36.7 C)     Temp Source 03/21/23 1551 Oral     SpO2 03/21/23 1551 95 %      Weight --      Height --      Head Circumference --      Peak Flow --      Pain Score 03/21/23 1552 8     Pain Loc --      Pain Education --      Exclude from Growth Chart --     Most recent vital signs: Vitals:   03/21/23 1551 03/21/23 1553  BP: 113/78   Pulse: (!) 102   Resp: 18   Temp: 98 F (36.7 C)   SpO2: 95% 92%    Physical Exam Constitutional:      Appearance: She is well-developed.  HENT:     Head: Atraumatic.  Eyes:     Conjunctiva/sclera: Conjunctivae normal.  Cardiovascular:     Rate and Rhythm: Regular rhythm.     Heart sounds: Normal heart sounds.  Pulmonary:     Effort: Tachypnea present.     Breath sounds: Wheezing present.     Comments: 87%, placed on 2 L nasal cannula.  Diffuse inspiratory and expiratory wheezing throughout all lung fields.  Coarse breath sounds throughout. Abdominal:     General: There is no distension.     Palpations: Abdomen is soft.  Musculoskeletal:        General: Normal range of motion.  Cervical back: Normal range of motion.     Comments: Trace pitting edema to bilateral lower extremities.  No unilateral leg swelling.  Skin:    General: Skin is warm.     Capillary Refill: Capillary refill takes less than 2 seconds.  Neurological:     Mental Status: She is alert. Mental status is at baseline.     IMPRESSION / MDM / ASSESSMENT AND PLAN / ED COURSE  I reviewed the triage vital signs and the nursing notes.  Differential diagnosis including COPD diagnosis/exacerbation, ACS, bacterial pneumonia, heart failure exacerbation, pulmonary embolism  EKG  I, Corena Herter, the attending physician, personally viewed and interpreted this ECG.  EKG with normal sinus rhythm.  T wave inverted to lead III and flattening in inferior leads.  T wave inverted to septal leads.  No significant ST elevation or depression.  Prolonged QTc at 524.  No tachycardic or bradycardic dysrhythmias while on cardiac telemetry.  RADIOLOGY I  independently reviewed imaging, my interpretation of imaging: Chest x-ray with no focal findings consistent with pneumonia  LABS (all labs ordered are listed, but only abnormal results are displayed) Labs interpreted as -    Labs Reviewed  BASIC METABOLIC PANEL - Abnormal; Notable for the following components:      Result Value   Potassium 2.9 (*)    Glucose, Bld 150 (*)    All other components within normal limits  CBC - Abnormal; Notable for the following components:   WBC 10.7 (*)    All other components within normal limits  BRAIN NATRIURETIC PEPTIDE  D-DIMER, QUANTITATIVE  MAGNESIUM  POC URINE PREG, ED  TROPONIN I (HIGH SENSITIVITY)  TROPONIN I (HIGH SENSITIVITY)     MDM  Found to be hypoxic on arrival.  Patient given DuoNeb treatment given significant wheezing on exam.  Given IV Solu-Medrol.  Potassium low at 2.9, given IV replacement and p.o. replacement.  Low risk Wells criteria, D-dimer obtained.  Prolonged QTc.  Discussed this finding with the patient.  Low risk Wells criteria, D-dimer is negative, have low suspicion for pulmonary embolism.  No signs of pneumonia on chest x-ray.  Patient with significant wheezing on exam.  Serial troponins are negative, have low suspicion for ACS.  Clinical picture is most consistent with asthma/COPD exacerbation, ongoing tobacco use.  On reevaluation states she is feeling much better after DuoNeb treatments.  Given IV Solu-Medrol in the emergency department.  Ambulatory oxygen saturation at 99/100%.  Patient states she is feeling much better.  She had a concern for cefdinir being an allergy, will switch to Augmentin.  Has a follow-up appointment tomorrow with her cardiologist.  Discussed close follow-up with her primary care provider.  Discussed at length smoking cessation.  Given return precautions for any worsening symptoms.       PROCEDURES:  Critical Care performed: No  Procedures  Patient's presentation is most consistent  with acute presentation with potential threat to life or bodily function.   MEDICATIONS ORDERED IN ED: Medications  potassium chloride 10 mEq in 100 mL IVPB (has no administration in time range)  sodium chloride 0.9 % bolus 500 mL (has no administration in time range)  methylPREDNISolone sodium succinate (SOLU-MEDROL) 125 mg/2 mL injection 125 mg (has no administration in time range)  potassium chloride SA (KLOR-CON M) CR tablet 40 mEq (has no administration in time range)  ipratropium-albuterol (DUONEB) 0.5-2.5 (3) MG/3ML nebulizer solution 6 mL (6 mLs Nebulization Given 03/21/23 1753)    FINAL CLINICAL IMPRESSION(S) / ED  DIAGNOSES   Final diagnoses:  Hypoxia  Dyspnea, unspecified type  Moderate persistent asthma with exacerbation  Prolonged QT interval  Hypokalemia     Rx / DC Orders   ED Discharge Orders          Ordered    albuterol (VENTOLIN HFA) 108 (90 Base) MCG/ACT inhaler  Every 6 hours PRN        03/21/23 2029    Spacer/Aero-Holding Chambers (AEROCHAMBER PLUS FLO-VU LARGE) MISC   Once        03/21/23 2029    amoxicillin-clavulanate (AUGMENTIN) 875-125 MG tablet  2 times daily        03/21/23 2029             Note:  This document was prepared using Dragon voice recognition software and may include unintentional dictation errors.   Corena Herter, MD 03/21/23 2036

## 2023-03-21 NOTE — Telephone Encounter (Signed)
Pt c/o of Chest Pain: STAT if CP now or developed within 24 hours  1. Are you having CP right now? 5 mins ago  2. Are you experiencing any other symptoms (ex. SOB, nausea, vomiting, sweating)? Sometimes she feels SOB   3. How long have you been experiencing CP? Since Sunday.   4. Is your CP continuous or coming and going? Coming and going  5. Have you taken Nitroglycerin? No, because it's not an actual pain just tightness.  ?

## 2023-03-21 NOTE — Discharge Instructions (Addendum)
You were seen in the emergency department with for shortness of breath and cough.  You had a chest x-ray done that did not show any signs of an obvious pneumonia.  You had significant wheezing in the emergency department.  You had 2 heart enzymes that were normal, do not believe you are having a heart attack today.  Your D-dimer was negative, do not believe that you are having a blood clot. Stop smoking, talk to your PCP about help with tobacco cessation.  Your potassium level was low in the emergency department.  You were given IV and oral replacement.  Continue to take your home oral potassium tablets.   Make sure to eat food high in potassium and magnesium - examples - potatoes, spinach, bananas, beans, avocadoes, oranges, nuts.  Use urine 2 to 4 puffs of albuterol inhaler every 4-6 hours as needed for shortness of breath and wheezing.  You were switched from cefdinir to Augmentin.  Continue on your steroid dose.  Return to the emergency department if you have any worsening symptoms.  Follow-up closely with your primary care physician and your cardiologist.  Thank you for choosing [place] for your health care, it was my pleasure to care for you today!  Corena Herter, MD

## 2023-03-21 NOTE — ED Triage Notes (Addendum)
Pt with increasing shob and chest discomfort.  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  Was seen yesterday and started on lasix yesterday.  States they found "fluid around her heart"  Bilateral lower extremity swelling as well.  Cardiologist did echo on Friday.  Was advised by cards to come to the ED for continued shob and chest tightness. Pt also reports she was started on Ceftin yesterday and had a reaction where her lips went numb.  Requested this be added to her allergy list.

## 2023-03-21 NOTE — Telephone Encounter (Signed)
The patient called in with complaints of intermittent chest discomfort. She stated that this is not a pain. This occurs mainly at rest and sometimes when she ambulates. She is also experiencing shortness of breath when she ambulates.   She was seen by Urgent Care on 11/11 and prescribed furosemide 40 mg once daily for three days. She stated that the chest discomfort eases for a few hours after she takes the Furosemide and then will start back again.  She is also very congested and had continuous coughing while on the phone. She stated that she will be going to the ED tonight for further evaluation after she gets off of work. She has a follow up scheduled on 11/14.

## 2023-03-22 ENCOUNTER — Ambulatory Visit: Payer: 59 | Attending: Cardiology | Admitting: Cardiology

## 2023-03-22 ENCOUNTER — Other Ambulatory Visit: Payer: Self-pay

## 2023-03-22 ENCOUNTER — Other Ambulatory Visit: Payer: Self-pay | Admitting: Cardiology

## 2023-03-22 ENCOUNTER — Encounter: Payer: Self-pay | Admitting: Cardiology

## 2023-03-22 VITALS — BP 98/66 | HR 100 | Ht 61.0 in | Wt 179.6 lb

## 2023-03-22 DIAGNOSIS — J069 Acute upper respiratory infection, unspecified: Secondary | ICD-10-CM | POA: Diagnosis not present

## 2023-03-22 DIAGNOSIS — E876 Hypokalemia: Secondary | ICD-10-CM

## 2023-03-22 DIAGNOSIS — I25118 Atherosclerotic heart disease of native coronary artery with other forms of angina pectoris: Secondary | ICD-10-CM

## 2023-03-22 DIAGNOSIS — I255 Ischemic cardiomyopathy: Secondary | ICD-10-CM | POA: Diagnosis not present

## 2023-03-22 DIAGNOSIS — Z720 Tobacco use: Secondary | ICD-10-CM

## 2023-03-22 DIAGNOSIS — E785 Hyperlipidemia, unspecified: Secondary | ICD-10-CM | POA: Diagnosis not present

## 2023-03-22 DIAGNOSIS — I471 Supraventricular tachycardia, unspecified: Secondary | ICD-10-CM

## 2023-03-22 DIAGNOSIS — I5020 Unspecified systolic (congestive) heart failure: Secondary | ICD-10-CM | POA: Diagnosis not present

## 2023-03-22 MED ORDER — POTASSIUM CHLORIDE 40 MEQ/15ML (20%) PO SOLN
60.0000 meq | Freq: Every day | ORAL | 0 refills | Status: DC
  Filled 2023-03-22: qty 675, 30d supply, fill #0

## 2023-03-22 MED ORDER — FLUCONAZOLE 150 MG PO TABS
150.0000 mg | ORAL_TABLET | Freq: Every day | ORAL | 0 refills | Status: AC
  Filled 2023-03-22 – 2023-04-03 (×2): qty 1, 1d supply, fill #0

## 2023-03-22 NOTE — Progress Notes (Signed)
Cardiology Office Note:  .   Date:  03/22/2023  ID:  Lisa Davila, DOB April 10, 1976, MRN [id number] PCP: Barbette Reichmann, MD  [facility] Providers Cardiologist:  Lorine Bears, MD    History of Present Illness: .   Lisa Davila is a 47 y.o. female with past medical history of coronary disease status post ST elevation myocardial infarction status post PCI/DES to RCA in 06/2021 status post mechanical thrombectomy and DES placement in 03/2022 for late stent thrombosis, HFrEF, PSVT status post prior EP study with no inducible arrhythmia, fibromyalgia, hypertension, hyperlipidemia, tobacco abuse, anxiety, hypokalemia, who presents today for follow-up after recent emergency department visit.   She originally presented in 06/2021 with inferior ST elevated myocardial infarction with emergent left heart cath revealing thrombotic subtotal occlusion of the proximal RCA. This was treated successfully with PCI/DES. There was mild nonobstructive disease affecting the left coronary arteries. Echo at that time revealed an EF of 50-55%. The Brilinta was transitioned to clopidogrel secondary to persistent dyspnea. Following the diagnosis of COVID, she had recurrent chest pain multiple emergency room visits following her MI. Repeat echo in 08/2021 showed EF of 35%. Due to her drop in EF and symptoms, MR/LHC was performed in 11/2021 which showed patent RCA stent and mild in-stent restenosis with no evidence of obstructive disease affecting the left coronary arteries. EF was 35%. RHC showed mildly to moderately elevated filling pressures, minimal pulmonary hypertension, normal cardiac output. She presented to the hospital on 03/2022 with a recurrent MI. Left heart catheterization 03/2022 showed severe single-vessel CAD with occlusion of the proximal/mid RCA with late stent thrombosis and heavy thrombus burden. Nonobstructive CAD was observed in the left coronary artery similar to catheter 11/2021. She underwent  successful mechanical thrombectomy and DES placement to the proximal/mid RCA. Echo in 03/2022 showed an EF of 35-40%, hypokinesis of the left ventricular lateral wall and inferior lateral wall, mild LVH, G1 DD trivial mitral regurgitation. She was seen 06/2018 for having been recently diagnosed with COVID and reported chest tightness similar to her prior angina. Given that she underwent left heart catheterization in 06/26/2022 that showed widely patent RCA stents with no significant restenosis with otherwise nonobstructive disease as outlined. LVEF 45-50% by visual estimate. Medical therapy was recommended along with titration of metoprolol XL 20 mg daily for improvement in sinus tachycardia.    She was last seen in clinic 02/13/2023 with complaints of continued chest tightness and chest pressure that was substernal that happens with rest or exertion not relieved with use of Nitrostat.  She did have a left-sided greater several occasions for reassurance.  She was scheduled for an echocardiogram.  She was evaluated by [place] urgent [facility] on 03/19/2023 with complaint of fever, sore throat, generalized bodyaches.  Stated that her symptoms began 2 days prior.  Home COVID test was faintly positive.  Tmax of 0.1.7.  Had been continuously taken Tylenol and Mucinex for symptoms.  Vital signs were stable.  She continued to have congestion and rhinorrhea.  She called into the nurse triage line with complaints of intermittent chest discomfort.  She stated that it was not pain and occurred mainly at rest and sometimes when she ambulates.  She was also experiencing shortness of breath when she ambulates.  She was evaluated urgent care on 11/11 and prescribed furosemide once daily for 3 days.  She stated that chest discomfort eases for few hours when she takes furosemide and that will start back again.  She was very congested and  had continuous coughing while on the phone.  She states she was going to the ED tonight for further  evaluation when she got out of work.  She was advised to continue with emergency department evaluation of there was concern for failure if she had a bacterial or viral infection.  Was evaluated in the emergency department on 03/27/2023 with complaint of chest pain.  Previous evaluation revealed she was negative for COVID, RSV, and influenza.  Started on cefdinir for presumed pneumonia.  Stated she had a stabbing tingling sensation to her lips with concern that she may have 5 mg to the cefdinir.  Take steroids daily today.  Worsening shortness of breath and chest pressure which brought her to the emergency department today.  Intermittently has swelling to the left leg diagnosed with heart failure exacerbation given oral Lasix in urgent care.  Called cardiology and told to come to the emergency department ongoing symptoms patient was found to be hypoxic on arrival with oxygen saturation in the 80s.  She was noted to be tachycardic with a heart rate of 102.  Pertinent labs revealed a potassium of 2.9, blood glucose 150, WBCs of 10.7.  High-sensitivity troponins were negative so there was low suspicion for ACS.  Clinical history is most consistent with asthma/COPD exacerbation with ongoing tobacco use.  Reevaluation she was feeling much better after DuoNeb treatments and IV Solu-Medrol given in the emergency department.  Ambulatory oxygen saturation 99/100%.  Patient was feeling much better and she had a concern for cefdinir allergy and she was switched to Augmentin.  It was advised to keep her outpatient follow-up appointment with cardiology.  She returns to clinic today with continued nasal congestion, cough, intermittent swelling to her lower extremities, she continued to have of exertional shortness of breath but denied any chest pain or chest pressure.  She stated that when she is coughing or becomes extremely short of breath she does have some chest tightness then it resolves.  She states that it is typically  with rest or exertion.  She stated in the emergency department she was stuck several times for labs and tried to get an IV that was unsuccessful as her potassium was not treated in the emergency department.  She stated that she was concerned over an allergic reaction to cefdinir so antibiotic was changed to Augmentin.  She denies any increased swelling to her bilateral lower extremities chest x-ray was clear in the emergency department and her echocardiogram did not reveal effusion and had an improved ejection fraction.  Likely more of an asthma/COPD exacerbation versus heart failure exacerbation.  ROS: 10 point review of systems has been reviewed and considered negative with exception was been listed in the HPI  Studies Reviewed: .       2D echo 03/16/2023 1. Left ventricular ejection fraction, by estimation, is 40 to 45%. Left  ventricular ejection fraction by PLAX is 44 %. The left ventricle has mild  to moderately decreased function. The left ventricle demonstrates global  hypokinesis. Left ventricular  diastolic parameters are consistent with Grade I diastolic dysfunction  (impaired relaxation).   2. Right ventricular systolic function is normal. The right ventricular  size is normal.   3. The mitral valve is normal in structure. No evidence of mitral valve  regurgitation.   4. The aortic valve was not well visualized. Aortic valve regurgitation  is not visualized.   LHC 06/26/2022:   Mid LAD lesion is 30% stenosed.   2nd Diag lesion is 50%  stenosed.   Mid RCA lesion is 30% stenosed.   Non-stenotic Prox RCA to Mid RCA lesion was previously treated.   There is mild left ventricular systolic dysfunction.   LV end diastolic pressure is normal.   The left ventricular ejection fraction is 45-50% by visual estimate.   1.  Widely patent RCA stents with no significant restenosis. 2.  Mildly reduced LV systolic function with normal LVEDP.   Recommendations: Continue aggressive medical  therapy. Increase Toprol to 100 mg once daily for better control of sinus tachycardia.   LHC 03/14/2022: Conclusions: Severe single vessel CAD with occlusion of proximal/mid RCA with late stent thrombosis and heavy thrombus burden. Non-obstructive coronary artery disease observed in the left coronary artery, similar to prior cath from 11/2021. Mildly elevated left ventricular filling pressure (LVEDP 20 mmHg). Successful mechanical thrombectomy and drug-eluting stent placement to proximal/mid RCA using Onyx Frontier 3.0 x 26 mm drug-eluting stent (postdilated up to 4.0 mm) with 0% residual stenosis and TIMI-3 flow.   Recommendations: Tirofiban infusion x 4 hours. Continue dual antiplatelet therapy with aspirin and prasugrel for at least 12 months.  Check CYP 2C19 genotype to assess clopidogrel response. Follow-up echocardiogram. Aggressive secondary prevention of coronary artery disease and escalation of evidence-based heart failure therapy as blood pressure allows.   2D echo 03/14/2022: 1. Left ventricular ejection fraction, by estimation, is 35 to 40%. The  left ventricle has moderately decreased function. The left ventricle  demonstrates regional wall motion abnormalities (see scoring  diagram/findings for description). There is mild  left ventricular hypertrophy. Left ventricular diastolic parameters are  consistent with Grade I diastolic dysfunction (impaired relaxation). There  is severe hypokinesis of the left ventricular, entire lateral wall and  inferolateral wall.   2. Right ventricular systolic function is mildly reduced. The right  ventricular size is normal.   3. The mitral valve is normal in structure. Trivial mitral valve  regurgitation.   4. The aortic valve was not well visualized. Aortic valve regurgitation  is not visualized. No aortic stenosis is present.    Zio patch 12/2021: Patient had a min HR of 59 bpm, max HR of 123 bpm, and avg HR of 89 bpm. Predominant  underlying rhythm was Sinus Rhythm.  Rare PACs and rare PVCs. No significant arrhythmia overall.   R/LHC 11/07/2021:   Mid LAD lesion is 30% stenosed.   Prox RCA lesion is 20% stenosed.   2nd Diag lesion is 50% stenosed.   There is moderate left ventricular systolic dysfunction.   LV end diastolic pressure is mildly elevated.   1.  Patent proximal RCA stent with mild in-stent restenosis.  No obstructive disease affecting the left coronary artery system. 2.  Moderately reduced LV systolic function with an EF of 35% with global hypokinesis that is more prominent in the inferior apical area. 3.  Right heart catheterization showed mildly to moderately elevated filling pressures, minimal pulmonary hypertension and normal cardiac output.   Recommendations: Continue medical therapy for coronary artery disease. Optimize heart failure treatment.  The patient is volume overloaded.  I increased Lasix to 40 mg once daily.  In addition, due to resting sinus tachycardia, increased Toprol to 50 mg once daily. We will plan on adding an ARB and SGLT2 inhibitor upon follow-up.   2D echo 07/04/2021: 1. Left ventricular ejection fraction, by estimation, is 50 to 55%. The  left ventricle has low normal function. The left ventricle has no regional  wall motion abnormalities. There is mild left ventricular hypertrophy.  Left ventricular diastolic  parameters are indeterminate.   2. Right ventricular systolic function is normal. The right ventricular  size is normal.   3. The mitral valve is normal in structure. Trivial mitral valve  regurgitation. No evidence of mitral stenosis.   4. The aortic valve is normal in structure. Aortic valve regurgitation is  not visualized. No aortic stenosis is present.   5. The inferior vena cava is normal in size with greater than 50%  respiratory variability, suggesting right atrial pressure of 3 mmHg.    LHC 07/04/2021:   Prox RCA lesion is 99% stenosed.   Mid LAD lesion  is 30% stenosed.   A drug-eluting stent was successfully placed using a STENT ONYX FRONTIER 4.0X22.   Post intervention, there is a 0% residual stenosis.   There is mild left ventricular systolic dysfunction.   LV end diastolic pressure is mildly elevated.   The left ventricular ejection fraction is 45-50% by visual estimate.   1.  Severe one-vessel coronary artery disease with thrombotic subtotal occlusion of the proximal right coronary artery which is the culprit for inferior ST elevation myocardial infarction.  Mild disease affecting the left coronary system. 2.  Mildly reduced LV systolic function and mildly elevated left ventricular end-diastolic pressure at 17 mmHg. 3.  Successful angioplasty and drug-eluting stent placement to the right coronary artery.   Recommendations: Given large thrombus burden, continue Aggrastat infusion for 12 hours. Dual antiplatelet therapy is recommended for at least 12 months. Aggressive treatment of risk factors and smoking cessation. Risk Assessment/Calculations:             Physical Exam:   VS:  BP 98/66 (BP Location: Left Arm, Patient Position: Sitting, Cuff Size: Large)   Pulse 100   Ht 5\' 1"  (1.549 m)   Wt 179 lb 9.6 oz (81.5 kg)   SpO2 98%   BMI 33.94 kg/m    Wt Readings from Last 3 Encounters:  03/22/23 179 lb 9.6 oz (81.5 kg)  02/13/23 181 lb 3.2 oz (82.2 kg)  10/18/22 178 lb 8 oz (81 kg)    GEN: Well nourished, well developed in no acute distress NECK: No JVD; No carotid bruits CARDIAC: RRR, no murmurs, rubs, gallops RESPIRATORY:  Clear to auscultation without rales, wheezing or rhonchi  ABDOMEN: Soft, non-tender, non-distended EXTREMITIES:  No edema; No deformity   ASSESSMENT AND PLAN: .   Upper respiratory infection/asthma/COPD exacerbation with continued tobacco use.  Continued on DuoNebs, steroids, Tussionex, and Augmentin.  Previously treated in the emergency department with clear chest x-ray with elevated WBCs likely reactive  from steroid use.  Coronary artery disease of the native coronary artery with stable angina.  She continues to report intermittent randomly recurrent chest discomfort without radiation that is short-lived with rest or exertion.  Last left heart catheterization was in 06/2022 demonstrated patent RCA stent otherwise nonobstructive disease involving the left coronary tree.  She is continued on aspirin 81 mg daily, Effient 10 mg daily, rosuvastatin 40 mg daily and Repatha 140 mg every 2 weeks.  Chronic HFrEF/hemic cardiomyopathy with last LVEF 40-45% which is improved from prior studies with G1 DD, and no evidence of valvular abnormalities.  Echocardiogram has improved from prior study.  She continues to have shortness of breath but is currently on antibiotics for upper respiratory infection.  She is continued on spironolactone 25 mg daily, Toprol-XL 50 mg daily, she is euvolemic on exam today take furosemide 20 as needed basis she states she only had 2  days left of furosemide.  Will reevaluate need on return.  Blood pressure remains soft today prohibiting addition of ACE/ARB/ARNI.  Mixed hyperlipidemia with an LDL 64 in 10/2022 with previous LP(a) of 81.7.  Continued on rosuvastatin 40 mg daily and her medication list today continues to say that she is on Repatha 140 mg daily.  PSVT which is quiescent on Toprol-XL.  Tobacco use with total cessation continues to be recommended.  Hypokalemia with serum potassium of 2.9.  Potassium has been increased to 16 mEq daily with a BMP in 2 weeks.  She was asking for liquid potassium.  She received notification from the pharmacy that they had 8 mEq tablets that she believed was small enough she would be able to swallow so she will be taking 64 mEq of potassium daily with a repeat BMP in a week for reevaluation.       Dispo: Patient to return to clinic to see MD/APP in 6 weeks or sooner for reevaluation of her current symptoms  Signed, Lido Maske, NP

## 2023-03-22 NOTE — Patient Instructions (Signed)
Medication Instructions:  Your physician has recommended you make the following change in your medication:  START: Potassium Chloride 40 MEQ/15ML (20%) SOLN - Take 60 mEq by mouth daily  *If you need a refill on your cardiac medications before your next appointment, please call your pharmacy*  Lab Work: Your provider would like for you to return in 2 weeks to have the following labs drawn: BMET.   Please go to [facility] [address] ([facility] [address], [place] [address] You do not need an appointment.  They are open from 7:30 am-4 pm.  Lunch from 1:00 pm- 2:00 pm You DO NOT need to be fasting.   If you have labs (blood work) drawn today and your tests are completely normal, you will receive your results only by: MyChart Message (if you have MyChart) OR A paper copy in the mail If you have any lab test that is abnormal or we need to change your treatment, we will call you to review the results.  Testing/Procedures: - None  Follow-Up: At [facility], you and your health needs are our priority.  As part of our continuing mission to provide you with exceptional heart care, we have created designated Provider Care Teams.  These Care Teams include your primary Cardiologist (physician) and Advanced Practice Providers (APPs -  Physician Assistants and Nurse Practitioners) who all work together to provide you with the care you need, when you need it.  Your next appointment:   6 week(s)  Provider:   Charlsie Quest, NP    Other Instructions - None

## 2023-03-27 ENCOUNTER — Other Ambulatory Visit: Payer: Self-pay

## 2023-04-03 ENCOUNTER — Other Ambulatory Visit: Payer: Self-pay

## 2023-04-17 ENCOUNTER — Other Ambulatory Visit: Payer: Self-pay

## 2023-04-17 MED ORDER — ALPRAZOLAM 0.5 MG PO TABS
0.5000 mg | ORAL_TABLET | Freq: Three times a day (TID) | ORAL | 0 refills | Status: DC | PRN
  Filled 2023-04-17: qty 90, 30d supply, fill #0

## 2023-04-20 ENCOUNTER — Other Ambulatory Visit: Payer: Self-pay

## 2023-04-23 ENCOUNTER — Other Ambulatory Visit: Payer: Self-pay

## 2023-04-30 ENCOUNTER — Other Ambulatory Visit: Payer: Self-pay

## 2023-05-01 DIAGNOSIS — J209 Acute bronchitis, unspecified: Secondary | ICD-10-CM | POA: Diagnosis not present

## 2023-05-04 ENCOUNTER — Other Ambulatory Visit: Payer: Self-pay

## 2023-05-17 ENCOUNTER — Other Ambulatory Visit: Payer: Self-pay

## 2023-05-17 MED ORDER — ALPRAZOLAM 0.5 MG PO TABS
0.5000 mg | ORAL_TABLET | Freq: Three times a day (TID) | ORAL | 0 refills | Status: AC | PRN
  Filled 2023-05-17: qty 90, 30d supply, fill #0

## 2023-05-18 ENCOUNTER — Other Ambulatory Visit: Payer: Self-pay

## 2023-05-21 ENCOUNTER — Other Ambulatory Visit: Payer: Self-pay | Admitting: Cardiovascular Disease

## 2023-05-21 ENCOUNTER — Other Ambulatory Visit: Payer: Self-pay

## 2023-05-21 MED FILL — Spironolactone Tab 25 MG: ORAL | 30 days supply | Qty: 30 | Fill #0 | Status: AC

## 2023-05-21 NOTE — Telephone Encounter (Signed)
 Last office visit: 03/22/23 with plan to f/u 6 weeks next office visit: none/no active recall  Please schedule f/u appt.  Thanks!

## 2023-05-21 NOTE — Telephone Encounter (Signed)
Please contact pt for future appointment. Pt due for 6 wk f/u.

## 2023-05-22 ENCOUNTER — Other Ambulatory Visit: Payer: Self-pay

## 2023-06-05 DIAGNOSIS — N390 Urinary tract infection, site not specified: Secondary | ICD-10-CM | POA: Diagnosis not present

## 2023-06-05 DIAGNOSIS — B029 Zoster without complications: Secondary | ICD-10-CM | POA: Diagnosis not present

## 2023-06-05 DIAGNOSIS — R300 Dysuria: Secondary | ICD-10-CM | POA: Diagnosis not present

## 2023-06-06 ENCOUNTER — Other Ambulatory Visit: Payer: Self-pay

## 2023-06-06 ENCOUNTER — Emergency Department: Payer: 59

## 2023-06-06 DIAGNOSIS — R932 Abnormal findings on diagnostic imaging of liver and biliary tract: Secondary | ICD-10-CM | POA: Diagnosis not present

## 2023-06-06 DIAGNOSIS — R1011 Right upper quadrant pain: Secondary | ICD-10-CM | POA: Diagnosis not present

## 2023-06-06 DIAGNOSIS — Z5321 Procedure and treatment not carried out due to patient leaving prior to being seen by health care provider: Secondary | ICD-10-CM | POA: Insufficient documentation

## 2023-06-06 DIAGNOSIS — Z955 Presence of coronary angioplasty implant and graft: Secondary | ICD-10-CM | POA: Diagnosis not present

## 2023-06-06 DIAGNOSIS — I252 Old myocardial infarction: Secondary | ICD-10-CM | POA: Insufficient documentation

## 2023-06-06 DIAGNOSIS — R0789 Other chest pain: Secondary | ICD-10-CM | POA: Insufficient documentation

## 2023-06-06 DIAGNOSIS — R110 Nausea: Secondary | ICD-10-CM | POA: Insufficient documentation

## 2023-06-06 DIAGNOSIS — B029 Zoster without complications: Secondary | ICD-10-CM | POA: Diagnosis not present

## 2023-06-06 DIAGNOSIS — R079 Chest pain, unspecified: Secondary | ICD-10-CM | POA: Diagnosis not present

## 2023-06-06 DIAGNOSIS — I2510 Atherosclerotic heart disease of native coronary artery without angina pectoris: Secondary | ICD-10-CM | POA: Diagnosis not present

## 2023-06-06 LAB — CBC
HCT: 43.6 % (ref 36.0–46.0)
Hemoglobin: 14.7 g/dL (ref 12.0–15.0)
MCH: 32.2 pg (ref 26.0–34.0)
MCHC: 33.7 g/dL (ref 30.0–36.0)
MCV: 95.4 fL (ref 80.0–100.0)
Platelets: 295 10*3/uL (ref 150–400)
RBC: 4.57 MIL/uL (ref 3.87–5.11)
RDW: 13.1 % (ref 11.5–15.5)
WBC: 11 10*3/uL — ABNORMAL HIGH (ref 4.0–10.5)
nRBC: 0 % (ref 0.0–0.2)

## 2023-06-06 LAB — BASIC METABOLIC PANEL
Anion gap: 9 (ref 5–15)
BUN: 5 mg/dL — ABNORMAL LOW (ref 6–20)
CO2: 26 mmol/L (ref 22–32)
Calcium: 8.8 mg/dL — ABNORMAL LOW (ref 8.9–10.3)
Chloride: 105 mmol/L (ref 98–111)
Creatinine, Ser: 0.83 mg/dL (ref 0.44–1.00)
GFR, Estimated: >60 mL/min (ref >60)
Glucose, Bld: 91 mg/dL (ref 70–99)
Potassium: 3.7 mmol/L (ref 3.5–5.1)
Sodium: 140 mmol/L (ref 135–145)

## 2023-06-06 LAB — TROPONIN I (HIGH SENSITIVITY): Troponin I (High Sensitivity): 5 ng/L (ref <18)

## 2023-06-06 MED ORDER — VALACYCLOVIR HCL 1 G PO TABS
1000.0000 mg | ORAL_TABLET | Freq: Three times a day (TID) | ORAL | 0 refills | Status: AC
  Filled 2023-06-06: qty 21, 7d supply, fill #0

## 2023-06-06 NOTE — ED Triage Notes (Signed)
Pt to ED via EMS from home, pt reports centralized chest pain that radiates to the right side of her chest, pt states pain is sharp. Hx MI 1 year ago. Pt took 4 asa PTA and refused nitroglycerin with EMS. Pt repots having shingles to the left side of her neck currently.

## 2023-06-06 NOTE — ED Provider Triage Note (Signed)
Emergency Medicine Provider Triage Evaluation Note  Lisa Davila , a 48 y.o. female  was evaluated in triage.  Pt complains of chest pain that radiates to the right breast treated with nauseas.  Patient has history of 2 MI last year.  Review of Systems  Positive:  Negative:   Physical Exam  BP (!) 127/92   Pulse 87   Temp 98.4 F (36.9 C) (Oral)   Resp 18   Ht 5\' 1"  (1.549 m)   Wt 77.1 kg   SpO2 96%   BMI 32.12 kg/m  Gen:   Awake, no distress   Resp:  Normal effort  MSK:   Moves extremities without difficulty  Other:    Medical Decision Making  Medically screening exam initiated at 10:31 PM.  Appropriate orders placed.  Lisa Davila was informed that the remainder of the evaluation will be completed by another provider, this initial triage assessment does not replace that evaluation, and the importance of remaining in the ED until their evaluation is complete.  Patient with chest pain, ordered CBC CMP chest x-ray EKG   Gladys Damme, PA-C 06/06/23 2232

## 2023-06-07 ENCOUNTER — Other Ambulatory Visit: Payer: Self-pay

## 2023-06-07 ENCOUNTER — Emergency Department
Admission: EM | Admit: 2023-06-07 | Discharge: 2023-06-07 | Discharge status: Against Medical Advice | Payer: 59 | Attending: Emergency Medicine | Admitting: Emergency Medicine

## 2023-06-07 ENCOUNTER — Emergency Department: Payer: 59

## 2023-06-07 ENCOUNTER — Emergency Department
Admission: EM | Admit: 2023-06-07 | Discharge: 2023-06-07 | Disposition: A | Discharge status: Home / Self Care | Payer: 59 | Source: Home / Self Care | Attending: Emergency Medicine | Admitting: Emergency Medicine

## 2023-06-07 ENCOUNTER — Encounter: Payer: Self-pay | Admitting: Intensive Care

## 2023-06-07 ENCOUNTER — Telehealth: Payer: Self-pay | Admitting: Cardiovascular Disease

## 2023-06-07 DIAGNOSIS — R0789 Other chest pain: Secondary | ICD-10-CM | POA: Insufficient documentation

## 2023-06-07 DIAGNOSIS — R1011 Right upper quadrant pain: Secondary | ICD-10-CM | POA: Diagnosis not present

## 2023-06-07 DIAGNOSIS — I2510 Atherosclerotic heart disease of native coronary artery without angina pectoris: Secondary | ICD-10-CM | POA: Insufficient documentation

## 2023-06-07 DIAGNOSIS — R932 Abnormal findings on diagnostic imaging of liver and biliary tract: Secondary | ICD-10-CM | POA: Diagnosis not present

## 2023-06-07 LAB — CBC WITH DIFFERENTIAL/PLATELET
Abs Immature Granulocytes: 0.03 10*3/uL (ref 0.00–0.07)
Basophils Absolute: 0 10*3/uL (ref 0.0–0.1)
Basophils Relative: 0 %
Eosinophils Absolute: 0.2 10*3/uL (ref 0.0–0.5)
Eosinophils Relative: 2 %
HCT: 44.8 % (ref 36.0–46.0)
Hemoglobin: 14.9 g/dL (ref 12.0–15.0)
Immature Granulocytes: 0 %
Lymphocytes Relative: 37 %
Lymphs Abs: 3.6 10*3/uL (ref 0.7–4.0)
MCH: 32 pg (ref 26.0–34.0)
MCHC: 33.3 g/dL (ref 30.0–36.0)
MCV: 96.3 fL (ref 80.0–100.0)
Monocytes Absolute: 0.5 10*3/uL (ref 0.1–1.0)
Monocytes Relative: 5 %
Neutro Abs: 5.3 10*3/uL (ref 1.7–7.7)
Neutrophils Relative %: 56 %
Platelets: 293 10*3/uL (ref 150–400)
RBC: 4.65 MIL/uL (ref 3.87–5.11)
RDW: 13 % (ref 11.5–15.5)
WBC: 9.7 10*3/uL (ref 4.0–10.5)
nRBC: 0 % (ref 0.0–0.2)

## 2023-06-07 LAB — COMPREHENSIVE METABOLIC PANEL
ALT: 17 U/L (ref 0–44)
AST: 15 U/L (ref 15–41)
Albumin: 3.7 g/dL (ref 3.5–5.0)
Alkaline Phosphatase: 85 U/L (ref 38–126)
Anion gap: 8 (ref 5–15)
BUN: <5 mg/dL — ABNORMAL LOW (ref 6–20)
CO2: 27 mmol/L (ref 22–32)
Calcium: 9.3 mg/dL (ref 8.9–10.3)
Chloride: 106 mmol/L (ref 98–111)
Creatinine, Ser: 0.76 mg/dL (ref 0.44–1.00)
GFR, Estimated: >60 mL/min (ref >60)
Glucose, Bld: 71 mg/dL (ref 70–99)
Potassium: 4 mmol/L (ref 3.5–5.1)
Sodium: 141 mmol/L (ref 135–145)
Total Bilirubin: 0.4 mg/dL (ref 0.0–1.2)
Total Protein: 6.5 g/dL (ref 6.5–8.1)

## 2023-06-07 LAB — URINALYSIS, ROUTINE W REFLEX MICROSCOPIC
Bilirubin Urine: NEGATIVE
Glucose, UA: NEGATIVE mg/dL
Hgb urine dipstick: NEGATIVE
Ketones, ur: NEGATIVE mg/dL
Leukocytes,Ua: NEGATIVE
Nitrite: NEGATIVE
Protein, ur: NEGATIVE mg/dL
Specific Gravity, Urine: 1.001 — ABNORMAL LOW (ref 1.005–1.030)
pH: 6 (ref 5.0–8.0)

## 2023-06-07 LAB — TROPONIN I (HIGH SENSITIVITY): Troponin I (High Sensitivity): 4 ng/L (ref <18)

## 2023-06-07 LAB — LIPASE, BLOOD: Lipase: 31 U/L (ref 11–51)

## 2023-06-07 MED ORDER — LIDOCAINE 5 % EX PTCH
1.0000 | MEDICATED_PATCH | Freq: Two times a day (BID) | CUTANEOUS | 0 refills | Status: AC
  Filled 2023-06-07: qty 10, 10d supply, fill #0

## 2023-06-07 MED ORDER — OXYCODONE HCL 5 MG PO TABS
5.0000 mg | ORAL_TABLET | Freq: Four times a day (QID) | ORAL | 0 refills | Status: AC | PRN
  Filled 2023-06-07: qty 12, 3d supply, fill #0

## 2023-06-07 MED ORDER — OXYCODONE HCL 5 MG PO TABS: 5.0000 mg | ORAL_TABLET | Freq: Once | ORAL | Status: DC

## 2023-06-07 MED ORDER — LIDOCAINE 5 % EX PTCH
1.0000 | MEDICATED_PATCH | CUTANEOUS | Status: DC
  Administered 2023-06-07: 1 via TRANSDERMAL
  Filled 2023-06-07: qty 1

## 2023-06-07 NOTE — Discharge Instructions (Signed)
Take Tylenol 1 g every 8 hours use the lidocaine patches.  Use the oxycodone for breakthrough pain.  Follow-up with your primary care doctor to be reevaluated and discuss further with your cardiologist and return to the ER for worsening symptoms or any other concerns. Do not take with xanax.   Take oxycodone as prescribed. Do not drink alcohol, drive or participate in any other potentially dangerous activities while taking this medication as it may make you sleepy. Do not take this medication with any other sedating medications, either prescription or over-the-counter. If you were prescribed Percocet or Vicodin, do not take these with acetaminophen (Tylenol) as it is already contained within these medications.  This medication is an opiate (or narcotic) pain medication and can be habit forming. Use it as little as possible to achieve adequate pain control. Do not use or use it with extreme caution if you have a history of opiate abuse or dependence. If you are on a pain contract with your primary care doctor or a pain specialist, be sure to let them know you were prescribed this medication today from the Emergency Department. This medication is intended for your use only - do not give any to anyone else and keep it in a secure place where nobody else, especially children, have access to it.

## 2023-06-07 NOTE — ED Provider Notes (Signed)
[facility] Provider Note    Event Date/Time   First MD Initiated Contact with Patient 06/07/23 1537     (approximate)   History   Chest Pain   HPI  Lisa Davila is a 48 y.o. female with prior history of coronary disease who comes in with right-sided chest pain.  Patient left prior to second enzyme done yesterday for her chest pain so needed to have a second 1 done today.  Patient also currently being treated for shingles.  Patient reports that she was having a rash on the right underneath her breast as well as her left neck.  She reports having chest pain that started yesterday.  Denies any shortness of breath estrogen use recent long travel recent surgery swelling her legs or any other concerns.  No radiation of pain to the back.  Physical Exam   Triage Vital Signs: ED Triage Vitals  Encounter Vitals Group     BP 06/07/23 1254 130/88     Systolic BP Percentile --      Diastolic BP Percentile --      Pulse Rate 06/07/23 1254 87     Resp 06/07/23 1254 16     Temp 06/07/23 1254 98.1 F (36.7 C)     Temp Source 06/07/23 1254 Oral     SpO2 06/07/23 1254 99 %     Weight 06/07/23 1255 176 lb (79.8 kg)     Height 06/07/23 1255 5\' 1"  (1.549 m)     Head Circumference --      Peak Flow --      Pain Score 06/07/23 1254 10     Pain Loc --      Pain Education --      Exclude from Growth Chart --     Most recent vital signs: Vitals:   06/07/23 1254  BP: 130/88  Pulse: 87  Resp: 16  Temp: 98.1 F (36.7 C)  SpO2: 99%     General: Awake, no distress.  CV:  Good peripheral perfusion.  Resp:  Normal effort.  Abd:  No distention.  Other:  Patient has reproducible chest wall tenderness to the right side of her chest.  She has what looks like a little bit of bruises noted wrapping around and underneath the right breast but no vesicular rash at this time.  Good distal pulses throughout.  No numbness, no tingling.  Abdomen is soft and  nontender.   ED Results / Procedures / Treatments   Labs (all labs ordered are listed, but only abnormal results are displayed) Labs Reviewed  COMPREHENSIVE METABOLIC PANEL - Abnormal; Notable for the following components:      Result Value   BUN <5 (*)    All other components within normal limits  URINALYSIS, ROUTINE W REFLEX MICROSCOPIC - Abnormal; Notable for the following components:   Color, Urine COLORLESS (*)    APPearance CLEAR (*)    Specific Gravity, Urine 1.001 (*)    All other components within normal limits  LIPASE, BLOOD  CBC WITH DIFFERENTIAL/PLATELET  TROPONIN I (HIGH SENSITIVITY)  TROPONIN I (HIGH SENSITIVITY)     EKG  My interpretation of EKG:  Sinus rate of 87 any ST elevation, T wave version lead III, normal intervals.  Looks similar to prior EKG  RADIOLOGY I have reviewed the xray personally and interpreted from yesterday and this was negative for pneumonia  PROCEDURES:  Critical Care performed: No  Procedures   MEDICATIONS ORDERED IN ED: Medications  lidocaine (LIDODERM) 5 % 1 patch (1 patch Transdermal Patch Applied 06/07/23 1641)     IMPRESSION / MDM / ASSESSMENT AND PLAN / ED COURSE  I reviewed the triage vital signs and the nursing notes.   Patient's presentation is most consistent with acute presentation with potential threat to life or bodily function.   Patient comes in with chest pain.  Do not see a typical vesicular rash and she is got 1 singular bump on her left neck but we discussed that if this is shingles that coming up sometimes she could have pain or maybe it has already been treated to from when she was seen at the urgent care and the vesicles are resolving and she is just having post hepatic pain.  It is very reproducible on examination the pain.  She had troponins that were negative for ACS her abdomen is soft nontender an ultrasound was ordered from triage without evidence of gallstones.  At this time I suspect this is most  likely musculoskeletal consider potentially post shingles pain.  No indication for antibiotics no obvious cellulitis she is already on Cipro for UTI.  Considered CT imaging but doubt PE patient is PERC negative.  Given reproducible chest pain no red flag symptoms to suggest dissection.  At this time I think we can treat patient's pain with Tylenol.  Patient cannot take ibuprofen due to on blood thinner.  Will prescribe a few oxycodone.  She understands not to drive or work on the oxycodone.  IMPRESSION: 1. Increased hepatic parenchymal echogenicity suggestive of steatosis. 2. No cholelithiasis or sonographic evidence for acute cholecystitis.  CMP is reassuring troponin was negative.  Lipase normal CBC normal urine without evidence of UTI         FINAL CLINICAL IMPRESSION(S) / ED DIAGNOSES   Final diagnoses:  Atypical chest pain     Rx / DC Orders   ED Discharge Orders          Ordered    lidocaine (LIDODERM) 5 %  Every 12 hours        06/07/23 1628    oxyCODONE (ROXICODONE) 5 MG immediate release tablet  Every 6 hours PRN        06/07/23 1628             Note:  This document was prepared using Dragon voice recognition software and may include unintentional dictation errors.   Concha Se, MD 06/07/23 [phone]

## 2023-06-07 NOTE — ED Triage Notes (Signed)
Reports right sided chest pain since yesterday. Seen here at [facility] 06/07/23 and had chest pain workup. Reports speaking with her cardiologist this AM and sent back to ER for second heart enzyme.  Currently has shingles on left neck and reports also on right back side.

## 2023-06-07 NOTE — ED Notes (Signed)
No answer when called several times from lobby

## 2023-06-07 NOTE — ED Provider Triage Note (Signed)
Emergency Medicine Provider Triage Evaluation Note  Lisa Davila , a 48 y.o. female  was evaluated in triage.  Pt complains of right sided chest pain, no fever, chills. Also has shingles on left side, told by cardiologist she needed to have the second troponin done as she left prior to getting second enzyme yesterday.  Review of Systems  Positive:  Negative:   Physical Exam  There were no vitals taken for this visit. Gen:   Awake, no distress   Resp:  Normal effort  MSK:   Moves extremities without difficulty  Other:    Medical Decision Making  Medically screening exam initiated at 12:54 PM.  Appropriate orders placed.  Lisa Davila was informed that the remainder of the evaluation will be completed by another provider, this initial triage assessment does not replace that evaluation, and the importance of remaining in the ED until their evaluation is complete.  [place] ruq ordered, pt tender ruq   Faythe Ghee, PA-C 06/07/23 1255

## 2023-06-07 NOTE — Telephone Encounter (Signed)
Received call from STAT phone- patient states since last night around 7 PM she was having chest pains. She states it is continuous, does mention having having pain in the right side of her breast area. She has not had any improvement since last night. Patient did go to the ED however, did not stay. She does have a UTI and Shingles at this time as well. Patient would like for Dr.Arida to review her EKG, Chest xray and lab work. Patient does not take her nitroglycerins because she states she does not like the way it makes her feel. Patient advised if she was still having active chest pain with no relief to go back to the ED, however patient requested a message be sent to MD. Patient already has appointment scheduled for 02/04 with Charlsie Quest, NP.

## 2023-06-07 NOTE — ED Notes (Signed)
See triage note  Presents with some chest discomfort   States pain is mainly to right side of chest States she had work up yesterday

## 2023-06-07 NOTE — Telephone Encounter (Signed)
Pt c/o of Chest Pain: STAT if CP now or developed within 24 hours  1. Are you having CP right now? Yes   2. Are you experiencing any other symptoms (ex. SOB, nausea, vomiting, sweating)? No   3. How long have you been experiencing CP? Since 7 P.M. 01/29   4. Is your CP continuous or coming and going? Continuous   5. Have you taken Nitroglycerin? No   ?

## 2023-06-08 ENCOUNTER — Encounter: Payer: Self-pay | Admitting: Cardiology

## 2023-06-08 ENCOUNTER — Other Ambulatory Visit: Payer: Self-pay

## 2023-06-08 ENCOUNTER — Ambulatory Visit: Payer: 59 | Attending: Cardiology | Admitting: Cardiology

## 2023-06-08 ENCOUNTER — Telehealth: Payer: Self-pay | Admitting: Cardiovascular Disease

## 2023-06-08 VITALS — BP 110/74 | HR 85 | Ht 61.0 in | Wt 179.2 lb

## 2023-06-08 DIAGNOSIS — R072 Precordial pain: Secondary | ICD-10-CM

## 2023-06-08 DIAGNOSIS — I2510 Atherosclerotic heart disease of native coronary artery without angina pectoris: Secondary | ICD-10-CM

## 2023-06-08 DIAGNOSIS — I429 Cardiomyopathy, unspecified: Secondary | ICD-10-CM | POA: Diagnosis not present

## 2023-06-08 DIAGNOSIS — F17200 Nicotine dependence, unspecified, uncomplicated: Secondary | ICD-10-CM

## 2023-06-08 MED ORDER — RANOLAZINE ER 500 MG PO TB12
500.0000 mg | ORAL_TABLET | Freq: Two times a day (BID) | ORAL | 3 refills | Status: DC
  Filled 2023-06-08: qty 10, 5d supply, fill #0

## 2023-06-08 MED ORDER — METOPROLOL SUCCINATE ER 25 MG PO TB24
50.0000 mg | ORAL_TABLET | Freq: Every day | ORAL | 11 refills | Status: DC
  Filled 2023-06-08: qty 60, 30d supply, fill #0

## 2023-06-08 NOTE — Telephone Encounter (Signed)
Pt c/o of Chest Pain: STAT if active CP, including tightness, pressure, jaw pain, radiating pain to shoulder/upper arm/back, CP unrelieved by Nitro. Symptoms reported of SOB, nausea, vomiting, sweating.  1. Are you having CP right now? Yes, radiating across chest    2. Are you experiencing any other symptoms (ex. SOB, nausea, vomiting, sweating)? Nausea, headache, lightheadedness   3. Is your CP continuous or coming and going? continuous   4. Have you taken Nitroglycerin? Has not taken it   5. How long have you been experiencing CP? Started this past Tuesday, patient spoke to nurse yesterday and went to ED last night. Told they couldn't find anything and for her to folow up with her Cardiologist.   6. If NO CP at time of call then end call with telling Pt to call back or call 911 if Chest pain returns prior to return call from triage team.

## 2023-06-08 NOTE — Telephone Encounter (Signed)
Patient has an appointment 1/31 with DOD already scheduled.

## 2023-06-08 NOTE — Progress Notes (Signed)
Cardiology Office Note:    Date:  06/08/2023   ID:  ARRIANA LOHMANN, DOB 1975/08/25, MRN [id number]  PCP:  Barbette Reichmann, MD   [facility] Providers Cardiologist:  Lorine Bears, MD     Referring MD: Barbette Reichmann, MD   Chief Complaint  Patient presents with   Chest Pain    Patient concerned with continued right side chest pain that radiates the breast that has been evaluated by ED.   Has not taken NTH due to s/e headaches.    History of Present Illness:    Lisa Davila is a 48 y.o. 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  Endorse having chest pain located in her mid chest, radiating to her right breast.  Initially was seen at urgent care and was told she may have developed shingles.  She does not think it is shingles.  She endorsed having some itchiness in her right back.  Symptoms of chest discomfort are not associated with exertion, due to persistent symptoms, patient to the ED the past 2 days.  Troponins obtained were normal.  Currently smokes, has not taken or tried sublingual nitro due to history of headaches with nitrates.  Compliant with Toprol-XL 50 mg daily as prescribed.  Higher doses caused her blood pressure to be low.  Also takes Aldactone.  Not on ACE/ARB due to low normal BP at home.  Echocardiogram 03/2023 EF 40 to 45% Echo 03/2022 EF 35 to 40%  Left heart cath 06/2022 patent RCA stents.  Past Medical History:  Diagnosis Date   Acute ethmoidal sinusitis    Acute ethmoidal sinusitis    Acute maxillary sinusitis    Acute upper respiratory infections of unspecified site    Anxiety state, unspecified    Chest pain, unspecified    CHF (congestive heart failure) (HCC)    Coronary artery disease    Depression    Diarrhea    Esophageal reflux    Generalized anxiety disorder    Hyperlipidemia     Hypertension    Hypoglycemia, unspecified    Lumbago    Nausea with vomiting    Obstructive chronic bronchitis with exacerbation (HCC)    Other and unspecified hyperlipidemia    Other and unspecified peripheral vertigo(386.19)    Other bursitis disorders    Other malaise and fatigue    Other specified diseases due to viruses    Other vitamin B12 deficiency anemia    Pain in limb    Pernicious anemia    Sleep related hypoventilation/hypoxemia in conditions classifiable elsewhere    SVT (supraventricular tachycardia) (HCC)    Swelling, mass, or lump in head and neck    Tobacco use disorder    Unspecified disorder of external ear    Unspecified disorder of skin and subcutaneous tissue    Unspecified otitis media     Past Surgical History:  Procedure Laterality Date   CESAREAN SECTION     CORONARY STENT INTERVENTION N/A 03/14/2022   Procedure: CORONARY STENT INTERVENTION;  Surgeon: Yvonne Kendall, MD;  Location: [facility] INVASIVE CV LAB;  Service: Cardiovascular;  Laterality: N/A;   CORONARY THROMBECTOMY N/A 03/14/2022   Procedure: Coronary Thrombectomy;  Surgeon: Yvonne Kendall, MD;  Location: [facility] INVASIVE CV [facility];  Service: Cardiovascular;  Laterality: N/A;   CORONARY/GRAFT ACUTE MI REVASCULARIZATION N/A 07/04/2021   Procedure: Coronary/Graft Acute MI Revascularization;  Surgeon: Lorine Bears  A, MD;  Location: [facility] INVASIVE CV LAB;  Service: Cardiovascular;  Laterality: N/A;   LEFT HEART CATH AND CORONARY ANGIOGRAPHY N/A 07/04/2021   Procedure: LEFT HEART CATH AND CORONARY ANGIOGRAPHY;  Surgeon: Iran Ouch, MD;  Location: [facility] INVASIVE CV LAB;  Service: Cardiovascular;  Laterality: N/A;   LEFT HEART CATH AND CORONARY ANGIOGRAPHY N/A 03/14/2022   Procedure: LEFT HEART CATH AND CORONARY ANGIOGRAPHY;  Surgeon: Yvonne Kendall, MD;  Location: [facility] INVASIVE CV [facility];  Service: Cardiovascular;  Laterality: N/A;   LEFT HEART CATH AND CORONARY ANGIOGRAPHY N/A 06/26/2022   Procedure:  LEFT HEART CATH AND CORONARY ANGIOGRAPHY;  Surgeon: Iran Ouch, MD;  Location: [facility] INVASIVE CV [facility];  Service: Cardiovascular;  Laterality: N/A;   RIGHT/LEFT HEART CATH AND CORONARY ANGIOGRAPHY N/A 11/07/2021   Procedure: RIGHT/LEFT HEART CATH AND CORONARY ANGIOGRAPHY;  Surgeon: Iran Ouch, MD;  Location: [facility] INVAS[facility] CV [facility];  Service: Cardiovascular;  Laterality: N/A;    Current Medications: Current Meds  Medication Sig   albuterol (VENTOLIN HFA) 108 (90 Base) MCG/ACT inhaler Inhale 2 puffs into the lungs every 6 (six) hours as needed.   albuterol (VENTOLIN HFA) 108 (90 Base) MCG/ACT inhaler Inhale 2 puffs into the lungs every 6 (six) hours as needed for wheezing or shortness of breath.   metoprolol succinate (TOPROL-XL) 50 MG 24 hr tablet Take 1 tablet (50 mg total) by mouth daily. Overdue follow up.  PLEASE CALL OFFICE TO SCHEDULE APPOINTMENT PRIOR TO NEXT REFILL   nicotine (NICODERM CQ - DOSED IN MG/24 HOURS) 14 mg/24hr patch Place 1 patch (14 mg total) onto the skin daily.   nitroGLYCERIN (NITROSTAT) 0.4 MG SL tablet Place 1 tablet (0.4 mg total) under the tongue every 5 (five) minutes as needed for chest pain.   ranolazine (RANEXA) 500 MG 12 hr tablet Take 1 tablet (500 mg total) by mouth 2 (two) times daily.     Allergies:   Buspirone, Citalopram, Metrizamide, Sulfa antibiotics, Ceftin [cefuroxime], Contrast media [iodinated contrast media], Sulfa drugs cross reactors, and Sulfasalazine   Social History   Socioeconomic History   Marital status: Single    Spouse name: Not on file   Number of children: Not on file   Years of education: Not on file   Highest education level: Not on file  Occupational History   Not on file  Tobacco Use   Smoking status: Every Day    Current packs/day: 0.00    Types: Cigarettes    Start date: 03/13/1992    Last attempt to quit: 03/13/2022    Years since quitting: 1.2   Smokeless tobacco: Never   Tobacco comments:    Has been smoking  for 30 years Wants to quit after she gets past all the heart stuff and changes in life.  Smoking 3 a day right now  Vaping Use   Vaping status: Never Used  Substance and Sexual Activity   Alcohol use: No   Drug use: Never   Sexual activity: Not Currently  Other Topics Concern   Not on file  Social History Narrative   Lives with fiance, Mariel Kansky.   Social Drivers of Corporate investment banker Strain: Low Risk  (06/08/2023)   Received from [facility]   Overall Financial Resource Strain (CARDIA)    Difficulty of Paying Living Expenses: Not hard at all  Recent Concern: Financial Resource Strain - High Risk (03/26/2023)   Received from [facility]   Overall Financial Resource Strain (  CARDIA)    Difficulty of Paying Living Expenses: Hard  Food Insecurity: No Food Insecurity (06/08/2023)   Received from [facility]   Hunger Vital Sign    Worried About Running Out of Food in the Last Year: Never true    Ran Out of Food in the Last Year: Never true  Recent Concern: Food Insecurity - Food Insecurity Present (03/26/2023)   Received from [facility]   Hunger Vital Sign    Worried About Running Out of Food in the Last Year: Often true    Ran Out of Food in the Last Year: Often true  Transportation Needs: No Transportation Needs (06/08/2023)   Received from [facility] - Transportation    In the past 12 months, has lack of transportation kept you from medical appointments or from getting medications?: No    Lack of Transportation (Non-Medical): No  Physical Activity: Inactive (03/26/2023)   Received from [facility]   Exercise Vital Sign    Days of Exercise per Week: 0 days    Minutes of Exercise per Session: 0 min  Stress: Stress Concern Present (03/26/2023)   Received from [facility] - Occupational Stress  Questionnaire    Feeling of Stress : Very much  Social Connections: Socially Isolated (03/26/2023)   Received from [facility]   Social Connection and Isolation Panel [NHANES]    Frequency of Communication with Friends and Family: More than three times a week    Frequency of Social Gatherings with Friends and Family: More than three times a week    Attends Religious Services: Never    Database administrator or Organizations: No    Attends Engineer, structural: Never    Marital Status: Never married     Family History: The patient's family history includes Breast cancer in her maternal grandmother; Cancer in her father and paternal grandfather; Depression in her mother; Hypertension in her sister.  ROS:   Please see the history of present illness.     All other systems reviewed and are negative.  EKGs/Labs/Other Studies Reviewed:    The following studies were reviewed today:  EKG Interpretation Date/Time:  Friday June 08 2023 15:08:39 EST Ventricular Rate:  85 PR Interval:  168 QRS Duration:  94 QT Interval:  404 QTC Calculation: 480 R Axis:   2  Text Interpretation: Normal sinus rhythm Inferior infarct (cited on or before 06-Jun-2023) Confirmed by Debbe Odea ([id number]) on 06/08/2023 3:23:45 PM    Recent Labs: 03/21/2023: B Natriuretic Peptide 76.1; Magnesium 2.1 06/07/2023: ALT 17; BUN <5; Creatinine, Ser 0.76; Hemoglobin 14.9; Platelets 293; Potassium 4.0; Sodium 141  Recent Lipid Panel    Component Value Date/Time   CHOL 201 (H) 03/14/2022 0148   CHOL 280 (H) 11/29/2011 1326   TRIG 180 (H) 03/14/2022 0148   TRIG 186 11/29/2011 1326   HDL 28 (L) 03/14/2022 0148   HDL 29 (L) 11/29/2011 1326   CHOLHDL 7.2 03/14/2022 0148   VLDL 36 03/14/2022 0148   VLDL 37 11/29/2011 1326   LDLCALC 137 (H) 03/14/2022 0148   LDLCALC 214 (H) 11/29/2011 1326     Risk Assessment/Calculations:             Physical Exam:    VS:  BP 110/74 (BP  Location: Left Arm, Patient Position: Sitting, Cuff Size: Large)   Pulse 85   Ht  5\' 1"  (1.549 m)   Wt 179 lb 3.2 oz (81.3 kg)   SpO2 96%   BMI 33.86 kg/m     Wt Readings from Last 3 Encounters:  06/08/23 179 lb 3.2 oz (81.3 kg)  06/07/23 176 lb (79.8 kg)  06/06/23 170 lb (77.1 kg)     GEN:  Well nourished, well developed in no acute distress HEENT: Normal NECK: No JVD; No carotid bruits LYMPHATICS: No lymphadenopathy CARDIAC: RRR, no murmurs, rubs, gallops RESPIRATORY:  Clear to auscultation without rales, wheezing or rhonchi  ABDOMEN: Soft, non-tender, non-distended MUSCULOSKELETAL:  No edema; No deformity  SKIN: Warm and dry NEUROLOGIC:  Alert and oriented x 3 PSYCHIATRIC:  Normal affect   ASSESSMENT:    1. Coronary artery disease involving native coronary artery of native heart, unspecified whether angina present   2. Cardiomyopathy, unspecified type (HCC)   3. Smoking   4. Precordial pain    PLAN:    In order of problems listed above:  Chest pain, CAD s/p PCI x 2 to RCA.  Left heart cath 2024 patent RCA stents.  Nitrates caused headaches.  Start Ranexa 500 mg twice daily.  Continue aspirin, Repatha, Toprol-XL 50 mg daily.  Titrate Ranexa if chest pain symptoms persist. Cardiomyopathy EF 40 to 45%.  Continue Aldactone, Toprol-XL 50 mg daily.  Did not tolerate higher doses of GDMT.  Appears euvolemic. Current smoker, smoking cessation strongly advised.  Keep currently scheduled follow-up within a week.      Medication Adjustments/Labs and Tests Ordered: Current medicines are reviewed at length with the patient today.  Concerns regarding medicines are outlined above.  Orders Placed This Encounter  Procedures   EKG 12-Lead   Meds ordered this encounter  Medications   ranolazine (RANEXA) 500 MG 12 hr tablet    Sig: Take 1 tablet (500 mg total) by mouth 2 (two) times daily.    Dispense:  60 tablet    Refill:  3    Patient Instructions  Medication  Instructions:   START Ranexa - Take one tablet ( 500mg ) by mouth twice a day.   *If you need a refill on your cardiac medications before your next appointment, please call your pharmacy*   Lab Work:  None Ordered  If you have labs (blood work) drawn today and your tests are completely normal, you will receive your results only by: MyChart Message (if you have MyChart) OR A paper copy in the mail If you have any lab test that is abnormal or we need to change your treatment, we will call you to review the results.   Testing/Procedures:  None Ordered   Follow-Up: At [facility], you and your health needs are our priority.  As part of our continuing mission to provide you with exceptional heart care, we have created designated Provider Care Teams.  These Care Teams include your primary Cardiologist (physician) and Advanced Practice Providers (APPs -  Physician Assistants and Nurse Practitioners) who all work together to provide you with the care you need, when you need it.  We recommend signing up for the patient portal called "MyChart".  Sign up information is provided on this After Visit Summary.  MyChart is used to connect with patients for Virtual Visits (Telemedicine).  Patients are able to view lab/test results, encounter notes, upcoming appointments, etc.  Non-urgent messages can be sent to your provider as well.   To learn more about what you can do with MyChart, go to [email or web address].  Your next appointment:    As scheduled  ]   Signed, Debbe Odea, MD  06/08/2023 4:06 PM    [facility]

## 2023-06-08 NOTE — Telephone Encounter (Signed)
Patient called in with chest pain. She reports going to ED twice and they did not find anything wrong with her.   She considers this an emergency and wants to be seen. Advised at that time we did not have anything available until Monday.   She continued to review her chest pain radiates to her right side. She did have an audible cough and inquired if she had been coughing frequently as this can also cause some musculoskeletal pain. Requested for her to press on her chest to see if that elicited pain to increase and she denied and worsening discomfort.   Advised that we just did not have anything open today. She persisted and wanted to know what she would do over the weekend. Recommended ED visit but she has been twice and they found nothing. Checked schedule with DOD and found opening. Scheduled her to come in and she confirmed time. No further needs.

## 2023-06-08 NOTE — Patient Instructions (Signed)
Medication Instructions:   START Ranexa - Take one tablet ( 500mg ) by mouth twice a day.   *If you need a refill on your cardiac medications before your next appointment, please call your pharmacy*   Lab Work:  None Ordered  If you have labs (blood work) drawn today and your tests are completely normal, you will receive your results only by: MyChart Message (if you have MyChart) OR A paper copy in the mail If you have any lab test that is abnormal or we need to change your treatment, we will call you to review the results.   Testing/Procedures:  None Ordered   Follow-Up: At [facility], you and your health needs are our priority.  As part of our continuing mission to provide you with exceptional heart care, we have created designated Provider Care Teams.  These Care Teams include your primary Cardiologist (physician) and Advanced Practice Providers (APPs -  Physician Assistants and Nurse Practitioners) who all work together to provide you with the care you need, when you need it.  We recommend signing up for the patient portal called "MyChart".  Sign up information is provided on this After Visit Summary.  MyChart is used to connect with patients for Virtual Visits (Telemedicine).  Patients are able to view lab/test results, encounter notes, upcoming appointments, etc.  Non-urgent messages can be sent to your provider as well.   To learn more about what you can do with MyChart, go to [email or web address].    Your next appointment:    As scheduled  ]

## 2023-06-11 ENCOUNTER — Encounter: Payer: Self-pay | Admitting: Internal Medicine

## 2023-06-11 ENCOUNTER — Other Ambulatory Visit: Payer: Self-pay | Admitting: Internal Medicine

## 2023-06-11 DIAGNOSIS — N644 Mastodynia: Secondary | ICD-10-CM

## 2023-06-12 ENCOUNTER — Other Ambulatory Visit: Payer: Self-pay

## 2023-06-12 ENCOUNTER — Ambulatory Visit: Payer: 59 | Attending: Cardiology | Admitting: Cardiology

## 2023-06-12 ENCOUNTER — Encounter: Payer: Self-pay | Admitting: Cardiology

## 2023-06-12 VITALS — BP 110/74 | HR 85 | Ht 61.0 in | Wt 178.6 lb

## 2023-06-12 DIAGNOSIS — R072 Precordial pain: Secondary | ICD-10-CM

## 2023-06-12 DIAGNOSIS — I2510 Atherosclerotic heart disease of native coronary artery without angina pectoris: Secondary | ICD-10-CM

## 2023-06-12 DIAGNOSIS — Z720 Tobacco use: Secondary | ICD-10-CM

## 2023-06-12 DIAGNOSIS — I471 Supraventricular tachycardia, unspecified: Secondary | ICD-10-CM

## 2023-06-12 DIAGNOSIS — Z79899 Other long term (current) drug therapy: Secondary | ICD-10-CM | POA: Diagnosis not present

## 2023-06-12 DIAGNOSIS — I255 Ischemic cardiomyopathy: Secondary | ICD-10-CM

## 2023-06-12 DIAGNOSIS — I5022 Chronic systolic (congestive) heart failure: Secondary | ICD-10-CM

## 2023-06-12 DIAGNOSIS — E785 Hyperlipidemia, unspecified: Secondary | ICD-10-CM

## 2023-06-12 MED ORDER — RANOLAZINE ER 1000 MG PO TB12
1000.0000 mg | ORAL_TABLET | Freq: Two times a day (BID) | ORAL | 6 refills | Status: DC
  Filled 2023-06-12 (×2): qty 60, 30d supply, fill #0
  Filled 2023-06-14: qty 20, 10d supply, fill #0
  Filled 2023-06-15 – 2023-06-17 (×2): qty 60, 30d supply, fill #0

## 2023-06-12 MED ORDER — FUROSEMIDE 20 MG PO TABS
20.0000 mg | ORAL_TABLET | Freq: Every day | ORAL | 3 refills | Status: DC
  Filled 2023-06-12: qty 30, 30d supply, fill #0
  Filled 2023-08-24: qty 30, 30d supply, fill #1

## 2023-06-12 NOTE — Progress Notes (Signed)
 Cardiology Office Note:  .   Date:  06/12/2023  ID:  Lisa Davila, DOB 10/25/1975, MRN [id number] PCP: Sadie Manna, MD  [facility] Providers Cardiologist:  Deatrice Cage, MD    History of Present Illness: .   Lisa Davila is a 48 y.o. female with past medical history of coronary disease status post ST elevated myocardial infarction status post PCI/DES RCA (06/2018) status post mechanical thrombectomy and DES placement (03/2022) for late stent thrombosis, HFrEF, PSVT, status post prior EP study with no injectable arrhythmia, fibromyalgia, hypertension, hyperlipidemia, tobacco abuse, anxiety, hypokalemia, who presents today for follow-up of coronary artery disease and recurrent chest pain.   She originally presented in 06/2021 with inferior ST elevated myocardial infarction with emergent left heart cath revealing thrombotic subtotal occlusion of the proximal RCA. This was treated successfully with PCI/DES. There was mild nonobstructive disease affecting the left coronary arteries. Echo at that time revealed an EF of 50-55%. The Brilinta  was transitioned to clopidogrel  secondary to persistent dyspnea. Following the diagnosis of COVID, she had recurrent chest pain multiple emergency room visits following her MI. Repeat echo in 08/2021 showed EF of 35%. Due to her drop in EF and symptoms, MR/LHC was performed in 11/2021 which showed patent RCA stent and mild in-stent restenosis with no evidence of obstructive disease affecting the left coronary arteries. EF was 35%. RHC showed mildly to moderately elevated filling pressures, minimal pulmonary hypertension, normal cardiac output. She presented to the hospital on 03/2022 with a recurrent MI. Left heart catheterization 03/2022 showed severe single-vessel CAD with occlusion of the proximal/mid RCA with late stent thrombosis and heavy thrombus burden. Nonobstructive CAD was observed in the left coronary artery similar to catheter 11/2021. She  underwent successful mechanical thrombectomy and DES placement to the proximal/mid RCA. Echo in 03/2022 showed an EF of 35-40%, hypokinesis of the left ventricular lateral wall and inferior lateral wall, mild LVH, G1 DD trivial mitral regurgitation. She was seen 06/2018 for having been recently diagnosed with COVID and reported chest tightness similar to her prior angina. Given that she underwent left heart catheterization in 06/26/2022 that showed widely patent RCA stents with no significant restenosis with otherwise nonobstructive disease as outlined. LVEF 45-50% by visual estimate. Medical therapy was recommended along with titration of metoprolol  XL 20 mg daily for improvement in sinus tachycardia.   She called into the nurse triage line with complaints of intermittent chest discomfort.  She stated that it was not pain and occurred mainly at rest and sometimes when she ambulates.  She was also experiencing shortness of breath when she ambulates.  She was evaluated urgent care on 11/11 and prescribed furosemide  once daily for 3 days.  She stated that chest discomfort eases for few hours when she takes furosemide  and that will start back again.  She was very congested and had continuous coughing while on the phone.  She states she was going to the ED tonight for further evaluation when she got out of work.  She was advised to continue with emergency department evaluation of there was concern for failure if she had a bacterial or viral infection.   Was evaluated in the emergency department on 03/27/2023 with complaint of chest pain.  Previous evaluation revealed she was negative for COVID, RSV, and influenza.  Started on cefdinir  for presumed pneumonia.  Stated she had a stabbing tingling sensation to her lips with concern that she may have 5 mg to the cefdinir .  Take steroids daily today.  Worsening shortness of breath and chest pressure which brought her to the emergency department today.  Intermittently has  swelling to the left leg diagnosed with heart failure exacerbation given oral Lasix  in urgent care.  Called cardiology and told to come to the emergency department ongoing symptoms patient was found to be hypoxic on arrival with oxygen saturation in the 80s.  She was noted to be tachycardic with a heart rate of 102.  Pertinent labs revealed a potassium of 2.9, blood glucose 150, WBCs of 10.7.  High-sensitivity troponins were negative so there was low suspicion for ACS.  Clinical history is most consistent with asthma/COPD exacerbation with ongoing tobacco use.  Reevaluation she was feeling much better after DuoNeb treatments and IV Solu-Medrol  given in the emergency department.  Ambulatory oxygen saturation 99/100%.  Patient was feeling much better and she had a concern for cefdinir  allergy and she was switched to Augmentin .  It was advised to keep her outpatient follow-up appointment with cardiology.   Patient was last seen in clinic 06/08/2023 by Dr.Agbor-Etang.  At that time she continued to have right-sided chest pain that radiated to the breast and was recently evaluated by the emergency department with unremarkable workup.  She had not taken any nitro due to severe side effects of headaches.  She endorsed chest pain located in her mid chest radiating to her right breast.  Initially was seen in urgent care and she was told she may have developed shingles.  She does not get shingles.  She endorses some itchiness in her right back.  Symptoms of chest discomfort are not associated with exertion due to persistent symptoms she presented to the emergency department troponins were normal.  She was continued on aspirin , Repatha , Toprol -XL and started on Ranexa  500 mg twice daily.  She returns to clinic today with slight improvement noted in chest pain.  She continues to suffer from pain that is on the right side of her breast.  She said it is no longer substernal radiating to the right side of her chest that is just  at the right side of her breast.  With the continued discomfort she has scheduled a mammogram.  She has had several workups in the emergency department and that were unrevealing.  When she was last seen in clinic she was started on Ranexa  with the potential for up titration in the dose.  She is also concerned as she has issues with her right rotator cuff and is looking to get back in with orthopedics.  She also complains of abdominal swelling today without weight gain and is asking if she can be restarted on low-dose furosemide  to help with some of the swelling.  She continues to have chronic dyspnea that is stable and unchanged.  She states that she has been compliant with her current medication regimen without adverse side effects.  ROS: 10 point review of system has been reviewed and considered negative except what is been listed in HPI  Studies Reviewed: .        2D echo 03/16/2023 1. Left ventricular ejection fraction, by estimation, is 40 to 45%. Left  ventricular ejection fraction by PLAX is 44 %. The left ventricle has mild  to moderately decreased function. The left ventricle demonstrates global  hypokinesis. Left ventricular  diastolic parameters are consistent with Grade I diastolic dysfunction  (impaired relaxation).   2. Right ventricular systolic function is normal. The right ventricular  size is normal.   3. The mitral valve is normal  in structure. No evidence of mitral valve  regurgitation.   4. The aortic valve was not well visualized. Aortic valve regurgitation  is not visualized.    LHC 06/26/2022:   Mid LAD lesion is 30% stenosed.   2nd Diag lesion is 50% stenosed.   Mid RCA lesion is 30% stenosed.   Non-stenotic Prox RCA to Mid RCA lesion was previously treated.   There is mild left ventricular systolic dysfunction.   LV end diastolic pressure is normal.   The left ventricular ejection fraction is 45-50% by visual estimate.   1.  Widely patent RCA stents with no  significant restenosis. 2.  Mildly reduced LV systolic function with normal LVEDP.   Recommendations: Continue aggressive medical therapy. Increase Toprol  to 100 mg once daily for better control of sinus tachycardia.   LHC 03/14/2022: Conclusions: Severe single vessel CAD with occlusion of proximal/mid RCA with late stent thrombosis and heavy thrombus burden. Non-obstructive coronary artery disease observed in the left coronary artery, similar to prior cath from 11/2021. Mildly elevated left ventricular filling pressure (LVEDP 20 mmHg). Successful mechanical thrombectomy and drug-eluting stent placement to proximal/mid RCA using Onyx Frontier 3.0 x 26 mm drug-eluting stent (postdilated up to 4.0 mm) with 0% residual stenosis and TIMI-3 flow.   Recommendations: Tirofiban  infusion x 4 hours. Continue dual antiplatelet therapy with aspirin  and prasugrel  for at least 12 months.  Check CYP 2C19 genotype to assess clopidogrel  response. Follow-up echocardiogram. Aggressive secondary prevention of coronary artery disease and escalation of evidence-based heart failure therapy as blood pressure allows.   2D echo 03/14/2022: 1. Left ventricular ejection fraction, by estimation, is 35 to 40%. The  left ventricle has moderately decreased function. The left ventricle  demonstrates regional wall motion abnormalities (see scoring  diagram/findings for description). There is mild  left ventricular hypertrophy. Left ventricular diastolic parameters are  consistent with Grade I diastolic dysfunction (impaired relaxation). There  is severe hypokinesis of the left ventricular, entire lateral wall and  inferolateral wall.   2. Right ventricular systolic function is mildly reduced. The right  ventricular size is normal.   3. The mitral valve is normal in structure. Trivial mitral valve  regurgitation.   4. The aortic valve was not well visualized. Aortic valve regurgitation  is not visualized. No aortic  stenosis is present.    Zio patch 12/2021: Patient had a min HR of 59 bpm, max HR of 123 bpm, and avg HR of 89 bpm. Predominant underlying rhythm was Sinus Rhythm.  Rare PACs and rare PVCs. No significant arrhythmia overall.   R/LHC 11/07/2021:   Mid LAD lesion is 30% stenosed.   Prox RCA lesion is 20% stenosed.   2nd Diag lesion is 50% stenosed.   There is moderate left ventricular systolic dysfunction.   LV end diastolic pressure is mildly elevated.   1.  Patent proximal RCA stent with mild in-stent restenosis.  No obstructive disease affecting the left coronary artery system. 2.  Moderately reduced LV systolic function with an EF of 35% with global hypokinesis that is more prominent in the inferior apical area. 3.  Right heart catheterization showed mildly to moderately elevated filling pressures, minimal pulmonary hypertension and normal cardiac output.   Recommendations: Continue medical therapy for coronary artery disease. Optimize heart failure treatment.  The patient is volume overloaded.  I increased Lasix  to 40 mg once daily.  In addition, due to resting sinus tachycardia, increased Toprol  to 50 mg once daily. We will plan on adding an  ARB and SGLT2 inhibitor upon follow-up.   2D echo 07/04/2021: 1. Left ventricular ejection fraction, by estimation, is 50 to 55%. The  left ventricle has low normal function. The left ventricle has no regional  wall motion abnormalities. There is mild left ventricular hypertrophy.  Left ventricular diastolic  parameters are indeterminate.   2. Right ventricular systolic function is normal. The right ventricular  size is normal.   3. The mitral valve is normal in structure. Trivial mitral valve  regurgitation. No evidence of mitral stenosis.   4. The aortic valve is normal in structure. Aortic valve regurgitation is  not visualized. No aortic stenosis is present.   5. The inferior vena cava is normal in size with greater than 50%  respiratory  variability, suggesting right atrial pressure of 3 mmHg.    LHC 07/04/2021:   Prox RCA lesion is 99% stenosed.   Mid LAD lesion is 30% stenosed.   A drug-eluting stent was successfully placed using a STENT ONYX FRONTIER 4.0X22.   Post intervention, there is a 0% residual stenosis.   There is mild left ventricular systolic dysfunction.   LV end diastolic pressure is mildly elevated.   The left ventricular ejection fraction is 45-50% by visual estimate.   1.  Severe one-vessel coronary artery disease with thrombotic subtotal occlusion of the proximal right coronary artery which is the culprit for inferior ST elevation myocardial infarction.  Mild disease affecting the left coronary system. 2.  Mildly reduced LV systolic function and mildly elevated left ventricular end-diastolic pressure at 17 mmHg. 3.  Successful angioplasty and drug-eluting stent placement to the right coronary artery.   Recommendations: Given large thrombus burden, continue Aggrastat  infusion for 12 hours. Dual antiplatelet therapy is recommended for at least 12 months. Aggressive treatment of risk factors and smoking cessation. Risk Assessment/Calculations:             Physical Exam:   VS:  BP 110/74   Pulse 85   Ht 5' 1 (1.549 m)   Wt 178 lb 9.6 oz (81 kg)   SpO2 99%   BMI 33.75 kg/m    Wt Readings from Last 3 Encounters:  06/12/23 178 lb 9.6 oz (81 kg)  06/08/23 179 lb 3.2 oz (81.3 kg)  06/07/23 176 lb (79.8 kg)    GEN: Well nourished, well developed in no acute distress NECK: No JVD; No carotid bruits CARDIAC: RRR, no murmurs, rubs, gallops RESPIRATORY:  Clear to auscultation without rales, wheezing or rhonchi  ABDOMEN: Soft, non-tender, non-distended EXTREMITIES: Trace pretibial edema; No deformity   ASSESSMENT AND PLAN: .   Coronary artery disease native coronary artery with stable angina.  She continues to report right breast discomfort that is slightly improved since started on Ranexa  500 mg twice  daily.  A left heart catheterization was completed in 2/25 which demonstrated patent RCA stent otherwise nonobstructive disease involving the left coronary tree.  She is continued on aspirin  81 mg daily, Effient  10 mg daily, rosuvastatin  40 mg daily.  And with her continued chest discomfort with some mild improvement her Ranexa  has been increased from 500 mg twice daily to 1000 mg twice daily.  Chronic HFrEF with ischemic cardiomyopathy with last LVEF of 40 to 25% which is improved from prior studies 51 DD, no evidence of valvular abnormalities.  Echo is improved from prior studies.  Shortness of breath has been stable.  She is continued on spironolactone  25 mg daily, Toprol -XL 50 mg daily, and is being given back furosemide   20 mg daily as needed with a repeat BMP in 2 weeks.  Blood pressure continues to remain slightly soft which prohibits the addition of an escalation of GDMT previously she did not tolerate higher doses of GDMT..  Weight has been stable.  She has some trace pretibial edema noted on exam today but contains of abdominal bloating where she is at previously had been started on spironolactone .  Mixed hyperlipidemia with last LDL of blank she is continued on rosuvastatin  40 mg daily  History of PSVT which was quiescent on Toprol -XL 50 mg daily.  Tobacco abuse with total cessation continues to be recommended       Dispo: Patient return to clinic to see MD/APP in 4 to 6 weeks or sooner.  Patient prefers to see a primary cardiologist Dr. Darron on return and has been several months since she has seen him.  She is also requesting a referral to orthopedics Dr. Edie for reevaluation of her rotator cuff  Signed, Akeela Busk, NP

## 2023-06-12 NOTE — Patient Instructions (Signed)
 Medication Instructions:   Your physician has recommended you make the following change in your medication:    Increase Ranexa  1000 mg 1 tablet  by mouth twice daily  Start Furosemide  20 mg 1 tablet daily by mouth  *If you need a refill on your cardiac medications before your next appointment, please call your pharmacy*   Lab Work:   Your provider would like for you to return in 2 WEEKS  to have the following labs drawn: BMET.   Please go to [facility] : [facility] [address] ([facility] [address], [place] [address] You do not need an appointment.  They are open from 8 am- 4:30 pm.  Lunch from 1:00 pm- 2:00 pm You DO NOT need to be fasting.   You may also go to one of the following [facility]:  [address] [place], [place] [address] Phone: [phone] Lab hours: Mon-Fri 8 am- 5 pm    Lunch 12 pm- 1 pm  [address] [place],  [place]  [address]  [place]  Phone: [phone] Lab hours: 7 am- 4 pm Lunch 12 pm-1 pm   [address] [place],  [place]  [address]  [place]  Phone: [phone] Lab hours: Mon-Fri 8 am- 5 pm    Lunch 12 pm- 1 pm   If you have labs (blood work) drawn today and your tests are completely normal, you will receive your results only by: MyChart Message (if you have MyChart) OR A paper copy in the mail If you have any lab test that is abnormal or we need to change your treatment, we will call you to review the results.   Testing/Procedures:  No test ordered today    Follow-Up: At [facility], you and your health needs are our priority.  As part of our continuing mission to provide you with exceptional heart care, we have created designated Provider Care Teams.  These Care Teams include your primary Cardiologist (physician) and Advanced Practice Providers (APPs -  Physician Assistants and Nurse Practitioners) who all work together to provide you with the care you need, when you need it.  Your next appointment:   4-6   week(s)  Provider:   You may see Deatrice Cage, MD

## 2023-06-14 ENCOUNTER — Other Ambulatory Visit: Payer: Self-pay

## 2023-06-14 ENCOUNTER — Ambulatory Visit
Admission: RE | Admit: 2023-06-14 | Discharge: 2023-06-14 | Disposition: A | Discharge status: Home / Self Care | Payer: 59 | Source: Ambulatory Visit | Attending: Internal Medicine | Admitting: Internal Medicine

## 2023-06-14 DIAGNOSIS — N644 Mastodynia: Secondary | ICD-10-CM | POA: Diagnosis present

## 2023-06-14 MED ORDER — HYDROCODONE-ACETAMINOPHEN 5-325 MG PO TABS
ORAL_TABLET | Freq: Two times a day (BID) | ORAL | 0 refills | Status: AC | PRN
  Filled 2023-06-14: qty 20, 10d supply, fill #0

## 2023-06-15 ENCOUNTER — Other Ambulatory Visit: Payer: Self-pay

## 2023-06-15 ENCOUNTER — Other Ambulatory Visit (HOSPITAL_COMMUNITY): Payer: Self-pay

## 2023-06-15 ENCOUNTER — Telehealth: Payer: Self-pay

## 2023-06-15 MED ORDER — LIDOCAINE 5 % EX PTCH
1.0000 | MEDICATED_PATCH | Freq: Every day | CUTANEOUS | 0 refills | Status: AC
  Filled 2023-06-15: qty 30, 30d supply, fill #0

## 2023-06-15 MED ORDER — VALACYCLOVIR HCL 1 G PO TABS
1.0000 g | ORAL_TABLET | Freq: Three times a day (TID) | ORAL | 0 refills | Status: AC
  Filled 2023-06-15: qty 21, 7d supply, fill #0

## 2023-06-15 MED ORDER — TRIAMCINOLONE ACETONIDE 0.1 % EX CREA
TOPICAL_CREAM | Freq: Two times a day (BID) | CUTANEOUS | 0 refills | Status: AC
  Filled 2023-06-15: qty 30, 15d supply, fill #0

## 2023-06-15 MED ORDER — PREDNISONE 20 MG PO TABS
40.0000 mg | ORAL_TABLET | Freq: Every day | ORAL | 0 refills | Status: AC
  Filled 2023-06-15: qty 14, 7d supply, fill #0

## 2023-06-15 NOTE — Telephone Encounter (Signed)
 Pharmacy Patient Advocate Encounter  Received notification from [facility] that Prior Authorization for RANOLAZINE  has been APPROVED from 06/14/23 to 06/13/24

## 2023-06-15 NOTE — Telephone Encounter (Signed)
 Pharmacy Patient Advocate Encounter   Received notification from CoverMyMeds that prior authorization for RANOLAZINE  is required/requested.   Insurance verification completed.   The patient is insured through [facility] .   Per test claim: PA required; PA submitted to above mentioned insurance via CoverMyMeds Key/confirmation #/EOC [id number] Status is pending

## 2023-06-17 ENCOUNTER — Other Ambulatory Visit: Payer: Self-pay

## 2023-06-18 ENCOUNTER — Other Ambulatory Visit: Payer: Self-pay

## 2023-06-18 MED ORDER — GABAPENTIN 100 MG PO CAPS
100.0000 mg | ORAL_CAPSULE | Freq: Two times a day (BID) | ORAL | 1 refills | Status: AC
  Filled 2023-06-18: qty 60, 30d supply, fill #0

## 2023-06-18 MED ORDER — HYDROCORTISONE 2.5 % EX CREA
TOPICAL_CREAM | Freq: Two times a day (BID) | CUTANEOUS | 0 refills | Status: AC
Start: 2023-06-18 — End: ?
  Filled 2023-06-18: qty 30, 15d supply, fill #0

## 2023-06-18 MED FILL — Spironolactone Tab 25 MG: ORAL | 30 days supply | Qty: 30 | Fill #1 | Status: AC

## 2023-06-20 ENCOUNTER — Other Ambulatory Visit: Payer: Self-pay

## 2023-06-20 MED ORDER — ALPRAZOLAM 0.5 MG PO TABS
0.5000 mg | ORAL_TABLET | Freq: Three times a day (TID) | ORAL | 0 refills | Status: AC
  Filled 2023-06-20 – 2023-07-20 (×2): qty 90, 30d supply, fill #0

## 2023-06-20 MED ORDER — GABAPENTIN 100 MG PO CAPS
100.0000 mg | ORAL_CAPSULE | Freq: Two times a day (BID) | ORAL | 1 refills | Status: AC
  Filled 2023-06-20: qty 60, 30d supply, fill #0

## 2023-06-21 ENCOUNTER — Other Ambulatory Visit: Payer: Self-pay

## 2023-06-26 ENCOUNTER — Other Ambulatory Visit: Payer: 59

## 2023-07-03 ENCOUNTER — Other Ambulatory Visit: Payer: Self-pay

## 2023-07-03 IMAGING — CR DG CHEST 2V
2 series · 2 of 2 positions shown · non-contrast
Comparison: none

CLINICAL DATA: Pt presents to ED from working on 2A with sudden
onset of CP which pt states started while sitting at her desk and
states this happened about 40 minutes PTA. Pt states she was
initially at [DATE]

EXAM:
CHEST - 2 VIEW

[chest pa]
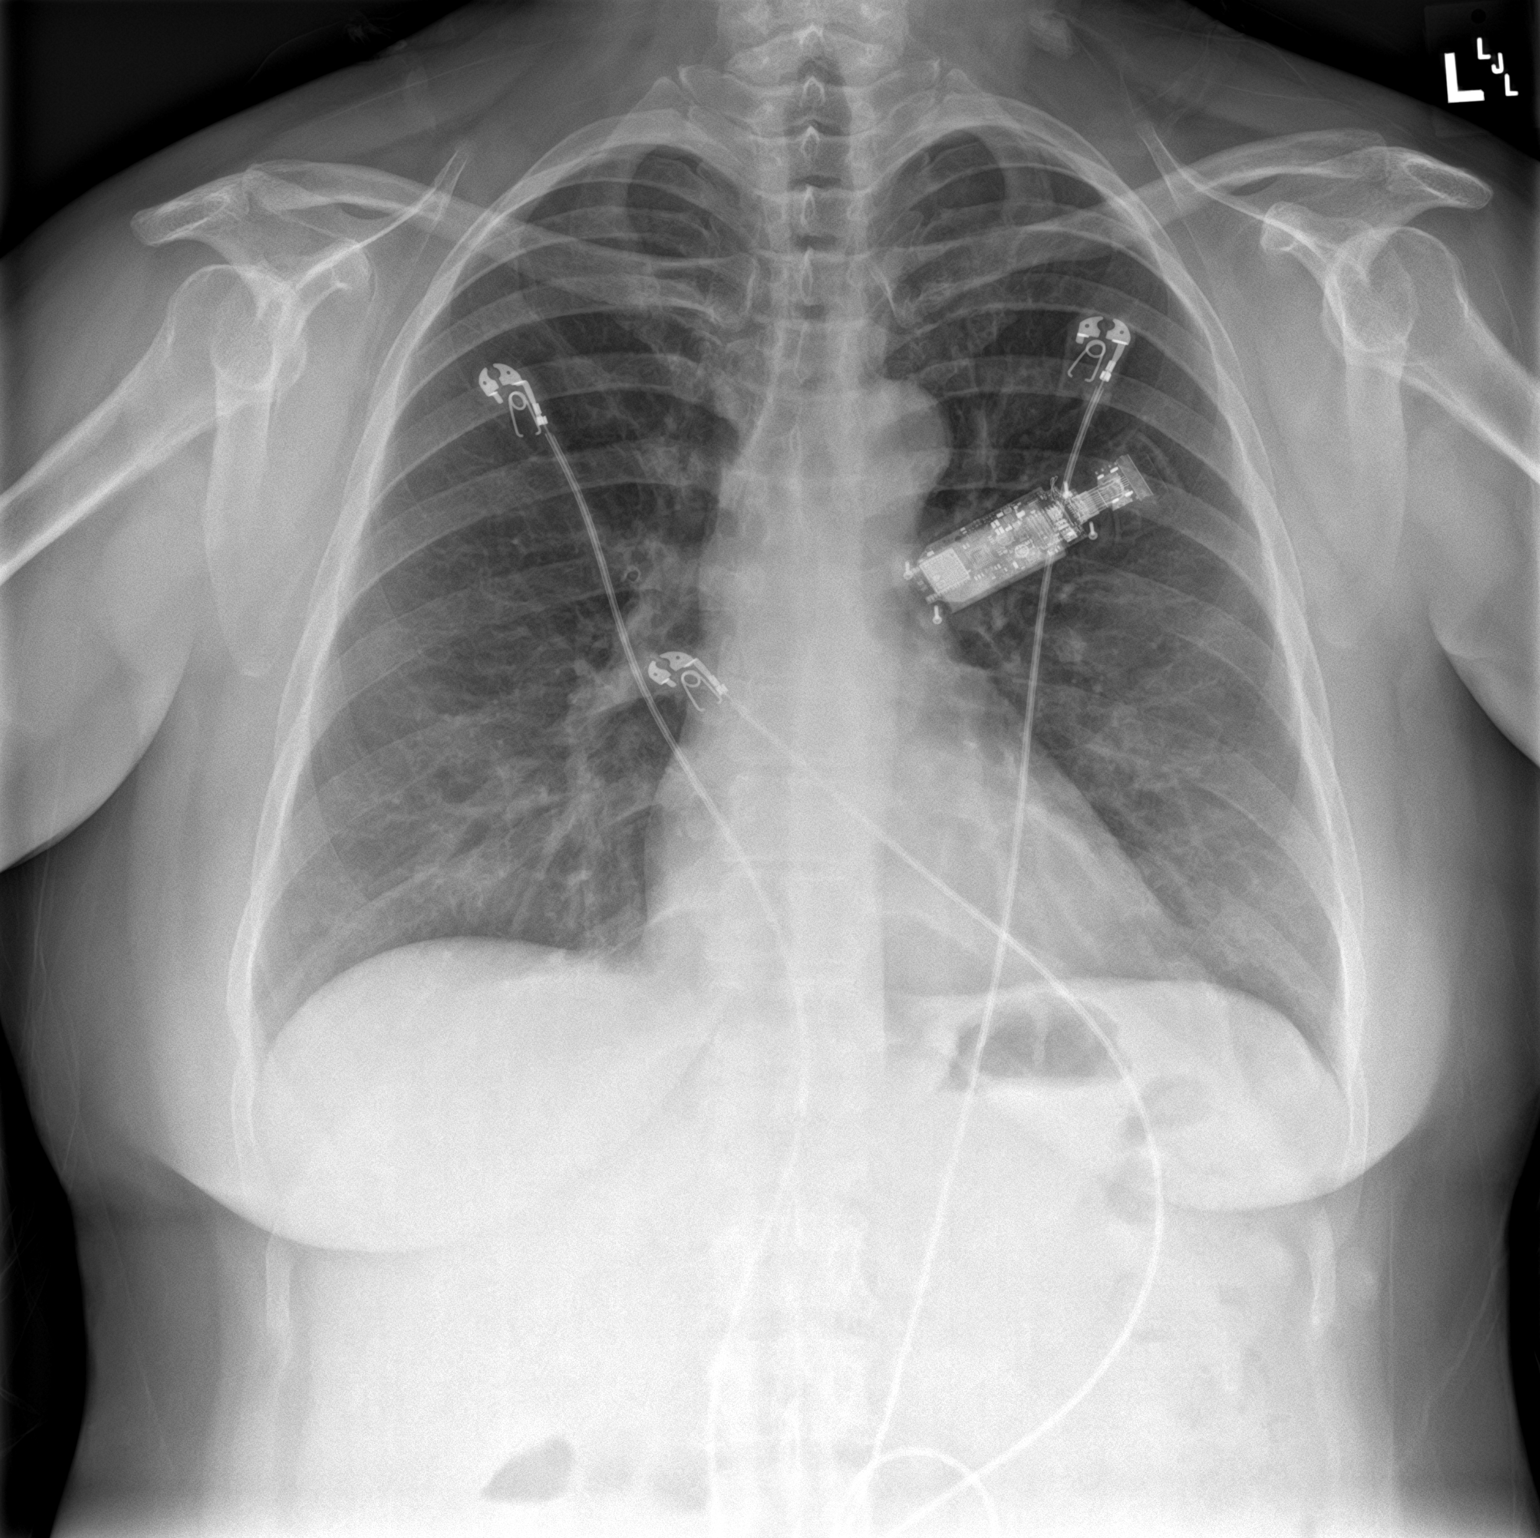

[chest lat]
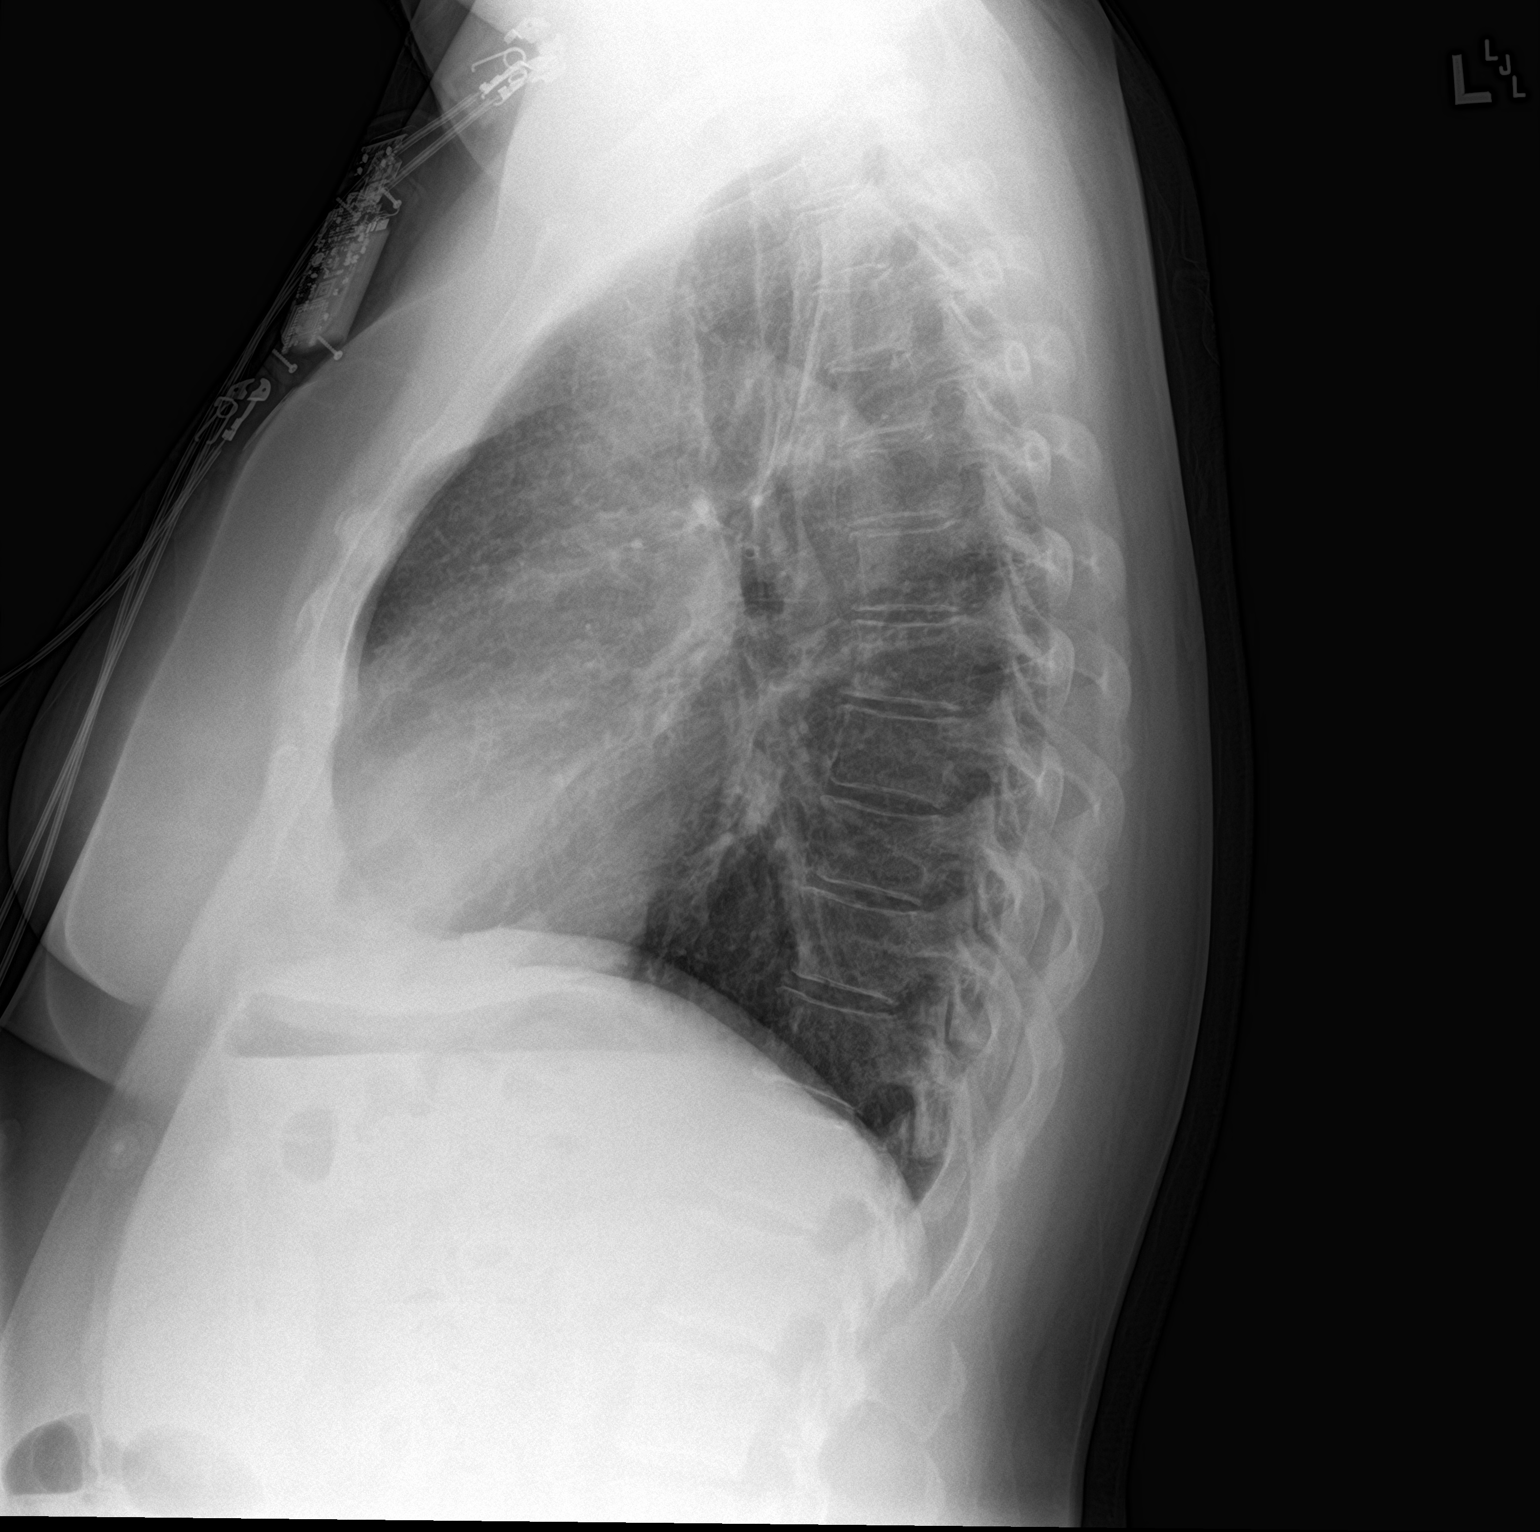

[2 of 2 positions shown; findings below may reference images not displayed]

FINDINGS: Lungs are clear. Electronic monitor projects over the mid left
chest.

Heart size and mediastinal contours are within normal limits.

No effusion.

Visualized bones unremarkable.
IMPRESSION: No acute cardiopulmonary disease.

## 2023-07-04 ENCOUNTER — Other Ambulatory Visit: Payer: Self-pay

## 2023-07-20 ENCOUNTER — Other Ambulatory Visit: Payer: Self-pay

## 2023-07-23 ENCOUNTER — Other Ambulatory Visit: Payer: Self-pay

## 2023-07-23 MED ORDER — AMOXICILLIN 875 MG PO TABS
875.0000 mg | ORAL_TABLET | Freq: Two times a day (BID) | ORAL | 0 refills | Status: AC
  Filled 2023-07-23: qty 14, 7d supply, fill #0

## 2023-07-23 MED ORDER — NICOTINE 14 MG/24HR TD PT24: MEDICATED_PATCH | TRANSDERMAL | 0 refills | Status: AC

## 2023-07-23 MED ORDER — NICOTINE 14 MG/24HR TD PT24
MEDICATED_PATCH | TRANSDERMAL | 0 refills | Status: DC
Start: 2023-07-23 — End: 2023-09-19
  Filled 2023-07-23: qty 28, 28d supply, fill #0

## 2023-07-23 MED ORDER — FLUCONAZOLE 100 MG PO TABS
100.0000 mg | ORAL_TABLET | Freq: Every day | ORAL | 0 refills | Status: AC
  Filled 2023-07-23: qty 5, 5d supply, fill #0

## 2023-07-23 MED ORDER — MOMETASONE FUROATE 50 MCG/ACT NA SUSP
2.0000 | Freq: Every day | NASAL | 11 refills | Status: AC
  Filled 2023-07-23: qty 17, 30d supply, fill #0

## 2023-07-23 MED ORDER — PREDNISONE 10 MG PO TABS
ORAL_TABLET | ORAL | 0 refills | Status: AC
  Filled 2023-07-23: qty 18, 9d supply, fill #0

## 2023-07-24 ENCOUNTER — Ambulatory Visit: Payer: 59 | Attending: Cardiovascular Disease | Admitting: Cardiovascular Disease

## 2023-07-24 ENCOUNTER — Other Ambulatory Visit: Payer: Self-pay

## 2023-07-24 ENCOUNTER — Encounter: Payer: Self-pay | Admitting: Cardiovascular Disease

## 2023-07-24 VITALS — BP 102/68 | HR 85 | Ht 61.0 in | Wt 176.0 lb

## 2023-07-24 DIAGNOSIS — I25118 Atherosclerotic heart disease of native coronary artery with other forms of angina pectoris: Secondary | ICD-10-CM

## 2023-07-24 DIAGNOSIS — I5022 Chronic systolic (congestive) heart failure: Secondary | ICD-10-CM

## 2023-07-24 DIAGNOSIS — E785 Hyperlipidemia, unspecified: Secondary | ICD-10-CM

## 2023-07-24 DIAGNOSIS — I471 Supraventricular tachycardia, unspecified: Secondary | ICD-10-CM | POA: Diagnosis not present

## 2023-07-24 DIAGNOSIS — Z720 Tobacco use: Secondary | ICD-10-CM

## 2023-07-24 MED ORDER — NICOTINE 21 MG/24HR TD PT24
21.0000 mg | MEDICATED_PATCH | Freq: Every day | TRANSDERMAL | 0 refills | Status: AC
  Filled 2023-07-24 – 2023-08-24 (×2): qty 28, 28d supply, fill #0

## 2023-07-24 NOTE — Progress Notes (Unsigned)
 Cardiology Office Note   Date:  07/24/2023   ID:  Lisa Davila, DOB 1975-09-25, MRN [id number]  PCP:  Barbette Reichmann, MD  Cardiologist:   Lorine Bears, MD   Chief Complaint  Patient presents with   Follow-up    4-6 week follow up visit. Patient is doing well on today. Meds reviewed.       History of Present Illness: Lisa Davila is a 48 y.o. female who is here today for a follow-up visit regarding coronary artery disease and chronic systolic heart failure.  She has known history of SVT status post previous EP study with no inducible arrhythmia, essential hypertension, hyperlipidemia, fibromyalgia, tobacco use, anxiety and hypokalemia. She presented in February 2023 with chest pain and was found to have inferior ST elevation.  Emergent cardiac catheterization showed thrombotic subtotal occlusion of the proximal right coronary artery which was treated successfully with PCI and drug-eluting stent placement.  There was mild nonobstructive disease affecting the left coronary arteries.  Echocardiogram showed an EF of 50 to 55%.  Brilinta was switched to clopidogrel due to persistent dyspnea.  She had recurrent chest pain after that we will multiple emergency room visits. Repeat echocardiogram in April of 2023 showed an EF of 35%.  Due to a drop in her ejection fraction and her symptoms, right and left cardiac catheterization was done in July, 2023 which showed patent RCA stent with mild in-stent restenosis and no evidence of obstructive disease affecting the left coronary arteries.  Ejection fraction was 35%.  Right heart catheterization showed mildly to moderately elevated filling pressures, minimal pulmonary hypertension and normal cardiac output.   She presented in November, 2023 with recurrent myocardial infarction.  Cardiac catheterization showed occluded RCA stent due to late stent thrombosis.  She underwent successful mechanical thrombectomy and drug eluting stent  placement.  She was switched from clopidogrel to prasugrel.  Genotype testing subsequently showed intermediate 2C-19 metabolic activity. Echocardiogram showed an EF of 35 to 40%.  She quit smoking since her second myocardial infarction.    She had recurrent chest pain in February of 2024 and underwent repeat left heart catheterization which showed widely patent RCA stents with no significant restenosis, mildly reduced LV systolic function with an EF of 45 to 50% with normal left ventricular end-diastolic pressure.  Most recent echocardiogram in November 2024 showed an EF of 40 to 45%.  She has been doing well overall with no recent chest pain.  She has chronic exertional dyspnea which has been stable.  She continues to be frustrated with her weight. She was seen for chest pain in February but was subsequently diagnosed with shingles.   Past Medical History:  Diagnosis Date   Acute ethmoidal sinusitis    Acute ethmoidal sinusitis    Acute maxillary sinusitis    Acute upper respiratory infections of unspecified site    Anxiety state, unspecified    Chest pain, unspecified    CHF (congestive heart failure) (HCC)    Coronary artery disease    Depression    Diarrhea    Esophageal reflux    Generalized anxiety disorder    Hyperlipidemia    Hypertension    Hypoglycemia, unspecified    Lumbago    Nausea with vomiting    Obstructive chronic bronchitis with exacerbation (HCC)    Other and unspecified hyperlipidemia    Other and unspecified peripheral vertigo(386.19)    Other bursitis disorders    Other malaise and fatigue    Other specified  diseases due to viruses    Other vitamin B12 deficiency anemia    Pain in limb    Pernicious anemia    Sleep related hypoventilation/hypoxemia in conditions classifiable elsewhere    SVT (supraventricular tachycardia) (HCC)    Swelling, mass, or lump in head and neck    Tobacco use disorder    Unspecified disorder of external ear    Unspecified  disorder of skin and subcutaneous tissue    Unspecified otitis media     Past Surgical History:  Procedure Laterality Date   CESAREAN SECTION     CORONARY STENT INTERVENTION N/A 03/14/2022   Procedure: CORONARY STENT INTERVENTION;  Surgeon: Yvonne Kendall, MD;  Location: [facility];  Service: Cardiovascular;  Laterality: N/A;   CORONARY THROMBECTOMY N/A 03/14/2022   Procedure: Coronary Thrombectomy;  Surgeon: Yvonne Kendall, MD;  Location: [facility];  Service: Cardiovascular;  Laterality: N/A;   CORONARY/GRAFT ACUTE MI REVASCULARIZATION N/A 07/04/2021   Procedure: Coronary/Graft Acute MI Revascularization;  Surgeon: Iran Ouch, MD;  Location: [facility];  Service: Cardiovascular;  Laterality: N/A;   LEFT HEART CATH AND CORONARY ANGIOGRAPHY N/A 07/04/2021   Procedure: LEFT HEART CATH AND CORONARY ANGIOGRAPHY;  Surgeon: Iran Ouch, MD;  Location: [facility];  Service: Cardiovascular;  Laterality: N/A;   LEFT HEART CATH AND CORONARY ANGIOGRAPHY N/A 03/14/2022   Procedure: LEFT HEART CATH AND CORONARY ANGIOGRAPHY;  Surgeon: Yvonne Kendall, MD;  Location: [facility];  Service: Cardiovascular;  Laterality: N/A;   LEFT HEART CATH AND CORONARY ANGIOGRAPHY N/A 06/26/2022   Procedure: LEFT HEART CATH AND CORONARY ANGIOGRAPHY;  Surgeon: Iran Ouch, MD;  Location: [facility];  Service: Cardiovascular;  Laterality: N/A;   RIGHT/LEFT HEART CATH AND CORONARY ANGIOGRAPHY N/A 11/07/2021   Procedure: RIGHT/LEFT HEART CATH AND CORONARY ANGIOGRAPHY;  Surgeon: Iran Ouch, MD;  Location: [facility];  Service: Cardiovascular;  Laterality: N/A;     Current Outpatient Medications  Medication Sig Dispense Refill   albuterol (VENTOLIN HFA) 108 (90 Base) MCG/ACT inhaler Inhale 2 puffs into the lungs every 6 (six) hours as needed for wheezing or shortness of breath. 8 g 2   ALPRAZolam (XANAX) 0.5 MG tablet Take 1 tablet  (0.5 mg total) by mouth 3 (three) times daily as needed for sleep 90 tablet 0   amoxicillin (AMOXIL) 875 MG tablet Take 1 tablet (875 mg total) by mouth every 12 (twelve) hours for 7 days. 14 tablet 0   aspirin 81 MG chewable tablet Chew 1 tablet (81 mg total) by mouth daily.     cefUROXime (CEFTIN) 250 MG tablet Take 1 tablet (250 mg total) by mouth 2 (two) times daily for 10 days. 20 tablet 0   chlorpheniramine-HYDROcodone (TUSSIONEX) 10-8 MG/5ML Take 5 mLs by mouth every 12 (twelve) hours as needed for up to 10 days 115 mL 0   ciprofloxacin (CIPRO) 500 MG tablet Take 500 mg by mouth every 12 (twelve) hours.     cyanocobalamin (VITAMIN B12) 1000 MCG tablet Take 1 tablet (1,000 mcg total) by mouth once daily 90 tablet 1   cyanocobalamin 1000 MCG tablet Take 1 tablet (1,000 mcg total) by mouth daily. 90 tablet 1   DULoxetine (CYMBALTA) 30 MG capsule Take 1 capsule (30 mg total) by mouth daily. 90 capsule 1   Evolocumab (REPATHA SURECLICK) 140 MG/ML SOAJ Inject 140 mg into the skin every 14 (fourteen) days. 2 mL 2   fluconazole (  DIFLUCAN) 100 MG tablet Take 1 tablet (100 mg total) by mouth daily for 5 days. 5 tablet 0   furosemide (LASIX) 20 MG tablet Take 1 tablet (20 mg total) by mouth daily. 30 tablet 3   gabapentin (NEURONTIN) 100 MG capsule Take 1 capsule (100 mg total) by mouth 2 (two) times daily. 60 capsule 1   gabapentin (NEURONTIN) 100 MG capsule Take 1 capsule (100 mg total) by mouth 2 (two) times daily. 60 capsule 1   HYDROcodone-acetaminophen (NORCO/VICODIN) 5-325 MG tablet Take 1 tablet by mouth 2 (two) times daily as needed for pain for up to 10 days. 20 tablet 0   hydrocortisone 2.5 % cream Apply topically 2 (two) times daily 30 g 0   ipratropium-albuterol (DUONEB) 0.5-2.5 (3) MG/3ML SOLN Take 3 mls by nebulization 4 (four) times daily for 30 days 360 mL 1   lidocaine (LIDODERM) 5 % Place 1 patch onto the skin daily. Apply patch to the most painful area for up to 12 hours in a 24 hour  period. 30 patch 0   metoprolol succinate (TOPROL-XL) 25 MG 24 hr tablet Take 2 tablets (50 mg total) by mouth once daily 60 tablet 11   metoprolol succinate (TOPROL-XL) 50 MG 24 hr tablet Take 1 tablet (50 mg total) by mouth daily. Overdue follow up.  PLEASE CALL OFFICE TO SCHEDULE APPOINTMENT PRIOR TO NEXT REFILL 90 tablet 3   mometasone (NASONEX) 50 MCG/ACT nasal spray Place 2 sprays into the nose daily. 17 g 11   nicotine (NICODERM CQ - DOSED IN MG/24 HOURS) 14 mg/24hr patch Place 1 patch (14 mg total) onto the skin daily. 30 patch 0   nitroGLYCERIN (NITROSTAT) 0.4 MG SL tablet Place 1 tablet (0.4 mg total) under the tongue every 5 (five) minutes as needed for chest pain. 50 tablet 0   ofloxacin (OCUFLOX) 0.3 % ophthalmic solution Place 1 drop into the left eye 4 (four) times daily. 5 mL 0   potassium chloride (KLOR-CON) 8 MEQ tablet Take 8 tablets (64 mEq total) by mouth daily. 720 tablet 3   Potassium Chloride 40 MEQ/15ML (20%) SOLN Take 60 mEq (22.5 ml) by mouth daily. 946 mL 0   prasugrel (EFFIENT) 10 MG TABS tablet Take 1 tablet (10 mg total) by mouth daily. 90 tablet 3   predniSONE (DELTASONE) 10 MG tablet Take 3 tablets (30 mg total) by mouth daily for 3 days, THEN 2 tablets (20 mg total) daily for 3 days, THEN 1 tablet (10 mg total) daily for 3 days. 18 tablet 0   ranolazine (RANEXA) 1000 MG SR tablet Take 1 tablet (1,000 mg total) by mouth 2 (two) times daily. 60 tablet 6   rosuvastatin (CRESTOR) 40 MG tablet Take 1 tablet (40 mg total) by mouth daily. 90 tablet 3   spironolactone (ALDACTONE) 25 MG tablet Take 1 tablet (25 mg total) by mouth daily. Please call the office to schedule follow up visit. 90 tablet 0   triamcinolone cream (KENALOG) 0.1 % Apply topically 2 (two) times daily 30 g 0   valACYclovir (VALTREX) 1000 MG tablet Take 1 tablet (1,000 mg total) by mouth 3 (three) times daily for 7 days. 21 tablet 0   albuterol (VENTOLIN HFA) 108 (90 Base) MCG/ACT inhaler Inhale 2 puffs  into the lungs every 6 (six) hours as needed. (Patient not taking: Reported on 07/24/2023) 18 g 5   ALPRAZolam (XANAX) 0.5 MG tablet Take 1 tablet (0.5 mg total) by mouth 3 (three) times daily as  needed for Sleep (Patient not taking: Reported on 02/13/2023) 90 tablet 2   ALPRAZolam (XANAX) 0.5 MG tablet Take 1 tablet (0.5 mg total) by mouth 3 (three) times daily as needed for sleep. (Patient not taking: Reported on 07/24/2023) 90 tablet 2   ALPRAZolam (XANAX) 0.5 MG tablet Take 1 tablet (0.5 mg total) by mouth 3 (three) times daily as needed. (Patient not taking: Reported on 07/24/2023) 90 tablet 0   nicotine (NICODERM CQ - DOSED IN MG/24 HOURS) 14 mg/24hr patch Place 1 patch onto the skin once daily (Patient not taking: Reported on 07/24/2023) 28 patch 0   nicotine (NICODERM CQ - DOSED IN MG/24 HOURS) 14 mg/24hr patch Place 1 patch onto the skin daily for 30 days (Patient not taking: Reported on 07/24/2023) 30 patch 0   predniSONE (DELTASONE) 20 MG tablet Take 2 tablets (40 mg total) by mouth daily for 7 days. (Patient not taking: Reported on 07/24/2023) 14 tablet 0   valACYclovir (VALTREX) 1000 MG tablet Take 1 tablet (1,000 mg total) by mouth 3 (three) times daily for 7 days. (Patient not taking: Reported on 07/24/2023) 21 tablet 0   VITAMIN D PO Take by mouth daily at 12 noon. (Patient not taking: Reported on 07/24/2023)     No current facility-administered medications for this visit.    Allergies:   Buspirone, Citalopram, Metrizamide, Sulfa antibiotics, Ceftin [cefuroxime], Contrast media [iodinated contrast media], Sulfa drugs cross reactors, and Sulfasalazine    Social History:  The patient  reports that she has been smoking cigarettes. She started smoking about 31 years ago. She has never used smokeless tobacco. She reports that she does not drink alcohol and does not use drugs.   Family History:  The patient's family history includes Breast cancer in her maternal grandmother; Cancer in her father  and paternal grandfather; Depression in her mother; Hypertension in her sister.    ROS:  Please see the history of present illness.   Otherwise, review of systems are positive for none.   All other systems are reviewed and negative.    PHYSICAL EXAM: VS:  BP 102/68   Pulse 85   Ht 5\' 1"  (1.549 m)   Wt 176 lb (79.8 kg)   SpO2 98%   BMI 33.25 kg/m  , BMI Body mass index is 33.25 kg/m. GEN: Well nourished, well developed, in no acute distress  HEENT: normal  Neck: no JVD, carotid bruits, or masses Cardiac: RRR; no murmurs, rubs, or gallops,no edema  Respiratory:  clear to auscultation bilaterally, normal work of breathing GI: soft, nontender, nondistended, + BS MS: no deformity or atrophy  Skin: warm and dry, no rash Neuro:  Strength and sensation are intact Psych: euthymic mood, full affect Radial pulses normal bilaterally.   EKG:  EKG is not ordered today.   Recent Labs: 03/21/2023: B Natriuretic Peptide 76.1; Magnesium 2.1 06/07/2023: ALT 17; BUN <5; Creatinine, Ser 0.76; Hemoglobin 14.9; Platelets 293; Potassium 4.0; Sodium 141    Lipid Panel    Component Value Date/Time   CHOL 201 (H) 03/14/2022 0148   CHOL 280 (H) 11/29/2011 1326   TRIG 180 (H) 03/14/2022 0148   TRIG 186 11/29/2011 1326   HDL 28 (L) 03/14/2022 0148   HDL 29 (L) 11/29/2011 1326   CHOLHDL 7.2 03/14/2022 0148   VLDL 36 03/14/2022 0148   VLDL 37 11/29/2011 1326   LDLCALC 137 (H) 03/14/2022 0148   LDLCALC 214 (H) 11/29/2011 1326      Wt Readings from Last  3 Encounters:  07/24/23 176 lb (79.8 kg)  06/12/23 178 lb 9.6 oz (81 kg)  06/08/23 179 lb 3.2 oz (81.3 kg)          10/26/2021    3:07 PM  PAD Screen  Previous PAD dx? No  Previous surgical procedure? No  Pain with walking? No  Feet/toe relief with dangling? No  Painful, non-healing ulcers? No  Extremities discolored? No      ASSESSMENT AND PLAN:  1.  Coronary artery disease involving native coronary arteries with other forms  of angina: Most recent cardiac catheterization in February 2024 showed patent RCA stents with no other obstructive disease.  Her symptoms are well-controlled now with antianginal medications.  Recent chest pain in February was due to shingles.  2.  Chronic systolic heart failure: Most recent ejection fraction was 45 %.  Continue Toprol and spironolactone.  She appears to be euvolemic on furosemide.  3.  Paroxysmal supraventricular tachycardia: Well-controlled with Toprol 50 mg daily.  4.  Hyperlipidemia: Continue rosuvastatin 40 mg daily.  She had recent lipid profile which showed an LDL of 64.   5.  Tobacco use: She quit smoking but then relapsed.  She has nicotine patches at home but not the 21 mg dose.  This was provided today.  6.  Obesity: BMI of 33.  Given her cardiovascular history, she should be a good candidate for [facility] and I asked her to discuss this with her primary care physician.  Coverage might be an issue.     Disposition: Follow-up in 6 months.  Signed,  Lorine Bears, MD  07/24/2023 3:53 PM    [facility]

## 2023-07-24 NOTE — Patient Instructions (Signed)
 Medication Instructions:  NICOTINE Patch : 1 patch daily for two weeks  *If you need a refill on your cardiac medications before your next appointment, please call your pharmacy*   Lab Work: None ordered If you have labs (blood work) drawn today and your tests are completely normal, you will receive your results only by: MyChart Message (if you have MyChart) OR A paper copy in the mail If you have any lab test that is abnormal or we need to change your treatment, we will call you to review the results.   Testing/Procedures: None ordered   Follow-Up: At [facility], you and your health needs are our priority.  As part of our continuing mission to provide you with exceptional heart care, we have created designated Provider Care Teams.  These Care Teams include your primary Cardiologist (physician) and Advanced Practice Providers (APPs -  Physician Assistants and Nurse Practitioners) who all work together to provide you with the care you need, when you need it.  We recommend signing up for the patient portal called "MyChart".  Sign up information is provided on this After Visit Summary.  MyChart is used to connect with patients for Virtual Visits (Telemedicine).  Patients are able to view lab/test results, encounter notes, upcoming appointments, etc.  Non-urgent messages can be sent to your provider as well.   To learn more about what you can do with MyChart, go to [email or web address].    Your next appointment:   6 month(s)  Provider:   You may see Lorine Bears, MD or one of the following Advanced Practice Providers on your designated Care Team:   Nicolasa Ducking, NP Eula Listen, PA-C Cadence Fransico Michael, PA-C Charlsie Quest, NP Carlos Levering, NP

## 2023-07-27 ENCOUNTER — Other Ambulatory Visit: Payer: Self-pay

## 2023-07-27 MED ORDER — DULOXETINE HCL 30 MG PO CPEP
30.0000 mg | ORAL_CAPSULE | Freq: Every day | ORAL | 1 refills | Status: AC
  Filled 2023-07-27: qty 30, 30d supply, fill #0

## 2023-07-27 MED ORDER — MOMETASONE FUROATE 50 MCG/ACT NA SUSP
2.0000 | Freq: Every day | NASAL | 11 refills | Status: AC
  Filled 2023-07-27: qty 17, 30d supply, fill #0

## 2023-07-27 MED ORDER — ALPRAZOLAM 0.5 MG PO TABS
0.5000 mg | ORAL_TABLET | Freq: Three times a day (TID) | ORAL | 0 refills | Status: AC | PRN
  Filled 2023-08-24 – 2023-09-11 (×3): qty 90, 30d supply, fill #0

## 2023-07-27 MED ORDER — WEGOVY 0.25 MG/0.5ML ~~LOC~~ SOAJ
0.2500 mg | SUBCUTANEOUS | 3 refills | Status: AC
  Filled 2023-07-27 – 2023-07-31 (×3): qty 2, 28d supply, fill #0

## 2023-07-30 ENCOUNTER — Other Ambulatory Visit: Payer: Self-pay

## 2023-07-31 ENCOUNTER — Other Ambulatory Visit: Payer: Self-pay

## 2023-08-01 ENCOUNTER — Other Ambulatory Visit: Payer: Self-pay

## 2023-08-06 ENCOUNTER — Other Ambulatory Visit: Payer: Self-pay

## 2023-08-08 ENCOUNTER — Other Ambulatory Visit: Payer: Self-pay

## 2023-08-17 ENCOUNTER — Encounter: Payer: Self-pay | Admitting: Cardiovascular Disease

## 2023-08-17 ENCOUNTER — Other Ambulatory Visit: Payer: Self-pay

## 2023-08-20 ENCOUNTER — Encounter: Payer: Self-pay | Admitting: Cardiovascular Disease

## 2023-08-20 IMAGING — DX DG CHEST 1V
1 series · 1 of 1 positions shown · non-contrast
Comparison: 08/18/2021

CLINICAL DATA: Left chest pain

EXAM:
CHEST  1 VIEW

[chest ap]
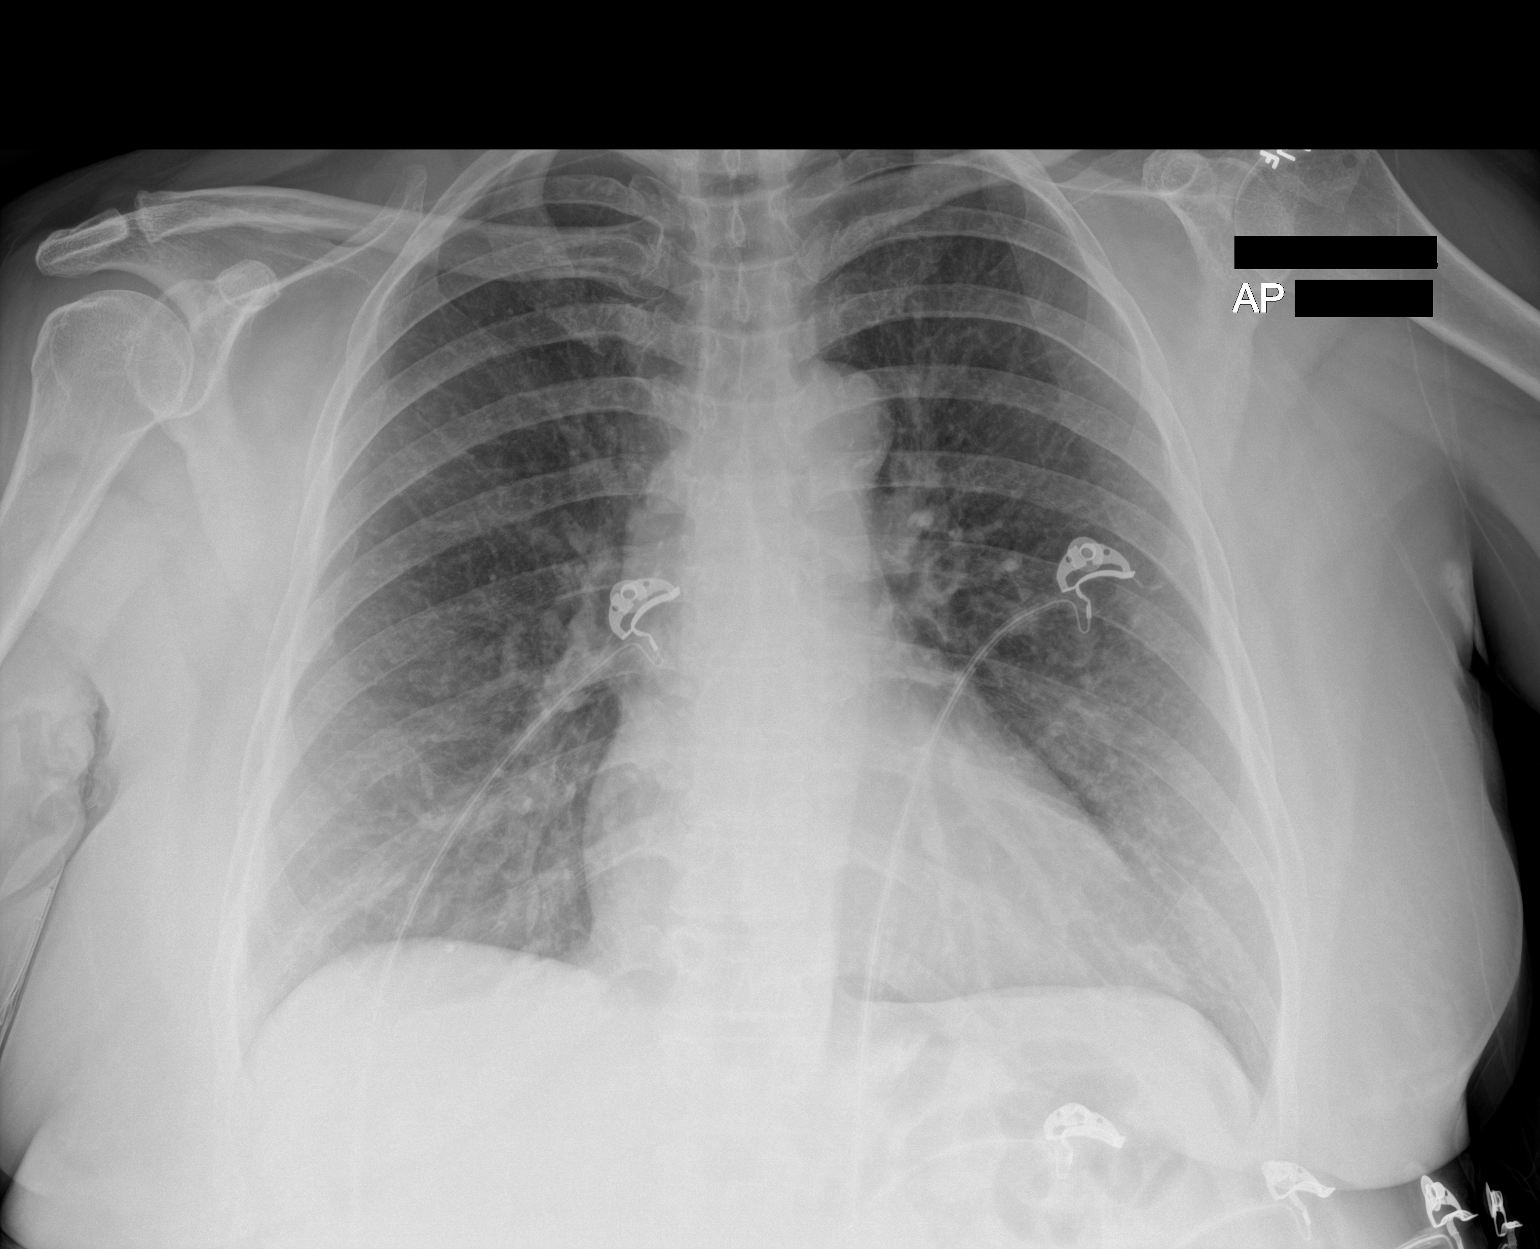

[1 of 1 positions shown; findings below may reference images not displayed]

FINDINGS: Heart and mediastinal contours are within normal limits. No focal
opacities or effusions. No acute bony abnormality.
IMPRESSION: No active disease.

## 2023-08-20 NOTE — Telephone Encounter (Signed)
 duplicate

## 2023-08-24 ENCOUNTER — Other Ambulatory Visit: Payer: Self-pay

## 2023-08-24 ENCOUNTER — Encounter: Payer: Self-pay | Admitting: *Deleted

## 2023-08-27 ENCOUNTER — Other Ambulatory Visit: Payer: Self-pay

## 2023-08-27 ENCOUNTER — Encounter: Payer: Self-pay | Admitting: *Deleted

## 2023-08-27 MED ORDER — ALBUTEROL SULFATE HFA 108 (90 BASE) MCG/ACT IN AERS
2.0000 | INHALATION_SPRAY | Freq: Four times a day (QID) | RESPIRATORY_TRACT | 5 refills | Status: AC | PRN
  Filled 2023-08-27: qty 18, 25d supply, fill #0

## 2023-09-04 ENCOUNTER — Other Ambulatory Visit: Payer: Self-pay

## 2023-09-11 ENCOUNTER — Other Ambulatory Visit: Payer: Self-pay

## 2023-09-14 ENCOUNTER — Other Ambulatory Visit
Admission: RE | Admit: 2023-09-14 | Discharge: 2023-09-14 | Disposition: A | Discharge status: Home / Self Care | Payer: Self-pay | Source: Ambulatory Visit | Attending: Physician Assistant | Admitting: Physician Assistant

## 2023-09-14 ENCOUNTER — Other Ambulatory Visit: Payer: Self-pay

## 2023-09-14 MED ORDER — PREDNISONE 10 MG PO TABS
ORAL_TABLET | ORAL | 0 refills | Status: DC
  Filled 2023-09-14: qty 21, 6d supply, fill #0

## 2023-09-14 MED ORDER — AMOXICILLIN-POT CLAVULANATE 875-125 MG PO TABS
ORAL_TABLET | ORAL | 0 refills | Status: AC
Start: 2023-09-14 — End: ?
  Filled 2023-09-14: qty 14, 7d supply, fill #0

## 2023-09-14 MED ORDER — METOPROLOL SUCCINATE ER 25 MG PO TB24
50.0000 mg | ORAL_TABLET | Freq: Every day | ORAL | 11 refills | Status: DC
  Filled 2023-09-14: qty 60, 30d supply, fill #0

## 2023-09-14 MED ORDER — HYDROCOD POLI-CHLORPHE POLI ER 10-8 MG/5ML PO SUER
5.0000 mL | Freq: Two times a day (BID) | ORAL | 0 refills | Status: DC | PRN
  Filled 2023-09-14: qty 70, 7d supply, fill #0

## 2023-09-17 ENCOUNTER — Other Ambulatory Visit: Payer: Self-pay | Admitting: Cardiovascular Disease

## 2023-09-18 LAB — QUANTIFERON-TB GOLD PLUS (RQFGPL)
QuantiFERON Mitogen Value: >10 [IU]/mL
QuantiFERON Nil Value: 0.06 [IU]/mL
QuantiFERON TB1 Ag Value: 0.07 [IU]/mL
QuantiFERON TB2 Ag Value: 0.07 [IU]/mL

## 2023-09-18 LAB — QUANTIFERON-TB GOLD PLUS: QuantiFERON-TB Gold Plus: NEGATIVE

## 2023-09-20 ENCOUNTER — Other Ambulatory Visit: Payer: Self-pay

## 2023-09-20 MED ORDER — NICOTINE 21 MG/24HR TD PT24
MEDICATED_PATCH | TRANSDERMAL | 0 refills | Status: AC
Start: 2023-09-20 — End: ?
  Filled 2023-09-20 – 2023-11-16 (×3): qty 28, 28d supply, fill #0

## 2023-09-24 ENCOUNTER — Other Ambulatory Visit: Payer: Self-pay

## 2023-11-12 ENCOUNTER — Other Ambulatory Visit: Payer: Self-pay

## 2023-11-13 DIAGNOSIS — M79644 Pain in right finger(s): Secondary | ICD-10-CM | POA: Diagnosis not present

## 2023-11-13 DIAGNOSIS — R7309 Other abnormal glucose: Secondary | ICD-10-CM | POA: Diagnosis not present

## 2023-11-13 DIAGNOSIS — I2510 Atherosclerotic heart disease of native coronary artery without angina pectoris: Secondary | ICD-10-CM | POA: Diagnosis not present

## 2023-11-13 DIAGNOSIS — M7989 Other specified soft tissue disorders: Secondary | ICD-10-CM | POA: Diagnosis not present

## 2023-11-14 ENCOUNTER — Other Ambulatory Visit: Payer: Self-pay

## 2023-11-14 MED ORDER — ERGOCALCIFEROL 1.25 MG (50000 UT) PO CAPS
50000.0000 [IU] | ORAL_CAPSULE | ORAL | 1 refills | Status: AC
  Filled 2023-11-14: qty 13, 90d supply, fill #0
  Filled 2023-11-16: qty 13, 91d supply, fill #0
  Filled 2024-05-31: qty 13, 91d supply, fill #1
  Filled 2024-06-01: qty 13, 90d supply, fill #0

## 2023-11-16 ENCOUNTER — Other Ambulatory Visit: Payer: Self-pay

## 2023-11-25 ENCOUNTER — Emergency Department

## 2023-11-25 ENCOUNTER — Encounter: Payer: Self-pay | Admitting: Intensive Care

## 2023-11-25 ENCOUNTER — Other Ambulatory Visit: Payer: Self-pay

## 2023-11-25 ENCOUNTER — Emergency Department
Admission: EM | Admit: 2023-11-25 | Discharge: 2023-11-25 | Disposition: A | Discharge status: Home / Self Care | Attending: Emergency Medicine | Admitting: Emergency Medicine

## 2023-11-25 DIAGNOSIS — J441 Chronic obstructive pulmonary disease with (acute) exacerbation: Secondary | ICD-10-CM | POA: Diagnosis not present

## 2023-11-25 DIAGNOSIS — I2510 Atherosclerotic heart disease of native coronary artery without angina pectoris: Secondary | ICD-10-CM | POA: Insufficient documentation

## 2023-11-25 DIAGNOSIS — R0789 Other chest pain: Secondary | ICD-10-CM

## 2023-11-25 DIAGNOSIS — I110 Hypertensive heart disease with heart failure: Secondary | ICD-10-CM | POA: Diagnosis not present

## 2023-11-25 DIAGNOSIS — I5023 Acute on chronic systolic (congestive) heart failure: Secondary | ICD-10-CM | POA: Diagnosis not present

## 2023-11-25 DIAGNOSIS — F17200 Nicotine dependence, unspecified, uncomplicated: Secondary | ICD-10-CM | POA: Diagnosis not present

## 2023-11-25 DIAGNOSIS — R079 Chest pain, unspecified: Secondary | ICD-10-CM | POA: Diagnosis not present

## 2023-11-25 LAB — BRAIN NATRIURETIC PEPTIDE: B Natriuretic Peptide: 50.2 pg/mL (ref 0.0–100.0)

## 2023-11-25 LAB — CBC
HCT: 42.9 % (ref 36.0–46.0)
Hemoglobin: 14.4 g/dL (ref 12.0–15.0)
MCH: 31.4 pg (ref 26.0–34.0)
MCHC: 33.6 g/dL (ref 30.0–36.0)
MCV: 93.5 fL (ref 80.0–100.0)
Platelets: 282 K/uL (ref 150–400)
RBC: 4.59 MIL/uL (ref 3.87–5.11)
RDW: 12.8 % (ref 11.5–15.5)
WBC: 8.6 K/uL (ref 4.0–10.5)
nRBC: 0 % (ref 0.0–0.2)

## 2023-11-25 LAB — BASIC METABOLIC PANEL WITH GFR
Anion gap: 10 (ref 5–15)
BUN: 8 mg/dL (ref 6–20)
CO2: 25 mmol/L (ref 22–32)
Calcium: 9.2 mg/dL (ref 8.9–10.3)
Chloride: 105 mmol/L (ref 98–111)
Creatinine, Ser: 0.81 mg/dL (ref 0.44–1.00)
GFR, Estimated: >60 mL/min (ref >60)
Glucose, Bld: 99 mg/dL (ref 70–99)
Potassium: 3.1 mmol/L — ABNORMAL LOW (ref 3.5–5.1)
Sodium: 140 mmol/L (ref 135–145)

## 2023-11-25 LAB — PROTIME-INR
INR: 1 (ref 0.8–1.2)
Prothrombin Time: 13.3 s (ref 11.4–15.2)

## 2023-11-25 LAB — TROPONIN I (HIGH SENSITIVITY)
Troponin I (High Sensitivity): 3 ng/L (ref <18)
Troponin I (High Sensitivity): 4 ng/L (ref <18)

## 2023-11-25 LAB — MAGNESIUM: Magnesium: 2 mg/dL (ref 1.7–2.4)

## 2023-11-25 MED ORDER — METHYLPREDNISOLONE SODIUM SUCC 125 MG IJ SOLR
125.0000 mg | Freq: Once | INTRAMUSCULAR | Status: AC
  Administered 2023-11-25: 125 mg via INTRAVENOUS
  Filled 2023-11-25: qty 2

## 2023-11-25 MED ORDER — POTASSIUM CHLORIDE CRYS ER 20 MEQ PO TBCR
40.0000 meq | EXTENDED_RELEASE_TABLET | Freq: Once | ORAL | Status: AC
  Administered 2023-11-25: 40 meq via ORAL
  Filled 2023-11-25: qty 2

## 2023-11-25 MED ORDER — KETOROLAC TROMETHAMINE 30 MG/ML IJ SOLN
15.0000 mg | Freq: Once | INTRAMUSCULAR | Status: AC
  Administered 2023-11-25: 15 mg via INTRAVENOUS
  Filled 2023-11-25: qty 1

## 2023-11-25 MED ORDER — IPRATROPIUM-ALBUTEROL 0.5-2.5 (3) MG/3ML IN SOLN
6.0000 mL | Freq: Once | RESPIRATORY_TRACT | Status: AC
  Administered 2023-11-25: 6 mL via RESPIRATORY_TRACT
  Filled 2023-11-25: qty 3

## 2023-11-25 MED ORDER — PREDNISONE 50 MG PO TABS
50.0000 mg | ORAL_TABLET | Freq: Every day | ORAL | 0 refills | Status: AC
Start: 2023-11-25 — End: 2023-12-03
  Filled 2023-11-25: qty 4, 4d supply, fill #0

## 2023-11-25 MED ORDER — FUROSEMIDE 10 MG/ML IJ SOLN
60.0000 mg | Freq: Once | INTRAMUSCULAR | Status: AC
  Administered 2023-11-25: 60 mg via INTRAVENOUS
  Filled 2023-11-25: qty 8

## 2023-11-25 MED ORDER — POTASSIUM CHLORIDE 10 MEQ/100ML IV SOLN
10.0000 meq | Freq: Once | INTRAVENOUS | Status: AC
  Administered 2023-11-25: 10 meq via INTRAVENOUS
  Filled 2023-11-25: qty 100

## 2023-11-25 NOTE — Discharge Instructions (Signed)
 Stop smoking  Take a double dose (2 tablets) of your Lasix  for the next 2 days, then return to normal dosing after that  Prednisone  steroids once daily for 4 more days  Return to the ED with any worsening symptoms

## 2023-11-25 NOTE — ED Provider Notes (Signed)
 [facility] Provider Note    Event Date/Time   First MD Initiated Contact with Patient 11/25/23 1754     (approximate)   History   Chest Pain   HPI  MIKAYA BUNNER is a 48 y.o. female who presents to the ED for evaluation of Chest Pain   I reviewed [facility] cardiology clinic visit from 4 months ago.  History of CAD s/p 2023 RCA PCI/DES.  Moderately reduced EF.  Otherwise history of HTN, HLD, fibromyalgia, tobacco abuse, anxiety and hypokalemia.  Patient presents to the ED alongside her mother for evaluation of a few days of progressively worsening shortness of breath, dyspnea on exertion, unintentional weight gain, lower extremity swelling and right-sided chest pain.  Physical Exam   Triage Vital Signs: ED Triage Vitals [11/25/23 1743]  Encounter Vitals Group     BP 122/72     Girls Systolic BP Percentile      Girls Diastolic BP Percentile      Boys Systolic BP Percentile      Boys Diastolic BP Percentile      Pulse Rate 91     Resp 18     Temp 98.2 F (36.8 C)     Temp Source Oral     SpO2 100 %     Weight 182 lb (82.6 kg)     Height 5' 1 (1.549 m)     Head Circumference      Peak Flow      Pain Score 8     Pain Loc      Pain Education      Exclude from Growth Chart     Most recent vital signs: Vitals:   11/25/23 1743  BP: 122/72  Pulse: 91  Resp: 18  Temp: 98.2 F (36.8 C)  SpO2: 100%    General: Awake, no distress.  CV:  Good peripheral perfusion.  Resp:  Normal effort.  No distress or tachypnea.  Prolonged expiratory phase and faint and scattered expiratory wheezes.  Bibasilar crackles Abd:  No distention.  MSK:  No deformity noted.  Trace bilateral pitting edema. Neuro:  No focal deficits appreciated. Other:  Reproducible right sided parasternal chest pain on palpation without overlying skin changes or signs of trauma   ED Results / Procedures / Treatments   Labs (all labs ordered are listed, but only abnormal  results are displayed) Labs Reviewed  BASIC METABOLIC PANEL WITH GFR - Abnormal; Notable for the following components:      Result Value   Potassium 3.1 (*)    All other components within normal limits  CBC  PROTIME-INR  BRAIN NATRIURETIC PEPTIDE  MAGNESIUM   TROPONIN I (HIGH SENSITIVITY)  TROPONIN I (HIGH SENSITIVITY)    EKG Sinus rhythm with a rate of 93 bpm.  Normal axis.  Right bundle morphology.  No STEMI.  RADIOLOGY CXR interpreted by me without evidence of acute cardiopulmonary pathology.  Official radiology report(s): DG Chest 2 View Result Date: 11/25/2023 CLINICAL DATA:  Intermittent chest pain for 2-3 days. EXAM: CHEST - 2 VIEW COMPARISON:  06/06/2023 FINDINGS: Cardiac silhouette is normal in size and configuration. Stable right coronary artery stent. Normal mediastinal and hilar contours. Clear lungs.  No pleural effusion or pneumothorax. Skeletal structures are unremarkable. IMPRESSION: No active cardiopulmonary disease. Electronically Signed   By: Alm Parkins M.D.   On: 11/25/2023 19:00    PROCEDURES and INTERVENTIONS:  .1-3 Lead EKG Interpretation  Performed by: Claudene Rover, MD Authorized by: Claudene Rover,  MD     Interpretation: normal     ECG rate:  90   ECG rate assessment: normal     Rhythm: sinus rhythm     Ectopy: none     Conduction: normal     Medications  potassium chloride  SA (KLOR-CON  M) CR tablet 40 mEq (40 mEq Oral Given 11/25/23 1921)  furosemide  (LASIX ) injection 60 mg (60 mg Intravenous Given 11/25/23 1924)  ketorolac  (TORADOL ) 30 MG/ML injection 15 mg (15 mg Intravenous Given 11/25/23 1921)  ipratropium-albuterol  (DUONEB) 0.5-2.5 (3) MG/3ML nebulizer solution 6 mL (6 mLs Nebulization Given 11/25/23 1936)  methylPREDNISolone  sodium succinate (SOLU-MEDROL ) 125 mg/2 mL injection 125 mg (125 mg Intravenous Given 11/25/23 1923)  potassium chloride  10 mEq in 100 mL IVPB (0 mEq Intravenous Stopped 11/25/23 2109)  potassium chloride  SA (KLOR-CON  M) CR  tablet 40 mEq (40 mEq Oral Given 11/25/23 2112)     IMPRESSION / MDM / ASSESSMENT AND PLAN / ED COURSE  I reviewed the triage vital signs and the nursing notes.  Differential diagnosis includes, but is not limited to, ACS, PTX, PNA, muscle strain/spasm, PE, dissection, anxiety, pleural effusion  {Patient presents with symptoms of an acute illness or injury that is potentially life-threatening.  Patient presents to the ED with chest discomfort and shortness of breath that I suspect is multifactorial in the setting of COPD, volume overload and MSK pain.  EKG is nonischemic and first troponin is negative.  We will trend this and keep her on the monitor.  Will initiate IV diuresis, breathing treatments to help with her dyspnea but is likely from both COPD and CHF.  While she certainly has a history of CAD, her reproducible chest pain indicates a possible MSK etiology that we will treat with a small dose of IV Toradol .  Symptoms improved dramatically.  2 troponins remain low.  I considered admission for this patient but she is eager to go and I think would be reasonable to attempt outpatient management.  Discussed doubling her Lasix  dosing at home for 2 more days, steroid burst, ED return precautions.  Clinical Course as of 11/25/23 2218  [place] Nov 25, 2023  2054 Reassessed, feeling much better and requesting discharge.  2 troponins remain low.  We discussed expectant management, plan of care, return precautions. [DS]    Clinical Course User Index [DS] Claudene Rover, MD     FINAL CLINICAL IMPRESSION(S) / ED DIAGNOSES   Final diagnoses:  Other chest pain  COPD exacerbation (HCC)  Acute on chronic systolic congestive heart failure (HCC)     Rx / DC Orders   ED Discharge Orders          Ordered    predniSONE  (DELTASONE ) 50 MG tablet  Daily        11/25/23 2056             Note:  This document was prepared using Dragon voice recognition software and may include unintentional  dictation errors.   Claudene Rover, MD 11/25/23 2218

## 2023-11-25 NOTE — ED Triage Notes (Signed)
 C/o intermittent chest pain X2-3 days. Describes as central pressure and sob.   Patient reports recent weight gain  History CHF

## 2023-11-26 ENCOUNTER — Other Ambulatory Visit: Payer: Self-pay

## 2023-11-28 NOTE — Progress Notes (Unsigned)
 Cardiology Office Note    Date:  11/29/2023   ID:  Lisa Davila, DOB Nov 05, 1975, MRN [id number]  PCP:  Sadie Manna, MD  Cardiologist:  Deatrice Cage, MD  Electrophysiologist:  None   Chief Complaint: ED follow up  History of Present Illness:   Lisa Davila is a 47 y.o. female with history of CAD s/p PCI/DES to the RCA 06/2018 and mechanical thrombectomy with DES placement in 03/2022 for late stent thrombosis, HFrEF, PSVT s/p prior EP study with no inducible arrhythmia, fibromyalgia, hypertension, hyperlipidemia, tobacco abuse, anxiety, and hypokalemia who presents for ED follow up.    Patient presented 06/2021 with chest pain and was found to have inferior STEMI with emergent cardiac catheterization showing thrombotic subtotal occlusion of the right proximal RCA which was treated successfully with PCI and DES placement.  There was mild nonobstructive disease of affecting the left coronary arteries.  Echo showed EF 50 to 55%.  Brilinta  was switched to clopidogrel  due to persistent dyspnea.  Patient had recurrent chest pain prompting multiple ED visits.  Repeat echo 08/2021 showed EF of 35%.  Given newly reduced EF and ongoing symptoms, patient underwent right and left heart catheterization 11/2021 which showed patent RCA stent with mild in-stent restenosis and no evidence of obstructive disease affecting the left coronary arteries.  EF was 35%.  Right heart catheterization showed mildly to moderately elevated filling pressures, minimal pulmonary hypertension and normal cardiac output.  She presented 03/2022 with recurrent myocardial infarction.  Cardiac cath showed occluded RCA stent due to late stent thrombosis.  She underwent successful mechanical thrombectomy and DES placement.  She was switched from clopidogrel  to prasugrel .  Genotype testing subsequently showed an intermediate 2C-19 metabolic activity.  Echo showed EF 35 to 40%.  She had recurrent chest pain 06/2022 and underwent  repeat left heart catheterization which showed widely patent RCA stents with no significant restenosis, mildly reduced LV systolic function with EF of 45 to 50% with normal LV end-diastolic pressure.  Echo 03/2023 showed EF of 40 to 45%.   Patient was most recently seen by Dr. Cage 07/24/2023 and overall doing well from a cardiac perspective without recent chest pain.  She had been experiencing chest pain earlier in the year which was contributed to shingles.  She had endorsed chronic exertional dyspnea which was stable.  She was frustrated with her weight and it was recommended that she discuss initiation of Wegovy  with her PCP.  No further testing or medication changes were indicated at that time.  Patient had a recent ED visit 7/20 with complaints of progressive worsening shortness of breath, dyspnea on exertion, unintentional weight gain, lower extremity swelling, and right-sided chest pain for 2 days.  Vital signs were normal.  Potassium was low at 3.1 with otherwise normal labs including negative troponin x 2.  No acute ST/T wave abnormalities on EKG.  Chest x-ray negative.  She was treated with IV diuresis and breathing treatments for suspected dyspnea secondary to COPD and CHF.  Chest pain was reproducible and suspected to be musculoskeletal in etiology, treated with IV Toradol .  She was discharged with recommendation for doubling her Lasix  dose at home x 2 days with steroid burst.  Patient presents today overall feeling better since discharge from the ED. She believes that her chest tightness is related to anxiety due to conflict with her coworker. She denies any recent changes preceding this event. She reports some improvement in dyspnea on exertion, chest tightness, and abdominal tightness after receiving  IV lasix  and increasing home lasix . Lower extremity edema has resolved though she still feels tight in her abdomen. Yesterday she went back to taking her normal dose of Lasix  and today noted 2-3  pound weight gain. She also endorses intermittent orthopnea. She is without symptoms of exertional angina and cardiac decompensation. She denies lightheadedness, dizziness, palpitations, lower extremity swelling, and PND.   Labs independently reviewed: 11/25/2023-NA 140, K3.1, BUN 8, creatinine 0.81, Hgb 14.4, HCT 42.9, troponin negative x 2, BNP 50, mag 2.0 11/13/2023- TC 153, TG 134, HDL 43, LDL 83  Objective   Past Medical History:  Diagnosis Date   Acute ethmoidal sinusitis    Acute ethmoidal sinusitis    Acute maxillary sinusitis    Acute upper respiratory infections of unspecified site    Anxiety state, unspecified    Chest pain, unspecified    CHF (congestive heart failure) (HCC)    Coronary artery disease    Depression    Diarrhea    Esophageal reflux    Generalized anxiety disorder    Hyperlipidemia    Hypertension    Hypoglycemia, unspecified    Lumbago    Nausea with vomiting    Obstructive chronic bronchitis with exacerbation (HCC)    Other and unspecified hyperlipidemia    Other and unspecified peripheral vertigo(386.19)    Other bursitis disorders    Other malaise and fatigue    Other specified diseases due to viruses    Other vitamin B12 deficiency anemia    Pain in limb    Pernicious anemia    Sleep related hypoventilation/hypoxemia in conditions classifiable elsewhere    SVT (supraventricular tachycardia) (HCC)    Swelling, mass, or lump in head and neck    Tobacco use disorder    Unspecified disorder of external ear    Unspecified disorder of skin and subcutaneous tissue    Unspecified otitis media     Current Medications: Current Meds  Medication Sig   albuterol  (VENTOLIN  HFA) 108 (90 Base) MCG/ACT inhaler Inhale 2 puffs into the lungs every 6 (six) hours as needed for wheezing or shortness of breath.   ALPRAZolam  (XANAX ) 0.5 MG tablet Take 1 tablet (0.5 mg total) by mouth 3 (three) times daily as needed for sleep   cyanocobalamin  (VITAMIN B12) 1000  MCG tablet Take 1 tablet (1,000 mcg total) by mouth once daily   cyanocobalamin  1000 MCG tablet Take 1 tablet (1,000 mcg total) by mouth daily.   ergocalciferol  (VITAMIN D2) 1.25 MG (50000 UT) capsule Take 1 capsule (50,000 Units total) by mouth once a week.   ipratropium-albuterol  (DUONEB) 0.5-2.5 (3) MG/3ML SOLN Take 3 mls by nebulization 4 (four) times daily for 30 days   lidocaine  (LIDODERM ) 5 % Place 1 patch onto the skin daily. Apply patch to the most painful area for up to 12 hours in a 24 hour period.   metoprolol  succinate (TOPROL -XL) 50 MG 24 hr tablet Take 1 tablet (50 mg total) by mouth daily. Overdue follow up.  PLEASE CALL OFFICE TO SCHEDULE APPOINTMENT PRIOR TO NEXT REFILL   mometasone  (NASONEX ) 50 MCG/ACT nasal spray Place 2 sprays into the nose daily.   nicotine  (NICODERM CQ  - DOSED IN MG/24 HOURS) 21 mg/24hr patch Place 1 patch (21 mg total) onto the skin daily.   nitroGLYCERIN  (NITROSTAT ) 0.4 MG SL tablet Place 1 tablet (0.4 mg total) under the tongue every 5 (five) minutes as needed for chest pain.   prasugrel  (EFFIENT ) 10 MG TABS tablet Take 1 tablet (10  mg total) by mouth daily.   ranolazine  (RANEXA ) 1000 MG SR tablet Take 1 tablet (1,000 mg total) by mouth 2 (two) times daily.   rosuvastatin  (CRESTOR ) 40 MG tablet Take 1 tablet (40 mg total) by mouth daily.   spironolactone  (ALDACTONE ) 25 MG tablet TAKE 1 TABLET BY MOUTH DAILY   triamcinolone  cream (KENALOG ) 0.1 % Apply topically 2 (two) times daily   [DISCONTINUED] furosemide  (LASIX ) 20 MG tablet Take 1 tablet (20 mg total) by mouth daily.   [DISCONTINUED] potassium chloride  (KLOR-CON ) 8 MEQ tablet Take 8 tablets (64 mEq total) by mouth daily.    Allergies:   Buspirone, Citalopram, Metrizamide, Sulfa antibiotics, Ceftin  [cefuroxime ], Contrast media [iodinated contrast media], Sulfa drugs cross reactors, and Sulfasalazine   Social History   Socioeconomic History   Marital status: Single    Spouse name: Not on file    Number of children: Not on file   Years of education: Not on file   Highest education level: Not on file  Occupational History   Not on file  Tobacco Use   Smoking status: Every Day    Current packs/day: 0.00    Types: Cigarettes    Start date: 03/13/1992    Last attempt to quit: 03/13/2022    Years since quitting: 1.7   Smokeless tobacco: Never   Tobacco comments:    Has been smoking for 30 years Wants to quit after she gets past all the heart stuff and changes in life.  Smoking 3 a day right now  Vaping Use   Vaping status: Never Used  Substance and Sexual Activity   Alcohol use: No   Drug use: Never   Sexual activity: Not Currently  Other Topics Concern   Not on file  Social History Narrative   Lives with fiance, Pervis.   Social Drivers of Corporate investment banker Strain: Low Risk  (09/17/2023)   Received from [facility]   Overall Financial Resource Strain (CARDIA)    Difficulty of Paying Living Expenses: Not hard at all  Food Insecurity: No Food Insecurity (09/17/2023)   Received from [facility]   Hunger Vital Sign    Within the past 12 months, you worried that your food would run out before you got the money to buy more.: Never true    Within the past 12 months, the food you bought just didn't last and you didn't have money to get more.: Never true  Transportation Needs: No Transportation Needs (09/17/2023)   Received from [facility] - Transportation    In the past 12 months, has lack of transportation kept you from medical appointments or from getting medications?: No    Lack of Transportation (Non-Medical): No  Physical Activity: Inactive (03/26/2023)   Received from [facility]   Exercise Vital Sign    On average, how many days per week do you engage in moderate to strenuous exercise (like a brisk walk)?: 0 days    On average, how many minutes do you engage in exercise at this  level?: 0 min  Stress: Stress Concern Present (03/26/2023)   Received from [facility] Occupational [facility] - Occupational Stress Questionnaire    Feeling of Stress : Very much  Social Connections: Socially Isolated (03/26/2023)   Received from [facility]   Social Connection and Isolation Panel    In a typical week, how many times do  you talk on the phone with family, friends, or neighbors?: More than three times a week    How often do you get together with friends or relatives?: More than three times a week    How often do you attend church or religious services?: Never    Do you belong to any clubs or organizations such as church groups, unions, fraternal or athletic groups, or school groups?: No    How often do you attend meetings of the clubs or organizations you belong to?: Never    Are you married, widowed, divorced, separated, never married, or living with a partner?: Never married     Family History:  The patient's family history includes Breast cancer in her maternal grandmother; Cancer in her father and paternal grandfather; Depression in her mother; Hypertension in her sister.  ROS:   12-point review of systems is negative unless otherwise noted in the HPI.  EKGs/Other Studies Reviewed:    Studies reviewed were summarized above. The additional studies were reviewed today:  03/16/2023 Echo complete 1. Left ventricular ejection fraction, by estimation, is 40 to 45%. Left  ventricular ejection fraction by PLAX is 44 %. The left ventricle has mild  to moderately decreased function. The left ventricle demonstrates global  hypokinesis. Left ventricular  diastolic parameters are consistent with Grade I diastolic dysfunction  (impaired relaxation).   2. Right ventricular systolic function is normal. The right ventricular  size is normal.   3. The mitral valve is normal in structure. No evidence of mitral valve   regurgitation.   4. The aortic valve was not well visualized. Aortic valve regurgitation  is not visualized.   EKG:  EKG personally reviewed by me today EKG Interpretation Date/Time:  Thursday November 29 2023 08:43:12 EDT Ventricular Rate:  84 PR Interval:  170 QRS Duration:  88 QT Interval:  390 QTC Calculation: 460 R Axis:   -14  Text Interpretation: Sinus rhythm with occasional Premature ventricular complexes Low voltage QRS Inferior infarct (cited on or before 06-Jun-2023) When compared with ECG of 25-Nov-2023 17:39, Premature ventricular complexes are now Present QT has shortened Confirmed by Lorene Sinclair ([id number]) on 11/29/2023 8:45:50 AM  PHYSICAL EXAM:    VS:  BP 112/76   Pulse 84   Ht 5' 1 (1.549 m)   Wt 182 lb (82.6 kg)   SpO2 97%   BMI 34.39 kg/m   BMI: Body mass index is 34.39 kg/m.  Physical Exam Vitals and nursing note reviewed.  Constitutional:      General: She is not in acute distress.    Appearance: Normal appearance.  Cardiovascular:     Rate and Rhythm: Normal rate and regular rhythm.     Heart sounds: No murmur heard. Pulmonary:     Effort: Pulmonary effort is normal. No respiratory distress.     Breath sounds: No wheezing or rales.  Abdominal:     General: There is distension.     Tenderness: There is no abdominal tenderness.  Musculoskeletal:     Right lower leg: No edema.     Left lower leg: No edema.  Skin:    General: Skin is warm and dry.  Neurological:     General: No focal deficit present.     Mental Status: She is alert and oriented to person, place, and time. Mental status is at baseline.  Psychiatric:        Mood and Affect: Mood normal.        Behavior: Behavior normal.  Wt Readings from Last 3 Encounters:  11/29/23 182 lb (82.6 kg)  11/25/23 182 lb (82.6 kg)  07/24/23 176 lb (79.8 kg)        ASSESSMENT & PLAN:   Chronic systolic heart failure Ischemic cardiomyopathy - Most recent echo 03/2023 with EF 40-45% and G1DD.  Recent ED visit 7/20 with new onset DOE, orthopnea, abdominal fullness, lower extremity edema, and chest tightness. Given IV Lasix  in the ED, steroids, and instructions to increase home Lasix  to 40 mg daily x 2 days. She reports improvements in symptoms with increased Lasix . However, since resuming Lasix  20 mg daily, she has noted weight gain and worsening of symptoms. Continue metoprolol  succinate 50 mg daily and spironolactone  25 mg daily. Low BP limits further titration of GDMT. Check BMP and BNP today. Increase Lasix  to 40 mg daily. Increase Kcl to 80 mEq. Repeat BMP in 1 week. Repeat echocardiogram.   Coronary artery disease - Most recent cardiac cath 06/2022 showed patent RCA stents with otherwise nonobstructive disease. Patient is without symptoms of exertional angina and cardiac decompensation. Continue ASA 81 mg daily and Effient  10 mg daily.   Paroxysmal SVT - Well controlled on metoprolol  succinate.   Hyperlipidemia - Most recent lipid panel with LDL 83. Continue rosuvastatin  40 mg daily. Consider addition of ezetimibe at next visit.   Tobacco use - Complete cessation encouraged.   Obesity - Encouraged healthy lifestyle modifications. Unfortunately, her insurance denied coverage of Wegovy .    Disposition: F/u with Dr. Darron or an APP in 4-6 weeks or after echo.   Medication Adjustments/Labs and Tests Ordered: Current medicines are reviewed at length with the patient today.  Concerns regarding medicines are outlined above. Medication changes, Labs and Tests ordered today are summarized above and listed in the Patient Instructions accessible in Encounters.   Bonney Lesley Maffucci, PA-C 11/29/2023 9:23 AM     [facility] [place] [address] [place], [place] [address] [phone]

## 2023-11-29 ENCOUNTER — Encounter: Payer: Self-pay | Admitting: Physician Assistant

## 2023-11-29 ENCOUNTER — Other Ambulatory Visit: Payer: Self-pay

## 2023-11-29 ENCOUNTER — Ambulatory Visit: Attending: Physician Assistant | Admitting: Physician Assistant

## 2023-11-29 VITALS — BP 112/76 | HR 84 | Ht 61.0 in | Wt 182.0 lb

## 2023-11-29 DIAGNOSIS — Z79899 Other long term (current) drug therapy: Secondary | ICD-10-CM | POA: Insufficient documentation

## 2023-11-29 DIAGNOSIS — I471 Supraventricular tachycardia, unspecified: Secondary | ICD-10-CM | POA: Diagnosis not present

## 2023-11-29 DIAGNOSIS — I25118 Atherosclerotic heart disease of native coronary artery with other forms of angina pectoris: Secondary | ICD-10-CM | POA: Insufficient documentation

## 2023-11-29 DIAGNOSIS — Z720 Tobacco use: Secondary | ICD-10-CM | POA: Insufficient documentation

## 2023-11-29 DIAGNOSIS — I255 Ischemic cardiomyopathy: Secondary | ICD-10-CM | POA: Insufficient documentation

## 2023-11-29 DIAGNOSIS — I5022 Chronic systolic (congestive) heart failure: Secondary | ICD-10-CM | POA: Insufficient documentation

## 2023-11-29 DIAGNOSIS — E785 Hyperlipidemia, unspecified: Secondary | ICD-10-CM | POA: Diagnosis not present

## 2023-11-29 MED ORDER — POTASSIUM CHLORIDE ER 20 MEQ PO TBCR
80.0000 meq | EXTENDED_RELEASE_TABLET | Freq: Every day | ORAL | 11 refills | Status: DC
  Filled 2023-11-29: qty 120, 30d supply, fill #0

## 2023-11-29 MED ORDER — FUROSEMIDE 40 MG PO TABS
40.0000 mg | ORAL_TABLET | Freq: Every day | ORAL | 3 refills | Status: DC
  Filled 2023-11-29: qty 30, 30d supply, fill #0

## 2023-11-29 NOTE — Patient Instructions (Addendum)
 Medication Instructions:  Your physician recommends the following medication changes.  INCREASE: Lasix  40 mg daily Potassium 80 mEq daily - prescription changed to larger dose   *If you need a refill on your cardiac medications before your next appointment, please call your pharmacy*  Lab Work: Your provider would like for you to have following labs drawn today BNP and BMP  Your provider would like for you to return in 1 week to have the following labs drawn: BMP.   Please go to [facility] [address] [address], [place] [address] You do not need an appointment.  They are open from 8 am- 4:30 pm.  Lunch from 1:00 pm- 2:00 pm You DO NOT need to be fasting.   If you have labs (blood work) drawn today and your tests are completely normal, you will receive your results only by: MyChart Message (if you have MyChart) OR A paper copy in the mail If you have any lab test that is abnormal or we need to change your treatment, we will call you to review the results.  Testing/Procedures: Your physician has requested that you have an echocardiogram. Echocardiography is a painless test that uses sound waves to create images of your heart. It provides your doctor with information about the size and shape of your heart and how well your heart's chambers and valves are working.   You may receive an ultrasound enhancing agent through an IV if needed to better visualize your heart during the echo. This procedure takes approximately one hour.  There are no restrictions for this procedure.  This will take place at [address] [facility]) [address], [place] [address]  Please note: We ask at that you not bring children with you during ultrasound (echo/ vascular) testing. Due to room size and safety concerns, children are not allowed in the ultrasound rooms during exams. Our front office staff cannot provide observation of children in our lobby area while  testing is being conducted. An adult accompanying a patient to their appointment will only be allowed in the ultrasound room at the discretion of the ultrasound technician under special circumstances. We apologize for any inconvenience.  Follow-Up: At [facility], you and your health needs are our priority.  As part of our continuing mission to provide you with exceptional heart care, our providers are all part of one team.  This team includes your primary Cardiologist (physician) and Advanced Practice Providers or APPs (Physician Assistants and Nurse Practitioners) who all work together to provide you with the care you need, when you need it.  Your next appointment:   4-6 week(s) (a few days after Echo)  Provider:   You may see Deatrice Cage, MD or Bernardino Bring, PA-C     High potassium foods include many fruits and vegetables like bananas, spinach, sweet potatoes, avocados, and potatoes. Other sources include dried fruits, lentils, and yogurt.   Here's a more detailed list of high potassium foods: Fruits: Bananas, avocados, apricots, raisins and prunes, cantaloupe, peas, and oranges Vegetables: Spinach, zucchini, sweet potatoes, potatoes, beetroot, winter squash, leafy greens (e.g., Swiss chard, kale) Other: Lentils, yogurt, dried fruit (e.g., raisins, dates), soups, condimients, nuts, and seeds Some types of fish (e.g., salmon, trout, tuna, halibut, cod, trout, whitefish, rockfish), clams, sardines, scallops, lobster Most processed meats, ham, ground beef, sirloin steak (and most other beef and pork products) Legumes (e.g., kidney beans, navy beans, great northern beans, pinto beans, black beans) Juice from potassium-rich fruits  is also a high source : Orange, tomato, prune, apricot and grapefruit juice

## 2023-12-01 LAB — BASIC METABOLIC PANEL WITH GFR
BUN/Creatinine Ratio: 8 — ABNORMAL LOW (ref 9–23)
BUN: 8 mg/dL (ref 6–24)
CO2: 25 mmol/L (ref 20–29)
Calcium: 9.5 mg/dL (ref 8.7–10.2)
Chloride: 101 mmol/L (ref 96–106)
Creatinine, Ser: 0.95 mg/dL (ref 0.57–1.00)
Glucose: 80 mg/dL (ref 70–99)
Potassium: 3.9 mmol/L (ref 3.5–5.2)
Sodium: 142 mmol/L (ref 134–144)
eGFR: 74 mL/min/1.73 (ref >59)

## 2023-12-01 LAB — BRAIN NATRIURETIC PEPTIDE: BNP: 33.5 pg/mL (ref 0.0–100.0)

## 2023-12-03 ENCOUNTER — Ambulatory Visit: Payer: Self-pay | Admitting: Physician Assistant

## 2023-12-05 ENCOUNTER — Other Ambulatory Visit: Payer: Self-pay | Admitting: Emergency Medicine

## 2023-12-05 DIAGNOSIS — Z79899 Other long term (current) drug therapy: Secondary | ICD-10-CM

## 2023-12-05 DIAGNOSIS — R072 Precordial pain: Secondary | ICD-10-CM

## 2023-12-05 DIAGNOSIS — I255 Ischemic cardiomyopathy: Secondary | ICD-10-CM

## 2023-12-06 LAB — BASIC METABOLIC PANEL WITH GFR
BUN/Creatinine Ratio: 7 — ABNORMAL LOW (ref 9–23)
BUN: 7 mg/dL (ref 6–24)
CO2: 23 mmol/L (ref 20–29)
Calcium: 9.4 mg/dL (ref 8.7–10.2)
Chloride: 98 mmol/L (ref 96–106)
Creatinine, Ser: 0.98 mg/dL (ref 0.57–1.00)
Glucose: 75 mg/dL (ref 70–99)
Potassium: 3.9 mmol/L (ref 3.5–5.2)
Sodium: 139 mmol/L (ref 134–144)
eGFR: 71 mL/min/1.73 (ref >59)

## 2023-12-11 ENCOUNTER — Telehealth: Payer: Self-pay

## 2023-12-11 ENCOUNTER — Encounter: Payer: Self-pay | Admitting: Cardiovascular Disease

## 2023-12-11 ENCOUNTER — Other Ambulatory Visit: Payer: Self-pay | Admitting: Cardiology

## 2023-12-11 DIAGNOSIS — H538 Other visual disturbances: Secondary | ICD-10-CM | POA: Diagnosis not present

## 2023-12-11 NOTE — Telephone Encounter (Signed)
 Called patient to get clarification on her question sent to pre-op.

## 2023-12-12 ENCOUNTER — Telehealth: Payer: Self-pay

## 2023-12-12 ENCOUNTER — Other Ambulatory Visit: Payer: Self-pay

## 2023-12-12 MED ORDER — ALPRAZOLAM 1 MG PO TABS
2.0000 mg | ORAL_TABLET | ORAL | 0 refills | Status: DC
  Filled 2023-12-12: qty 1, 1d supply, fill #0
  Filled 2023-12-13: qty 2, 1d supply, fill #0

## 2023-12-12 NOTE — Telephone Encounter (Signed)
 Spoke with General Dentistry for the [facility], DDS office. The dentist office is not requesting cardiac clearance. The dentist office office just stated that the pt had talked about maybe needing something for anxiety. I informed them that if something changes and they need cardiac clearance to just let our office know.  I will inform the patient that the dentist office is not requesting cardiac clearance for the 2 teeth extractions.        Hannah Marcus KIDD, RN routed this conversation to [employer] Preop  Cv Div Preop Callback  Alvia Lucie LABOR, CMA (Selected Message) Verneita LABOR Bryant to P Cv Div Burl Triage (supporting Deatrice LABOR Cage, MD)     12/11/23  5:25 PM Charlie littie Pesa  [phone] Me to MERIBETH VITUG     12/11/23  5:23 PM Good Afternoon,   The Preop team tried calling. Would you be able to give us  the name of the dentist office and there number and we will call and get all the information to work on the Preop clearance.   Thank you, Lucie Alvia, CMA  Last read by Verneita LABOR Bryant at 5:52AM on 12/12/2023. Emelia Josefa HERO, NP to Cv Div Preop Callback     12/11/23  4:19 PM Preoperative team, please contact requesting office and ask for details surrounding procedure.  Thank you for your help.   Josefa HERO. Cleaver NP-C      12/11/2023, 4:19 PM [facility] [address] Office [phone] Fax [phone] Sebastian Clarity, LPN to Cv Div Preop     12/11/23  4:09 PM Can you guys assist with this?    Thank you!  Verneita LABOR Bryant to P Cv Div Burl Triage (supporting Deatrice LABOR Cage, MD)     12/11/23  4:05 PM Just being numbed Sebastian Clarity, LPN to EKTA DANCER     12/11/23  4:03 PM Ms.Osbourn,  We can send this to our cardiac clearance team to advise further.    Are you being put to sleep or just being numbed?    Thank you        Last read by Verneita LABOR Bryant at 5:52AM on  12/12/2023. Verneita LABOR Trussell to P Cv Div Burl Triage (supporting Deatrice LABOR Cage, MD)     12/11/23  3:58 PM Dr Cage I'm having two extractions done on Thursday cardiac wise. Am I OK to have those two teeth pulled?                                    Thank you Verneita

## 2023-12-13 ENCOUNTER — Other Ambulatory Visit: Payer: Self-pay

## 2023-12-16 ENCOUNTER — Telehealth: Payer: Self-pay | Admitting: Cardiology

## 2023-12-16 MED ORDER — PRASUGREL HCL 10 MG PO TABS: 10.0000 mg | ORAL_TABLET | Freq: Every day | ORAL | 0 refills | Status: DC

## 2023-12-16 NOTE — Telephone Encounter (Signed)
 Patient called the answering service a second time. Reports that when she went to [facility] to get her Effient  prescription filled, her insurance would not cover it because it had already been filled through the mail pharmacy. Patient asked pharmacy if she could have one pill, but it would cost her $25.   Discussed that as her last stent was placed in 03/2022 and she has been compliant with her aspirin , one missed dose of Effient  is very unlikely to cause any problems. Recommended she makes sure to take her aspirin  and tomorrow morning call the mail in pharmacy to track the Effient  prescription that she was mailed.   Patient voiced understanding

## 2023-12-16 NOTE — Telephone Encounter (Signed)
 Patient called the answering service this morning concerned that she has ran out of her Effient .  She uses a pharmacy that typically mails her medications to her.  Her medications were supposed to be mailed out on Thursday and should have arrived by now.  She is unsure what is causing the delay.  She does not have any Effient  to take this evening and would like a new prescription to take while awaiting for her mailed medications to arrive.   Sent a 30 day supply of Effient  to her requested pharmacy   Rollo FABIENE Louder, PA-C 12/16/2023 10:04 AM

## 2023-12-20 ENCOUNTER — Other Ambulatory Visit: Payer: Self-pay

## 2023-12-20 MED ORDER — NYSTATIN 100000 UNIT/ML MT SUSP
5.0000 mL | Freq: Three times a day (TID) | OROMUCOSAL | 0 refills | Status: AC
  Filled 2023-12-20: qty 150, 10d supply, fill #0

## 2023-12-31 ENCOUNTER — Other Ambulatory Visit: Payer: Self-pay

## 2024-01-08 NOTE — Telephone Encounter (Signed)
Forms have been signed.

## 2024-01-11 ENCOUNTER — Other Ambulatory Visit: Payer: Self-pay

## 2024-01-11 MED ORDER — ALPRAZOLAM 0.5 MG PO TABS
0.5000 mg | ORAL_TABLET | Freq: Three times a day (TID) | ORAL | 0 refills | Status: DC | PRN
  Filled 2024-01-11 – 2024-01-21 (×2): qty 90, 30d supply, fill #0

## 2024-01-14 ENCOUNTER — Other Ambulatory Visit: Payer: Self-pay

## 2024-01-14 MED ORDER — TRAMADOL HCL 50 MG PO TABS
50.0000 mg | ORAL_TABLET | Freq: Four times a day (QID) | ORAL | 0 refills | Status: AC | PRN
  Filled 2024-01-14 – 2024-01-25 (×2): qty 20, 5d supply, fill #0

## 2024-01-15 ENCOUNTER — Other Ambulatory Visit

## 2024-01-18 ENCOUNTER — Ambulatory Visit: Admitting: Physician Assistant

## 2024-01-21 ENCOUNTER — Other Ambulatory Visit

## 2024-01-21 ENCOUNTER — Other Ambulatory Visit: Payer: Self-pay

## 2024-01-25 ENCOUNTER — Other Ambulatory Visit: Payer: Self-pay

## 2024-01-25 ENCOUNTER — Ambulatory Visit: Payer: Self-pay | Admitting: Nurse Practitioner

## 2024-01-31 ENCOUNTER — Other Ambulatory Visit: Payer: Self-pay

## 2024-01-31 MED ORDER — ONDANSETRON HCL 4 MG PO TABS
4.0000 mg | ORAL_TABLET | Freq: Three times a day (TID) | ORAL | 0 refills | Status: AC | PRN
  Filled 2024-01-31: qty 20, 7d supply, fill #0

## 2024-02-05 ENCOUNTER — Ambulatory Visit
Admission: RE | Admit: 2024-02-05 | Discharge: 2024-02-05 | Disposition: A | Discharge status: Home / Self Care | Source: Ambulatory Visit | Attending: Physician Assistant | Admitting: Physician Assistant

## 2024-02-05 DIAGNOSIS — I2510 Atherosclerotic heart disease of native coronary artery without angina pectoris: Secondary | ICD-10-CM | POA: Insufficient documentation

## 2024-02-05 DIAGNOSIS — I5022 Chronic systolic (congestive) heart failure: Secondary | ICD-10-CM | POA: Diagnosis not present

## 2024-02-05 DIAGNOSIS — I517 Cardiomegaly: Secondary | ICD-10-CM | POA: Diagnosis not present

## 2024-02-05 DIAGNOSIS — I252 Old myocardial infarction: Secondary | ICD-10-CM | POA: Insufficient documentation

## 2024-02-05 LAB — ECHOCARDIOGRAM COMPLETE
AR max vel: 1.98 cm2
AV Area VTI: 2.04 cm2
AV Area mean vel: 2.08 cm2
AV Mean grad: 3 mmHg
AV Peak grad: 5.4 mmHg
Ao pk vel: 1.16 m/s
Area-P 1/2: 4.21 cm2
Calc EF: 53.8 %
MV VTI: 2.53 cm2
S' Lateral: 3.7 cm
Single Plane A2C EF: 66.2 %
Single Plane A4C EF: 45.8 %

## 2024-02-08 ENCOUNTER — Other Ambulatory Visit: Payer: Self-pay

## 2024-02-08 DIAGNOSIS — B370 Candidal stomatitis: Secondary | ICD-10-CM | POA: Diagnosis not present

## 2024-02-08 DIAGNOSIS — R110 Nausea: Secondary | ICD-10-CM | POA: Diagnosis not present

## 2024-02-08 DIAGNOSIS — H9201 Otalgia, right ear: Secondary | ICD-10-CM | POA: Diagnosis not present

## 2024-02-08 MED ORDER — FLUCONAZOLE 100 MG PO TABS
100.0000 mg | ORAL_TABLET | Freq: Every day | ORAL | 1 refills | Status: AC
  Filled 2024-02-08: qty 14, 14d supply, fill #0

## 2024-02-08 MED ORDER — ONDANSETRON 4 MG PO TBDP
4.0000 mg | ORAL_TABLET | Freq: Three times a day (TID) | ORAL | 2 refills | Status: AC | PRN
Start: 2024-02-08 — End: ?
  Filled 2024-02-08: qty 20, 7d supply, fill #0

## 2024-02-13 ENCOUNTER — Other Ambulatory Visit: Payer: Self-pay

## 2024-02-13 ENCOUNTER — Encounter: Payer: Self-pay | Admitting: Nurse Practitioner

## 2024-02-13 ENCOUNTER — Ambulatory Visit: Attending: Nurse Practitioner | Admitting: Physician Assistant

## 2024-02-13 VITALS — BP 100/70 | HR 93 | Ht 61.0 in | Wt 183.1 lb

## 2024-02-13 DIAGNOSIS — I5022 Chronic systolic (congestive) heart failure: Secondary | ICD-10-CM

## 2024-02-13 DIAGNOSIS — I25112 Atherosclerotic heart disease of native coronary artery with refractory angina pectoris: Secondary | ICD-10-CM

## 2024-02-13 DIAGNOSIS — R072 Precordial pain: Secondary | ICD-10-CM | POA: Diagnosis not present

## 2024-02-13 DIAGNOSIS — Z720 Tobacco use: Secondary | ICD-10-CM

## 2024-02-13 DIAGNOSIS — E7800 Pure hypercholesterolemia, unspecified: Secondary | ICD-10-CM

## 2024-02-13 DIAGNOSIS — E785 Hyperlipidemia, unspecified: Secondary | ICD-10-CM

## 2024-02-13 DIAGNOSIS — I10 Essential (primary) hypertension: Secondary | ICD-10-CM

## 2024-02-13 DIAGNOSIS — I255 Ischemic cardiomyopathy: Secondary | ICD-10-CM

## 2024-02-13 DIAGNOSIS — I471 Supraventricular tachycardia, unspecified: Secondary | ICD-10-CM

## 2024-02-13 MED ORDER — PRASUGREL HCL 10 MG PO TABS
10.0000 mg | ORAL_TABLET | Freq: Every day | ORAL | 3 refills | Status: DC
  Filled 2024-02-13: qty 90, 90d supply, fill #0

## 2024-02-13 NOTE — Patient Instructions (Signed)
 Medication Instructions:   Your physician recommends that you continue on your current medications as directed. Please refer to the Current Medication list given to you today.    *If you need a refill on your cardiac medications before your next appointment, please call your pharmacy*  Lab Work:  None ordered at this time   If you have labs (blood work) drawn today and your tests are completely normal, you will receive your results only by:  MyChart Message (if you have MyChart) OR  A paper copy in the mail If you have any lab test that is abnormal or we need to change your treatment, we will call you to review the results.  Testing/Procedures:  Your provider has ordered a Lexiscan/ Exercise Myoview Stress test. This will take place at [facility]. Please report to the [facility] medical mall entrance. The volunteers at the first desk will direct you where to go.  [facility] MYOVIEW  Your provider has ordered a Stress Test with nuclear imaging. The purpose of this test is to evaluate the blood supply to your heart muscle. This procedure is referred to as a Non-Invasive Stress Test. This is because other than having an IV started in your vein, nothing is inserted or invades your body. Cardiac stress tests are done to find areas of poor blood flow to the heart by determining the extent of coronary artery disease (CAD). Some patients exercise on a treadmill, which naturally increases the blood flow to your heart, while others who are unable to walk on a treadmill due to physical limitations will have a pharmacologic/chemical stress agent called Lexiscan . This medicine will mimic walking on a treadmill by temporarily increasing your coronary blood flow.   Please note: these test may take anywhere between 2-4 hours to complete  How to prepare for your Myoview test:  Nothing to eat for 6 hours prior to the test No caffeine for 24 hours prior to test No smoking 24 hours prior to test. Your medication may be  taken with water .  If your doctor stopped a medication because of this test, do not take that medication. Ladies, please do not wear dresses.  Skirts or pants are appropriate. Please wear a short sleeve shirt. No perfume, cologne or lotion. Wear comfortable walking shoes. No heels!   PLEASE NOTIFY THE OFFICE AT LEAST 24 HOURS IN ADVANCE IF YOU ARE UNABLE TO KEEP YOUR APPOINTMENT.  [phone] AND  PLEASE NOTIFY NUCLEAR MEDICINE AT [facility] AT LEAST 24 HOURS IN ADVANCE IF YOU ARE UNABLE TO KEEP YOUR APPOINTMENT. [phone]   Referrals:  None ordered at this time   Follow-Up:  At [facility], you and your health needs are our priority.  As part of our continuing mission to provide you with exceptional heart care, our providers are all part of one team.  This team includes your primary Cardiologist (physician) and Advanced Practice Providers or APPs (Physician Assistants and Nurse Practitioners) who all work together to provide you with the care you need, when you need it.  Your next appointment:   2 - 3 month(s)  Provider:    You may see Deatrice Cage, MD or one of the following Advanced Practice Providers on your designated Care Team:   Lonni Meager, NP Lesley Maffucci, PA-C Bernardino Bring, PA-C Cadence [place], PA-C Tylene Lunch, NP Barnie Hila, NP    We recommend signing up for the patient portal called MyChart.  Sign up information is provided on this After Visit Summary.  MyChart  is used to connect with patients for Virtual Visits (Telemedicine).  Patients are able to view lab/test results, encounter notes, upcoming appointments, etc.  Non-urgent messages can be sent to your provider as well.   To learn more about what you can do with MyChart, go to [email or web address].

## 2024-02-13 NOTE — Progress Notes (Signed)
 Cardiology Office Note    Date:  02/13/2024   ID:  Lisa Davila, DOB [birth year], MRN [id number]  PCP:  Sadie Manna, MD  Cardiologist:  Deatrice Cage, MD  Electrophysiologist:  None   Chief Complaint: Follow up  History of Present Illness:   Lisa Davila is a 48 y.o. female with history of CAD s/p PCI/DES to the RCA 06/2018 and mechanical thrombectomy with DES placement in 03/2022 for late stent thrombosis, HFrEF, PSVT s/p prior EP study with no inducible arrhythmia, fibromyalgia, hypertension, hyperlipidemia, tobacco abuse, anxiety, and hypokalemia who presents for follow up on chronic systolic heart failure.     Patient presented 06/2021 with chest pain and was found to have inferior STEMI with emergent cardiac catheterization showing thrombotic subtotal occlusion of the right proximal RCA which was treated successfully with PCI and DES placement.  There was mild nonobstructive disease of affecting the left coronary arteries.  Echo showed EF 50 to 55%.  Brilinta  was switched to clopidogrel  due to persistent dyspnea.  Patient had recurrent chest pain prompting multiple ED visits.  Repeat echo 08/2021 showed EF of 35%.  Given newly reduced EF and ongoing symptoms, patient underwent right and left heart catheterization 11/2021 which showed patent RCA stent with mild in-stent restenosis and no evidence of obstructive disease affecting the left coronary arteries.  EF was 35%.  Right heart catheterization showed mildly to moderately elevated filling pressures, minimal pulmonary hypertension and normal cardiac output.  She presented 03/2022 with recurrent myocardial infarction.  Cardiac cath showed occluded RCA stent due to late stent thrombosis.  She underwent successful mechanical thrombectomy and DES placement.  She was switched from clopidogrel  to prasugrel .  Genotype testing subsequently showed an intermediate 2C-19 metabolic activity.  Echo showed EF 35 to 40%.  She had recurrent chest  pain 06/2022 and underwent repeat left heart catheterization which showed widely patent RCA stents with no significant restenosis, mildly reduced LV systolic function with EF of 45 to 50% with normal LV end-diastolic pressure.  Echo 03/2023 showed EF of 40 to 45%.   Patient was evaluated in the ED 11/2023 with progressive SOB, DOE, weight gain, lower extremity swelling, and right sided chest pain. Potassium was low and troponin was negative x 2. No acute EKG changes. She was treated with IV diuresis and breathing treatments for COPD and CHF. Chest pain was reproducible and suspected to be musculoskeletal in etiology. She was discharged with recommendation to double her dose of Lasix  for 2 days with a steroid burst.   She was most recently seen by myself for ED follow up and reported overall feeling better.  She attributed her chest tightness to anxiety due to a conflict with her coworker.  Lower extremity swelling and dyspnea have improved but she reported abdominal fullness remained.  She had gone back to taking her normal dose of Lasix  and noted 2 to 3 pound weight gain overnight.  Lasix  dose was increased to 40 mg daily with subsequent increase in potassium chloride  to 80 mill equivalents daily.  Repeat echo was ordered and obtained 02/05/2024 which showed EF slightly improved to 45 to 50% with RWMA, mild LVH, G1 DD.  Patient presents today overall doing well from a cardiac perspective.  She reports improvement in swelling with titration of Lasix .  Upon further discussion, she reports intermittent episodes of chest pressure.  She describes this as something sitting on her chest.  She reports that this occurs randomly, both at rest and with exertion. Does  not limit activity.  She suspects that this is likely secondary to anxiety as it improves after she takes her medication.  However, she still worries that this could be related to her heart.  She also wonders if this could be related to steady weight gain over  time.  She endorses mild dyspnea on exertion which is unchanged from baseline.  She denies palpitations, lightheadedness, dizziness, and lower extremity swelling.  Labs independently reviewed: 02/2024-hemoglobin 14.8, hematocrit 43.7, platelets 291, sodium 143, potassium 4.1, BUN 5, creatinine 0.7 11/2023-TC 153, TG 134, HDL 43, LDL 83  Objective   Past Medical History:  Diagnosis Date   Acute ethmoidal sinusitis    Acute ethmoidal sinusitis    Acute maxillary sinusitis    Acute upper respiratory infections of unspecified site    Anxiety state, unspecified    Chest pain, unspecified    CHF (congestive heart failure) (HCC)    Coronary artery disease    Depression    Diarrhea    Esophageal reflux    Generalized anxiety disorder    Hyperlipidemia    Hypertension    Hypoglycemia, unspecified    Lumbago    Nausea with vomiting    Obstructive chronic bronchitis with exacerbation (HCC)    Other and unspecified hyperlipidemia    Other and unspecified peripheral vertigo(386.19)    Other bursitis disorders    Other malaise and fatigue    Other specified diseases due to viruses    Other vitamin B12 deficiency anemia    Pain in limb    Pernicious anemia    Sleep related hypoventilation/hypoxemia in conditions classifiable elsewhere    SVT (supraventricular tachycardia)    Swelling, mass, or lump in head and neck    Tobacco use disorder    Unspecified disorder of external ear    Unspecified disorder of skin and subcutaneous tissue    Unspecified otitis media     Current Medications: Current Meds  Medication Sig   albuterol  (VENTOLIN  HFA) 108 (90 Base) MCG/ACT inhaler Inhale 2 puffs into the lungs every 6 (six) hours as needed for wheezing or shortness of breath.   ALPRAZolam  (XANAX ) 0.5 MG tablet Take 1 tablet (0.5 mg total) by mouth 3 (three) times daily as needed for sleep   aspirin  81 MG chewable tablet Chew 1 tablet (81 mg total) by mouth daily.   chlorpheniramine-HYDROcodone   (TUSSIONEX) 10-8 MG/5ML Take 5 mLs by mouth every 12 (twelve) hours as needed for up to 10 days   cyanocobalamin  (VITAMIN B12) 1000 MCG tablet Take 1 tablet (1,000 mcg total) by mouth once daily   ergocalciferol  (VITAMIN D2) 1.25 MG (50000 UT) capsule Take 1 capsule (50,000 Units total) by mouth once a week.   furosemide  (LASIX ) 40 MG tablet Take 1 tablet (40 mg total) by mouth daily.   ipratropium-albuterol  (DUONEB) 0.5-2.5 (3) MG/3ML SOLN Take 3 mls by nebulization 4 (four) times daily for 30 days   lidocaine  (LIDODERM ) 5 % Place 1 patch onto the skin daily. Apply patch to the most painful area for up to 12 hours in a 24 hour period.   metoprolol  succinate (TOPROL -XL) 50 MG 24 hr tablet Take 1 tablet (50 mg total) by mouth daily. Overdue follow up.  PLEASE CALL OFFICE TO SCHEDULE APPOINTMENT PRIOR TO NEXT REFILL   mometasone  (NASONEX ) 50 MCG/ACT nasal spray Place 2 sprays into the nose daily.   nicotine  (NICODERM CQ  - DOSED IN MG/24 HOURS) 21 mg/24hr patch Place 1 patch (21 mg total) onto the  skin daily.   nitroGLYCERIN  (NITROSTAT ) 0.4 MG SL tablet Place 1 tablet (0.4 mg total) under the tongue every 5 (five) minutes as needed for chest pain.   ondansetron  (ZOFRAN ) 4 MG tablet Take 1 tablet (4 mg total) by mouth every 8 (eight) hours as needed.   Potassium Chloride  ER 20 MEQ TBCR Take 4 tablets (80 mEq total) by mouth daily.   ranolazine  (RANEXA ) 1000 MG SR tablet Take 1 tablet (1,000 mg total) by mouth 2 (two) times daily.   rosuvastatin  (CRESTOR ) 40 MG tablet Take 1 tablet (40 mg total) by mouth daily.   spironolactone  (ALDACTONE ) 25 MG tablet TAKE 1 TABLET BY MOUTH DAILY   [DISCONTINUED] prasugrel  (EFFIENT ) 10 MG TABS tablet Take 1 tablet (10 mg total) by mouth daily.    Allergies:   Buspirone, Citalopram, Metrizamide, Sulfa antibiotics, Ceftin  [cefuroxime ], Contrast media [iodinated contrast media], Sulfa drugs cross reactors, and Sulfasalazine   Social History   Socioeconomic History    Marital status: Single    Spouse name: Not on file   Number of children: Not on file   Years of education: Not on file   Highest education level: Not on file  Occupational History   Not on file  Tobacco Use   Smoking status: Every Day    Current packs/day: 0.00    Types: Cigarettes    Start date: 03/13/1992    Last attempt to quit: 03/13/2022    Years since quitting: 1.9   Smokeless tobacco: Never   Tobacco comments:    Has been smoking for 30 years Wants to quit after she gets past all the heart stuff and changes in life.  Smoking 3 a day right now  Vaping Use   Vaping status: Never Used  Substance and Sexual Activity   Alcohol use: No   Drug use: Never   Sexual activity: Not Currently  Other Topics Concern   Not on file  Social History Narrative   Lives with fiance, Pervis.   Social Drivers of Corporate investment banker Strain: Low Risk  (09/17/2023)   Received from [facility]   Overall Financial Resource Strain (CARDIA)    Difficulty of Paying Living Expenses: Not hard at all  Food Insecurity: No Food Insecurity (09/17/2023)   Received from [facility]   Hunger Vital Sign    Within the past 12 months, you worried that your food would run out before you got the money to buy more.: Never true    Within the past 12 months, the food you bought just didn't last and you didn't have money to get more.: Never true  Transportation Needs: No Transportation Needs (09/17/2023)   Received from [facility] - Transportation    In the past 12 months, has lack of transportation kept you from medical appointments or from getting medications?: No    Lack of Transportation (Non-Medical): No  Physical Activity: Inactive (03/26/2023)   Received from [facility]   Exercise Vital Sign    On average, how many days per week do you engage in moderate to strenuous exercise (like a brisk walk)?: 0 days    On average,  how many minutes do you engage in exercise at this level?: 0 min  Stress: Stress Concern Present (03/26/2023)   Received from [facility] - Occupational Stress Questionnaire    Feeling of Stress : Very much  Social Connections: Socially Isolated (03/26/2023)   Received from [facility]   Social Connection and Isolation Panel    In a typical week, how many times do you talk on the phone with family, friends, or neighbors?: More than three times a week    How often do you get together with friends or relatives?: More than three times a week    How often do you attend church or religious services?: Never    Do you belong to any clubs or organizations such as church groups, unions, fraternal or athletic groups, or school groups?: No    How often do you attend meetings of the clubs or organizations you belong to?: Never    Are you married, widowed, divorced, separated, never married, or living with a partner?: Never married     Family History:  The patient's family history includes Breast cancer in her maternal grandmother; Cancer in her father and paternal grandfather; Depression in her mother; Hypertension in her sister.  ROS:   12-point review of systems is negative unless otherwise noted in the HPI.  EKGs/Other Studies Reviewed:    Studies reviewed were summarized above. The additional studies were reviewed today:  02/05/2024 Echo complete 1. Left ventricular ejection fraction, by estimation, is 45 to 50%. The left ventricle has mildly decreased function. The left ventricle demonstrates regional wall motion abnormalities (see scoring diagram/findings for description). There is mild left ventricular hypertrophy. Left ventricular diastolic parameters are consistent with Grade I diastolic dysfunction (impaired relaxation).   2. Right ventricular systolic function is low normal. The right ventricular size is normal.    3. The mitral valve is normal in structure. Trivial mitral valve regurgitation. No evidence of mitral stenosis.   4. The aortic valve was not well visualized. Aortic valve regurgitation is not visualized. No aortic stenosis is present.   5. The inferior vena cava is normal in size with greater than 50% respiratory variability, suggesting right atrial pressure of 3 mmHg.   03/16/2023 Echo complete 1. Left ventricular ejection fraction, by estimation, is 40 to 45%. Left ventricular ejection fraction by PLAX is 44 %. The left ventricle has mild to moderately decreased function. The left ventricle demonstrates global  hypokinesis. Left ventricular diastolic parameters are consistent with Grade I diastolic dysfunction  (impaired relaxation).   2. Right ventricular systolic function is normal. The right ventricular size is normal.   3. The mitral valve is normal in structure. No evidence of mitral valve regurgitation.   4. The aortic valve was not well visualized. Aortic valve regurgitation is not visualized.   EKG:  EKG personally reviewed by me today EKG Interpretation Date/Time:  Wednesday February 13 2024 10:47:03 EDT Ventricular Rate:  93 PR Interval:  184 QRS Duration:  80 QT Interval:  384 QTC Calculation: 477 R Axis:   -7  Text Interpretation: Normal sinus rhythm Low voltage QRS Possible Inferior infarct (cited on or before 06-Jun-2023) When compared with ECG of 29-Nov-2023 08:43, Premature ventricular complexes are no longer Present Confirmed by Lorene Sinclair ([id number]) on 02/13/2024 10:52:23 AM  PHYSICAL EXAM:    VS:  BP 100/70 (BP Location: Left Arm, Patient Position: Sitting, Cuff Size: Large)   Pulse 93   Ht 5' 1 (1.549 m)   Wt 183 lb 2 oz (83.1 kg)   SpO2 96%   BMI 34.60 kg/m   BMI: Body mass index is 34.6 kg/m.  GEN: Well nourished, well developed in no acute distress NECK: No JVD; No carotid  bruits CARDIAC: RRR, no murmurs, rubs, gallops RESPIRATORY:  Clear to  auscultation without rales, wheezing or rhonchi  ABDOMEN: Soft, non-tender, non-distended EXTREMITIES: No edema; No deformity  Wt Readings from Last 3 Encounters:  02/13/24 183 lb 2 oz (83.1 kg)  11/29/23 182 lb (82.6 kg)  11/25/23 182 lb (82.6 kg)                  ASSESSMENT & PLAN:   Chronic systolic heart failure Ischemic cardiomyopathy - Echo 03/2023 showed EF 40 to 45%.  Repeat echo 01/2024 with improvement in EF to 45 to 50% with mild LVH and G1 DD.  She appears euvolemic and well compensated on Lasix  40 mg daily.  She is continued on metoprolol  succinate 50 mg daily and spironolactone  25 mg daily.  Low BP limits further titration of GDMT.  Coronary artery disease - Cardiac cath 06/2022 showed patent RCA stents with otherwise nonobstructive disease.  Patient reports intermittent chest pressure with exertion and at rest.  Recommend proceeding with Lexiscan Myoview to rule out high risk ischemia.  She should continue Ranexa  1000 mg twice daily, aspirin  81 mg daily and Effient  10 mg daily. She was an intermediate responder to clopidogrel  by genotype.   Paroxysmal SVT - Well-controlled on metoprolol  succinate.  Hyperlipidemia - Most recent lipid panel with LDL 83, goal less than 55.  She is continued on rosuvastatin  40 mg daily.  Consider addition of ezetimibe at follow-up.  Tobacco use - Recommend cessation.  Obesity - Encouraged healthy lifestyle modifications.  She is not interested in assistance with GLP-1.  However, her insurance denied coverage of Wegovy .  Informed Consent   Shared Decision Making/Informed Consent The risks [chest pain, shortness of breath, cardiac arrhythmias, dizziness, blood pressure fluctuations, myocardial infarction, stroke/transient ischemic attack, nausea, vomiting, allergic reaction, radiation exposure, metallic taste sensation and life-threatening complications (estimated to be 1 in 10,000)], benefits (risk stratification, diagnosing coronary  artery disease, treatment guidance) and alternatives of a nuclear stress test were discussed in detail with Ms. Behanna and she agrees to proceed.      Disposition: Obtain Lexiscan Myoview. F/u with Dr. Darron or an APP in 2-3 months.   Medication Adjustments/Labs and Tests Ordered: Current medicines are reviewed at length with the patient today.  Concerns regarding medicines are outlined above. Medication changes, Labs and Tests ordered today are summarized above and listed in the Patient Instructions accessible in Encounters.   Signed, Lesley Maffucci, PA-C 02/13/2024 1:28 PM     [facility] [place] [address] [place], [place] [address] [phone]

## 2024-02-19 ENCOUNTER — Encounter: Admission: RE | Admit: 2024-02-19 | Source: Ambulatory Visit

## 2024-02-19 ENCOUNTER — Encounter

## 2024-02-19 ENCOUNTER — Other Ambulatory Visit

## 2024-02-19 ENCOUNTER — Other Ambulatory Visit: Payer: Self-pay

## 2024-02-19 MED ORDER — ALPRAZOLAM 0.5 MG PO TABS
0.5000 mg | ORAL_TABLET | Freq: Three times a day (TID) | ORAL | 0 refills | Status: DC | PRN
  Filled 2024-02-19 – 2024-03-14 (×2): qty 90, 30d supply, fill #0

## 2024-02-26 ENCOUNTER — Other Ambulatory Visit

## 2024-02-26 ENCOUNTER — Ambulatory Visit: Attending: Cardiology | Admitting: Cardiology

## 2024-02-26 ENCOUNTER — Encounter: Admission: RE | Admit: 2024-02-26 | Source: Ambulatory Visit

## 2024-02-26 NOTE — Progress Notes (Deleted)
  Cardiology Office Note   Date:  02/26/2024  ID:  Lisa Davila, DOB [birth year], MRN [id number] PCP: Sadie Manna, MD  [facility] Providers Cardiologist:  Deatrice Cage, MD { Click to update primary MD,subspecialty MD or APP then REFRESH:1}    History of Present Illness Lisa Davila is a 48 y.o. female with past medical history of coronary artery disease status post ST elevated myocardial infarction status post PCI/DES to the RCA (06/2018), s/p mechanical pulm thrombectomy and DES placement (03/2022) for late stent thrombosis, HFrEF, PSVT, status post prior EP study with no injectable  ROS: ***  Studies Reviewed      *** Risk Assessment/Calculations {Does this patient have ATRIAL FIBRILLATION?:[phone]} No BP recorded.  {Refresh Note OR Click here to enter BP  :1}***       Physical Exam VS:  There were no vitals taken for this visit.       Wt Readings from Last 3 Encounters:  02/13/24 183 lb 2 oz (83.1 kg)  11/29/23 182 lb (82.6 kg)  11/25/23 182 lb (82.6 kg)    GEN: Well nourished, well developed in no acute distress NECK: No JVD; No carotid bruits CARDIAC: ***RRR, no murmurs, rubs, gallops RESPIRATORY:  Clear to auscultation without rales, wheezing or rhonchi  ABDOMEN: Soft, non-tender, non-distended EXTREMITIES:  No edema; No deformity   ASSESSMENT AND PLAN ***    {Are you ordering a CV Procedure (e.g. stress test, cath, DCCV, TEE, etc)?   Press F2        :[id number]}  Dispo: ***  Signed, Janel Beane, NP

## 2024-02-29 ENCOUNTER — Other Ambulatory Visit: Payer: Self-pay

## 2024-02-29 ENCOUNTER — Encounter: Payer: Self-pay | Admitting: Cardiology

## 2024-03-04 ENCOUNTER — Telehealth: Payer: Self-pay | Admitting: Pharmacy Technician

## 2024-03-04 ENCOUNTER — Other Ambulatory Visit: Payer: Self-pay | Admitting: Cardiology

## 2024-03-04 ENCOUNTER — Other Ambulatory Visit: Payer: Self-pay

## 2024-03-04 ENCOUNTER — Other Ambulatory Visit (HOSPITAL_COMMUNITY): Payer: Self-pay

## 2024-03-04 MED ORDER — POTASSIUM CHLORIDE ER 20 MEQ PO TBCR
80.0000 meq | EXTENDED_RELEASE_TABLET | Freq: Every day | ORAL | 3 refills | Status: AC
  Filled 2024-03-04: qty 360, 90d supply, fill #0
  Filled 2024-05-31: qty 360, 90d supply, fill #1
  Filled 2024-06-01: qty 360, 90d supply, fill #0

## 2024-03-04 MED ORDER — AZITHROMYCIN 250 MG PO TABS
ORAL_TABLET | ORAL | 0 refills | Status: AC
  Filled 2024-03-04: qty 6, 5d supply, fill #0

## 2024-03-04 MED ORDER — ROSUVASTATIN CALCIUM 40 MG PO TABS
40.0000 mg | ORAL_TABLET | Freq: Every day | ORAL | 3 refills | Status: DC
  Filled 2024-03-04: qty 90, 90d supply, fill #0

## 2024-03-04 MED ORDER — FUROSEMIDE 40 MG PO TABS
40.0000 mg | ORAL_TABLET | Freq: Every day | ORAL | 3 refills | Status: AC
  Filled 2024-03-04: qty 90, 90d supply, fill #0
  Filled 2024-05-31: qty 90, 90d supply, fill #1
  Filled 2024-06-01: qty 90, 90d supply, fill #0

## 2024-03-04 MED ORDER — METOPROLOL SUCCINATE ER 50 MG PO TB24
50.0000 mg | ORAL_TABLET | Freq: Every day | ORAL | 3 refills | Status: AC
  Filled 2024-03-04: qty 90, 90d supply, fill #0
  Filled 2024-05-31: qty 90, 90d supply, fill #1
  Filled 2024-06-01: qty 90, 90d supply, fill #0

## 2024-03-04 MED ORDER — SPIRONOLACTONE 25 MG PO TABS
25.0000 mg | ORAL_TABLET | Freq: Every day | ORAL | 3 refills | Status: AC
  Filled 2024-03-04: qty 90, 90d supply, fill #0
  Filled 2024-05-31: qty 90, 90d supply, fill #1
  Filled 2024-06-01: qty 90, 90d supply, fill #0

## 2024-03-04 MED ORDER — REPATHA SURECLICK 140 MG/ML ~~LOC~~ SOAJ
140.0000 mg | SUBCUTANEOUS | 3 refills | Status: AC
  Filled 2024-03-04: qty 2, 28d supply, fill #0
  Filled 2024-03-11 – 2024-03-17 (×2): qty 6, 84d supply, fill #0

## 2024-03-04 MED ORDER — PREDNISONE 20 MG PO TABS
ORAL_TABLET | ORAL | 0 refills | Status: DC
  Filled 2024-03-04: qty 9, 6d supply, fill #0

## 2024-03-04 MED ORDER — PRASUGREL HCL 10 MG PO TABS
10.0000 mg | ORAL_TABLET | Freq: Every day | ORAL | 3 refills | Status: AC
  Filled 2024-03-04 – 2024-03-11 (×2): qty 90, 90d supply, fill #0
  Filled 2024-05-31: qty 90, 90d supply, fill #1
  Filled 2024-06-01: qty 90, 90d supply, fill #0
  Filled 2024-06-03: qty 90, 90d supply, fill #1

## 2024-03-04 MED ORDER — HYDROCOD POLI-CHLORPHE POLI ER 10-8 MG/5ML PO SUER
5.0000 mL | Freq: Two times a day (BID) | ORAL | 0 refills | Status: AC | PRN
  Filled 2024-03-04: qty 120, 12d supply, fill #0

## 2024-03-04 NOTE — Telephone Encounter (Signed)
    Pharmacy Patient Advocate Encounter   Received notification from Onbase that prior authorization for repatha  is required/requested.   Insurance verification completed.   The patient is insured through [facility] MEDICAID.   Per test claim: PA required; PA submitted to above mentioned insurance via Latent Key/confirmation #/EOC [facility] Status is pending

## 2024-03-04 NOTE — Telephone Encounter (Signed)
   Pharmacy Patient Advocate Encounter   Received notification from pharmacy that prior authorization for repatha  is required/requested.   Insurance verification completed.   The patient is insured through [facility].   Per test claim: PA required; PA submitted to above mentioned insurance via Latent Key/confirmation #/EOC [id number] Status is pending

## 2024-03-05 NOTE — Telephone Encounter (Signed)
 Pharmacy Patient Advocate Encounter  Received notification from [facility] that Prior Authorization for repatha   has been APPROVED from 03/04/24 to 03/04/27   PA #/Case ID/Reference #: [id number]

## 2024-03-06 ENCOUNTER — Other Ambulatory Visit: Payer: Self-pay

## 2024-03-06 MED ORDER — AMOXICILLIN-POT CLAVULANATE 875-125 MG PO TABS
875.0000 mg | ORAL_TABLET | Freq: Two times a day (BID) | ORAL | 0 refills | Status: AC
  Filled 2024-03-06: qty 14, 7d supply, fill #0

## 2024-03-06 MED ORDER — PREDNISONE 10 MG PO TABS
ORAL_TABLET | ORAL | 0 refills | Status: DC
  Filled 2024-03-06: qty 42, 12d supply, fill #0

## 2024-03-06 MED ORDER — GUAIFENESIN-CODEINE 100-10 MG/5ML PO SOLN
5.0000 mL | Freq: Four times a day (QID) | ORAL | 0 refills | Status: DC | PRN
  Filled 2024-03-06: qty 70, 4d supply, fill #0

## 2024-03-06 MED ORDER — IPRATROPIUM-ALBUTEROL 0.5-2.5 (3) MG/3ML IN SOLN
3.0000 mL | Freq: Four times a day (QID) | RESPIRATORY_TRACT | 0 refills | Status: AC
  Filled 2024-03-06: qty 360, 30d supply, fill #0

## 2024-03-06 NOTE — Telephone Encounter (Signed)
 The Breztri can be used as a daily inhaler for the COPD, it is not a rescue inhaler to be taken several times like albuterol .  With heart failure it is safe to take and just requires a little bit more monitoring.  Only use it as it was prescribed for your COPD.  Make sure to rinse your mouth after using it and monitor your heart rate as it can cause some slight elevation in your heart rate and in your blood pressure.  If you notice an increase in your heart rate and your blood pressure that is sustained after using the inhaler we will have to reach out to the prescriber to have it changed to a different inhaler for your COPD. I hope this helps and let us  know if you need anything else.

## 2024-03-11 ENCOUNTER — Other Ambulatory Visit: Payer: Self-pay | Admitting: Cardiology

## 2024-03-11 ENCOUNTER — Other Ambulatory Visit (HOSPITAL_COMMUNITY): Payer: Self-pay

## 2024-03-11 ENCOUNTER — Other Ambulatory Visit: Payer: Self-pay

## 2024-03-11 ENCOUNTER — Telehealth: Payer: Self-pay | Admitting: Pharmacy Technician

## 2024-03-11 NOTE — Telephone Encounter (Signed)
    Test claim wouldn't go through due to evoucher. Sent message to the pharmacy

## 2024-03-11 NOTE — Telephone Encounter (Signed)
 Rx 4.00

## 2024-03-11 NOTE — Telephone Encounter (Signed)
   Pharmacy Patient Advocate Encounter   Received notification from CoverMyMeds that prior authorization for repatha  is required/requested.   Insurance verification completed.   The patient is insured through [facility] MEDICAID.   Per test claim: PA required; PA submitted to above mentioned insurance via Latent Key/confirmation #/EOC [id number] Status is pending

## 2024-03-12 ENCOUNTER — Other Ambulatory Visit: Payer: Self-pay

## 2024-03-13 ENCOUNTER — Other Ambulatory Visit: Payer: Self-pay

## 2024-03-14 ENCOUNTER — Other Ambulatory Visit: Payer: Self-pay

## 2024-03-17 ENCOUNTER — Other Ambulatory Visit: Payer: Self-pay

## 2024-03-25 ENCOUNTER — Other Ambulatory Visit: Payer: Self-pay

## 2024-03-25 ENCOUNTER — Emergency Department
Admission: EM | Admit: 2024-03-25 | Discharge: 2024-03-25 | Disposition: A | Discharge status: Home / Self Care | Attending: Emergency Medicine | Admitting: Emergency Medicine

## 2024-03-25 ENCOUNTER — Encounter: Payer: Self-pay | Admitting: Cardiovascular Disease

## 2024-03-25 DIAGNOSIS — R252 Cramp and spasm: Secondary | ICD-10-CM | POA: Insufficient documentation

## 2024-03-25 DIAGNOSIS — I2510 Atherosclerotic heart disease of native coronary artery without angina pectoris: Secondary | ICD-10-CM | POA: Insufficient documentation

## 2024-03-25 DIAGNOSIS — M79606 Pain in leg, unspecified: Secondary | ICD-10-CM

## 2024-03-25 LAB — BASIC METABOLIC PANEL WITH GFR
Anion gap: 8 (ref 5–15)
BUN: 8 mg/dL (ref 6–20)
CO2: 27 mmol/L (ref 22–32)
Calcium: 9.4 mg/dL (ref 8.9–10.3)
Chloride: 103 mmol/L (ref 98–111)
Creatinine, Ser: 0.91 mg/dL (ref 0.44–1.00)
GFR, Estimated: >60 mL/min (ref >60)
Glucose, Bld: 109 mg/dL — ABNORMAL HIGH (ref 70–99)
Potassium: 4.4 mmol/L (ref 3.5–5.1)
Sodium: 138 mmol/L (ref 135–145)

## 2024-03-25 LAB — CBC WITH DIFFERENTIAL/PLATELET
Abs Immature Granulocytes: 0.02 K/uL (ref 0.00–0.07)
Basophils Absolute: 0.1 K/uL (ref 0.0–0.1)
Basophils Relative: 1 %
Eosinophils Absolute: 0.2 K/uL (ref 0.0–0.5)
Eosinophils Relative: 2 %
HCT: 48.9 % — ABNORMAL HIGH (ref 36.0–46.0)
Hemoglobin: 16.1 g/dL — ABNORMAL HIGH (ref 12.0–15.0)
Immature Granulocytes: 0 %
Lymphocytes Relative: 42 %
Lymphs Abs: 3.8 K/uL (ref 0.7–4.0)
MCH: 31.3 pg (ref 26.0–34.0)
MCHC: 32.9 g/dL (ref 30.0–36.0)
MCV: 95.1 fL (ref 80.0–100.0)
Monocytes Absolute: 0.6 K/uL (ref 0.1–1.0)
Monocytes Relative: 6 %
Neutro Abs: 4.4 K/uL (ref 1.7–7.7)
Neutrophils Relative %: 49 %
Platelets: 280 K/uL (ref 150–400)
RBC: 5.14 MIL/uL — ABNORMAL HIGH (ref 3.87–5.11)
RDW: 12.5 % (ref 11.5–15.5)
WBC: 8.9 K/uL (ref 4.0–10.5)
nRBC: 0 % (ref 0.0–0.2)

## 2024-03-25 LAB — CK: Total CK: 160 U/L (ref 38–234)

## 2024-03-25 LAB — MAGNESIUM: Magnesium: 2.1 mg/dL (ref 1.7–2.4)

## 2024-03-25 MED ORDER — CYCLOBENZAPRINE HCL 5 MG PO TABS
5.0000 mg | ORAL_TABLET | Freq: Three times a day (TID) | ORAL | 0 refills | Status: AC | PRN
  Filled 2024-03-25: qty 30, 10d supply, fill #0

## 2024-03-25 NOTE — ED Notes (Signed)
 Pt given DC instructions. Pt verbalized understanding of medications and follow up care. Pt ambulatory from ED without difficulty.

## 2024-03-25 NOTE — ED Triage Notes (Addendum)
 Pt reports bilateral leg cramps for the past 4 days, pt reports she has hx of hypokalemia and has had leg cramps at that time also. Pt denies chest pain or palpations. Pt reports last week she didn't take her potassium supplement due to being sick.

## 2024-03-25 NOTE — ED Provider Notes (Signed)
 [facility] Provider Note    Event Date/Time   First MD Initiated Contact with Patient 03/25/24 0703     (approximate)   History   Leg Pain   HPI  Lisa Davila is a 48 y.o. female past medical history significant for CAD, hyperlipidemia, GERD, anxiety, depression, presents to the emergency department with leg cramping.  Patient states that she has had intermittent cramping in the past but it has been more severe over the past couple of days.  States that it is keeping her up from sleeping.  Also had some abdominal cramping.  Denies any chest pain or shortness of breath.  Denies nausea, vomiting or diarrhea.  No recent changes of home medications.  Does take Crestor .  No recent changes of those medications.  States that she was concerned that she had low potassium level since her potassium has been low in the past.     Physical Exam   Triage Vital Signs: ED Triage Vitals  Encounter Vitals Group     BP 03/25/24 0638 121/80     Girls Systolic BP Percentile --      Girls Diastolic BP Percentile --      Boys Systolic BP Percentile --      Boys Diastolic BP Percentile --      Pulse Rate 03/25/24 0638 86     Resp 03/25/24 0638 19     Temp 03/25/24 0638 97.8 F (36.6 C)     Temp src --      SpO2 03/25/24 0638 100 %     Weight 03/25/24 0637 180 lb (81.6 kg)     Height 03/25/24 0637 5' 1 (1.549 m)     Head Circumference --      Peak Flow --      Pain Score 03/25/24 0637 0     Pain Loc --      Pain Education --      Exclude from Growth Chart --     Most recent vital signs: Vitals:   03/25/24 0638  BP: 121/80  Pulse: 86  Resp: 19  Temp: 97.8 F (36.6 C)  SpO2: 100%    Physical Exam Constitutional:      Appearance: She is well-developed.  HENT:     Head: Atraumatic.  Eyes:     Conjunctiva/sclera: Conjunctivae normal.  Cardiovascular:     Rate and Rhythm: Regular rhythm.  Pulmonary:     Effort: No respiratory distress.  Abdominal:      General: There is no distension.  Musculoskeletal:        General: Normal range of motion.     Cervical back: Normal range of motion.     Right lower leg: No edema.     Left lower leg: No edema.  Skin:    General: Skin is warm.     Capillary Refill: Capillary refill takes less than 2 seconds.  Neurological:     Mental Status: She is alert. Mental status is at baseline.     IMPRESSION / MDM / ASSESSMENT AND PLAN / ED COURSE  I reviewed the triage vital signs and the nursing notes.  Differential diagnosis including side effects of statin, rhabdomyolysis, electrolyte abnormality, dehydration  (all labs ordered are listed, but only abnormal results are displayed) Labs interpreted as -    Labs Reviewed  CBC WITH DIFFERENTIAL/PLATELET - Abnormal; Notable for the following components:      Result Value   RBC 5.14 (*)  Hemoglobin 16.1 (*)    HCT 48.9 (*)    All other components within normal limits  BASIC METABOLIC PANEL WITH GFR - Abnormal; Notable for the following components:   Glucose, Bld 109 (*)    All other components within normal limits  MAGNESIUM   CK     MDM Lab work overall reassuring with no signs of electrolyte abnormality.  No signs of rhabdomyolysis.  Does not appear clinically dehydrated.  Good peripheral pulses on exam.  No signs of lower extremity DVT.  Possible side effect of her statin.  Discussed hydration and will give a short course of muscle relaxer.  Discussed close follow-up with primary care physician.  Discussed foods that are high in potassium and magnesium .  No questions at time of discharge.      PROCEDURES:  Critical Care performed: No  Procedures  Patient's presentation is most consistent with acute complicated illness / injury requiring diagnostic workup.   MEDICATIONS ORDERED IN ED: Medications - No data to display  FINAL CLINICAL IMPRESSION(S) / ED DIAGNOSES   Final diagnoses:  Muscle cramps     Rx / DC Orders   ED  Discharge Orders          Ordered    cyclobenzaprine  (FLEXERIL ) 5 MG tablet  3 times daily PRN        03/25/24 0726             Note:  This document was prepared using Dragon voice recognition software and may include unintentional dictation errors.   Suzanne Kirsch, MD 03/25/24 [phone]

## 2024-03-25 NOTE — Discharge Instructions (Addendum)
 You were seen in the emergency department for muscle cramping and spasms.  Your potassium level was normal at 4.4 and her magnesium  level was normal at 2.1.  You could be having side effects of your Crestor  which is a statin medication.  It is importantly stay hydrated and drink plenty of fluids.  Eat foods that are high in potassium and magnesium .  Follow-up closely with your primary care physician and return to the emergency department for any ongoing or worsening symptoms.  You can take Tylenol  as needed for muscle cramps.  You were given a prescription for low-dose muscle relaxer to take as needed for back muscle spasms.   Make sure to eat food high in potassium and magnesium  - examples - potatoes, spinach, bananas, beans, avocadoes, oranges, nuts.  Cyclbenzoprine (Flexeril ) - You can take 1/2 to 1 tablet (5-10 mg) every 8 hours as needed for muscle strain/spasm.  Do not drive or work on this medication.  This medication can cause you to feel tired.

## 2024-04-02 ENCOUNTER — Ambulatory Visit (HOSPITAL_COMMUNITY): Admission: RE | Admit: 2024-04-02 | Source: Ambulatory Visit

## 2024-04-07 ENCOUNTER — Other Ambulatory Visit: Payer: Self-pay

## 2024-04-08 ENCOUNTER — Ambulatory Visit: Attending: Cardiovascular Disease

## 2024-04-08 ENCOUNTER — Other Ambulatory Visit: Payer: Self-pay | Admitting: Cardiology

## 2024-04-08 DIAGNOSIS — M79606 Pain in leg, unspecified: Secondary | ICD-10-CM

## 2024-04-08 DIAGNOSIS — M79604 Pain in right leg: Secondary | ICD-10-CM

## 2024-04-08 DIAGNOSIS — M79605 Pain in left leg: Secondary | ICD-10-CM

## 2024-04-09 ENCOUNTER — Ambulatory Visit

## 2024-04-10 LAB — VAS US ABI WITH/WO TBI
Left ABI: 1.01
Right ABI: 0.96

## 2024-04-11 ENCOUNTER — Ambulatory Visit: Payer: Self-pay | Admitting: Cardiovascular Disease

## 2024-04-15 ENCOUNTER — Ambulatory Visit: Admitting: Cardiovascular Disease

## 2024-04-15 ENCOUNTER — Encounter

## 2024-04-22 ENCOUNTER — Inpatient Hospital Stay: Admission: RE | Admit: 2024-04-22 | Discharge: 2024-04-22 | Attending: Physician Assistant

## 2024-04-22 DIAGNOSIS — R072 Precordial pain: Secondary | ICD-10-CM

## 2024-04-25 ENCOUNTER — Other Ambulatory Visit: Payer: Self-pay

## 2024-04-25 MED ORDER — ALPRAZOLAM 0.5 MG PO TABS
0.5000 mg | ORAL_TABLET | Freq: Three times a day (TID) | ORAL | 0 refills | Status: DC | PRN
  Filled 2024-04-25 (×2): qty 90, 30d supply, fill #0

## 2024-04-29 ENCOUNTER — Encounter

## 2024-05-02 ENCOUNTER — Ambulatory Visit: Admission: RE | Admit: 2024-05-02 | Source: Ambulatory Visit

## 2024-05-06 ENCOUNTER — Encounter: Payer: Self-pay | Admitting: Emergency Medicine

## 2024-05-06 ENCOUNTER — Emergency Department
Admission: EM | Admit: 2024-05-06 | Discharge: 2024-05-06 | Disposition: A | Discharge status: Home / Self Care | Attending: Emergency Medicine | Admitting: Emergency Medicine

## 2024-05-06 ENCOUNTER — Other Ambulatory Visit: Payer: Self-pay

## 2024-05-06 ENCOUNTER — Encounter: Payer: Self-pay | Admitting: Cardiovascular Disease

## 2024-05-06 ENCOUNTER — Emergency Department

## 2024-05-06 DIAGNOSIS — R9431 Abnormal electrocardiogram [ECG] [EKG]: Secondary | ICD-10-CM

## 2024-05-06 DIAGNOSIS — I4581 Long QT syndrome: Secondary | ICD-10-CM | POA: Insufficient documentation

## 2024-05-06 DIAGNOSIS — J069 Acute upper respiratory infection, unspecified: Secondary | ICD-10-CM | POA: Insufficient documentation

## 2024-05-06 DIAGNOSIS — E876 Hypokalemia: Secondary | ICD-10-CM | POA: Insufficient documentation

## 2024-05-06 DIAGNOSIS — R059 Cough, unspecified: Secondary | ICD-10-CM | POA: Diagnosis present

## 2024-05-06 LAB — BASIC METABOLIC PANEL WITH GFR
Anion gap: 12 (ref 5–15)
BUN: <5 mg/dL — ABNORMAL LOW (ref 6–20)
CO2: 26 mmol/L (ref 22–32)
Calcium: 9.5 mg/dL (ref 8.9–10.3)
Chloride: 101 mmol/L (ref 98–111)
Creatinine, Ser: 0.92 mg/dL (ref 0.44–1.00)
GFR, Estimated: >60 mL/min
Glucose, Bld: 113 mg/dL — ABNORMAL HIGH (ref 70–99)
Potassium: 3.1 mmol/L — ABNORMAL LOW (ref 3.5–5.1)
Sodium: 139 mmol/L (ref 135–145)

## 2024-05-06 LAB — CBC
HCT: 47.5 % — ABNORMAL HIGH (ref 36.0–46.0)
Hemoglobin: 15.7 g/dL — ABNORMAL HIGH (ref 12.0–15.0)
MCH: 31.7 pg (ref 26.0–34.0)
MCHC: 33.1 g/dL (ref 30.0–36.0)
MCV: 95.8 fL (ref 80.0–100.0)
Platelets: 325 K/uL (ref 150–400)
RBC: 4.96 MIL/uL (ref 3.87–5.11)
RDW: 13.2 % (ref 11.5–15.5)
WBC: 8.7 K/uL (ref 4.0–10.5)
nRBC: 0 % (ref 0.0–0.2)

## 2024-05-06 LAB — TROPONIN T, HIGH SENSITIVITY: Troponin T High Sensitivity: <15 ng/L (ref 0–19)

## 2024-05-06 NOTE — ED Provider Notes (Signed)
 "  [facility] Provider Note    Event Date/Time   First MD Initiated Contact with Patient 05/06/24 0820     (approximate)   History   URI   HPI  Lisa Davila is a 48 y.o. female presents to the emergency department with not feeling well.  States that she started to not feel well on the 24th.  States that since that time she has had ongoing symptoms and a cough that has not resolved which brought her into the emergency department.  States that she has been having bodyaches, chills, cough and some chest tightness.  States that she has been having a lot of congestion and not feeling well.  Denies any significant fever.  No significant shortness of breath.  Denies nausea, vomiting or diarrhea.     Physical Exam   Triage Vital Signs: ED Triage Vitals  Encounter Vitals Group     BP 05/06/24 0802 123/73     Girls Systolic BP Percentile --      Girls Diastolic BP Percentile --      Boys Systolic BP Percentile --      Boys Diastolic BP Percentile --      Pulse Rate 05/06/24 0802 95     Resp 05/06/24 0802 16     Temp 05/06/24 0802 97.7 F (36.5 C)     Temp Source 05/06/24 0802 Oral     SpO2 05/06/24 0802 100 %     Weight 05/06/24 0803 179 lb 14.3 oz (81.6 kg)     Height 05/06/24 0821 5' 1 (1.549 m)     Head Circumference --      Peak Flow --      Pain Score 05/06/24 0802 7     Pain Loc --      Pain Education --      Exclude from Growth Chart --     Most recent vital signs: Vitals:   05/06/24 0802  BP: 123/73  Pulse: 95  Resp: 16  Temp: 97.7 F (36.5 C)  SpO2: 100%    Physical Exam Constitutional:      Appearance: She is well-developed.  HENT:     Head: Atraumatic.     Mouth/Throat:     Mouth: Mucous membranes are moist.  Eyes:     Conjunctiva/sclera: Conjunctivae normal.     Pupils: Pupils are equal, round, and reactive to light.  Cardiovascular:     Rate and Rhythm: Regular rhythm.  Pulmonary:     Effort: Pulmonary effort is  normal. No respiratory distress.     Breath sounds: Normal breath sounds. No wheezing.  Abdominal:     General: There is no distension.     Tenderness: There is no abdominal tenderness.  Musculoskeletal:        General: Normal range of motion.     Cervical back: Normal range of motion.  Skin:    General: Skin is warm.     Capillary Refill: Capillary refill takes less than 2 seconds.  Neurological:     Mental Status: She is alert. Mental status is at baseline.  Psychiatric:        Mood and Affect: Mood normal.     IMPRESSION / MDM / ASSESSMENT AND PLAN / ED COURSE  I reviewed the triage vital signs and the nursing notes.  Differential diagnosis including viral illness including COVID/influenza, pneumonia, electrolyte abnormality, dehydration, ACS   EKG  I, Clotilda Punter, the attending physician, personally viewed and interpreted  this ECG.   Rate: Normal  Rhythm: Normal sinus  Axis: Normal  Intervals: Normal  ST&T Change: None  LABS (all labs ordered are listed, but only abnormal results are displayed) Labs interpreted as -    Labs Reviewed  BASIC METABOLIC PANEL WITH GFR - Abnormal; Notable for the following components:      Result Value   Potassium 3.1 (*)    Glucose, Bld 113 (*)    BUN <5 (*)    All other components within normal limits  CBC - Abnormal; Notable for the following components:   Hemoglobin 15.7 (*)    HCT 47.5 (*)    All other components within normal limits  TROPONIN T, HIGH SENSITIVITY     MDM  Patient most likely with a viral illness.  Currently influenza testing and COVID testing are significantly limited given shortage of supplies and a positive test rate of approximately 50% of influenza.  Patient most likely with influenza-like illness.  Given that her symptoms have been ongoing for the past 5 days do not feel that Tamiflu  would be beneficial to the patient.  I have a low suspicion for ACS, troponin is undetectable and atypical chest pain.   Mild hypokalemia.  No other significant electrolyte abnormality.  Chest x-ray with no signs of pneumonia.  PERC negative and have low suspicion for pulmonary embolism.  Discussed symptomatic treatment, discussed return precautions for any ongoing or worsening symptoms.  Discussed close follow-up with primary care.  No questions or concerns at time of discharge.     PROCEDURES:  Critical Care performed: No  Procedures  Patient's presentation is most consistent with acute presentation with potential threat to life or bodily function.   MEDICATIONS ORDERED IN ED: Medications - No data to display  FINAL CLINICAL IMPRESSION(S) / ED DIAGNOSES   Final diagnoses:  Upper respiratory tract infection, unspecified type  Prolonged Q-T interval on ECG  Hypokalemia     Rx / DC Orders   ED Discharge Orders     None        Note:  This document was prepared using Dragon voice recognition software and may include unintentional dictation errors.   Suzanne Kirsch, MD 05/06/24 1818  "

## 2024-05-06 NOTE — Discharge Instructions (Addendum)
 You were seen in the emergency department for flulike illness.  You mostly have a viral illness including COVID or influenza.  It is important that you stay hydrated and drink plenty of fluids.  Use Pedialyte to help with some electrolytes.  Your potassium level was mildly low today at 3.1.  It is important that you take your potassium tablets of 40 mill equivalents when you get home.  Stop taking your over-the-counter medications other than Motrin , Tylenol  or hot tea and honey for your cough.  Follow-up closely with your primary care physician to have a repeat EKG and make sure that you do not need to change any of your long-term home medications.  Return to the emergency department for any ongoing or worsening symptoms.

## 2024-05-06 NOTE — ED Triage Notes (Signed)
 States was exposed to COVID and Flu the day before Christmas.  Presents today with C/O cough, sinus congestion and not feeling well since Saturday.  Also some chest tightness.  AAOx3. Skin warm and dry. Deep cough appreciated. No SOB/ DOE. NAD

## 2024-05-06 NOTE — ED Notes (Signed)
 See triage note  Presents with cough and body aches   States sxs' started couple of days ago  Has had exposure to the flu and COVID  Afebrile on arrival  Also having some chest tightness

## 2024-05-07 NOTE — Progress Notes (Unsigned)
 "  Cardiology Office Note    Date:  05/07/2024   ID:  Lisa Davila, DOB October 13, 1975, MRN [id number]  PCP:  Sadie Manna, MD  Cardiologist:  Deatrice Cage, MD  Electrophysiologist:  None   Chief Complaint: ***  History of Present Illness:   Lisa Davila is a 49 y.o. female with history of CAD s/p PCI/DES to RCA 06/2018 and mechanical thrombectomy with DES placement in 03/2022 for late stent thrombosis, HFrEF, PSVT s/p prior EP study with no inducible arrhythmia, fibromyalgia, hypertension, hyperlipidemia, tobacco abuse, anxiety, and hypokalemia who presents for***    Patient presented 06/2021 with chest pain and was found to have inferior STEMI with emergent cardiac catheterization showing thrombotic subtotal occlusion of the right proximal RCA which was treated successfully with PCI and DES placement.  There was mild nonobstructive disease of affecting the left coronary arteries.  Echo showed EF 50 to 55%.  Brilinta  was switched to clopidogrel  due to persistent dyspnea.  Patient had recurrent chest pain prompting multiple ED visits.  Repeat echo 08/2021 showed EF of 35%.  Given newly reduced EF and ongoing symptoms, patient underwent right and left heart catheterization 11/2021 which showed patent RCA stent with mild in-stent restenosis and no evidence of obstructive disease affecting the left coronary arteries.  EF was 35%.  Right heart catheterization showed mildly to moderately elevated filling pressures, minimal pulmonary hypertension and normal cardiac output.  She presented 03/2022 with recurrent myocardial infarction.  Cardiac cath showed occluded RCA stent due to late stent thrombosis.  She underwent successful mechanical thrombectomy and DES placement.  She was switched from clopidogrel  to prasugrel .  Genotype testing subsequently showed an intermediate 2C-19 metabolic activity.  Echo showed EF 35 to 40%.  She had recurrent chest pain 06/2022 and underwent repeat left heart  catheterization which showed widely patent RCA stents with no significant restenosis, mildly reduced LV systolic function with EF of 45 to 50% with normal LV end-diastolic pressure.  Echo 03/2023 showed EF of 40 to 45%.  She was evaluated in the ED 11/2023 with progressive shortness of breath, dyspnea on exertion, weight gain, lower extremity swelling, and right-sided chest pain.  Potassium was low and troponin was negative x 2.  No acute EKG changes.  She was treated with IV diuresis and breathing treatments for COPD and CHF.  Chest pain was reproducible and suspected to be musculoskeletal in etiology.  She was discharged with recommendation to double her dose of Lasix  for 2 days with a steroid burst.  Seen in the cardiology clinic for ED follow-up reporting overall feeling better.  She attributed her chest tightness anxiety.  Lower extremity swelling and dyspnea had improved but she reported abdominal fullness remained.  Lasix  was increased to 40 mg daily with subsequent increase in potassium to 80 mill equivalents.  Repeat echo was ordered and obtained 02/05/2024 which revealed EF slightly improved to 45 to 50% with with RWMA, mild LVH, G1 DD.   Patient was most recently seen by myself in the office 02/13/2024 overall doing well from a cardiac perspective.  She reported improvement in swelling with uptitration of Lasix .  She reported intermittent episodes of chest pressure as if something was sitting on her chest.  She reported that this occurred randomly both with exertion and at rest.  It did not limit her activity and she suspected it could be due to anxiety, although worried that it may be related to her heart.  Lexiscan Myoview was ordered but not completed after  patient canceled or rescheduled multiple times.  Patient was seen in the emergency department 05/06/2024 overall not feeling well since the 24th.  She reported cough, body ache, chills, and chest tightness.  Vitals overall unremarkable.  QT noted  to be prolonged on EKG.  Potassium was low at 3.1.  Troponin negative.  It was suspected that she most likely had influenza, although testing was not available at the time.  She was discharged with recommendation for symptomatic treatment and close outpatient follow-up with primary care.  ***  Labs independently reviewed: 05/06/2024-Hgb 15.7, HCT 47.5, sodium 139, potassium 3.1, BUN less than 5, creatinine 0.92 11/2023-TC 153, TG 134, HDL 43, LDL 83   Objective   Past Medical History:  Diagnosis Date   Acute ethmoidal sinusitis    Acute ethmoidal sinusitis    Acute maxillary sinusitis    Acute upper respiratory infections of unspecified site    Anxiety state, unspecified    Chest pain, unspecified    CHF (congestive heart failure) (HCC)    Coronary artery disease    Depression    Diarrhea    Esophageal reflux    Generalized anxiety disorder    Hyperlipidemia    Hypertension    Hypoglycemia, unspecified    Lumbago    Nausea with vomiting    Obstructive chronic bronchitis with exacerbation (HCC)    Other and unspecified hyperlipidemia    Other and unspecified peripheral vertigo(386.19)    Other bursitis disorders    Other malaise and fatigue    Other specified diseases due to viruses    Other vitamin B12 deficiency anemia    Pain in limb    Pernicious anemia    Sleep related hypoventilation/hypoxemia in conditions classifiable elsewhere    SVT (supraventricular tachycardia)    Swelling, mass, or lump in head and neck    Tobacco use disorder    Unspecified disorder of external ear    Unspecified disorder of skin and subcutaneous tissue    Unspecified otitis media     Current Medications: Active Medications[1]  Allergies:   Buspirone, Citalopram, Metrizamide, Sulfa antibiotics, Ceftin  [cefuroxime ], Contrast media [iodinated contrast media], Sulfa drugs cross reactors, and Sulfasalazine   Social History   Socioeconomic History   Marital status: Single    Spouse name:  Not on file   Number of children: Not on file   Years of education: Not on file   Highest education level: Not on file  Occupational History   Not on file  Tobacco Use   Smoking status: Every Day    Current packs/day: 0.00    Types: Cigarettes    Start date: 03/13/1992    Last attempt to quit: 03/13/2022    Years since quitting: 2.1   Smokeless tobacco: Never   Tobacco comments:    Has been smoking for 30 years Wants to quit after she gets past all the heart stuff and changes in life.  Smoking 3 a day right now  Vaping Use   Vaping status: Never Used  Substance and Sexual Activity   Alcohol use: No   Drug use: Never   Sexual activity: Not Currently  Other Topics Concern   Not on file  Social History Narrative   Lives with fiance, Pervis.   Social Drivers of Health   Tobacco Use: High Risk (05/06/2024)   Patient History    Smoking Tobacco Use: Every Day    Smokeless Tobacco Use: Never    Passive Exposure: Not on file  Financial  Resource Strain: Low Risk  (03/04/2024)   Received from [facility]   Overall Financial Resource Strain (CARDIA)    Difficulty of Paying Living Expenses: Not hard at all  Food Insecurity: No Food Insecurity (03/04/2024)   Received from [facility]   Epic    Within the past 12 months, you worried that your food would run out before you got the money to buy more.: Never true    Within the past 12 months, the food you bought just didn't last and you didn't have money to get more.: Never true  Transportation Needs: No Transportation Needs (03/04/2024)   Received from [facility] - Transportation    In the past 12 months, has lack of transportation kept you from medical appointments or from getting medications?: No    Lack of Transportation (Non-Medical): No  Physical Activity: Inactive (03/26/2023)   Received from [facility]   Exercise Vital Sign    On average, how  many days per week do you engage in moderate to strenuous exercise (like a brisk walk)?: 0 days    On average, how many minutes do you engage in exercise at this level?: 0 min  Stress: Stress Concern Present (03/26/2023)   Received from [facility] - Occupational Stress Questionnaire    Feeling of Stress : Very much  Social Connections: Socially Isolated (03/26/2023)   Received from [facility]   Social Connection and Isolation Panel    In a typical week, how many times do you talk on the phone with family, friends, or neighbors?: More than three times a week    How often do you get together with friends or relatives?: More than three times a week    How often do you attend church or religious services?: Never    Do you belong to any clubs or organizations such as church groups, unions, fraternal or athletic groups, or school groups?: No    How often do you attend meetings of the clubs or organizations you belong to?: Never    Are you married, widowed, divorced, separated, never married, or living with a partner?: Never married  Depression (PHQ2-9): High Risk (07/27/2021)   Depression (PHQ2-9)    PHQ-2 Score: 12  Alcohol Screen: Not on file  Housing: Low Risk  (03/04/2024)   Received from [facility]   Epic    In the last 12 months, was there a time when you were not able to pay the mortgage or rent on time?: No    In the past 12 months, how many times have you moved where you were living?: 0    At any time in the past 12 months, were you homeless or living in a shelter (including now)?: No  Utilities: Not At Risk (03/04/2024)   Received from [facility]   Epic    In the past 12 months has the electric, gas, oil, or water  company threatened to shut off services in your home?: No  Health Literacy: Adequate Health Literacy (03/26/2023)   Received from [facility]   B1300 Health Literacy    Frequency of need for help with medical instructions: Never     Family History:  The patient's family history includes Breast cancer in her maternal grandmother; Cancer in her father and paternal grandfather; Depression in her mother; Hypertension in her  sister.  ROS:   12-point review of systems is negative unless otherwise noted in the HPI.  EKGs/Other Studies Reviewed:    Studies reviewed were summarized above. The additional studies were reviewed today:  02/05/2024 Echo complete 1. Left ventricular ejection fraction, by estimation, is 45 to 50%. The left ventricle has mildly decreased function. The left ventricle demonstrates regional wall motion abnormalities (see scoring diagram/findings for description). There is mild left ventricular hypertrophy. Left ventricular diastolic parameters are consistent with Grade I diastolic dysfunction (impaired relaxation).   2. Right ventricular systolic function is low normal. The right ventricular size is normal.   3. The mitral valve is normal in structure. Trivial mitral valve regurgitation. No evidence of mitral stenosis.   4. The aortic valve was not well visualized. Aortic valve regurgitation is not visualized. No aortic stenosis is present.   5. The inferior vena cava is normal in size with greater than 50% respiratory variability, suggesting right atrial pressure of 3 mmHg.   EKG:  EKG personally reviewed by me today    PHYSICAL EXAM:    VS:  There were no vitals taken for this visit.  BMI: There is no height or weight on file to calculate BMI.  GEN: Well nourished, well developed in no acute distress NECK: No JVD; No carotid bruits CARDIAC: ***RRR, no murmurs, rubs, gallops RESPIRATORY:  Clear to auscultation without rales, wheezing or rhonchi  ABDOMEN: Soft, non-tender, non-distended EXTREMITIES:  *** No edema; No deformity  Wt Readings from Last 3 Encounters:  05/06/24 179 lb 14.3 oz (81.6 kg)   03/25/24 180 lb (81.6 kg)  02/13/24 183 lb 2 oz (83.1 kg)                  ASSESSMENT & PLAN:   Chronic systolic heart failure Ischemic cardiomyopathy   Coronary artery disease   Paroxysmal SVT   Hyperlipidemia   Tobacco use   Obesity   {Are you ordering a CV Procedure (e.g. stress test, cath, DCCV, TEE, etc)?   Press F2        :[id number]}   Disposition: F/u with Dr. Darron or an APP in ***.   Medication Adjustments/Labs and Tests Ordered: Current medicines are reviewed at length with the patient today.  Concerns regarding medicines are outlined above. Medication changes, Labs and Tests ordered today are summarized above and listed in the Patient Instructions accessible in Encounters.   Bonney Lesley Maffucci, PA-C 05/07/2024 12:31 PM     [facility] [place] [address] [place], [place] [address] [phone]      [1]  No outpatient medications have been marked as taking for the 05/09/24 encounter (Appointment) with Maffucci Lesley CROME, PA-C.   "

## 2024-05-07 NOTE — Progress Notes (Signed)
 Medicaid Adult Rising Risk (Nurse) 05/07/24 (Wednesday) Rec'd adt via email Recent ED discharge Location: [facility] Discharge date: 05/06/2024. [facility] - Patient Enrolled  This serves as confirmation that your patient, Lisa Davila, has enrolled in [place].  Lisa Davila was identified by admission/discharge/transfer alert as a candidate for [facility] Rising Risk Care Management service(s).  An appointment has been scheduled for Lisa Davila on May 16, 2024 with a [facility], Bari Fair. Lisa Davila    [address]; [place], [place] [address] l  [email or web address] l [phone]   For more information on [facility] services, click here.

## 2024-05-08 NOTE — Telephone Encounter (Signed)
 Enzymes were considered negative.  Potassium levels were low but will need to be rechecked.  Will likely need an increased amount of potassium supplements until level is back to normal.  Your potassium level needs to be closer to 4.  Keep your previously scheduled appointment with [facility] 05/09/2024 for repeat EKG and updated labs.

## 2024-05-09 ENCOUNTER — Encounter: Payer: Self-pay | Admitting: Physician Assistant

## 2024-05-09 ENCOUNTER — Ambulatory Visit: Admitting: Physician Assistant

## 2024-05-09 ENCOUNTER — Other Ambulatory Visit: Payer: Self-pay

## 2024-05-09 ENCOUNTER — Other Ambulatory Visit (HOSPITAL_COMMUNITY): Payer: Self-pay

## 2024-05-09 VITALS — BP 118/80 | HR 99 | Ht 61.0 in | Wt 183.0 lb

## 2024-05-09 DIAGNOSIS — R9431 Abnormal electrocardiogram [ECG] [EKG]: Secondary | ICD-10-CM

## 2024-05-09 DIAGNOSIS — I25112 Atherosclerotic heart disease of native coronary artery with refractory angina pectoris: Secondary | ICD-10-CM

## 2024-05-09 DIAGNOSIS — I255 Ischemic cardiomyopathy: Secondary | ICD-10-CM | POA: Diagnosis not present

## 2024-05-09 DIAGNOSIS — E785 Hyperlipidemia, unspecified: Secondary | ICD-10-CM | POA: Diagnosis not present

## 2024-05-09 DIAGNOSIS — Z720 Tobacco use: Secondary | ICD-10-CM

## 2024-05-09 DIAGNOSIS — I471 Supraventricular tachycardia, unspecified: Secondary | ICD-10-CM

## 2024-05-09 DIAGNOSIS — I5022 Chronic systolic (congestive) heart failure: Secondary | ICD-10-CM

## 2024-05-09 MED ORDER — NEXLIZET 180-10 MG PO TABS
1.0000 | ORAL_TABLET | Freq: Every day | ORAL | 3 refills | Status: DC
  Filled 2024-05-09 (×2): qty 90, 90d supply, fill #0

## 2024-05-09 MED ORDER — NITROGLYCERIN 0.4 MG SL SUBL
0.4000 mg | SUBLINGUAL_TABLET | SUBLINGUAL | 0 refills | Status: AC | PRN
  Filled 2024-05-09: qty 50, 17d supply, fill #0
  Filled 2024-05-09: qty 50, 30d supply, fill #0

## 2024-05-09 NOTE — Patient Instructions (Signed)
 Medication Instructions:  Your physician recommends the following medication changes.  START TAKING: NEXLIZET  1 TIME DAILY   Lab Work: Your provider would like for you to have following labs drawn today BMET.   If you have labs (blood work) drawn today and your tests are completely normal, you will receive your results only by: MyChart Message (if you have MyChart) OR A paper copy in the mail If you have any lab test that is abnormal or we need to change your treatment, we will call you to review the results.  Testing/Procedures: Your provider has ordered a Lexiscan/ Exercise Myoview Stress test. This will take place at [facility]. Please report to the [facility] medical mall entrance. The volunteers at the first desk will direct you where to go.  [facility] MYOVIEW  Your provider has ordered a Stress Test with nuclear imaging. The purpose of this test is to evaluate the blood supply to your heart muscle. This procedure is referred to as a Non-Invasive Stress Test. This is because other than having an IV started in your vein, nothing is inserted or invades your body. Cardiac stress tests are done to find areas of poor blood flow to the heart by determining the extent of coronary artery disease (CAD). Some patients exercise on a treadmill, which naturally increases the blood flow to your heart, while others who are unable to walk on a treadmill due to physical limitations will have a pharmacologic/chemical stress agent called Lexiscan . This medicine will mimic walking on a treadmill by temporarily increasing your coronary blood flow.   Please note: these test may take anywhere between 2-4 hours to complete  How to prepare for your Myoview test:  Nothing to eat for 6 hours prior to the test No caffeine for 24 hours prior to test No smoking 24 hours prior to test. Your medication may be taken with water .  If your doctor stopped a medication because of this test, do not take that medication. Ladies,  please do not wear dresses.  Skirts or pants are appropriate. Please wear a short sleeve shirt. No perfume, cologne or lotion. Wear comfortable walking shoes. No heels!   PLEASE NOTIFY THE OFFICE AT LEAST 24 HOURS IN ADVANCE IF YOU ARE UNABLE TO KEEP YOUR APPOINTMENT.  [phone] AND  PLEASE NOTIFY NUCLEAR MEDICINE AT [facility] AT LEAST 24 HOURS IN ADVANCE IF YOU ARE UNABLE TO KEEP YOUR APPOINTMENT. [phone]   Follow-Up: At [facility], you and your health needs are our priority.  As part of our continuing mission to provide you with exceptional heart care, our providers are all part of one team.  This team includes your primary Cardiologist (physician) and Advanced Practice Providers or APPs (Physician Assistants and Nurse Practitioners) who all work together to provide you with the care you need, when you need it.  Your next appointment:   3 month(s)  Provider:   You may see Deatrice Cage, MD or one of the following Advanced Practice Providers on your designated Care Team:   Lonni Meager, NP Lesley Maffucci, PA-C Bernardino Bring, PA-C Cadence [place], PA-C Tylene Lunch, NP Barnie Hila, NP

## 2024-05-10 LAB — BASIC METABOLIC PANEL WITH GFR
BUN/Creatinine Ratio: 4 — ABNORMAL LOW (ref 9–23)
BUN: 4 mg/dL — ABNORMAL LOW (ref 6–24)
CO2: 25 mmol/L (ref 20–29)
Calcium: 9.9 mg/dL (ref 8.7–10.2)
Chloride: 103 mmol/L (ref 96–106)
Creatinine, Ser: 0.95 mg/dL (ref 0.57–1.00)
Glucose: 78 mg/dL (ref 70–99)
Potassium: 4.1 mmol/L (ref 3.5–5.2)
Sodium: 144 mmol/L (ref 134–144)
eGFR: 74 mL/min/1.73

## 2024-05-12 ENCOUNTER — Other Ambulatory Visit: Payer: Self-pay

## 2024-05-12 ENCOUNTER — Ambulatory Visit: Payer: Self-pay | Admitting: Physician Assistant

## 2024-05-12 MED ORDER — AMOXICILLIN-POT CLAVULANATE 875-125 MG PO TABS
1.0000 | ORAL_TABLET | Freq: Two times a day (BID) | ORAL | 0 refills | Status: AC
  Filled 2024-05-12: qty 14, 7d supply, fill #0

## 2024-05-12 MED ORDER — PREDNISONE 10 MG PO TABS
ORAL_TABLET | ORAL | 0 refills | Status: AC
  Filled 2024-05-12: qty 21, 6d supply, fill #0

## 2024-05-13 ENCOUNTER — Other Ambulatory Visit (HOSPITAL_COMMUNITY): Payer: Self-pay

## 2024-05-20 ENCOUNTER — Encounter: Admission: RE | Admit: 2024-05-20

## 2024-05-20 ENCOUNTER — Other Ambulatory Visit

## 2024-05-26 ENCOUNTER — Other Ambulatory Visit: Payer: Self-pay

## 2024-05-26 ENCOUNTER — Other Ambulatory Visit (HOSPITAL_COMMUNITY): Payer: Self-pay

## 2024-05-26 MED ORDER — ROSUVASTATIN CALCIUM 40 MG PO TABS
40.0000 mg | ORAL_TABLET | Freq: Every day | ORAL | 3 refills | Status: AC
  Filled 2024-05-26 (×2): qty 90, 90d supply, fill #0

## 2024-05-28 ENCOUNTER — Other Ambulatory Visit: Payer: Self-pay

## 2024-05-28 MED ORDER — ALPRAZOLAM 0.5 MG PO TABS
0.5000 mg | ORAL_TABLET | Freq: Three times a day (TID) | ORAL | 0 refills | Status: AC | PRN
  Filled 2024-05-28: qty 90, 30d supply, fill #0

## 2024-05-30 ENCOUNTER — Other Ambulatory Visit: Payer: Self-pay

## 2024-05-30 MED ORDER — FLUCONAZOLE 100 MG PO TABS
100.0000 mg | ORAL_TABLET | Freq: Every day | ORAL | 0 refills | Status: AC
  Filled 2024-05-30: qty 10, 10d supply, fill #0

## 2024-06-01 ENCOUNTER — Other Ambulatory Visit (HOSPITAL_COMMUNITY): Payer: Self-pay

## 2024-06-01 ENCOUNTER — Other Ambulatory Visit: Payer: Self-pay

## 2024-06-02 ENCOUNTER — Other Ambulatory Visit (HOSPITAL_COMMUNITY): Payer: Self-pay

## 2024-06-03 ENCOUNTER — Other Ambulatory Visit: Payer: Self-pay

## 2024-06-03 ENCOUNTER — Ambulatory Visit

## 2024-06-04 ENCOUNTER — Other Ambulatory Visit (HOSPITAL_COMMUNITY): Payer: Self-pay

## 2024-06-10 MED ORDER — VARENICLINE TARTRATE (STARTER) 0.5 MG X 11 & 1 MG X 42 PO TBPK: ORAL_TABLET | ORAL | 0 refills | Status: DC

## 2024-06-10 MED ORDER — VARENICLINE TARTRATE (STARTER) 0.5 MG X 11 & 1 MG X 42 PO TBPK: ORAL_TABLET | ORAL | 0 refills | Status: AC

## 2024-07-01 ENCOUNTER — Ambulatory Visit: Admitting: Cardiovascular Disease
